# Patient Record
Sex: Male | Born: 1935 | Race: White | Hispanic: No | Marital: Married | Smoking: Former smoker
Health system: Southern US, Community
[De-identification: ages and names within clinical notes are randomized; demographics above are authoritative.]

## PROBLEM LIST (undated history)

## (undated) DIAGNOSIS — K5100 Ulcerative (chronic) pancolitis without complications: Secondary | ICD-10-CM

## (undated) DIAGNOSIS — B259 Cytomegaloviral disease, unspecified: Secondary | ICD-10-CM

## (undated) DIAGNOSIS — E1165 Type 2 diabetes mellitus with hyperglycemia: Secondary | ICD-10-CM

## (undated) DIAGNOSIS — K219 Gastro-esophageal reflux disease without esophagitis: Secondary | ICD-10-CM

## (undated) DIAGNOSIS — Z9289 Personal history of other medical treatment: Secondary | ICD-10-CM

## (undated) DIAGNOSIS — I10 Essential (primary) hypertension: Secondary | ICD-10-CM

## (undated) DIAGNOSIS — R51 Headache: Secondary | ICD-10-CM

## (undated) DIAGNOSIS — R5383 Other fatigue: Secondary | ICD-10-CM

## (undated) DIAGNOSIS — IMO0001 Reserved for inherently not codable concepts without codable children: Secondary | ICD-10-CM

## (undated) DIAGNOSIS — D696 Thrombocytopenia, unspecified: Secondary | ICD-10-CM

## (undated) DIAGNOSIS — G4733 Obstructive sleep apnea (adult) (pediatric): Secondary | ICD-10-CM

## (undated) DIAGNOSIS — I499 Cardiac arrhythmia, unspecified: Secondary | ICD-10-CM

## (undated) DIAGNOSIS — R0600 Dyspnea, unspecified: Secondary | ICD-10-CM

## (undated) DIAGNOSIS — E782 Mixed hyperlipidemia: Secondary | ICD-10-CM

## (undated) DIAGNOSIS — Z950 Presence of cardiac pacemaker: Secondary | ICD-10-CM

## (undated) DIAGNOSIS — J849 Interstitial pulmonary disease, unspecified: Secondary | ICD-10-CM

## (undated) DIAGNOSIS — R5381 Other malaise: Secondary | ICD-10-CM

## (undated) DIAGNOSIS — Z860100 Personal history of colon polyps, unspecified: Secondary | ICD-10-CM

## (undated) DIAGNOSIS — A0839 Other viral enteritis: Secondary | ICD-10-CM

## (undated) HISTORY — DX: Reserved for inherently not codable concepts without codable children: IMO0001

## (undated) HISTORY — DX: Other fatigue: R53.83

## (undated) HISTORY — DX: Mixed hyperlipidemia: E78.2

## (undated) HISTORY — DX: Essential (primary) hypertension: I10

## (undated) HISTORY — DX: Headache: R51

## (undated) HISTORY — DX: Other malaise: R53.81

## (undated) HISTORY — DX: Obstructive sleep apnea (adult) (pediatric): G47.33

## (undated) HISTORY — DX: Ulcerative (chronic) pancolitis without complications: K51.00

## (undated) HISTORY — DX: Personal history of colon polyps, unspecified: Z86.0100

## (undated) HISTORY — DX: Type 2 diabetes mellitus with hyperglycemia: E11.65

## (undated) HISTORY — DX: Personal history of other medical treatment: Z92.89

## (undated) HISTORY — DX: Personal history of colonic polyps: Z86.010

---
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
## 1995-11-11 HISTORY — PX: HERNIA REPAIR: SHX51

## 1999-11-11 HISTORY — PX: ANKLE SURGERY: SHX546

## 1999-11-11 HISTORY — PX: CHOLECYSTECTOMY: SHX55

## 2008-02-10 ENCOUNTER — Ambulatory Visit: Payer: Self-pay | Admitting: Cardiology

## 2009-11-10 LAB — HM DIABETES FOOT EXAM

## 2009-11-10 LAB — HM DIABETES EYE EXAM

## 2011-01-01 ENCOUNTER — Ambulatory Visit (INDEPENDENT_AMBULATORY_CARE_PROVIDER_SITE_OTHER): Payer: Medicare Other | Admitting: Internal Medicine

## 2011-01-01 DIAGNOSIS — K5190 Ulcerative colitis, unspecified, without complications: Secondary | ICD-10-CM

## 2011-07-18 ENCOUNTER — Telehealth: Payer: Self-pay | Admitting: Endocrinology

## 2011-07-18 NOTE — Telephone Encounter (Signed)
Received copies from Dr. Quillian Quince at [facility]. on 07/18/2011. Forwarded  7pages to Dr. Loanne Drilling for review.

## 2011-08-18 ENCOUNTER — Ambulatory Visit (INDEPENDENT_AMBULATORY_CARE_PROVIDER_SITE_OTHER): Payer: Medicare Other | Admitting: Endocrinology

## 2011-08-18 ENCOUNTER — Encounter: Payer: Self-pay | Admitting: Endocrinology

## 2011-08-18 ENCOUNTER — Ambulatory Visit (INDEPENDENT_AMBULATORY_CARE_PROVIDER_SITE_OTHER)
Admission: RE | Admit: 2011-08-18 | Discharge: 2011-08-18 | Disposition: A | Discharge status: Home / Self Care | Payer: Medicare Other | Source: Ambulatory Visit | Attending: Endocrinology | Admitting: Endocrinology

## 2011-08-18 DIAGNOSIS — R0989 Other specified symptoms and signs involving the circulatory and respiratory systems: Secondary | ICD-10-CM

## 2011-08-18 DIAGNOSIS — R0689 Other abnormalities of breathing: Secondary | ICD-10-CM | POA: Insufficient documentation

## 2011-08-18 DIAGNOSIS — F068 Other specified mental disorders due to known physiological condition: Secondary | ICD-10-CM | POA: Insufficient documentation

## 2011-08-18 DIAGNOSIS — F0789 Other personality and behavioral disorders due to known physiological condition: Secondary | ICD-10-CM
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
## 2011-08-18 NOTE — Patient Instructions (Addendum)
good diet and exercise habits significanly improve the control of your diabetes.  please keep your appointment with the dietician.  high blood sugar is very risky to your health.  you should see an eye doctor every year.  Weight loss helps your liver, also.   controlling your blood pressure and cholesterol drastically reduces the damage diabetes does to your body.  this also applies to quitting smoking.  please discuss these with your doctor.  you should take an aspirin every day, unless you have been advised by a doctor not to. check your blood sugar 1 time a day.  vary the time of day when you check, between before the 3 meals, and at bedtime.  also check if you have symptoms of your blood sugar being too high or too low.  please keep a record of the readings and bring it to your next appointment here.  please call us sooner if you are having low blood sugar episodes, or if it stays over 200. For now, continue the same metformin.   A chest x-ray is being requested for you today.  please call [phone] to hear your test results.  You will be prompted to enter the 9-digit "MRN" number that appears at the top left of this page, followed by #.  Then you will hear the message.  Please come back for a follow-up appointment in 3 months.   Your testosterone is only mildly low.  We could check blood tests to evaluate it further, but you could also just recheck it in the future. (update: i left message on phone-tree:  You should see your lung specialist, to f/u abnl cxr).

## 2011-08-20 ENCOUNTER — Encounter: Payer: Self-pay | Admitting: Endocrinology

## 2011-08-20 DIAGNOSIS — K5100 Ulcerative (chronic) pancolitis without complications: Secondary | ICD-10-CM | POA: Insufficient documentation

## 2011-08-20 DIAGNOSIS — E782 Mixed hyperlipidemia: Secondary | ICD-10-CM | POA: Insufficient documentation

## 2011-08-20 DIAGNOSIS — I10 Essential (primary) hypertension: Secondary | ICD-10-CM | POA: Insufficient documentation

## 2011-08-20 DIAGNOSIS — E1169 Type 2 diabetes mellitus with other specified complication: Secondary | ICD-10-CM | POA: Insufficient documentation

## 2011-11-18 ENCOUNTER — Encounter: Payer: Self-pay | Admitting: Endocrinology

## 2011-11-18 ENCOUNTER — Ambulatory Visit (INDEPENDENT_AMBULATORY_CARE_PROVIDER_SITE_OTHER): Payer: Medicare Other | Admitting: Endocrinology

## 2011-11-18 NOTE — Patient Instructions (Addendum)
check your blood sugar 1 time a day.  vary the time of day when you check, between before the 3 meals, and at bedtime.  also check if you have symptoms of your blood sugar being too high or too low.  please keep a record of the readings and bring it to your next appointment here.  please call us sooner if you are having low blood sugar episodes, or if it stays over 200. For now, continue metformin 1000 mg daily.   Please come back for a follow-up appointment in 6 months.   Losing weight, or taking the diabetes medication "actos" will heal your liver.   Please inquire at our x-ray dept about getting a CD of the chest-x-ray you had the last time you were here. Cc dr byrd ([facility]).  Here are some samples of "staxyin," to take as needed for ED symptoms.
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
## 2011-11-21 DIAGNOSIS — E119 Type 2 diabetes mellitus without complications: Secondary | ICD-10-CM | POA: Diagnosis not present

## 2012-03-02 DIAGNOSIS — N400 Enlarged prostate without lower urinary tract symptoms: Secondary | ICD-10-CM | POA: Diagnosis not present

## 2012-03-02 DIAGNOSIS — I10 Essential (primary) hypertension: Secondary | ICD-10-CM | POA: Diagnosis not present

## 2012-03-02 DIAGNOSIS — E782 Mixed hyperlipidemia: Secondary | ICD-10-CM | POA: Diagnosis not present

## 2012-03-02 DIAGNOSIS — IMO0001 Reserved for inherently not codable concepts without codable children: Secondary | ICD-10-CM | POA: Diagnosis not present

## 2012-03-02 DIAGNOSIS — R5381 Other malaise: Secondary | ICD-10-CM | POA: Diagnosis not present

## 2012-03-09 DIAGNOSIS — G4733 Obstructive sleep apnea (adult) (pediatric): Secondary | ICD-10-CM | POA: Diagnosis not present

## 2012-03-09 DIAGNOSIS — R5383 Other fatigue: Secondary | ICD-10-CM | POA: Diagnosis not present

## 2012-03-09 DIAGNOSIS — E119 Type 2 diabetes mellitus without complications: Secondary | ICD-10-CM | POA: Diagnosis not present

## 2012-03-09 DIAGNOSIS — I10 Essential (primary) hypertension: Secondary | ICD-10-CM | POA: Diagnosis not present

## 2012-03-09 DIAGNOSIS — E782 Mixed hyperlipidemia: Secondary | ICD-10-CM | POA: Diagnosis not present

## 2012-03-09 DIAGNOSIS — K5100 Ulcerative (chronic) pancolitis without complications: Secondary | ICD-10-CM | POA: Diagnosis not present

## 2012-03-13 DIAGNOSIS — B079 Viral wart, unspecified: Secondary | ICD-10-CM | POA: Diagnosis not present

## 2012-04-07 ENCOUNTER — Encounter (INDEPENDENT_AMBULATORY_CARE_PROVIDER_SITE_OTHER): Payer: Self-pay | Admitting: Internal Medicine

## 2012-04-07 ENCOUNTER — Ambulatory Visit (INDEPENDENT_AMBULATORY_CARE_PROVIDER_SITE_OTHER): Payer: Medicare Other | Admitting: Internal Medicine

## 2012-04-07 VITALS — BP 114/60 | HR 72 | Temp 98.1°F | Ht 72.0 in | Wt 208.5 lb

## 2012-04-07 DIAGNOSIS — K5190 Ulcerative colitis, unspecified, without complications: Secondary | ICD-10-CM | POA: Diagnosis not present

## 2012-04-07 MED ORDER — MESALAMINE 1000 MG RE SUPP: 1000.0000 mg | Freq: Every day | RECTAL | Status: DC

## 2012-04-07 NOTE — Patient Instructions (Addendum)
Canasa supp 1 gm x 30 day.  OV 1 yr. PR in 2 weeks.  CBC, sedrate today
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
## 2012-04-08 LAB — CBC WITH DIFFERENTIAL/PLATELET
Basophils Absolute: 0.1 10*3/uL (ref 0.0–0.1)
Basophils Relative: 1 % (ref 0–1)
Eosinophils Absolute: 0.1 10*3/uL (ref 0.0–0.7)
MCH: 31.9 pg (ref 26.0–34.0)
MCHC: 34.4 g/dL (ref 30.0–36.0)
Monocytes Absolute: 0.7 10*3/uL (ref 0.1–1.0)
Neutro Abs: 3 10*3/uL (ref 1.7–7.7)
Neutrophils Relative %: 51 % (ref 43–77)
RDW: 13.3 % (ref 11.5–15.5)

## 2012-04-08 LAB — C-REACTIVE PROTEIN: CRP: 0.23 mg/dL (ref <0.60)

## 2012-04-13 ENCOUNTER — Telehealth (INDEPENDENT_AMBULATORY_CARE_PROVIDER_SITE_OTHER): Payer: Self-pay | Admitting: Internal Medicine

## 2012-04-13 DIAGNOSIS — K5120 Ulcerative (chronic) proctitis without complications: Secondary | ICD-10-CM

## 2012-04-13 MED ORDER — MESALAMINE 1.2 G PO TBEC: DELAYED_RELEASE_TABLET | ORAL | Status: DC

## 2012-04-13 NOTE — Telephone Encounter (Signed)
Please see Rx

## 2012-04-19 DIAGNOSIS — E1149 Type 2 diabetes mellitus with other diabetic neurological complication: Secondary | ICD-10-CM | POA: Diagnosis not present

## 2012-04-19 DIAGNOSIS — E119 Type 2 diabetes mellitus without complications: Secondary | ICD-10-CM | POA: Diagnosis not present

## 2012-04-28 DIAGNOSIS — R0602 Shortness of breath: Secondary | ICD-10-CM | POA: Diagnosis not present

## 2012-04-28 DIAGNOSIS — G4733 Obstructive sleep apnea (adult) (pediatric): Secondary | ICD-10-CM | POA: Diagnosis not present

## 2012-05-18 ENCOUNTER — Encounter (INDEPENDENT_AMBULATORY_CARE_PROVIDER_SITE_OTHER): Payer: Self-pay | Admitting: Internal Medicine

## 2012-05-18 ENCOUNTER — Ambulatory Visit (INDEPENDENT_AMBULATORY_CARE_PROVIDER_SITE_OTHER): Payer: Medicare Other | Admitting: Internal Medicine

## 2012-05-18 ENCOUNTER — Ambulatory Visit (INDEPENDENT_AMBULATORY_CARE_PROVIDER_SITE_OTHER): Payer: Medicare Other | Admitting: Endocrinology

## 2012-05-18 ENCOUNTER — Other Ambulatory Visit (INDEPENDENT_AMBULATORY_CARE_PROVIDER_SITE_OTHER): Payer: Self-pay | Admitting: *Deleted

## 2012-05-18 ENCOUNTER — Encounter: Payer: Self-pay | Admitting: Endocrinology

## 2012-05-18 ENCOUNTER — Telehealth (INDEPENDENT_AMBULATORY_CARE_PROVIDER_SITE_OTHER): Payer: Self-pay | Admitting: *Deleted

## 2012-05-18 VITALS — BP 112/70 | HR 60 | Temp 98.6°F | Ht 70.0 in | Wt 208.7 lb

## 2012-05-18 VITALS — BP 112/68 | HR 77 | Temp 97.7°F | Ht 72.0 in | Wt 210.0 lb

## 2012-05-18 DIAGNOSIS — K5190 Ulcerative colitis, unspecified, without complications: Secondary | ICD-10-CM

## 2012-05-18 DIAGNOSIS — K5120 Ulcerative (chronic) proctitis without complications: Secondary | ICD-10-CM | POA: Diagnosis not present

## 2012-05-18 DIAGNOSIS — Z1211 Encounter for screening for malignant neoplasm of colon: Secondary | ICD-10-CM

## 2012-05-18 MED ORDER — SULFASALAZINE 500 MG PO TABS: 1000.0000 mg | ORAL_TABLET | Freq: Two times a day (BID) | ORAL | Status: DC

## 2012-05-18 MED ORDER — PEG-KCL-NACL-NASULF-NA ASC-C 100 G PO SOLR: 1.0000 | Freq: Once | ORAL | Status: DC

## 2012-05-18 NOTE — Patient Instructions (Addendum)
good diet and exercise habits significanly improve the control of your diabetes.  please let me know if you wish to be referred to a dietician.  high blood sugar is very risky to your health.  you should see an eye doctor every year. controlling your blood pressure and cholesterol drastically reduces the damage diabetes does to your body.  this also applies to quitting smoking.  please discuss these with your doctor.  you should take an aspirin every day, unless you have been advised by a doctor not to. check your blood sugar once a day.  vary the time of day when you check, between before the 3 meals, and at bedtime.  also check if you have symptoms of your blood sugar being too high or too low.  please keep a record of the readings and bring it to your next appointment here.  please call us sooner if your blood sugar goes below 70, or if it stays over 200. Please continue the same metformin.   Please come back for a follow-up appointment in 6 months

## 2012-05-18 NOTE — Telephone Encounter (Signed)
Patient needs movi prep 

## 2012-05-18 NOTE — Patient Instructions (Addendum)
Start Sulfasalazine 54m. 1gm BID. Folic acid 1 mg daily. Will schedule a colonoscopy with Dr. RLaural Golden
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
## 2012-05-18 NOTE — Progress Notes (Signed)
  Subjective:    Patient ID: Manuel Atkins, male    DOB: 31-Oct-1933, 78 y.o.   MRN: [id number]  HPI Pt returns for f/u of tyoe 2 DM (dx'ed 8295; no known complications).  He takes metformin as rx'ed, but it was reduced to 1000 mg qd.  pt states he feels well in general.  he brings a record of his cbg's which i have reviewed today.  It varies from 86-133.  There is no trend throughout the day.   Review of Systems Denies weight change    Objective:   Physical Exam EXTEMITIES: no deformity.  no ulcer on the feet.  feet are of normal color and temp.  1+ bilat leg edema.  There is bilateral onychomycosis.  There is a healed surgical scar at the right ankle.  There are bilat varicosities.  There is bilat rust-colored hyperpigmentation of the leg and feet.  Both great toenails are surgically absent.   PULSES: dorsalis pedis intact bilat.  NEURO:  sensation is intact to touch on the feet.   outside test results are reviewed: A1c=5.8    Assessment & Plan:  DM is well-controlled

## 2012-05-20 ENCOUNTER — Encounter (HOSPITAL_COMMUNITY): Payer: Self-pay | Admitting: Pharmacy Technician

## 2012-05-24 ENCOUNTER — Telehealth (INDEPENDENT_AMBULATORY_CARE_PROVIDER_SITE_OTHER): Payer: Self-pay | Admitting: Internal Medicine

## 2012-05-24 NOTE — Telephone Encounter (Signed)
Rx called to [facility] Hydrocortisone enemas 121m x 14 days. No refills. I could not find this drug to eprescribe.

## 2012-06-02 MED ORDER — SODIUM CHLORIDE 0.45 % IV SOLN
Freq: Once | INTRAVENOUS | Status: AC
  Administered 2012-06-03: 12:00:00 via INTRAVENOUS

## 2012-06-03 ENCOUNTER — Encounter (HOSPITAL_COMMUNITY)
Admission: RE | Disposition: A | Discharge status: Home / Self Care | Payer: Self-pay | Source: Ambulatory Visit | Attending: Internal Medicine

## 2012-06-03 ENCOUNTER — Ambulatory Visit (HOSPITAL_COMMUNITY)
Admission: RE | Admit: 2012-06-03 | Discharge: 2012-06-03 | Disposition: A | Discharge status: Home / Self Care | Payer: Medicare Other | Source: Ambulatory Visit | Attending: Internal Medicine | Admitting: Internal Medicine

## 2012-06-03 ENCOUNTER — Encounter (HOSPITAL_COMMUNITY): Payer: Self-pay | Admitting: *Deleted

## 2012-06-03 DIAGNOSIS — K5730 Diverticulosis of large intestine without perforation or abscess without bleeding: Secondary | ICD-10-CM | POA: Insufficient documentation

## 2012-06-03 DIAGNOSIS — K5190 Ulcerative colitis, unspecified, without complications: Secondary | ICD-10-CM | POA: Diagnosis not present

## 2012-06-03 DIAGNOSIS — K5130 Ulcerative (chronic) rectosigmoiditis without complications: Secondary | ICD-10-CM

## 2012-06-03 DIAGNOSIS — D126 Benign neoplasm of colon, unspecified: Secondary | ICD-10-CM | POA: Diagnosis not present

## 2012-06-03 DIAGNOSIS — E785 Hyperlipidemia, unspecified: Secondary | ICD-10-CM | POA: Insufficient documentation

## 2012-06-03 DIAGNOSIS — K5180 Other ulcerative colitis without complications: Secondary | ICD-10-CM | POA: Insufficient documentation

## 2012-06-03 DIAGNOSIS — K921 Melena: Secondary | ICD-10-CM

## 2012-06-03 DIAGNOSIS — G4733 Obstructive sleep apnea (adult) (pediatric): Secondary | ICD-10-CM | POA: Diagnosis not present

## 2012-06-03 DIAGNOSIS — Z79899 Other long term (current) drug therapy: Secondary | ICD-10-CM | POA: Insufficient documentation

## 2012-06-03 DIAGNOSIS — I10 Essential (primary) hypertension: Secondary | ICD-10-CM | POA: Diagnosis not present

## 2012-06-03 DIAGNOSIS — K644 Residual hemorrhoidal skin tags: Secondary | ICD-10-CM | POA: Diagnosis not present

## 2012-06-03 HISTORY — PX: COLONOSCOPY: SHX5424

## 2012-06-03 LAB — GLUCOSE, CAPILLARY: Glucose-Capillary: 88 mg/dL (ref 70–99)

## 2012-06-03 SURGERY — COLONOSCOPY
Anesthesia: Moderate Sedation

## 2012-06-03 MED ORDER — MEPERIDINE HCL 50 MG/ML IJ SOLN
INTRAMUSCULAR | Status: DC | PRN
  Administered 2012-06-03: 25 mg via INTRAVENOUS

## 2012-06-03 MED ORDER — MEPERIDINE HCL 50 MG/ML IJ SOLN
INTRAMUSCULAR | Status: AC
  Filled 2012-06-03: qty 1

## 2012-06-03 MED ORDER — STERILE WATER FOR IRRIGATION IR SOLN
Status: DC | PRN
  Administered 2012-06-03: 13:00:00

## 2012-06-03 MED ORDER — MIDAZOLAM HCL 5 MG/5ML IJ SOLN
INTRAMUSCULAR | Status: AC
  Filled 2012-06-03: qty 10

## 2012-06-03 MED ORDER — MIDAZOLAM HCL 5 MG/5ML IJ SOLN
INTRAMUSCULAR | Status: DC | PRN
  Administered 2012-06-03: 1 mg via INTRAVENOUS
  Administered 2012-06-03: 2 mg via INTRAVENOUS

## 2012-06-03 MED ORDER — SULFASALAZINE 500 MG PO TABS: 1000.0000 mg | ORAL_TABLET | Freq: Three times a day (TID) | ORAL | Status: DC
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
## 2012-06-03 NOTE — Op Note (Signed)
COLONOSCOPY PROCEDURE REPORT  PATIENT:  Manuel Atkins  MR#:  [id number] Birthdate:  05/27/1933, 78 y.o., male Endoscopist:  Dr. Rogene Houston, MD Referred By:  Dr. Gar Ponto, MD Procedure Date: 06/03/2012  Procedure:   Colonoscopy  Indications:  Patient is 78 year old Caucasian male with over 74 year history of ulcerative colitis was been treated for flareup but symptoms have not improved. Patient's last colonoscopy was 5 years ago. He is undergoing diagnostic/surveillance colonoscopy.  Informed Consent:  The procedure and risks were reviewed with the patient and informed consent was obtained.  Medications:  Demerol 25 mg IV Versed 3 mg IV  Description of procedure:  After a digital rectal exam was performed, that colonoscope was advanced from the anus through the rectum and colon to the area of the cecum, ileocecal valve and appendiceal orifice. The cecum was deeply intubated. These structures were well-seen and photographed for the record. From the level of the cecum and ileocecal valve, the scope was slowly and cautiously withdrawn. The mucosal surfaces were carefully surveyed utilizing scope tip to flexion to facilitate fold flattening as needed. The scope was pulled down into the rectum where a thorough exam including retroflexion was performed. Terminal ileum was also examined.  Findings:   Prep excellent. Normal terminal ileum. Few scattered diverticula at sigmoid and transverse colon. Normal lung and mucosa with transition at 32 cm from the anal margin. 2 large patches with erythematous friable mucosa with erosions at sigmoid colon. Distally there is circumferential involvement of active disease. Double biopsies taken. Small polyp ablated via cold biopsy from sigmoid colon. Normal rectal mucosa. Mall hemorrhoids below the dentate line.  Therapeutic/Diagnostic Maneuvers Performed:  See above  Complications:  None  Cecal Withdrawal Time:  11 minutes  Impression:  Normal  terminal ileum. Active colitis involving distal sigmoid colon with patchy involvement. Multiple biopsies taken. Small sigmoid colon polyp ablated via cold biopsy. Scattered diverticula at sigmoid and transverse colon. Small external hemorrhoids.  Recommendations:  Increase Azulfidine to 1 g by mouth 3 times a day. Continue Hydrocort enemas until finished. On contact patient with biopsy results and further recommendations.  Elyn Krogh U  06/03/2012 1:02 PM  CC: Dr. Gar Ponto, MD & Dr. Rayne Du ref. provider found

## 2012-06-08 ENCOUNTER — Encounter (HOSPITAL_COMMUNITY): Payer: Self-pay | Admitting: Internal Medicine

## 2012-06-14 ENCOUNTER — Other Ambulatory Visit (INDEPENDENT_AMBULATORY_CARE_PROVIDER_SITE_OTHER): Payer: Self-pay | Admitting: Internal Medicine

## 2012-06-16 ENCOUNTER — Encounter (INDEPENDENT_AMBULATORY_CARE_PROVIDER_SITE_OTHER): Payer: Self-pay | Admitting: *Deleted

## 2012-07-05 ENCOUNTER — Telehealth (INDEPENDENT_AMBULATORY_CARE_PROVIDER_SITE_OTHER): Payer: Self-pay | Admitting: Internal Medicine

## 2012-07-05 NOTE — Telephone Encounter (Signed)
C/o passing mucous and blood with his stools. He has had 2 rounds of Hydrocortisone enemas x 14 each. No change in symptoms.  I spoke with Dr. Laural Golden concerning patient.  Will start on Prednisone 10m x 2 weeks, 112mx 1 week, 108m 1 week, 5 mg x 1 week.      Manuel Atkins OV in 5 weeks.

## 2012-07-05 NOTE — Telephone Encounter (Signed)
Has an apt scheduled for 08/10/12 at 11:30 with Dr. Laural Golden. F/U from procedure.

## 2012-07-07 DIAGNOSIS — E782 Mixed hyperlipidemia: Secondary | ICD-10-CM | POA: Diagnosis not present

## 2012-07-07 DIAGNOSIS — IMO0001 Reserved for inherently not codable concepts without codable children: Secondary | ICD-10-CM | POA: Diagnosis not present

## 2012-07-07 DIAGNOSIS — I10 Essential (primary) hypertension: Secondary | ICD-10-CM | POA: Diagnosis not present

## 2012-07-14 DIAGNOSIS — E782 Mixed hyperlipidemia: Secondary | ICD-10-CM | POA: Diagnosis not present

## 2012-07-14 DIAGNOSIS — E119 Type 2 diabetes mellitus without complications: Secondary | ICD-10-CM | POA: Diagnosis not present

## 2012-07-14 DIAGNOSIS — R5381 Other malaise: Secondary | ICD-10-CM | POA: Diagnosis not present

## 2012-07-14 DIAGNOSIS — I10 Essential (primary) hypertension: Secondary | ICD-10-CM | POA: Diagnosis not present

## 2012-07-14 DIAGNOSIS — G4733 Obstructive sleep apnea (adult) (pediatric): Secondary | ICD-10-CM | POA: Diagnosis not present

## 2012-07-14 DIAGNOSIS — R5383 Other fatigue: Secondary | ICD-10-CM | POA: Diagnosis not present

## 2012-07-14 DIAGNOSIS — K5100 Ulcerative (chronic) pancolitis without complications: Secondary | ICD-10-CM | POA: Diagnosis not present

## 2012-08-10 ENCOUNTER — Ambulatory Visit (INDEPENDENT_AMBULATORY_CARE_PROVIDER_SITE_OTHER): Payer: Medicare Other | Admitting: Internal Medicine

## 2012-08-10 ENCOUNTER — Encounter (INDEPENDENT_AMBULATORY_CARE_PROVIDER_SITE_OTHER): Payer: Self-pay | Admitting: Internal Medicine

## 2012-08-10 ENCOUNTER — Other Ambulatory Visit (INDEPENDENT_AMBULATORY_CARE_PROVIDER_SITE_OTHER): Payer: Self-pay | Admitting: Internal Medicine

## 2012-08-10 VITALS — BP 130/76 | HR 82 | Temp 97.6°F | Resp 18 | Ht 72.0 in | Wt 205.1 lb

## 2012-08-10 DIAGNOSIS — K5190 Ulcerative colitis, unspecified, without complications: Secondary | ICD-10-CM

## 2012-08-10 MED ORDER — MESALAMINE ER 0.375 G PO CP24: 375.0000 mg | ORAL_CAPSULE | Freq: Every day | ORAL | Status: DC

## 2012-08-10 MED ORDER — MESALAMINE 800 MG PO TBEC: 1600.0000 mg | DELAYED_RELEASE_TABLET | Freq: Two times a day (BID) | ORAL | Status: DC

## 2012-08-10 MED ORDER — BUDESONIDE 9 MG PO TB24: 9.0000 mg | ORAL_TABLET | Freq: Every day | ORAL | Status: DC

## 2012-08-10 NOTE — Progress Notes (Signed)
Presenting complaint;  Followup for ulcerative colitis.  Subjective:  Patient is 78 year old Caucasian male who has over 30 year history of ulcerative colitis. He has been maintained on sulfasalazine and has done well. His last colonoscopy was in 2008 revealing mild proctitis. He presented in May,2013 with intermittent rectal bleeding. He was treated with Canasa suppositories daily for one month. Rectal bleeding did not stop. He also did not respond to 2 weeks of cort enemas. He underwent colonoscopy on 06/03/2012 which revealed normal terminal ileum active colitis primarily involving distal sigmoid and rectum. He also had diverticula at sigmoid colon and rectum. Biopsy from sigmoid colon revealed typical changes of ulcerative colitis. Sulfasalazine dose was increased. He felt no better after one month. He was therefore begun on prednisone about 5 weeks ago. For a few days he thought his bleeding has stopped but then he was back to square one. He did not like the way he felt while on prednisone as it made him very nervous. He is having 2-3 stools per day. Consistency  Is usually soft. He is passing blood per him treated 4 times each week. He is also having nocturnal bowel movement. At times he passes gas and blood per rectum at night. He denies abdominal pain nausea vomiting fever or chills. He does not take NSAIDs.   Current Medications: Current Outpatient Prescriptions  Medication Sig Dispense Refill  . aspirin EC 81 MG tablet Take 81 mg by mouth daily.      . folic acid (FOLVITE) 1 MG tablet Take 1 mg by mouth daily.      . metFORMIN (GLUCOPHAGE-XR) 500 MG 24 hr tablet Take 500 mg by mouth daily.       Marland Kitchen sulfaSALAzine (AZULFIDINE) 500 MG tablet Take 2 tablets (1,000 mg total) by mouth 3 (three) times daily.  180 tablet  11  . valsartan (DIOVAN) 80 MG tablet Take 80 mg by mouth daily.           Objective: Blood pressure 130/76, pulse 82, temperature 97.6 F (36.4 C), temperature source  Oral, resp. rate 18, height 6' (1.829 m), weight 205 lb 1.6 oz (93.033 kg). Patient is alert and in no acute distress. Conjunctiva is pink. Sclera is nonicteric Oropharyngeal mucosa is normal. No neck masses or thyromegaly noted. Cardiac exam with regular rhythm normal S1 and S2. No murmur or gallop noted. Lungs are clear to auscultation. Abdomen is full but soft and nontender without organomegaly or masses. No LE edema or clubbing noted.  Labs/studies Results: CBC on 04/07/2012. WBC 6.0, H&H 14.5 and 42.2 and platelet count 132K. CRP was 0.23.   Assessment:  Refractory distal ulcerative colitis. He has not responded to sulfasalazine, hydrocortisone enemas as well as to prednisone. He does not appear to be acutely ill. He may eventually need to be on 6 MP but first will try the following.   Plan:  Discontinue sulfasalazine and folic acid. Asacol HD 1.6 g by mouth twice a day. Uceris 9 mg by mouth daily for 8 weeks. Office visit in 8 weeks unless symptoms progress.

## 2012-08-10 NOTE — Patient Instructions (Signed)
Notify if symptoms get worse or he have side effects with medications.

## 2012-08-12 DIAGNOSIS — Z23 Encounter for immunization: Secondary | ICD-10-CM | POA: Diagnosis not present

## 2012-08-16 DIAGNOSIS — E119 Type 2 diabetes mellitus without complications: Secondary | ICD-10-CM | POA: Diagnosis not present

## 2012-08-16 DIAGNOSIS — E1149 Type 2 diabetes mellitus with other diabetic neurological complication: Secondary | ICD-10-CM | POA: Diagnosis not present

## 2012-08-30 ENCOUNTER — Telehealth (INDEPENDENT_AMBULATORY_CARE_PROVIDER_SITE_OTHER): Payer: Self-pay | Admitting: *Deleted

## 2012-08-30 NOTE — Telephone Encounter (Signed)
Manuel Atkins called and states that he saw Dr.Rehman and he was given  Uceris, Apriso., Asacol. He states that with these medications his condition is no better.  He wanted Dr.Rehman to know. His contact number is [phone]

## 2012-08-31 NOTE — Telephone Encounter (Signed)
I talked with patient and his wife yesterday. He is continuing to have rectal bleeding and diarrhea.  He needs to be seen in the office in 2 weeks to review treatment options.

## 2012-09-06 NOTE — Telephone Encounter (Signed)
Apt has been schedule for 09/13/12 with Dr. Laural Golden.

## 2012-09-13 ENCOUNTER — Ambulatory Visit (INDEPENDENT_AMBULATORY_CARE_PROVIDER_SITE_OTHER): Payer: Medicare Other | Admitting: Internal Medicine

## 2012-09-13 ENCOUNTER — Encounter (INDEPENDENT_AMBULATORY_CARE_PROVIDER_SITE_OTHER): Payer: Self-pay | Admitting: Internal Medicine

## 2012-09-13 ENCOUNTER — Other Ambulatory Visit: Payer: Self-pay | Admitting: Cardiology

## 2012-09-13 VITALS — BP 122/76 | HR 80 | Temp 97.4°F | Resp 18 | Ht 72.0 in | Wt 203.9 lb

## 2012-09-13 DIAGNOSIS — R7989 Other specified abnormal findings of blood chemistry: Secondary | ICD-10-CM

## 2012-09-13 DIAGNOSIS — K5120 Ulcerative (chronic) proctitis without complications: Secondary | ICD-10-CM

## 2012-09-13 DIAGNOSIS — K5190 Ulcerative colitis, unspecified, without complications: Secondary | ICD-10-CM | POA: Diagnosis not present

## 2012-09-13 LAB — CBC
HCT: 44.9 % (ref 39.0–52.0)
Hemoglobin: 15.5 g/dL (ref 13.0–17.0)
MCH: 33.2 pg (ref 26.0–34.0)
MCHC: 34.5 g/dL (ref 30.0–36.0)
MCV: 96.1 fL (ref 78.0–100.0)
Platelets: 183 K/uL (ref 150–400)
RBC: 4.67 MIL/uL (ref 4.22–5.81)
RDW: 13.3 % (ref 11.5–15.5)
WBC: 6.9 K/uL (ref 4.0–10.5)

## 2012-09-13 LAB — ALT: ALT: 36 U/L (ref 0–53)

## 2012-09-13 LAB — AST: AST: 25 U/L (ref 0–37)

## 2012-09-13 MED ORDER — MERCAPTOPURINE 50 MG PO TABS: 100.0000 mg | ORAL_TABLET | Freq: Every day | ORAL | Status: DC

## 2012-09-13 NOTE — Progress Notes (Signed)
Presenting complaint;  Followup for UC.  Subjective:  Patient is 78 year old Caucasian male who presents with persistent symptoms of diarrhea urgency and rectal bleeding. He has chronic UC and began to have symptoms few months ago. He underwent colonoscopy on 06/03/2012 and he had active disease at sigmoid colon. Biopsies were negative for CMV colitis on his last visit he was begun on Uceris and switched to asacol HD. He is accompanied by his wife. He feels no better. He is having 4-5 bowel movements per day. He has urgency and has 1-2 accidents per week. He has seen blood with most of his bowel movements but small in amount. He denies abdominal pain fever chills nausea or vomiting. His appetite is normal.  Current Medications: Current Outpatient Prescriptions  Medication Sig Dispense Refill  . ASACOL HD 800 MG TBEC TAKE TWO TABLETS BY MOUTH TWICE DAILY  120 each  5  . aspirin EC 81 MG tablet Take 81 mg by mouth daily.      . Budesonide (UCERIS) 9 MG TB24 Take 9 mg by mouth daily.  30 tablet  1  . metFORMIN (GLUCOPHAGE-XR) 500 MG 24 hr tablet Take 500 mg by mouth daily.       . valsartan (DIOVAN) 80 MG tablet Take 80 mg by mouth daily.           Objective: Blood pressure 122/76, pulse 80, temperature 97.4 F (36.3 C), temperature source Oral, resp. rate 18, height 6' (1.829 m), weight 203 lb 14.4 oz (92.488 kg). Patient is alert and in no acute distress. Conjunctiva is pink. Sclera is nonicteric Oropharyngeal mucosa is normal. No neck masses or thyromegaly noted. Cardiac exam with regular rhythm normal S1 and S2. No murmur or gallop noted. Lungs are clear to auscultation. Abdomen. Bowel sounds are normal. Abdomen is soft and nontender without organomegaly or masses.  No LE edema or clubbing noted.  Labs/studies Results: Lab data from 04/07/2012. WBC 6.0, H&H 14.5 and 42.2 and platelet count was 132K.   Assessment:  #1. Ulcerative colitis. He has remained with mild to moderate  symptoms for a few months. He has not responded to higher dose of sulfasalazine, cortisone enemas and short course of prednisone and now he has been on Uceris for [redacted] weeks along with mesalamine. Treatment options discussed with patient which include higher dose of mesalamine, immunomodulators or biologic therapy. We discussed pros and cones of each therapy. He is agreeable to go on 6 MP. #2. Mild thrombocytopenia. #3. History of mildly elevated transaminases secondary to fatty  Plan:  Patient will go to the lab for CBC, AST and ALT levels. He would also have TPMT assay. Will start him on 6 MP 100 mg by mouth daily. He will continue mesalamine at current dose which is 3.2 g per day. Continue Uceris until prescription runs out. Presuming he is able to continue 6 MP he will have CBC in 4 weeks. Office visit in three-months.

## 2012-09-13 NOTE — Patient Instructions (Signed)
Continue uceris until prescription runs out. Continue Asacol HD at 1.6 g by mouth twice daily. 6 mercaptopurine 100 mg or 2 tablets by mouth daily. Physician will contact you with results of blood work. CBC with differential  in 4 weeks

## 2012-09-18 ENCOUNTER — Telehealth: Payer: Self-pay | Admitting: Gastroenterology

## 2012-09-18 NOTE — Telephone Encounter (Signed)
PT CALLED. TAKING UCERIS. STARTED 6-MP MON. NOW HAVING WORSENING DIARRHEA.  PT INSTRUCTED TO STOP 6-MP. CALL DR. Laural Golden ON MON. ASKED TO GO TO ED IF HE HAS FEVER, CHILLS, WORSENING ABD PAIN, OR RECTAL BLEEDING.

## 2012-09-20 NOTE — Telephone Encounter (Signed)
Talked with patient. He had 5 or 6 stools on Friday any talk with Dr. Oneida Alar. He did not experience fever nausea vomiting or skin rash. TPMT assay is not back yet. I told patient that his diarrhea may be related to something that he ate or do to UC and and possibly unrelated to 6-MP. Patient advised to go back on 6-MP but take 50 mg daily. Dose will be adjusted whenTPMT assay is back.

## 2012-09-21 ENCOUNTER — Telehealth (INDEPENDENT_AMBULATORY_CARE_PROVIDER_SITE_OTHER): Payer: Self-pay | Admitting: Internal Medicine

## 2012-09-21 NOTE — Telephone Encounter (Signed)
TMPT activity is intermediate. Patient will continue 6-MP at 50 mg by mouth daily. She will switch to [facility] which is covered by his plan. CBC in 3 weeks.

## 2012-09-22 ENCOUNTER — Telehealth (INDEPENDENT_AMBULATORY_CARE_PROVIDER_SITE_OTHER): Payer: Self-pay | Admitting: *Deleted

## 2012-09-22 DIAGNOSIS — K5190 Ulcerative colitis, unspecified, without complications: Secondary | ICD-10-CM

## 2012-09-22 NOTE — Telephone Encounter (Signed)
Patient's lab is noted for 10-13-12 He will be sent a letter as a reminder

## 2012-09-22 NOTE — Telephone Encounter (Signed)
Per Dr. Laural Golden the patient will need to have  CBC in 3 weeks

## 2012-09-24 ENCOUNTER — Emergency Department (HOSPITAL_COMMUNITY): Payer: Medicare Other

## 2012-09-24 ENCOUNTER — Other Ambulatory Visit: Payer: Self-pay

## 2012-09-24 ENCOUNTER — Encounter (HOSPITAL_COMMUNITY): Payer: Self-pay | Admitting: Emergency Medicine

## 2012-09-24 ENCOUNTER — Emergency Department (HOSPITAL_COMMUNITY)
Admission: EM | Admit: 2012-09-24 | Discharge: 2012-09-24 | Disposition: A | Discharge status: Home / Self Care | Payer: Medicare Other | Attending: Emergency Medicine | Admitting: Emergency Medicine

## 2012-09-24 DIAGNOSIS — K5100 Ulcerative (chronic) pancolitis without complications: Secondary | ICD-10-CM | POA: Diagnosis not present

## 2012-09-24 DIAGNOSIS — Z79899 Other long term (current) drug therapy: Secondary | ICD-10-CM | POA: Insufficient documentation

## 2012-09-24 DIAGNOSIS — E119 Type 2 diabetes mellitus without complications: Secondary | ICD-10-CM | POA: Diagnosis not present

## 2012-09-24 DIAGNOSIS — R197 Diarrhea, unspecified: Secondary | ICD-10-CM | POA: Diagnosis not present

## 2012-09-24 DIAGNOSIS — R5381 Other malaise: Secondary | ICD-10-CM | POA: Diagnosis not present

## 2012-09-24 DIAGNOSIS — I10 Essential (primary) hypertension: Secondary | ICD-10-CM | POA: Insufficient documentation

## 2012-09-24 DIAGNOSIS — R509 Fever, unspecified: Secondary | ICD-10-CM | POA: Insufficient documentation

## 2012-09-24 DIAGNOSIS — G4733 Obstructive sleep apnea (adult) (pediatric): Secondary | ICD-10-CM | POA: Diagnosis not present

## 2012-09-24 DIAGNOSIS — Z860100 Personal history of colon polyps, unspecified: Secondary | ICD-10-CM | POA: Insufficient documentation

## 2012-09-24 DIAGNOSIS — Z87891 Personal history of nicotine dependence: Secondary | ICD-10-CM | POA: Insufficient documentation

## 2012-09-24 DIAGNOSIS — E782 Mixed hyperlipidemia: Secondary | ICD-10-CM | POA: Insufficient documentation

## 2012-09-24 DIAGNOSIS — R0602 Shortness of breath: Secondary | ICD-10-CM | POA: Diagnosis not present

## 2012-09-24 DIAGNOSIS — R404 Transient alteration of awareness: Secondary | ICD-10-CM | POA: Diagnosis not present

## 2012-09-24 DIAGNOSIS — R5383 Other fatigue: Secondary | ICD-10-CM | POA: Insufficient documentation

## 2012-09-24 LAB — GLUCOSE, CAPILLARY: Glucose-Capillary: 116 mg/dL — ABNORMAL HIGH (ref 70–99)

## 2012-09-24 LAB — CBC WITH DIFFERENTIAL/PLATELET
Basophils Absolute: 0.1 10*3/uL (ref 0.0–0.1)
Basophils Relative: 1 % (ref 0–1)
Eosinophils Absolute: 0.4 10*3/uL (ref 0.0–0.7)
Eosinophils Relative: 5 % (ref 0–5)
HCT: 41.3 % (ref 39.0–52.0)
Hemoglobin: 14.4 g/dL (ref 13.0–17.0)
Lymphocytes Relative: 15 % (ref 12–46)
Lymphs Abs: 1.3 10*3/uL (ref 0.7–4.0)
MCH: 33.6 pg (ref 26.0–34.0)
MCHC: 34.9 g/dL (ref 30.0–36.0)
MCV: 96.3 fL (ref 78.0–100.0)
Monocytes Absolute: 1.3 10*3/uL — ABNORMAL HIGH (ref 0.1–1.0)
Monocytes Relative: 15 % — ABNORMAL HIGH (ref 3–12)
Neutro Abs: 5.7 10*3/uL (ref 1.7–7.7)
Neutrophils Relative %: 65 % (ref 43–77)
Platelets: 126 10*3/uL — ABNORMAL LOW (ref 150–400)
RBC: 4.29 MIL/uL (ref 4.22–5.81)
RDW: 13.5 % (ref 11.5–15.5)
WBC: 8.8 10*3/uL (ref 4.0–10.5)

## 2012-09-24 LAB — COMPREHENSIVE METABOLIC PANEL
Albumin: 3.6 g/dL (ref 3.5–5.2)
BUN: 20 mg/dL (ref 6–23)
Calcium: 9.2 mg/dL (ref 8.4–10.5)
Creatinine, Ser: 0.99 mg/dL (ref 0.50–1.35)
Potassium: 4.2 mEq/L (ref 3.5–5.1)
Total Protein: 6.9 g/dL (ref 6.0–8.3)

## 2012-09-24 LAB — URINALYSIS, ROUTINE W REFLEX MICROSCOPIC
Protein, ur: NEGATIVE mg/dL
Urobilinogen, UA: 0.2 mg/dL (ref 0.0–1.0)

## 2012-09-24 LAB — URINE MICROSCOPIC-ADD ON
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
## 2012-09-24 NOTE — ED Notes (Signed)
Pt/ c/o fever/weakness/general malaise/diarrhea x 3 days. Pt denies any pain.

## 2012-09-27 ENCOUNTER — Encounter (INDEPENDENT_AMBULATORY_CARE_PROVIDER_SITE_OTHER): Payer: Self-pay | Admitting: Internal Medicine

## 2012-09-27 ENCOUNTER — Telehealth (INDEPENDENT_AMBULATORY_CARE_PROVIDER_SITE_OTHER): Payer: Self-pay | Admitting: *Deleted

## 2012-09-27 ENCOUNTER — Ambulatory Visit (INDEPENDENT_AMBULATORY_CARE_PROVIDER_SITE_OTHER): Payer: Medicare Other | Admitting: Internal Medicine

## 2012-09-27 VITALS — BP 128/76 | HR 80 | Temp 97.3°F | Resp 18 | Ht 72.0 in | Wt 200.4 lb

## 2012-09-27 DIAGNOSIS — K5190 Ulcerative colitis, unspecified, without complications: Secondary | ICD-10-CM | POA: Diagnosis not present

## 2012-09-27 DIAGNOSIS — R509 Fever, unspecified: Secondary | ICD-10-CM

## 2012-09-27 MED ORDER — MESALAMINE ER 0.375 G PO CP24: 375.0000 mg | ORAL_CAPSULE | Freq: Every day | ORAL | Status: DC

## 2012-09-27 MED ORDER — MESALAMINE 4 G RE ENEM: 4.0000 g | ENEMA | Freq: Every day | RECTAL | Status: DC

## 2012-09-27 NOTE — Telephone Encounter (Signed)
  Per Dr.Rehman have the patient to come to the office now. Patient called and is on his way Butch Penny made aware

## 2012-09-27 NOTE — Progress Notes (Signed)
Presenting complaint;  Followup for ulcerative colitis.  Subjective:  This is on scheduled visit for 78 year old Caucasian male who has distal ulcerative colitis and not responding to treatment. On his last visit of 09/13/2012 he was begun on 6-MP at a reduced dose since TPMT activity was intermediate. He came to the emergency room on 09/24/2012 with fever of 104. In emergency room his temp was 100.4. His evaluation was unremarkable. He did not appear to be toxic. I was contacted by Dr. Roderic Palau and recommended that 6 MP be discontinued. He feels better. 2 days ago his temp was 102 and yesterday had rigors and his temp was 102.4. He is having multiple bowel movements per day. On some days he has 8-12 bowel movements. Volume is small. At times all he passes his gas blood and mucus. This morning he did pass soft stool. He also complains of discomfort and left lower quadrant. His appetite has been fair. He has lost 4-5 pounds since his last visit. He denies nausea vomiting skin rash or dyspnea.  Current Medications: Current Outpatient Prescriptions  Medication Sig Dispense Refill  . aspirin EC 81 MG tablet Take 81 mg by mouth every morning.       . Mesalamine (ASACOL HD) 800 MG TBEC Take 2 tablets by mouth 2 (two) times daily.      . metFORMIN (GLUCOPHAGE-XR) 500 MG 24 hr tablet Take 500 mg by mouth daily with supper.       . valsartan (DIOVAN) 80 MG tablet Take 80 mg by mouth every morning.          Objective: Blood pressure 128/76, pulse 80, temperature 97.3 F (36.3 C), temperature source Oral, resp. rate 18, height 6' (1.829 m), weight 200 lb 6.4 oz (90.901 kg). Patient is alert and in no acute distress. Conjunctiva is pink. Sclera is nonicteric Oropharyngeal mucosa is normal. No neck masses or thyromegaly noted. Cardiac exam with regular rhythm normal S1 and S2. No murmur or gallop noted. Lungs are clear to auscultation. Abdomen is soft with mild tenderness at LLQ no organomegaly or  masses noted. No LE edema or clubbing noted.  Labs/studies Results: Blood cultures from 09/24/2012 remain negative. Urine analysis was negative for nitrites and leukocytes. WBC was 8.8 H&H 14.4 and 41.3 with platelet count of 126K. Comprehensive chemistry panel was normal with exception of sodium of 134 and glucose of 127.    Assessment:  Ulcerative colitis. Disease appears to be refractory to therapy. Last colonoscopy was in July 2013 revealing active disease primarily in sigmoid colon. Biopsy showed typical changes of UC. He was transitioned from sulfasalazine to oral mesalamine and he also has been treated with hydrocortisone enemas, Uceris and finally begun on 6 MP 2 weeks ago but discontinued over the weekend because of fever. Treatment options reviewed with patient and his wife. These include topical mesalamine in addition to oral mesalamine, methotrexate and biologic therapy. He is agreeable to trying mesalamine enemas for considering other options. We could also recreated with a higher dose of prednisone but he is not ready at this time. Finally surgery is also an option if all else fails. If he does not respond to mesalamine enemas will consider flexible sigmoidoscopy with repeat biopsy. Mild thrombocytopenia may be secondary to 6 MP.  Plan:  Patient will go to the lab for stool culture, O&P and C. difficile toxin titer. Discontinue Asacol HD and start Apriso 1.5 g by mouth daily. Mesalamine enemas 4 g per rectum daily at bedtime for 4  weeks. Office visit in 4 weeks.

## 2012-09-27 NOTE — Patient Instructions (Addendum)
Physician will contact you with results of stools studies. Discontinue Asacol HD and start Apriso 4 capsules by mouth daily.

## 2012-09-27 NOTE — Telephone Encounter (Signed)
Mr.Gauthreaux left a message states that he was seen in the ED on Friday and was there for several hours. He says he had a high fever and chills just a lousy weekend Patient is requesting that he be seen by Dr.Rehman today if at all possible The ED report says that he was seen for Fever and Fatigue He was instructed to stop 6 MP and follow up with Dr.Rehman this week I have talked with the patient and he says that his Temp was 105 Today he may feel a little better ,eariler in the morning he experienced 3-4 episodes of the jerking,has remained afebrile Mr. Omlor states that he has had no improvement with the Colitis, he was told by the ED Physician that Dr.Rehman was going to call him over the weekend but he had not heard from him to discuss this. Patient may be reached at [phone]

## 2012-09-28 ENCOUNTER — Ambulatory Visit (INDEPENDENT_AMBULATORY_CARE_PROVIDER_SITE_OTHER): Payer: Medicare Other | Admitting: Internal Medicine

## 2012-09-28 ENCOUNTER — Encounter (INDEPENDENT_AMBULATORY_CARE_PROVIDER_SITE_OTHER): Payer: Self-pay

## 2012-09-29 LAB — CULTURE, BLOOD (ROUTINE X 2): Culture: NO GROWTH

## 2012-09-29 LAB — OVA AND PARASITE SCREEN: OP: NONE SEEN

## 2012-10-02 LAB — STOOL CULTURE

## 2012-10-05 ENCOUNTER — Telehealth (INDEPENDENT_AMBULATORY_CARE_PROVIDER_SITE_OTHER): Payer: Self-pay | Admitting: *Deleted

## 2012-10-05 ENCOUNTER — Encounter (INDEPENDENT_AMBULATORY_CARE_PROVIDER_SITE_OTHER): Payer: Self-pay | Admitting: *Deleted

## 2012-10-05 DIAGNOSIS — K5190 Ulcerative colitis, unspecified, without complications: Secondary | ICD-10-CM

## 2012-10-05 NOTE — Telephone Encounter (Signed)
Lab order printed

## 2012-10-11 ENCOUNTER — Telehealth (INDEPENDENT_AMBULATORY_CARE_PROVIDER_SITE_OTHER): Payer: Self-pay | Admitting: *Deleted

## 2012-10-11 NOTE — Telephone Encounter (Signed)
Patient called and left a message that the new medication has caused him to itch and he has a rash. Patient called. He states the following: Midweek ,last week, he started to itch upper torso, on Wednesday he had a rash on the inside of his thighs the size of his hands. He read the information on both the Apriso , and the Mesalamine enema's. He stopped using the enema's. The itching and the rash stopped. He would like to continue the enemas ,but questions what could he take to combat the itching. He has used Benadryl but is unsure how much he could use? Mr.Heal says that he is unsure if the two medications are working, there are times he feels that they are and others he feels that they are not. Patient was advised this would be addressed with Dr.Rehman, I ask that he not start the Enemas until discussed with Dr.Rehman.

## 2012-10-12 NOTE — Telephone Encounter (Signed)
Per Dr.Rehman the patient may take Benadryl 25 mg twice a day and resume the enema's . He would like for him to at least try them for 2 weeks. Patient was called and made aware. The patient informed me that he failed to mention that the Apriso was causing him to have right much abdominal cramping. Then his wife shared that he has been running a fever since yesterday of 101. They were advised that Dr.Rehman would be made aware.

## 2012-10-12 NOTE — Telephone Encounter (Signed)
Patient was called and made aware that he is to stop the Apriso. We will try him on Delizcol 400 mg take 4 tablets twice a day. Samples will be given.  The temp may be related to the medication, is after 2 days of being off the medication and he still has fever we will do a CBC, if his temp should get greater that 101 he is to let us know so that we may obtain blood cultures.

## 2012-10-13 DIAGNOSIS — K5190 Ulcerative colitis, unspecified, without complications: Secondary | ICD-10-CM | POA: Diagnosis not present

## 2012-10-13 LAB — CBC
HCT: 40.8 % (ref 39.0–52.0)
MCH: 32.9 pg (ref 26.0–34.0)
MCV: 93.8 fL (ref 78.0–100.0)
Platelets: 233 10*3/uL (ref 150–400)
RDW: 13.5 % (ref 11.5–15.5)

## 2012-10-16 ENCOUNTER — Telehealth (INDEPENDENT_AMBULATORY_CARE_PROVIDER_SITE_OTHER): Payer: Self-pay | Admitting: Internal Medicine

## 2012-10-16 ENCOUNTER — Other Ambulatory Visit (INDEPENDENT_AMBULATORY_CARE_PROVIDER_SITE_OTHER): Payer: Self-pay | Admitting: Internal Medicine

## 2012-10-16 DIAGNOSIS — R450 Nervousness: Secondary | ICD-10-CM

## 2012-10-16 DIAGNOSIS — K5190 Ulcerative colitis, unspecified, without complications: Secondary | ICD-10-CM

## 2012-10-16 MED ORDER — ALPRAZOLAM 0.25 MG PO TABS: 0.2500 mg | ORAL_TABLET | Freq: Three times a day (TID) | ORAL | Status: DC | PRN

## 2012-10-16 MED ORDER — PREDNISONE (PAK) 10 MG PO TABS: 40.0000 mg | ORAL_TABLET | Freq: Every day | ORAL | Status: DC

## 2012-10-16 NOTE — Telephone Encounter (Signed)
Patient's wife called stating that her husband was not doing well. He still running fever he has abdominal cramps and remains with diarrhea. He just had CBC few days ago and was normal. Patient advised to stop mesalamine and enemas. started on will start him on prednisone 40 mg by mouth every morning. Xanax O. 0.25 mg 3 times a day when necessary. Office visit next week

## 2012-10-19 ENCOUNTER — Ambulatory Visit (INDEPENDENT_AMBULATORY_CARE_PROVIDER_SITE_OTHER): Payer: Medicare Other | Admitting: Internal Medicine

## 2012-10-19 ENCOUNTER — Encounter (INDEPENDENT_AMBULATORY_CARE_PROVIDER_SITE_OTHER): Payer: Self-pay | Admitting: Internal Medicine

## 2012-10-19 VITALS — BP 110/68 | HR 74 | Temp 97.9°F | Resp 16 | Ht 72.0 in | Wt 203.3 lb

## 2012-10-19 DIAGNOSIS — K5190 Ulcerative colitis, unspecified, without complications: Secondary | ICD-10-CM | POA: Diagnosis not present

## 2012-10-19 DIAGNOSIS — R5381 Other malaise: Secondary | ICD-10-CM | POA: Diagnosis not present

## 2012-10-19 DIAGNOSIS — R531 Weakness: Secondary | ICD-10-CM

## 2012-10-19 NOTE — Patient Instructions (Signed)
Continue prednisone at current dose which is 40 mg by mouth daily. Physician will contact you with results of blood work.

## 2012-10-19 NOTE — Progress Notes (Signed)
Presenting complaint;  Followup for ulcerative colitis.  Subjective:  Patient is 78 year old Caucasian male who has chronic ulcerative colitis and has been dealing with relapse for almost 6 months. She was intolerant of 6 MP. She did not see any improvement with hydrocortisone enemas as well as Uceris. About 3 weeks ago he was seen in emergency room for fever and his blood cultures were negative. He has been on mesalamine for several weeks. He was recently switched to [place] and begun on mesalamine enemas. His wife called me 4 days ago stating that he was feeling very weak and he had a temp of 101.2. He did not have cough shortness of breath or urinary symptoms. Patient was begun on prednisone and given prescription for alprazolam to treat nervousness from prednisone. His appetite has improved. He is still weak but not like he was 4 days ago. He is still having multiple bowel movements but they're small. At times all he passes his flatness and gas. Has urgency and he is having sporadic accidents like he had one this morning. He hasn't had fever in the last 3 days. He is having intermittent lower abdominal cramps. His weight has remained stable this year. He is not having any side effects with alprazolam.  Current Medications: Current Outpatient Prescriptions  Medication Sig Dispense Refill  . ALPRAZolam (XANAX) 0.25 MG tablet Take 1 tablet (0.25 mg total) by mouth 3 (three) times daily as needed for sleep.  60 tablet  0  . aspirin EC 81 MG tablet Take 81 mg by mouth every morning.       . metFORMIN (GLUCOPHAGE-XR) 500 MG 24 hr tablet Take 500 mg by mouth daily with supper.       . predniSONE (STERAPRED UNI-PAK) 10 MG tablet Take 4 tablets (40 mg total) by mouth daily.  100 tablet  0  . valsartan (DIOVAN) 80 MG tablet Take 80 mg by mouth every morning.       . mesalamine (APRISO) 0.375 G 24 hr capsule Take 1 capsule (0.375 g total) by mouth daily.  120 capsule  5  . mesalamine (ROWASA) 4 G enema  Place 60 mLs (4 g total) rectally at bedtime.  28 Bottle  1     Objective: Blood pressure 110/68, pulse 74, temperature 97.9 F (36.6 C), temperature source Oral, resp. rate 16, height 6' (1.829 m), weight 203 lb 4.8 oz (92.216 kg). Patient is alert and appears to be in no acute distress Conjunctiva is pink. Sclera is nonicteric Oropharyngeal mucosa is normal. No neck masses or thyromegaly noted. Cardiac exam with regular rhythm normal S1 and S2. No murmur or gallop noted. Lungs are clear to auscultation. Abdomen is soft and nontender without organomegaly or masses.  No LE edema or clubbing noted.  Labs/studies Results: CBC from 10/13/2012. WBC 7.4, H&H 14.3 and 40.8. Platelet count was 233K. Stool culture and C. difficile were negative and O. and P. not completed.  Assessment:  Patient remains with symptoms of active colitis. His symptoms would appear to be moderate. He has not responded to any therapy yet. His colonoscopy was in July 2013 revealing disease in the sigmoid colon only. Biopsy showed typical changes of UC and no super added process. He is now on 40 mg of prednisone daily and so far tolerating it with when necessary use of alprazolam. If he does not feel a lot better within the next week we'll consider flexible sigmoidoscopy with biopsy. Etiology of his weakness is not clear. His H&H has  remained normal since his symptoms began. Will check electrolytes and TSH.   Plan:  Continue prednisone at current dose. Was start prednisone taper within 1-2 weeks depending on his clinical course. Patient will go to the lab for CBC, CRP and TSH. Office visit in one month.

## 2012-10-20 LAB — C-REACTIVE PROTEIN: CRP: 2.1 mg/dL — ABNORMAL HIGH (ref <0.60)

## 2012-10-20 LAB — BASIC METABOLIC PANEL
BUN: 26 mg/dL — ABNORMAL HIGH (ref 6–23)
CO2: 25 mEq/L (ref 19–32)
Calcium: 8.7 mg/dL (ref 8.4–10.5)
Creat: 0.96 mg/dL (ref 0.50–1.35)

## 2012-10-25 DIAGNOSIS — E119 Type 2 diabetes mellitus without complications: Secondary | ICD-10-CM | POA: Diagnosis not present

## 2012-10-25 DIAGNOSIS — E1149 Type 2 diabetes mellitus with other diabetic neurological complication: Secondary | ICD-10-CM | POA: Diagnosis not present

## 2012-10-28 ENCOUNTER — Other Ambulatory Visit (INDEPENDENT_AMBULATORY_CARE_PROVIDER_SITE_OTHER): Payer: Self-pay | Admitting: *Deleted

## 2012-10-28 ENCOUNTER — Telehealth (INDEPENDENT_AMBULATORY_CARE_PROVIDER_SITE_OTHER): Payer: Self-pay | Admitting: *Deleted

## 2012-10-28 DIAGNOSIS — K5190 Ulcerative colitis, unspecified, without complications: Secondary | ICD-10-CM

## 2012-10-28 NOTE — Telephone Encounter (Signed)
Manuel Atkins called and states that he was to call me to remind Dr.Rehman to call him this weekend. Manuel Atkins shared that he felt worse. He try to eat and it will go straight through him,feel that he is starving He is guessing his weight is 193lbs Per Dr.Rehman  Patient needs to have a Sigmoidoscopy as soon as possible --Patient has UC Patient made aware and also told that Dr.Rehman will call him this week end

## 2012-10-28 NOTE — Telephone Encounter (Signed)
Flex sig sch'd 11/01/12 @ 1230 (1130), patient aware

## 2012-10-29 ENCOUNTER — Encounter (HOSPITAL_COMMUNITY): Payer: Self-pay | Admitting: Pharmacy Technician

## 2012-11-01 ENCOUNTER — Encounter (HOSPITAL_COMMUNITY): Payer: Self-pay | Admitting: *Deleted

## 2012-11-01 ENCOUNTER — Ambulatory Visit (HOSPITAL_COMMUNITY)
Admission: RE | Admit: 2012-11-01 | Discharge: 2012-11-01 | Disposition: A | Discharge status: Home / Self Care | Payer: Medicare Other | Source: Ambulatory Visit | Attending: Internal Medicine | Admitting: Internal Medicine

## 2012-11-01 ENCOUNTER — Encounter (HOSPITAL_COMMUNITY)
Admission: RE | Disposition: A | Discharge status: Home / Self Care | Payer: Self-pay | Source: Ambulatory Visit | Attending: Internal Medicine

## 2012-11-01 DIAGNOSIS — I10 Essential (primary) hypertension: Secondary | ICD-10-CM | POA: Insufficient documentation

## 2012-11-01 DIAGNOSIS — K5190 Ulcerative colitis, unspecified, without complications: Secondary | ICD-10-CM | POA: Diagnosis not present

## 2012-11-01 DIAGNOSIS — R197 Diarrhea, unspecified: Secondary | ICD-10-CM | POA: Diagnosis not present

## 2012-11-01 DIAGNOSIS — E119 Type 2 diabetes mellitus without complications: Secondary | ICD-10-CM | POA: Diagnosis not present

## 2012-11-01 DIAGNOSIS — K625 Hemorrhage of anus and rectum: Secondary | ICD-10-CM

## 2012-11-01 HISTORY — PX: FLEXIBLE SIGMOIDOSCOPY: SHX5431

## 2012-11-01 SURGERY — SIGMOIDOSCOPY, FLEXIBLE
Anesthesia: Moderate Sedation

## 2012-11-01 MED ORDER — MIDAZOLAM HCL 5 MG/5ML IJ SOLN
INTRAMUSCULAR | Status: DC | PRN
  Administered 2012-11-01: 2 mg via INTRAVENOUS
  Administered 2012-11-01: 1 mg via INTRAVENOUS

## 2012-11-01 MED ORDER — SODIUM CHLORIDE 0.45 % IV SOLN
INTRAVENOUS | Status: DC
  Administered 2012-11-01: 12:00:00 via INTRAVENOUS

## 2012-11-01 MED ORDER — MIDAZOLAM HCL 5 MG/5ML IJ SOLN
INTRAMUSCULAR | Status: AC
  Filled 2012-11-01: qty 5

## 2012-11-01 MED ORDER — TUBERCULIN PPD 5 UNIT/0.1ML ID SOLN
5.0000 [IU] | Freq: Once | INTRADERMAL | Status: AC
  Administered 2012-11-01: 5 [IU] via INTRADERMAL
  Filled 2012-11-01: qty 0.1

## 2012-11-01 MED ORDER — MEPERIDINE HCL 50 MG/ML IJ SOLN
INTRAMUSCULAR | Status: AC
  Filled 2012-11-01: qty 1

## 2012-11-01 MED ORDER — STERILE WATER FOR IRRIGATION IR SOLN
Status: DC | PRN
  Administered 2012-11-01: 13:00:00

## 2012-11-01 MED ORDER — MEPERIDINE HCL 25 MG/ML IJ SOLN
INTRAMUSCULAR | Status: DC | PRN
  Administered 2012-11-01: 25 mg via INTRAVENOUS

## 2012-11-01 NOTE — Op Note (Addendum)
FLEXIBLE SIGMOIDOSCOPY  PROCEDURE REPORT  PATIENT:  Manuel Atkins  MR#:  [id number] Birthdate:  08-Feb-1933, 78 y.o., male Endoscopist:  Dr. Rogene Houston, MD Referred By:  Dr. Gar Ponto, MD Procedure Date: 11/01/2012  Procedure:  Flexible sigmoidoscopy.  Indications:  Patient is 78 year old Caucasian male with history of ulcerative colitis which was initially diagnosed in 5. He presented earlier this year with diarrhea and rectal bleeding. He underwent colonoscopy in July 2013 revealing active disease in rectum and sigmoid colon. He has been refractory to therapy. He could not tolerate 6-MP. He has failed oral and topical mesalamine, Uceris as well as topical and systemic steroids. He is undergoing sigmoidoscopy for reevaluation of his disease and also to rule out superadded CMV colitis. Please note his stool studies been negative.  Informed Consent:  The procedure and risks were reviewed with the patient and informed consent was obtained.  Medications:  Demerol 25 mg IV Versed 3 mg IV  Description of procedure:  After a digital rectal exam was performed, that colonoscope was advanced from the anus through the rectum into sigmoid colon and region of splenic flexure. He had formed stool in this area. As this goes gradually withdrawn mucosa was carefully examined and findings noted. While in the rectum scope was retroflexed.   Findings:   Diffuse changes of erythema friability erosions and ulcers all the way from the dentate line to splenic flexure. No transition zone noted in the areas that were examined. Biopsies taken from sigmoid colon and rectal mucosa and submitted separately. Small hemorrhoids below the dentate line.  Therapeutic/Diagnostic Maneuvers Performed:  See above  Complications:  None   Impression:  Active colitis involving rectum, sigmoid  and descending colon(segments that were examined). Endoscopic appearance consistent with ulcerated colitis. Multiple  biopsies taken from mucosa of sigmoid colon rectum to rule out superadded CMV colitis.  Recommendations:  Continue prednisone at 40 mg by mouth every morning. Low residue diet. I would be contacting patient with biopsy results and further recommendations. Will place PPD today.  REHMAN,NAJEEB U  11/01/2012 1:14 PM  CC: Dr. Gar Ponto, MD & Dr. Rayne Du ref. provider found
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
## 2012-11-05 ENCOUNTER — Encounter (INDEPENDENT_AMBULATORY_CARE_PROVIDER_SITE_OTHER): Payer: Self-pay | Admitting: *Deleted

## 2012-11-07 ENCOUNTER — Inpatient Hospital Stay (HOSPITAL_COMMUNITY)
Admission: EM | Admit: 2012-11-07 | Discharge: 2012-11-10 | DRG: 386 | Disposition: A | Discharge status: Home / Self Care | Payer: Medicare Other | Attending: Internal Medicine | Admitting: Internal Medicine

## 2012-11-07 ENCOUNTER — Encounter (HOSPITAL_COMMUNITY): Payer: Self-pay | Admitting: *Deleted

## 2012-11-07 DIAGNOSIS — R450 Nervousness: Secondary | ICD-10-CM

## 2012-11-07 DIAGNOSIS — I498 Other specified cardiac arrhythmias: Secondary | ICD-10-CM | POA: Diagnosis present

## 2012-11-07 DIAGNOSIS — IMO0001 Reserved for inherently not codable concepts without codable children: Secondary | ICD-10-CM | POA: Diagnosis not present

## 2012-11-07 DIAGNOSIS — D696 Thrombocytopenia, unspecified: Secondary | ICD-10-CM | POA: Diagnosis present

## 2012-11-07 DIAGNOSIS — E782 Mixed hyperlipidemia: Secondary | ICD-10-CM

## 2012-11-07 DIAGNOSIS — K5190 Ulcerative colitis, unspecified, without complications: Secondary | ICD-10-CM | POA: Diagnosis not present

## 2012-11-07 DIAGNOSIS — K5180 Other ulcerative colitis without complications: Principal | ICD-10-CM | POA: Diagnosis present

## 2012-11-07 DIAGNOSIS — K5100 Ulcerative (chronic) pancolitis without complications: Secondary | ICD-10-CM

## 2012-11-07 DIAGNOSIS — R509 Fever, unspecified: Secondary | ICD-10-CM

## 2012-11-07 DIAGNOSIS — D649 Anemia, unspecified: Secondary | ICD-10-CM

## 2012-11-07 DIAGNOSIS — E860 Dehydration: Secondary | ICD-10-CM | POA: Diagnosis not present

## 2012-11-07 DIAGNOSIS — E663 Overweight: Secondary | ICD-10-CM

## 2012-11-07 DIAGNOSIS — E871 Hypo-osmolality and hyponatremia: Secondary | ICD-10-CM | POA: Diagnosis present

## 2012-11-07 DIAGNOSIS — I10 Essential (primary) hypertension: Secondary | ICD-10-CM | POA: Diagnosis not present

## 2012-11-07 DIAGNOSIS — A09 Infectious gastroenteritis and colitis, unspecified: Secondary | ICD-10-CM | POA: Diagnosis not present

## 2012-11-07 DIAGNOSIS — B259 Cytomegaloviral disease, unspecified: Secondary | ICD-10-CM | POA: Diagnosis present

## 2012-11-07 DIAGNOSIS — R0689 Other abnormalities of breathing: Secondary | ICD-10-CM

## 2012-11-07 DIAGNOSIS — R197 Diarrhea, unspecified: Secondary | ICD-10-CM | POA: Diagnosis not present

## 2012-11-07 DIAGNOSIS — F068 Other specified mental disorders due to known physiological condition: Secondary | ICD-10-CM

## 2012-11-07 DIAGNOSIS — A0839 Other viral enteritis: Secondary | ICD-10-CM

## 2012-11-07 HISTORY — DX: Thrombocytopenia, unspecified: D69.6

## 2012-11-07 HISTORY — DX: Cytomegaloviral disease, unspecified: B25.9

## 2012-11-07 HISTORY — DX: Other viral enteritis: A08.39

## 2012-11-07 LAB — CBC WITH DIFFERENTIAL/PLATELET
Basophils Absolute: 0 10*3/uL (ref 0.0–0.1)
Eosinophils Absolute: 0.2 10*3/uL (ref 0.0–0.7)
Eosinophils Relative: 2 % (ref 0–5)
Lymphocytes Relative: 5 % — ABNORMAL LOW (ref 12–46)
Lymphs Abs: 0.5 10*3/uL — ABNORMAL LOW (ref 0.7–4.0)
MCV: 93.8 fL (ref 78.0–100.0)
Neutrophils Relative %: 89 % — ABNORMAL HIGH (ref 43–77)
Platelets: 139 10*3/uL — ABNORMAL LOW (ref 150–400)
RBC: 4.48 MIL/uL (ref 4.22–5.81)
RDW: 14.1 % (ref 11.5–15.5)
WBC: 11.3 10*3/uL — ABNORMAL HIGH (ref 4.0–10.5)

## 2012-11-07 LAB — BASIC METABOLIC PANEL
CO2: 25 mEq/L (ref 19–32)
Calcium: 8.5 mg/dL (ref 8.4–10.5)
GFR calc non Af Amer: 57 mL/min — ABNORMAL LOW (ref >90)
Glucose, Bld: 230 mg/dL — ABNORMAL HIGH (ref 70–99)
Potassium: 4.3 mEq/L (ref 3.5–5.1)
Sodium: 128 mEq/L — ABNORMAL LOW (ref 135–145)

## 2012-11-07 MED ORDER — VALGANCICLOVIR HCL 450 MG PO TABS
900.0000 mg | ORAL_TABLET | Freq: Three times a day (TID) | ORAL | Status: DC
  Filled 2012-11-07 (×5): qty 2

## 2012-11-07 MED ORDER — SODIUM CHLORIDE 0.9 % IV BOLUS (SEPSIS)
1000.0000 mL | Freq: Once | INTRAVENOUS | Status: AC
  Administered 2012-11-07: 1000 mL via INTRAVENOUS

## 2012-11-07 MED ORDER — METHYLPREDNISOLONE SODIUM SUCC 40 MG IJ SOLR
30.0000 mg | Freq: Two times a day (BID) | INTRAMUSCULAR | Status: DC
  Administered 2012-11-07 – 2012-11-10 (×6): 30 mg via INTRAVENOUS
  Filled 2012-11-07 (×7): qty 1

## 2012-11-07 NOTE — ED Notes (Signed)
Pt c/o diarrhea and weakness. pts wife states pt fell earlier today pt denies injury from fall and no head injury.

## 2012-11-07 NOTE — ED Notes (Signed)
Pt c/o diarrhea and weakness. Pt states he has been seeing a specialist for issues with his colon. Pt has hx of ulcerative colitis. Pt called specialist and was told to come here for evaluation and possible admission. Pt denies abdominal pain at this time but does report intermittent cramping pains. Pt denies chest pain and vomiting.
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
## 2012-11-08 ENCOUNTER — Encounter (HOSPITAL_COMMUNITY): Payer: Self-pay | Admitting: Internal Medicine

## 2012-11-08 DIAGNOSIS — A0839 Other viral enteritis: Secondary | ICD-10-CM

## 2012-11-08 DIAGNOSIS — B259 Cytomegaloviral disease, unspecified: Secondary | ICD-10-CM

## 2012-11-08 DIAGNOSIS — E860 Dehydration: Secondary | ICD-10-CM | POA: Diagnosis present

## 2012-11-08 DIAGNOSIS — K5190 Ulcerative colitis, unspecified, without complications: Secondary | ICD-10-CM | POA: Diagnosis present

## 2012-11-08 DIAGNOSIS — R197 Diarrhea, unspecified: Secondary | ICD-10-CM

## 2012-11-08 DIAGNOSIS — A09 Infectious gastroenteritis and colitis, unspecified: Secondary | ICD-10-CM

## 2012-11-08 HISTORY — DX: Cytomegaloviral disease, unspecified: B25.9

## 2012-11-08 HISTORY — DX: Other viral enteritis: A08.39

## 2012-11-08 LAB — TSH: TSH: 0.902 u[IU]/mL (ref 0.350–4.500)

## 2012-11-08 LAB — URINALYSIS, ROUTINE W REFLEX MICROSCOPIC
Leukocytes, UA: NEGATIVE
Nitrite: NEGATIVE
Protein, ur: NEGATIVE mg/dL
Specific Gravity, Urine: 1.025 (ref 1.005–1.030)
Urobilinogen, UA: 0.2 mg/dL (ref 0.0–1.0)

## 2012-11-08 LAB — CBC WITH DIFFERENTIAL/PLATELET
Eosinophils Absolute: 0 10*3/uL (ref 0.0–0.7)
Hemoglobin: 13.2 g/dL (ref 13.0–17.0)
Lymphocytes Relative: 8 % — ABNORMAL LOW (ref 12–46)
Lymphs Abs: 0.6 10*3/uL — ABNORMAL LOW (ref 0.7–4.0)
MCH: 32.3 pg (ref 26.0–34.0)
Monocytes Relative: 1 % — ABNORMAL LOW (ref 3–12)
Neutro Abs: 6.4 10*3/uL (ref 1.7–7.7)
Neutrophils Relative %: 91 % — ABNORMAL HIGH (ref 43–77)
Platelets: 118 10*3/uL — ABNORMAL LOW (ref 150–400)
RBC: 4.09 MIL/uL — ABNORMAL LOW (ref 4.22–5.81)
WBC: 7.1 10*3/uL (ref 4.0–10.5)

## 2012-11-08 LAB — COMPREHENSIVE METABOLIC PANEL
ALT: 24 U/L (ref 0–53)
AST: 14 U/L (ref 0–37)
Alkaline Phosphatase: 52 U/L (ref 39–117)
Calcium: 8.2 mg/dL — ABNORMAL LOW (ref 8.4–10.5)
Glucose, Bld: 200 mg/dL — ABNORMAL HIGH (ref 70–99)
Potassium: 4.8 mEq/L (ref 3.5–5.1)
Sodium: 134 mEq/L — ABNORMAL LOW (ref 135–145)
Total Protein: 6.6 g/dL (ref 6.0–8.3)

## 2012-11-08 LAB — CBC
Hemoglobin: 14 g/dL (ref 13.0–17.0)
MCH: 32 pg (ref 26.0–34.0)
MCHC: 33.7 g/dL (ref 30.0–36.0)
Platelets: 108 10*3/uL — ABNORMAL LOW (ref 150–400)
RBC: 4.38 MIL/uL (ref 4.22–5.81)

## 2012-11-08 LAB — MAGNESIUM: Magnesium: 2 mg/dL (ref 1.5–2.5)

## 2012-11-08 LAB — GLUCOSE, CAPILLARY
Glucose-Capillary: 187 mg/dL — ABNORMAL HIGH (ref 70–99)
Glucose-Capillary: 206 mg/dL — ABNORMAL HIGH (ref 70–99)

## 2012-11-08 LAB — CLOSTRIDIUM DIFFICILE BY PCR: Toxigenic C. Difficile by PCR: NEGATIVE

## 2012-11-08 MED ORDER — BISMUTH SUBSALICYLATE 262 MG/15ML PO SUSP
30.0000 mL | ORAL | Status: DC | PRN
  Filled 2012-11-08: qty 236

## 2012-11-08 MED ORDER — IRBESARTAN 75 MG PO TABS
75.0000 mg | ORAL_TABLET | Freq: Every day | ORAL | Status: DC
  Administered 2012-11-08 – 2012-11-10 (×3): 75 mg via ORAL
  Filled 2012-11-08 (×3): qty 1

## 2012-11-08 MED ORDER — METFORMIN HCL ER 500 MG PO TB24
500.0000 mg | ORAL_TABLET | Freq: Every day | ORAL | Status: DC
  Administered 2012-11-08 – 2012-11-09 (×2): 500 mg via ORAL
  Filled 2012-11-08 (×7): qty 1

## 2012-11-08 MED ORDER — ACETAMINOPHEN 325 MG PO TABS: 650.0000 mg | ORAL_TABLET | Freq: Four times a day (QID) | ORAL | Status: DC | PRN

## 2012-11-08 MED ORDER — VALGANCICLOVIR HCL 450 MG PO TABS
900.0000 mg | ORAL_TABLET | Freq: Two times a day (BID) | ORAL | Status: DC
  Administered 2012-11-08 – 2012-11-10 (×5): 900 mg via ORAL
  Filled 2012-11-08 (×9): qty 2

## 2012-11-08 MED ORDER — POTASSIUM CHLORIDE IN NACL 20-0.9 MEQ/L-% IV SOLN
INTRAVENOUS | Status: DC
  Administered 2012-11-08 (×3): via INTRAVENOUS
  Administered 2012-11-09: 1000 mL via INTRAVENOUS
  Administered 2012-11-09: 11:00:00 via INTRAVENOUS

## 2012-11-08 MED ORDER — INSULIN ASPART 100 UNIT/ML ~~LOC~~ SOLN
0.0000 [IU] | Freq: Three times a day (TID) | SUBCUTANEOUS | Status: DC
  Administered 2012-11-08: 3 [IU] via SUBCUTANEOUS
  Administered 2012-11-08: 2 [IU] via SUBCUTANEOUS
  Administered 2012-11-08: 1 [IU] via SUBCUTANEOUS
  Administered 2012-11-09: 2 [IU] via SUBCUTANEOUS
  Administered 2012-11-09: 1 [IU] via SUBCUTANEOUS
  Administered 2012-11-09: 3 [IU] via SUBCUTANEOUS

## 2012-11-08 MED ORDER — ONDANSETRON HCL 4 MG/2ML IJ SOLN: 4.0000 mg | Freq: Four times a day (QID) | INTRAMUSCULAR | Status: DC | PRN

## 2012-11-08 MED ORDER — ALPRAZOLAM 0.25 MG PO TABS: 0.2500 mg | ORAL_TABLET | Freq: Three times a day (TID) | ORAL | Status: DC | PRN

## 2012-11-08 MED ORDER — TRAZODONE HCL 50 MG PO TABS
50.0000 mg | ORAL_TABLET | Freq: Every evening | ORAL | Status: DC | PRN
  Administered 2012-11-08 – 2012-11-09 (×2): 50 mg via ORAL
  Filled 2012-11-08 (×2): qty 1

## 2012-11-08 MED ORDER — ASPIRIN EC 81 MG PO TBEC
81.0000 mg | DELAYED_RELEASE_TABLET | Freq: Every morning | ORAL | Status: DC
  Administered 2012-11-08 – 2012-11-10 (×3): 81 mg via ORAL
  Filled 2012-11-08 (×3): qty 1

## 2012-11-08 MED ORDER — RISAQUAD PO CAPS
1.0000 | ORAL_CAPSULE | Freq: Every day | ORAL | Status: DC
  Administered 2012-11-08 – 2012-11-10 (×3): 1 via ORAL
  Filled 2012-11-08 (×3): qty 1

## 2012-11-08 MED ORDER — INSULIN ASPART 100 UNIT/ML ~~LOC~~ SOLN: 0.0000 [IU] | Freq: Every day | SUBCUTANEOUS | Status: DC

## 2012-11-08 NOTE — Progress Notes (Signed)
Inpatient Diabetes Program Recommendations  AACE/ADA: New Consensus Statement on Inpatient Glycemic Control (2013)  Target Ranges:  Prepandial:   less than 140 mg/dL      Peak postprandial:   less than 180 mg/dL (1-2 hours)      Critically ill patients:  140 - 180 mg/dL  Results for Manuel Atkins, Manuel Atkins (MRN [id number]) as of 11/08/2012 08:35  Ref. Range 11/07/2012 22:19 11/08/2012 05:15  Glucose Latest Range: 70-99 mg/dL 230 (H) 200 (H)    Inpatient Diabetes Program Recommendations Insulin - Basal: May want to consider starting on low dose basal insulin.  Recommend Levemir 10 units daily.  Note: Patient has had persistent diarrhea for past 3 days and has reported as many as 36 stools in the past 3 days.  Patient is ordered to receive Metformin 540m daily with supper which has the potential to worsen GI symptoms.  Recommend that Metformin be held as an inpatient and patient be started on a low dose basal insulin (recommend Levemir 10 units daily) and titrated as necessary to maintain glycemic control.  Will continue to follow.  Thanks, MBarnie Alderman RN, BSN, COldsmarDiabetes Coordinator Inpatient Diabetes Program [phone]

## 2012-11-08 NOTE — Progress Notes (Signed)
Patient admitted earlier this morning by Dr. Megan Salon  Patient seen and examined, database reviewed.  He is feeling better today.  Minimal diarrhea since admission. C diff is pending.  GI is following, advanced diet.  He is on steroids.  Will continue current treatments and follow.

## 2012-11-08 NOTE — Progress Notes (Addendum)
Brief history of present illness; Patient is 78 year old Caucasian male who was admitted to Dr. Blythe Stanford service last evening via emergency room. He presented with copious diarrhea, extreme weakness and a fall. Patient has over 30 year history of ulcerative colitis and had remained in remission until this year. His last colonoscopy was in September of 2008 when he had distal proctitis and 3 mm benign polyp was removed. He began to have rectal bleeding and also passed mucus per rectum around May of this year. He did not respond to topical as well as oral mesalamine. He underwent colonoscopy in July 2013 revealing patchy disease at rectum and sigmoid colon.   he was treated with sulfasalazine, oral mesalamine, mesalamine enemas as well as hydrocortisone enemas. He could not tolerate 6-MP even at a lower dose(intermediate TPMT activity). He has been on prednisone 40-60 mg per day without symptomatic improvement. He therefore underwent flexible sigmoidoscopy on 11/01/2012 revealing diffuse changes of severe colitis from rectum to splenic flexure. Biopsy revealed changes severe colitis and in no histochemical stains positive for CMV. Patient was begun on oral Vanganciclovir on 11/04/2012. He has been able to keep his medication down. Prednisone dose was dropped to 20 mg twice daily on the same date. Over the last several weeks patient has been complaining of progressive weakness. His H&H has remained normal. His serum potassium has remained normal and his TSH was also normal. He had stool studies last month and these were negative for enteric pathogens as well as C diff. Patient's wife called me last evening stating that he was very weak and he could not even walk. He fell but did not pass out. Very had over 10 bowel movements daily for the last 3 days and noted drop in his appetite. He did not experience fever nausea vomiting or frank bleeding. He also noted some abdominal pain yesterday but none today. This  morning he feels better than he has in several days. Current medications; Current Facility-Administered Medications  Medication Dose Route Frequency Provider Last Rate Last Dose  . 0.9 % NaCl with KCl 20 mEq/ L  infusion   Intravenous Continuous Karlyn Agee, MD 125 mL/hr at 11/08/12 [phone]    . acetaminophen (TYLENOL) tablet 650 mg  650 mg Oral Q6H PRN Karlyn Agee, MD      . acidophilus (RISAQUAD) capsule 1 capsule  1 capsule Oral Daily Karlyn Agee, MD   1 capsule at 11/08/12 0921  . ALPRAZolam Duanne Moron) tablet 0.25 mg  0.25 mg Oral TID PRN Karlyn Agee, MD      . aspirin EC tablet 81 mg  81 mg Oral q morning - 10a Karlyn Agee, MD   81 mg at 11/08/12 1856  . bismuth subsalicylate (PEPTO BISMOL) 262 MG/15ML suspension 30 mL  30 mL Oral Q1H PRN Karlyn Agee, MD      . insulin aspart (novoLOG) injection 0-5 Units  0-5 Units Subcutaneous QHS Karlyn Agee, MD      . insulin aspart (novoLOG) injection 0-9 Units  0-9 Units Subcutaneous TID WC Karlyn Agee, MD   3 Units at 11/08/12 1204  . irbesartan (AVAPRO) tablet 75 mg  75 mg Oral Daily Karlyn Agee, MD   75 mg at 11/08/12 3149  . metFORMIN (GLUCOPHAGE-XR) 24 hr tablet 500 mg  500 mg Oral Q supper Karlyn Agee, MD      . methylPREDNISolone sodium succinate (SOLU-MEDROL) 40 mg/mL injection 30 mg  30 mg Intravenous Q12H [name], MD   30 mg at 11/08/12 0559  .  ondansetron (ZOFRAN) injection 4 mg  4 mg Intravenous Q6H PRN Karlyn Agee, MD      . traZODone (DESYREL) tablet 50 mg  50 mg Oral QHS PRN Karlyn Agee, MD      . valGANciclovir (VALCYTE) 450 MG tablet TABS 900 mg  900 mg Oral BID Rogene Houston, MD   900 mg at 11/08/12 1000   Objective; BP 110/60  Pulse 66  Temp 97.8 F (36.6 C) (Oral)  Resp 20  Ht 6' (1.829 m)  Wt 185 lb (83.915 kg)  BMI 25.09 kg/m2  SpO2 98% Patient is alert and does not appear to be in any distress. Conjunctiva is pink. Sclera is nonicteric. Oropharyngeal mucosa is  normal. No neck masses or thyromegaly noted. Cardiac exam with regular rhythm normal S1 and S2. No murmur or gallop noted. Lungs are clear to auscultation. Abdomen is symmetrical and not distended. Bowel sounds are normal. Abdomen is soft with mild tenderness at LLQ but no organomegaly or masses noted. Lab data; From 11/07/2012.  WBC 11.3 H&H 14.6 and 42, platelet count 139K Differential reveals 89% neutrophils. Urine analysis pertinent for ketones. Serum sodium 128, potassium 4.3, chloride 93, CO2 25, glucose 2:30, BUN 22, creatinine 1.19 and calcium 8.5. Stool C. difficile by PCR is pending. Serum sodium from this morning is 134 and albumin is 2.7. PPD was negative(documented in an office on 11/04/2012).  Assessment; Patient has active ulcerative colitis along CMV colitis. He was begun on antiviral therapy 3 days ago and he hasn't reached benefit of this medication. He has developed dehydration and hyponatremia. Urine positive for ketones but he does not appear to have DKA since his CO2 is normal. This finding in my opinion is indicated above dehydration. Agree with present therapy I believe he can advance his diet to full liquids.  Recommendations; Advance diet to full liquids along with yogurt at each meal. Continue IV Solu-Medrol at current dose. Decrease vanganciclovir dose to 900 mg by mouth twice a day. Will check CBC and metabolic 7 in a.m. Will also check hepatitis B surface antigen in case he may require biologic therapy for UC later. Will start him on oral mesalamine when he is ready to be discharged.

## 2012-11-08 NOTE — Progress Notes (Signed)
UR Chart Review Completed  
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
## 2012-11-09 DIAGNOSIS — E860 Dehydration: Secondary | ICD-10-CM

## 2012-11-09 LAB — GLUCOSE, CAPILLARY
Glucose-Capillary: 139 mg/dL — ABNORMAL HIGH (ref 70–99)
Glucose-Capillary: 110 mg/dL — ABNORMAL HIGH (ref 70–99)

## 2012-11-09 LAB — BASIC METABOLIC PANEL
CO2: 23 mEq/L (ref 19–32)
Chloride: 107 mEq/L (ref 96–112)
GFR calc Af Amer: >90 mL/min (ref >90)
Potassium: 4.9 mEq/L (ref 3.5–5.1)

## 2012-11-09 LAB — HEPATITIS B SURFACE ANTIGEN: Hepatitis B Surface Ag: NEGATIVE

## 2012-11-09 MED ORDER — SODIUM CHLORIDE 0.9 % IJ SOLN
3.0000 mL | Freq: Two times a day (BID) | INTRAMUSCULAR | Status: DC
  Administered 2012-11-09 (×2): 3 mL via INTRAVENOUS

## 2012-11-09 MED ORDER — SODIUM CHLORIDE 0.9 % IV SOLN
250.0000 mL | INTRAVENOUS | Status: DC | PRN
  Administered 2012-11-09: 250 mL via INTRAVENOUS

## 2012-11-09 MED ORDER — SODIUM CHLORIDE 0.9 % IJ SOLN: 3.0000 mL | INTRAMUSCULAR | Status: DC | PRN

## 2012-11-09 NOTE — Progress Notes (Signed)
Nutrition Brief Note  Patient identified by nutrition services staff. Pt and spouse requested diet education related to Low Fiber diet, and Fiber Content of common foods. Handouts from [employer] provided and questions answered. Teach back method used. Anticipate good adherence.  Body mass index is 27.57 kg/(m^2). Pt meets criteria for Overweight based on current BMI.   Current diet order is Low Fiber, patient is consuming approximately 100% of breakfast meal this morning. Labs and medications reviewed.   Pt would like to follow up with outpatient RD for additional education, meal planning and nutrition counseling related to Diabetic diet.  #[phone]

## 2012-11-09 NOTE — Care Management Note (Unsigned)
    Page 1 of 1   11/09/2012     1:57:29 PM   CARE MANAGEMENT NOTE 11/09/2012  Patient:  Manuel Atkins, Manuel Atkins   Account Number:  [id number]  Date Initiated:  11/09/2012  Documentation initiated by:  Claretha Cooper  Subjective/Objective Assessment:   Pt admitted with diarrhea/collitis. Lives at home with spouse and independent with ADLs. No HH needs identified or anticipated.     Action/Plan:   Anticipated DC Date:  11/10/2012   Anticipated DC Plan:  [place]  CM consult      Choice offered to / List presented to:             Status of service:  In process, will continue to follow Medicare Important Message given?  YES (If response is "NO", the following Medicare IM given date fields will be blank) Date Medicare IM given:  11/09/2012 Date Additional Medicare IM given:    Discharge Disposition:    Per UR Regulation:    If discussed at Long Length of Stay Meetings, dates discussed:    Comments:  11/09/12 Ivelis Norgard Robosn RN BSN CM

## 2012-11-09 NOTE — Progress Notes (Addendum)
Patient ID: Manuel Atkins, male   DOB: 06/14/1933, 78 y.o.   MRN: [id number] States he feels much better. 65% better. He did not have any stools yesterday. Passing gas. Appetite is good. No nausea or vomiting. BUN and creatinine normal., NA normal.  BMET    Component Value Date/Time   NA 136 11/09/2012 0518   K 4.9 11/09/2012 0518   CL 107 11/09/2012 0518   CO2 23 11/09/2012 0518   GLUCOSE 167* 11/09/2012 0518   BUN 15 11/09/2012 0518   CREATININE 0.78 11/09/2012 0518   CREATININE 0.96 10/19/2012 1544   CALCIUM 7.9* 11/09/2012 0518   GFRNONAA 85* 11/09/2012 0518   GFRAA >90 11/09/2012 0518     Filed Vitals:   11/09/12 0500 11/09/12 0559 11/09/12 0602 11/09/12 0603  BP:  101/56 116/68 130/64  Pulse:  59 52 54  Temp:  97 F (36.1 C) 97 F (36.1 C) 97 F (36.1 C)  TempSrc:  Oral Oral Oral  Resp:  20 20 20   Height:      Weight: 203 lb 4.8 oz (92.216 kg)     SpO2:  95% 100% 93%  Alert and oriented. Skin warm and dry. Oral mucosa is moist.   . Sclera anicteric, conjunctivae is pink. Thyroid not enlarged. No cervical lymphadenopathy. Lungs clear. Heart regular rate and rhythm.Murmur heard  Abdomen is soft. Bowel sounds are positive. No hepatomegaly. No abdominal masses felt. No tenderness.  No edema to lower extremities. Patient is alert and oriented.     Assessment/Plan: Active UC with CMV. Agree with present drug therapy. He feels much better. Will continue to monitor.    GI attending note; As above patient feels much better. He had one loose bowel movement this morning. It was small volume but he had an accident. He did not see bright red blood with his bowel movement. He has no abdominal pain. He has good appetite. His electrolyte abnormalities have corrected with IV fluids. Agree with advancing diet to low-residue diet as discussed with Dr. Roderic Palau. Will leave him on IV steroids and is ready for discharge. He will go home on prednisone 20 mg by mouth twice a day, valganciclovir  900 mg by mouth twice a day and delzicol 1.6 g by mouth twice a day(patient has all of these medications). Will plan to see him in the office in 2 weeks.

## 2012-11-09 NOTE — Progress Notes (Signed)
[facility]             Progress Note   Subjective: Patient reports he had one loose bowel movement this morning. He denies any abdominal pain or vomiting.  Objective: Vital signs in last 24 hours: Temp:  [97 F (36.1 C)-97.1 F (36.2 C)] 97 F (36.1 C) (12/31 0603) Pulse Rate:  [52-60] 54  (12/31 0603) Resp:  [20] 20  (12/31 0603) BP: (96-130)/(56-68) 130/64 mmHg (12/31 0603) SpO2:  [93 %-100 %] 93 % (12/31 0603) Weight:  [92.216 kg (203 lb 4.8 oz)] 92.216 kg (203 lb 4.8 oz) (12/31 0500) Weight change: 8.301 kg (18 lb 4.8 oz) Last BM Date: 11/08/12  Intake/Output from previous day: 12/30 0701 - 12/31 0700 In: 2319.2 [P.O.:840; I.V.:1479.2] Out: -  Total I/O In: 240 [P.O.:240] Out: -    Physical Exam: General: Alert, awake, oriented x3, in no acute distress. HEENT: No bruits, no goiter. Heart: Regular rate and rhythm, without murmurs, rubs, gallops. Lungs: Clear to auscultation bilaterally. Abdomen: Soft, nontender, nondistended, positive bowel sounds. Extremities: No clubbing cyanosis or edema with positive pedal pulses. Neuro: Grossly intact, nonfocal.    Lab Results: Basic Metabolic Panel:  Basename 11/09/12 0518 11/08/12 0515 11/07/12 2219  NA 136 134* --  K 4.9 4.8 --  CL 107 101 --  CO2 23 24 --  GLUCOSE 167* 200* --  BUN 15 18 --  CREATININE 0.78 0.90 --  CALCIUM 7.9* 8.2* --  MG -- -- 2.0  PHOS -- -- --   Liver Function Tests:  [facility] 11/08/12 0515  AST 14  ALT 24  ALKPHOS 52  BILITOT 0.9  PROT 6.6  ALBUMIN 2.7*   No results found for this basename: LIPASE:2,AMYLASE:2 in the last 72 hours No results found for this basename: AMMONIA:2 in the last 72 hours CBC:  Basename 11/08/12 0858 11/08/12 0515 11/07/12 2219  WBC 7.1 6.5 --  NEUTROABS 6.4 -- 10.1*  HGB 13.2 14.0 --  HCT 38.8* 41.5 --  MCV 94.9 94.7 --  PLT 118* 108* --   Cardiac Enzymes: No results found for this basename:  CKTOTAL:3,CKMB:3,CKMBINDEX:3,TROPONINI:3 in the last 72 hours BNP: No results found for this basename: PROBNP:3 in the last 72 hours D-Dimer: No results found for this basename: DDIMER:2 in the last 72 hours CBG:  Basename 11/09/12 0732 11/08/12 2024 11/08/12 1628 11/08/12 1119 11/08/12 0721  GLUCAP 139* 142* 128* 206* 187*   Hemoglobin A1C:  Basename 11/07/12 2219  HGBA1C 6.2*   Fasting Lipid Panel: No results found for this basename: CHOL,HDL,LDLCALC,TRIG,CHOLHDL,LDLDIRECT in the last 72 hours Thyroid Function Tests:  Basename 11/07/12 2219  TSH 0.902  T4TOTAL --  FREET4 --  T3FREE --  THYROIDAB --   Anemia Panel: No results found for this basename: VITAMINB12,FOLATE,FERRITIN,TIBC,IRON,RETICCTPCT in the last 72 hours Coagulation: No results found for this basename: LABPROT:2,INR:2 in the last 72 hours Urine Drug Screen: Drugs of Abuse  No results found for this basename: labopia, cocainscrnur, labbenz, amphetmu, thcu, labbarb    Alcohol Level: No results found for this basename: ETH:2 in the last 72 hours Urinalysis:  Basename 11/08/12 0029  COLORURINE YELLOW  LABSPEC 1.025  PHURINE 6.0  GLUCOSEU 250*  HGBUR TRACE*  BILIRUBINUR NEGATIVE  KETONESUR 15*  PROTEINUR NEGATIVE  UROBILINOGEN 0.2  NITRITE NEGATIVE  LEUKOCYTESUR NEGATIVE    Recent Results (from the past 240 hour(s))  CLOSTRIDIUM DIFFICILE BY PCR     Status: Normal   Collection Time   11/08/12 10:05 AM  Component Value Range Status Comment   C difficile by pcr NEGATIVE  NEGATIVE Final     Studies/Results: No results found.  Medications: Scheduled Meds:   . acidophilus  1 capsule Oral Daily  . aspirin EC  81 mg Oral q morning - 10a  . insulin aspart  0-5 Units Subcutaneous QHS  . insulin aspart  0-9 Units Subcutaneous TID WC  . irbesartan  75 mg Oral Daily  . metFORMIN  500 mg Oral Q supper  . methylPREDNISolone (SOLU-MEDROL) injection  30 mg Intravenous Q12H  . valGANciclovir  900  mg Oral BID   Continuous Infusions:  PRN Meds:.acetaminophen, ALPRAZolam, bismuth subsalicylate, ondansetron (ZOFRAN) IV, traZODone  Assessment/Plan:  Principal Problem:  *Intractable diarrhea Active Problems:  Essential hypertension, benign  Type II or unspecified type diabetes mellitus without mention of complication, uncontrolled  Ulcerative colitis  CMV colitis  Dehydration  1. 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 Patient is on IV steroids, valganciclovir. Appreciate GI assistance. Will advance diet to low-residue today. Further medication adjustments per GI.  2. Type 2 diabetes. Currently on metformin and sliding scale insulin.  3. Hypertension. Controlled with Avapro.  4. Dehydration. Improved with IV fluids.  5. Disposition. Anticipate discharge in the next 24 hours if he tolerates the diet.  Time spent coordinating care: 12mns   LOS: 2 days   Jadynn Epping [facility] Pager: [phone]/31/2013, 11:37 AM

## 2012-11-10 ENCOUNTER — Encounter (HOSPITAL_COMMUNITY): Payer: Self-pay | Admitting: Internal Medicine

## 2012-11-10 DIAGNOSIS — E663 Overweight: Secondary | ICD-10-CM | POA: Diagnosis present

## 2012-11-10 DIAGNOSIS — D649 Anemia, unspecified: Secondary | ICD-10-CM

## 2012-11-10 DIAGNOSIS — D696 Thrombocytopenia, unspecified: Secondary | ICD-10-CM | POA: Diagnosis present

## 2012-11-10 HISTORY — DX: Thrombocytopenia, unspecified: D69.6

## 2012-11-10 LAB — CBC WITH DIFFERENTIAL/PLATELET
Basophils Relative: 0 % (ref 0–1)
Eosinophils Absolute: 0.1 10*3/uL (ref 0.0–0.7)
Eosinophils Relative: 1 % (ref 0–5)
HCT: 35.1 % — ABNORMAL LOW (ref 39.0–52.0)
Hemoglobin: 11.9 g/dL — ABNORMAL LOW (ref 13.0–17.0)
Neutrophils Relative %: 73 % (ref 43–77)
RBC: 3.69 MIL/uL — ABNORMAL LOW (ref 4.22–5.81)
WBC: 7.8 10*3/uL (ref 4.0–10.5)

## 2012-11-10 LAB — GLUCOSE, CAPILLARY: Glucose-Capillary: 108 mg/dL — ABNORMAL HIGH (ref 70–99)

## 2012-11-10 MED ORDER — MESALAMINE 400 MG PO CPDR: 800.0000 mg | DELAYED_RELEASE_CAPSULE | Freq: Two times a day (BID) | ORAL | Status: DC

## 2012-11-10 MED ORDER — BISMUTH SUBSALICYLATE 262 MG/15ML PO SUSP: 30.0000 mL | Freq: Four times a day (QID) | ORAL | Status: DC | PRN

## 2012-11-10 MED ORDER — PREDNISONE 10 MG PO TABS: 20.0000 mg | ORAL_TABLET | Freq: Two times a day (BID) | ORAL | Status: DC

## 2012-11-10 NOTE — Discharge Summary (Signed)
Physician Discharge Summary  Manuel Atkins POE:[id number] DOB: 1932/11/26 DOA: 11/07/2012  PCP: Gar Ponto, MD  Admit date: 11/07/2012 Discharge date: 11/10/2012  Time spent: Greater than 30 minutes  Recommendations for Outpatient Follow-up:  1. The patient was instructed to followup with Dr. Laural Golden in 2 weeks and with his primary care physician Dr. Quillian Quince in one week.  Discharge Diagnoses:  1. 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 2. Dehydration and hypovolemic hyponatremia.. 3. Chronic thrombocytopenia. The patient's platelet count ranged from 139 on admission to 107 at the time of discharge. 4. Anemia, possibly dilutional versus medication induced. 5. Type 2 diabetes mellitus. Hemoglobin A1c was 6.2. 6. Hypertension.  7. Overweight. 8. Mild bradycardia.  Discharge Condition: Improved and stable.  Diet recommendation: Carbohydrate modified.  Filed Weights   11/07/12 2041 11/09/12 0500 11/10/12 0414  Weight: 83.915 kg (185 lb) 92.216 kg (203 lb 4.8 oz) 93.622 kg (206 lb 6.4 oz)    History of present illness:  The patient is a 79 year old man with a known history of ulcerated colitis and recent diagnosis of CMV colitis per flexible sigmoidoscopy on 11/01/2012 by Dr. Melony Overly, who presented to the emergency department on 11/07/2012 with a chief complaint of intractable diarrhea. He also complained of progressive weakness from the diarrhea. In the emergency department, he was afebrile and hemodynamically stable. His lab data were significant for a serum sodium of 128, glucose of 230, WBC of 11.3, and platelet count of 139. He was admitted for further evaluation and management.  Hospital Course:  The patient was restarted on valganciclovir and his probiotic. Pepto-Bismol was added to his regimen to help make his stool more formed. IV fluid hydration was given. A clear liquid diet was started but was not advanced until he was clinically  improved. He was restarted on most of not all of his other chronic medications. IV  Solu-Medrol was added later. While on IV Solu-Medrol, sliding scale NovoLog was added to metformin for treatment of his diabetes. For further evaluation, C. difficile by PCR was ordered. It was negative. His gastroenterologist, Dr. Laural Golden was consulted. He agreed with medical management, but he added mesalamine and ordered a hepatitis  B surface antigen. The hepatitis B surface antigen was negative.  With hydration, the patient's serum sodium improved. His platelet count remained low but not in a critical range. In looking back at his laboratory data from previous hospitalizations, it appeared that he has chronic thrombocytopenia. The exact etiology is unknown, but could be related to his ulcerative colitis or his medication regimen. His hemoglobin was within normal limits up until the day of discharge. The decrease may have been secondary to the dilutional effects of the IV fluids. There was no evidence of gross hematochezia or melena.  The patient improved clinically and symptomatically. His diarrhea subsided. He had one to 2 small loose bowel movements daily for the last 2 days of the hospitalization. His diet was advanced which he tolerated well. He had no nausea or vomiting at all during the hospitalization. Dr. Laural Golden advised that the patient be discharged on valganciclovir at its previous dose, prednisone 20 mg twice a day, and mesalamine 1.6 g twice a day. He was discharged in improved and stable condition.    Procedures: None  Consultations:  None  Discharge Exam: Filed Vitals:   11/10/12 0602 11/10/12 0604 11/10/12 0850 11/10/12 0940  BP: 125/75 145/79  103/62  Pulse: 52 53 59 56  Temp:  97.1 F (36.2 C)  TempSrc:    Oral  Resp:    20  Height:      Weight:      SpO2:   92%     General: Pleasant alert 79 year old man sitting up in bed, in no acute distress. Cardiovascular: S1, S2, with a 1-0/0  systolic murmur. Respiratory: Clear to auscultation bilaterally. Abdomen: Positive bowel sounds, soft, nontender, nondistended.  Discharge Instructions  Discharge Orders    Future Appointments: Provider: Department: Dept Phone: Center:   11/16/2012 7:45 AM Renato Shin, MD [facility] [phone] None   11/30/2012 11:30 AM Rogene Houston, MD [place] [phone] None   12/14/2012 9:30 AM Rogene Houston, MD [place] [facility] [phone] None     Future Orders Please Complete By Expires   Diet Carb Modified      Increase activity slowly      Discharge instructions      Comments:   Take an extra dose of metformin if your blood sugars are consistently above 170. Check your blood sugars at home at least 2 times daily.       Medication List     As of 11/10/2012 11:43 AM    STOP taking these medications         predniSONE 10 MG tablet   Commonly known as: STERAPRED UNI-PAK      TAKE these medications         ALPRAZolam 0.25 MG tablet   Commonly known as: XANAX   Take 1 tablet (0.25 mg total) by mouth 3 (three) times daily as needed for sleep.      aspirin EC 81 MG tablet   Take 81 mg by mouth every morning.      bismuth subsalicylate 881 JS/31RX suspension   Commonly known as: PEPTO BISMOL   Take 30 mLs by mouth every 6 (six) hours as needed for indigestion (And diarrhea).      lactobacillus acidophilus Tabs   Take 1 tablet by mouth daily.      Mesalamine 400 MG Cpdr   Commonly known as: ASACOL   Take 2 capsules (800 mg total) by mouth 2 (two) times daily.      metFORMIN 500 MG 24 hr tablet   Commonly known as: GLUCOPHAGE-XR   Take 500 mg by mouth daily with supper.      predniSONE 10 MG tablet   Commonly known as: DELTASONE   Take 2 tablets (20 mg total) by mouth 2 (two) times daily. For 2 weeks and and then, as directed by Dr. Laural Golden.      valGANciclovir 450 MG tablet   Commonly known as: VALCYTE    Take 900 mg by mouth 2 (two) times daily.      valsartan 80 MG tablet   Commonly known as: DIOVAN   Take 40 mg by mouth daily.           Follow-up Information    Follow up with REHMAN,NAJEEB U, MD. Schedule an appointment as soon as possible for a visit in 2 weeks.   Contact information:   [address], [address] [place] [address] [phone]       Follow up with Gar Ponto, MD. Schedule an appointment as soon as possible for a visit in 1 week.   Contact information:   [place] [place] [address] [phone]           The results of  significant diagnostics from this hospitalization (including imaging, microbiology, ancillary and laboratory) are listed below for reference.    Significant Diagnostic Studies: No results found.  Microbiology: Recent Results (from the past 240 hour(s))  CLOSTRIDIUM DIFFICILE BY PCR     Status: Normal   Collection Time   11/08/12 10:05 AM      Component Value Range Status Comment   C difficile by pcr NEGATIVE  NEGATIVE Final      Labs: Basic Metabolic Panel:  Lab 85/69/43 0518 11/08/12 0515 11/07/12 2219  NA 136 134* 128*  K 4.9 4.8 4.3  CL 107 101 93*  CO2 23 24 25   GLUCOSE 167* 200* 230*  BUN 15 18 22   CREATININE 0.78 0.90 1.19  CALCIUM 7.9* 8.2* 8.5  MG -- -- 2.0  PHOS -- -- --   Liver Function Tests:  Lab 11/08/12 0515  AST 14  ALT 24  ALKPHOS 52  BILITOT 0.9  PROT 6.6  ALBUMIN 2.7*   No results found for this basename: LIPASE:5,AMYLASE:5 in the last 168 hours No results found for this basename: AMMONIA:5 in the last 168 hours CBC:  Lab 11/10/12 0540 11/08/12 0858 11/08/12 0515 11/07/12 2219  WBC 7.8 7.1 6.5 11.3*  NEUTROABS 5.7 6.4 -- 10.1*  HGB 11.9* 13.2 14.0 14.6  HCT 35.1* 38.8* 41.5 42.0  MCV 95.1 94.9 94.7 93.8  PLT 107* 118* 108* 139*   Cardiac Enzymes: No results found for this basename: CKTOTAL:5,CKMB:5,CKMBINDEX:5,TROPONINI:5 in the last 168 hours BNP: BNP (last 3 results) No  results found for this basename: PROBNP:3 in the last 8760 hours CBG:  Lab 11/10/12 0737 11/09/12 2138 11/09/12 1622 11/09/12 1143 11/09/12 0732  GLUCAP 108* 110* 179* 207* 139*       Signed:  Sandon Yoho  [facility] 11/10/2012, 11:43 AM

## 2012-11-10 NOTE — Progress Notes (Signed)
Discharge instructions given to pt. With teach back given to RN. Pt. Taken to car via W/C.

## 2012-11-15 ENCOUNTER — Ambulatory Visit (INDEPENDENT_AMBULATORY_CARE_PROVIDER_SITE_OTHER): Payer: Medicare Other | Admitting: Endocrinology

## 2012-11-15 DIAGNOSIS — R209 Unspecified disturbances of skin sensation: Secondary | ICD-10-CM

## 2012-11-15 DIAGNOSIS — R200 Anesthesia of skin: Secondary | ICD-10-CM

## 2012-11-15 MED ORDER — BROMOCRIPTINE MESYLATE 2.5 MG PO TABS: 2.5000 mg | ORAL_TABLET | Freq: Every day | ORAL | Status: DC

## 2012-11-15 NOTE — Patient Instructions (Addendum)
Please change metformin to "bromocriptine," to help your blood sugar. It has possible side effects of nausea and dizziness.  These go away with time.  You can avoid these by taking it at bedtime, and by taking just take 1/2 pill for the first week.   check your blood sugar once a day.  vary the time of day when you check, between before the 3 meals, and at bedtime.  also check if you have symptoms of your blood sugar being too high or too low.  please keep a record of the readings and bring it to your next appointment here.  please call us sooner if your blood sugar goes below 70, or if you have a lot of readings over 200.  If this happens, we can add "[place]." If your colitis resolves, we can consider going back to the metformin in the future. Please come back for a follow-up appointment in 3 months.
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
## 2012-11-16 ENCOUNTER — Ambulatory Visit: Payer: Medicare Other | Admitting: Endocrinology

## 2012-11-16 LAB — VITAMIN B12: Vitamin B-12: 783 pg/mL (ref 211–911)

## 2012-11-23 ENCOUNTER — Other Ambulatory Visit: Payer: Self-pay | Admitting: Endocrinology

## 2012-11-23 ENCOUNTER — Encounter (INDEPENDENT_AMBULATORY_CARE_PROVIDER_SITE_OTHER): Payer: Self-pay | Admitting: Internal Medicine

## 2012-11-23 ENCOUNTER — Encounter: Payer: Self-pay | Admitting: Endocrinology

## 2012-11-23 MED ORDER — SITAGLIPTIN PHOSPHATE 100 MG PO TABS: 100.0000 mg | ORAL_TABLET | ORAL | Status: DC

## 2012-11-24 ENCOUNTER — Telehealth (INDEPENDENT_AMBULATORY_CARE_PROVIDER_SITE_OTHER): Payer: Self-pay | Admitting: Internal Medicine

## 2012-11-24 DIAGNOSIS — D235 Other benign neoplasm of skin of trunk: Secondary | ICD-10-CM | POA: Diagnosis not present

## 2012-11-24 DIAGNOSIS — L570 Actinic keratosis: Secondary | ICD-10-CM | POA: Diagnosis not present

## 2012-11-24 DIAGNOSIS — Z85820 Personal history of malignant melanoma of skin: Secondary | ICD-10-CM | POA: Diagnosis not present

## 2012-11-24 NOTE — Telephone Encounter (Signed)
Patient called and his medications reviewed. He is off metformin and now on Januvia and bromocriptine for his diabetes. He will drop prednisone to 15 mg by mouth twice a day. He has office visit next week when further changes will be made in his treatment.

## 2012-11-30 ENCOUNTER — Encounter (INDEPENDENT_AMBULATORY_CARE_PROVIDER_SITE_OTHER): Payer: Self-pay | Admitting: Internal Medicine

## 2012-11-30 ENCOUNTER — Ambulatory Visit (INDEPENDENT_AMBULATORY_CARE_PROVIDER_SITE_OTHER): Payer: Medicare Other | Admitting: Internal Medicine

## 2012-11-30 VITALS — BP 110/70 | HR 74 | Temp 98.9°F | Resp 18 | Ht 72.0 in | Wt 188.1 lb

## 2012-11-30 DIAGNOSIS — B259 Cytomegaloviral disease, unspecified: Secondary | ICD-10-CM

## 2012-11-30 DIAGNOSIS — J189 Pneumonia, unspecified organism: Secondary | ICD-10-CM

## 2012-11-30 DIAGNOSIS — A09 Infectious gastroenteritis and colitis, unspecified: Secondary | ICD-10-CM

## 2012-11-30 DIAGNOSIS — K5190 Ulcerative colitis, unspecified, without complications: Secondary | ICD-10-CM | POA: Diagnosis not present

## 2012-11-30 DIAGNOSIS — A0839 Other viral enteritis: Secondary | ICD-10-CM

## 2012-11-30 MED ORDER — MESALAMINE 400 MG PO CPDR: 1600.0000 mg | DELAYED_RELEASE_CAPSULE | Freq: Two times a day (BID) | ORAL | Status: DC

## 2012-11-30 MED ORDER — LEVOFLOXACIN 750 MG PO TABS: 750.0000 mg | ORAL_TABLET | Freq: Every day | ORAL | Status: DC

## 2012-11-30 MED ORDER — VALGANCICLOVIR HCL 450 MG PO TABS: 900.0000 mg | ORAL_TABLET | Freq: Every day | ORAL | Status: DC

## 2012-11-30 NOTE — Progress Notes (Signed)
Presenting complaint;  Followup for UC with super added CMV colitis.  Subjective:  Patient is 79 year old Caucasian male who presents for scheduled visit. He was last seen  After Christmas holidays while he was hospitalized. He continues to feel poorly but he states his colitis symptoms have improved. 2 days ago he had nearly normal stool. Now he is having 1-2 stools per day. He has noted mucus but has not passed any blood for the last 2 weeks. He's also not having any abdominal pain. He states his appetite is very good. He has completed 3 weeks of Valganciclovir. Today he complains of having cold symptoms for 2 weeks. He had low-grade fever for the first couple of days. He has cough with brownish and clear sputum. He denies chest pain or shortness of breath. He has not experienced aches and pains. He has noted his glucose levels to be running high. He has Record for the last 10 days and these are range from 149 to 365. Prednisone dose was reduced last week from 40 mg per day to 30 mg per day. He has lost 15 pounds in the last 5 weeks.  Current Medications: Current Outpatient Prescriptions  Medication Sig Dispense Refill  . ALPRAZolam (XANAX) 0.25 MG tablet Take 1 tablet (0.25 mg total) by mouth 3 (three) times daily as needed for sleep.  60 tablet  0  . aspirin EC 81 MG tablet Take 81 mg by mouth every morning.       . bromocriptine (PARLODEL) 2.5 MG tablet Take 1 tablet (2.5 mg total) by mouth at bedtime.  30 tablet  11  . lactobacillus acidophilus (BACID) TABS Take 1 tablet by mouth daily.      . Mesalamine (DELZICOL PO) Take 400 mg by mouth. Patient is taking 2 tablets by mouth 2 times a day      . predniSONE (DELTASONE) 10 MG tablet Take 2 tablets (20 mg total) by mouth 2 (two) times daily. For 2 weeks and and then, as directed by Dr. Laural Golden.  120 tablet  0  . sitaGLIPtin (JANUVIA) 100 MG tablet Take 1 tablet (100 mg total) by mouth every morning.  30 tablet  11  . valsartan (DIOVAN) 80 MG  tablet Take 80 mg by mouth daily.            valganciclovir 900 mg by mouth twice a day.    Objective: Blood pressure 110/70, pulse 74, temperature 98.9 F (37.2 C), temperature source Oral, resp. rate 18, height 6' (1.829 m), weight 188 lb 1.6 oz (85.322 kg). Patient is alert and in no acute distress. Conjunctiva is pink. Sclera is nonicteric Oropharyngeal mucosa is normal with evidence of uvulectomy. No neck masses or thyromegaly noted. Cardiac exam with regular rhythm normal S1 and S2. No murmur or gallop noted. Lungs. He has rhonchi and rales at right base. Abdomen is soft with mild tenderness at LLQ on deep palpation. No organomegaly or masses.  No LE edema or clubbing noted.  Labs/studies Results: CBC from 11/10/2012. WBC 7.8, H&H 11.9 and 35.1 and platelet count 107K. Differential normal.   Assessment:  #1. 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 He finally appears to be responding as far as G I symptoms are concerned. Diarrhea and rectal bleeding has resolved. He has completed 3 weeks of valganciclovir and now will be on maintenance dose. He possibly would need another 3 months of therapy but will get opinion from Dr. Johnnye Sima or associates at [facility]. He needs to be  on higher dose of mesalamine. Will continue prednisone taper which should improve glycemic control. #2. Patient's symptoms and exam suggestive of community-acquired pneumonia. He does not appear to be acutely ill. Condition discussed with his primary care physician Dr. Gar Ponto and will start him on Levaquin. #3. Weakness. TSH level was normal. Suspect this symptom may be secondary to acute illness.   Plan:  Levaquin 750 mg by mouth daily for 10 days. Increase mesalamine dose to 1.6 g by mouth twice a day(Delzicol). Decrease prednisone dose to 20 mg by mouth every morning starting on 12/01/2012 and then drop dose by 5 mg every 7-10 days. He can start consuming fiber rich foods(he was on low  residue diet). Valganciclovir 900 mg by mouth daily. ID consultation with Dr. Johnnye Sima or associates(I have discussed patient's condition with Dr. Johnnye Sima over Christmas holidays). If patient develops fever or shortness of breath he will contact Dr. Gar Ponto. Office visit in one month.

## 2012-11-30 NOTE — Patient Instructions (Signed)
Take prednisone 5 mg by mouth this evening. Starting on 12/01/2012 take prednisone 20 mg by mouth every morning. Decrease Vangancyclovir dose to 900 mg by mouth. Increase Delzicol to 1.6 g by mouth twice daily. Office visit with ID specialist to be arranged

## 2012-12-01 ENCOUNTER — Encounter (INDEPENDENT_AMBULATORY_CARE_PROVIDER_SITE_OTHER): Payer: Self-pay | Admitting: Internal Medicine

## 2012-12-01 ENCOUNTER — Encounter (INDEPENDENT_AMBULATORY_CARE_PROVIDER_SITE_OTHER): Payer: Self-pay | Admitting: *Deleted

## 2012-12-02 DIAGNOSIS — R404 Transient alteration of awareness: Secondary | ICD-10-CM | POA: Diagnosis not present

## 2012-12-02 DIAGNOSIS — Z79899 Other long term (current) drug therapy: Secondary | ICD-10-CM | POA: Diagnosis not present

## 2012-12-02 DIAGNOSIS — B259 Cytomegaloviral disease, unspecified: Secondary | ICD-10-CM | POA: Diagnosis not present

## 2012-12-02 DIAGNOSIS — I10 Essential (primary) hypertension: Secondary | ICD-10-CM | POA: Diagnosis not present

## 2012-12-02 DIAGNOSIS — Z7982 Long term (current) use of aspirin: Secondary | ICD-10-CM | POA: Diagnosis not present

## 2012-12-02 DIAGNOSIS — E119 Type 2 diabetes mellitus without complications: Secondary | ICD-10-CM | POA: Diagnosis not present

## 2012-12-02 DIAGNOSIS — E860 Dehydration: Secondary | ICD-10-CM | POA: Diagnosis not present

## 2012-12-02 DIAGNOSIS — Z800 Family history of malignant neoplasm of digestive organs: Secondary | ICD-10-CM | POA: Diagnosis not present

## 2012-12-02 DIAGNOSIS — Z125 Encounter for screening for malignant neoplasm of prostate: Secondary | ICD-10-CM | POA: Diagnosis not present

## 2012-12-02 DIAGNOSIS — Z801 Family history of malignant neoplasm of trachea, bronchus and lung: Secondary | ICD-10-CM | POA: Diagnosis not present

## 2012-12-02 DIAGNOSIS — K5190 Ulcerative colitis, unspecified, without complications: Secondary | ICD-10-CM | POA: Diagnosis not present

## 2012-12-02 DIAGNOSIS — I959 Hypotension, unspecified: Secondary | ICD-10-CM | POA: Diagnosis not present

## 2012-12-02 DIAGNOSIS — Z809 Family history of malignant neoplasm, unspecified: Secondary | ICD-10-CM | POA: Diagnosis not present

## 2012-12-02 DIAGNOSIS — E440 Moderate protein-calorie malnutrition: Secondary | ICD-10-CM | POA: Diagnosis not present

## 2012-12-02 DIAGNOSIS — R6889 Other general symptoms and signs: Secondary | ICD-10-CM | POA: Diagnosis not present

## 2012-12-02 DIAGNOSIS — Z888 Allergy status to other drugs, medicaments and biological substances status: Secondary | ICD-10-CM | POA: Diagnosis not present

## 2012-12-02 DIAGNOSIS — R5381 Other malaise: Secondary | ICD-10-CM | POA: Diagnosis not present

## 2012-12-02 DIAGNOSIS — G4733 Obstructive sleep apnea (adult) (pediatric): Secondary | ICD-10-CM | POA: Diagnosis not present

## 2012-12-02 DIAGNOSIS — R5383 Other fatigue: Secondary | ICD-10-CM | POA: Diagnosis not present

## 2012-12-07 ENCOUNTER — Encounter (INDEPENDENT_AMBULATORY_CARE_PROVIDER_SITE_OTHER): Payer: Self-pay | Admitting: Internal Medicine

## 2012-12-14 ENCOUNTER — Ambulatory Visit (INDEPENDENT_AMBULATORY_CARE_PROVIDER_SITE_OTHER): Payer: Medicare Other | Admitting: Internal Medicine

## 2012-12-14 ENCOUNTER — Encounter (INDEPENDENT_AMBULATORY_CARE_PROVIDER_SITE_OTHER): Payer: Self-pay | Admitting: Internal Medicine

## 2012-12-20 ENCOUNTER — Other Ambulatory Visit: Payer: Self-pay | Admitting: Internal Medicine

## 2012-12-20 ENCOUNTER — Encounter: Payer: Self-pay | Admitting: Internal Medicine

## 2012-12-20 ENCOUNTER — Ambulatory Visit (INDEPENDENT_AMBULATORY_CARE_PROVIDER_SITE_OTHER): Payer: Medicare Other | Admitting: Internal Medicine

## 2012-12-20 VITALS — BP 119/65 | HR 77 | Temp 97.7°F | Wt 192.0 lb

## 2012-12-20 DIAGNOSIS — A09 Infectious gastroenteritis and colitis, unspecified: Secondary | ICD-10-CM | POA: Diagnosis not present

## 2012-12-20 DIAGNOSIS — B259 Cytomegaloviral disease, unspecified: Secondary | ICD-10-CM | POA: Diagnosis not present

## 2012-12-20 DIAGNOSIS — A0839 Other viral enteritis: Secondary | ICD-10-CM

## 2012-12-20 NOTE — Assessment & Plan Note (Signed)
He seems to be responding well to treatment with ganciclovir.  I am going to check for side effects with a CBC to assure no leukopenia.   I agree with completing a 3 month course total for the colitis.  I am going to check a blood quantitative PCR to see if any circulating virus.  Though not sensitive, it is specific and I would consider a longer course of therapy or re-induction if it was positive.  His much improved symptoms though suggest he is recovering well.    We can follow up with him as needed, thank you for the consultation.

## 2012-12-20 NOTE — Progress Notes (Signed)
ID consult appreciated.

## 2012-12-20 NOTE — Addendum Note (Signed)
Addended by: Dolan Amen D on: 12/20/2012 03:16 PM   Modules accepted: Orders

## 2012-12-20 NOTE — Progress Notes (Signed)
  Subjective:    Patient ID: Manuel Atkins, male    DOB: 1933/09/08, 79 y.o.   MRN: [id number]  HPI He comes in for evaluation for CMV colitis.  He gives me a history of symptoms essentially since the Spring of 2013 of bloody diarrhea and symptoms concerning for an UC flare.  He was placed on 6 MP which seemed to really have exacerbated his symptoms.  He tried hydrocortisone enemas and Uceris He had 3 epidsodes of weakness with emergency room visits requiring hydration and associated with fever.  He was started on mesalamine and then began prednisone.  Due to persistent symptoms, he underwent another flex sig in December and it was positive for CMV colitis.  At that time, he was started on induction therapy with oral Valcyte 900 mg bid for what he believes was three weeks and then maintenance with 900 mg a day which he continues.  His prednisone has been decreased to its lowest recent levels and he is hopeful that it can be further reduced or stopped.  He continues on mesalamine.     Review of Systems  Constitutional: Positive for activity change, fatigue and unexpected weight change. Negative for fever and chills.  Gastrointestinal:       Much improved diarrhea, no blood  Skin: Negative for rash.  Psychiatric/Behavioral: The patient is not nervous/anxious.        Objective:   Physical Exam  Constitutional: He appears well-developed and well-nourished. No distress.  Abdominal: Soft. Bowel sounds are normal. He exhibits no distension. There is no tenderness.          Assessment & Plan:

## 2012-12-21 LAB — CBC WITH DIFFERENTIAL/PLATELET
Eosinophils Absolute: 0 10*3/uL (ref 0.0–0.7)
Hemoglobin: 12.2 g/dL — ABNORMAL LOW (ref 13.0–17.0)
Lymphocytes Relative: 8 % — ABNORMAL LOW (ref 12–46)
Lymphs Abs: 0.9 10*3/uL (ref 0.7–4.0)
MCH: 31 pg (ref 26.0–34.0)
MCV: 93.7 fL (ref 78.0–100.0)
Monocytes Relative: 4 % (ref 3–12)
Neutrophils Relative %: 88 % — ABNORMAL HIGH (ref 43–77)
RBC: 3.94 MIL/uL — ABNORMAL LOW (ref 4.22–5.81)

## 2012-12-24 LAB — CMV DNA, QUANTITATIVE, PCR: Cytomegalovirus, DNA Quant PCR: <363 copies/mL (ref <363)

## 2012-12-25 ENCOUNTER — Other Ambulatory Visit: Payer: Self-pay

## 2012-12-28 ENCOUNTER — Ambulatory Visit (INDEPENDENT_AMBULATORY_CARE_PROVIDER_SITE_OTHER): Payer: Medicare Other | Admitting: Internal Medicine

## 2012-12-28 ENCOUNTER — Encounter (INDEPENDENT_AMBULATORY_CARE_PROVIDER_SITE_OTHER): Payer: Self-pay | Admitting: Internal Medicine

## 2012-12-28 VITALS — BP 114/68 | HR 70 | Temp 94.2°F | Resp 18 | Ht 72.0 in | Wt 189.5 lb

## 2012-12-28 DIAGNOSIS — K5190 Ulcerative colitis, unspecified, without complications: Secondary | ICD-10-CM

## 2012-12-28 DIAGNOSIS — B259 Cytomegaloviral disease, unspecified: Secondary | ICD-10-CM

## 2012-12-28 DIAGNOSIS — A0839 Other viral enteritis: Secondary | ICD-10-CM

## 2012-12-28 DIAGNOSIS — A09 Infectious gastroenteritis and colitis, unspecified: Secondary | ICD-10-CM | POA: Diagnosis not present

## 2012-12-28 MED ORDER — PREDNISONE 10 MG PO TABS: 5.0000 mg | ORAL_TABLET | Freq: Two times a day (BID) | ORAL | Status: DC

## 2012-12-28 NOTE — Patient Instructions (Signed)
Continue prednisone taper as planned. This means you will be off prednisone in 10-11 days.

## 2012-12-28 NOTE — Progress Notes (Signed)
Presenting complaint;  Followup for ulcerative colitis and CMV.  Subjective:  Patient is 79 year old Caucasian male who was UC but superadded CMV. EMB was diagnosed and December 2013 and he has been on valganciclovir since December 2013. Over the weekend he had an episode of nausea vomiting and diarrhea symptoms identical to his wife's. Rectal bleeding has stopped. He has not seen any blood in it he 6 weeks. He is having one stool per day. Stool is still semi-formed. Every now and then he may skip a day. He denies abdominal pain. Last week he was seen by Dr. Scharlene Gloss at [facility] and had serum PCR testing for CMV. He is not having any problem with prednisone taper. He continues to complain of feeling weak. At times he has transient headache on standing up or when he extends his neck. He is back on metformin as recommended by Dr. Gar Ponto since his blood glucose was 516 on 12/03/2012. Since then his blood glucose levels have dropped significantly and has been as low as 97.   Current Medications: Current Outpatient Prescriptions  Medication Sig Dispense Refill  . ALPRAZolam (XANAX) 0.25 MG tablet Take 1 tablet (0.25 mg total) by mouth 3 (three) times daily as needed for sleep.  60 tablet  0  . aspirin EC 81 MG tablet Take 81 mg by mouth every morning.       . lactobacillus acidophilus (BACID) TABS Take 1 tablet by mouth daily.      . Mesalamine (DELZICOL) 400 MG CPDR Take 400 mg by mouth. Patient states that he is taking 4 capsules by mouth twice a day      . metFORMIN (GLUCOPHAGE) 500 MG tablet Take 2,000 mg by mouth daily.      . predniSONE (DELTASONE) 10 MG tablet Take 2 tablets (20 mg total) by mouth 2 (two) times daily. For 2 weeks and and then, as directed by Dr. Laural Golden.  120 tablet  0  . sitaGLIPtin (JANUVIA) 100 MG tablet Take 1 tablet (100 mg total) by mouth every morning.  30 tablet  11  . valGANciclovir (VALCYTE) 450 MG tablet Take 2 tablets (900 mg total) by  mouth daily.  60 tablet  2  . valsartan (DIOVAN) 80 MG tablet Take 80 mg by mouth daily.        No current facility-administered medications for this visit.     Objective: Blood pressure 114/68, pulse 70, temperature 94.2 F (34.6 C), temperature source Oral, resp. rate 18, height 6' (1.829 m), weight 189 lb 8 oz (85.957 kg). Patient is alert and in no acute distress. Conjunctiva is pink. Sclera is nonicteric Oropharyngeal mucosa is normal. No neck masses or thyromegaly noted. Cardiac exam with regular rhythm normal S1 and S2. No murmur or gallop noted. Lungs are clear to auscultation. Abdomen is soft and nontender without organomegaly or masses.  No LE edema or clubbing noted.  Labs/studies Results: From 12/20/2012. WBC 11.4, H&H 12.2 and 36.9 and platelet count 182 K CMV DNA by PCR undetectable(range<363 copy/mL.  Assessment:  #1. Ulcerative colitis with superadded CMV. He appears to be be back in remission. Dr. Henreitta Leber recommendations noted. He does not have detectable CMV in his blood which is reassuring. He will continue prednisone taper. #2. Chronic weakness possibly due to chronic illness and medications.    Plan:  Drop prednisone dose to 5 mg daily after 4 days. Then prednisone 5 mg daily for one week and stop. Continue 17 at 1.6 g by mouth  twice a day. Continue then ganciclovir for one more month and stop. Office visit in 3 months.

## 2012-12-31 DIAGNOSIS — K5100 Ulcerative (chronic) pancolitis without complications: Secondary | ICD-10-CM | POA: Diagnosis not present

## 2012-12-31 DIAGNOSIS — G4733 Obstructive sleep apnea (adult) (pediatric): Secondary | ICD-10-CM | POA: Diagnosis not present

## 2012-12-31 DIAGNOSIS — Z0000 Encounter for general adult medical examination without abnormal findings: Secondary | ICD-10-CM | POA: Diagnosis not present

## 2012-12-31 DIAGNOSIS — I10 Essential (primary) hypertension: Secondary | ICD-10-CM | POA: Diagnosis not present

## 2013-01-03 ENCOUNTER — Other Ambulatory Visit (INDEPENDENT_AMBULATORY_CARE_PROVIDER_SITE_OTHER): Payer: Self-pay | Admitting: Internal Medicine

## 2013-01-03 DIAGNOSIS — E1149 Type 2 diabetes mellitus with other diabetic neurological complication: Secondary | ICD-10-CM | POA: Diagnosis not present

## 2013-01-03 DIAGNOSIS — E119 Type 2 diabetes mellitus without complications: Secondary | ICD-10-CM | POA: Diagnosis not present

## 2013-01-13 DIAGNOSIS — E119 Type 2 diabetes mellitus without complications: Secondary | ICD-10-CM | POA: Diagnosis not present

## 2013-02-14 ENCOUNTER — Ambulatory Visit: Payer: Medicare Other | Admitting: Endocrinology

## 2013-03-14 DIAGNOSIS — E1149 Type 2 diabetes mellitus with other diabetic neurological complication: Secondary | ICD-10-CM | POA: Diagnosis not present

## 2013-03-14 DIAGNOSIS — E119 Type 2 diabetes mellitus without complications: Secondary | ICD-10-CM | POA: Diagnosis not present

## 2013-03-28 ENCOUNTER — Encounter (INDEPENDENT_AMBULATORY_CARE_PROVIDER_SITE_OTHER): Payer: Self-pay | Admitting: Internal Medicine

## 2013-03-28 ENCOUNTER — Ambulatory Visit (INDEPENDENT_AMBULATORY_CARE_PROVIDER_SITE_OTHER): Payer: Medicare Other | Admitting: Internal Medicine

## 2013-03-28 VITALS — BP 116/68 | HR 74 | Temp 97.1°F | Resp 18 | Ht 72.0 in | Wt 196.8 lb

## 2013-03-28 DIAGNOSIS — K5190 Ulcerative colitis, unspecified, without complications: Secondary | ICD-10-CM

## 2013-03-28 DIAGNOSIS — D649 Anemia, unspecified: Secondary | ICD-10-CM | POA: Diagnosis not present

## 2013-03-28 LAB — CBC
HCT: 44.3 % (ref 39.0–52.0)
Hemoglobin: 15.2 g/dL (ref 13.0–17.0)
MCH: 31.2 pg (ref 26.0–34.0)
MCHC: 34.3 g/dL (ref 30.0–36.0)
RBC: 4.87 MIL/uL (ref 4.22–5.81)

## 2013-03-28 MED ORDER — PREDNISONE 10 MG PO TABS: 10.0000 mg | ORAL_TABLET | Freq: Two times a day (BID) | ORAL | Status: DC

## 2013-03-28 MED ORDER — PREDNISONE 10 MG PO TABS: 10.0000 mg | ORAL_TABLET | Freq: Every day | ORAL | Status: DC

## 2013-03-28 NOTE — Patient Instructions (Signed)
Continue prednisone 10 mg daily for 10 days and then 5 mg daily for 10 days and stop. Physician will contact you with the results of blood work.

## 2013-03-28 NOTE — Progress Notes (Signed)
Presenting complaint;  Followup for ulcerative colitis.  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 He was doing well on his last visit. He was treated with valganciclovir for 3 months. He was seen by Dr. Scharlene Gloss of ID service and is CMV by PCR was negative. Patient is accompanied by his wife today. He states he was doing well until about 2 weeks ago and he lost a sister secondary to pancreatic carcinoma. He was very close to his sister. He started passing mucus and bright red blood per rectum. He had few prednisone tablets left in the site to go back on it. He believes he may be feeling better. He is having 4-5 bowel movements per day. Stool consistency varies. He also has urgency. Prior to 2 weeks ago he was having less frequent stools and he also would skip a day every now and then. He has rare nocturnal bowel movement. She denies abdominal pain nausea vomiting fever or chills. He continues to complain of feeling tired and having no energy. However lately he's been walking half a mile twice daily and able to yard work.  Current Medications: Current Outpatient Prescriptions  Medication Sig Dispense Refill  . ALPRAZolam (XANAX) 0.25 MG tablet TAKE ONE TABLET THREE TIMES DAILY AS NEEDED FOR ANXIETY  60 tablet  0  . aspirin EC 81 MG tablet Take 81 mg by mouth every morning.       . lactobacillus acidophilus (BACID) TABS Take 1 tablet by mouth daily.      . Mesalamine (DELZICOL) 400 MG CPDR Take 400 mg by mouth. Patient states that he is taking 4 capsules by mouth twice a day      . metFORMIN (GLUCOPHAGE) 500 MG tablet Take 2,000 mg by mouth daily.      . predniSONE (DELTASONE) 10 MG tablet Take 1 tablet (10 mg total) by mouth daily.  60 tablet  0  . valsartan (DIOVAN) 80 MG tablet Take 80 mg by mouth daily.        No current facility-administered medications for this visit.      Objective: Blood pressure 116/68, pulse 74, temperature 97.1 F (36.2 C), temperature source Oral, resp. rate 18, height 6' (1.829 m), weight 196 lb 12.8 oz (89.268 kg). Patient is alert and in no acute distress. Conjunctiva is pink. Sclera is nonicteric Oropharyngeal mucosa is normal. No neck masses or thyromegaly noted. Cardiac exam with regular rhythm normal S1 and S2. No murmur or gallop noted. Lungs are clear to auscultation. Abdomen is soft and nontender without organomegaly or masses.  No LE edema or clubbing noted.   Assessment:  #1. Patient's symptoms are suggestive of relapse of [facility] he related to stress resulting from loss of his sister. His symptoms are mild and I doubt that he has relapse of CMV. He is taking prednisone at a low dose which he started on his own; he will continue this therapy for few more weeks and see what happens. #2. History of anemia. #3. Weakness. Multifactorial. TSH in the past was normal.    Plan:  Continue Delzicol at 1.6 g by mouth twice a day. Continue prednisone 10 mg daily for 10 days and thereafter 5 mg daily for 10 days and stop. New prescription given for prednisone 10 mg tablets 60 without refill. Patient will go to the lab for CBC and CRP. Office visit in 4 months.

## 2013-03-29 LAB — C-REACTIVE PROTEIN: CRP: 0.6 mg/dL — ABNORMAL HIGH (ref <0.60)

## 2013-04-07 ENCOUNTER — Encounter (INDEPENDENT_AMBULATORY_CARE_PROVIDER_SITE_OTHER): Payer: Self-pay | Admitting: Internal Medicine

## 2013-04-20 ENCOUNTER — Encounter (INDEPENDENT_AMBULATORY_CARE_PROVIDER_SITE_OTHER): Payer: Self-pay | Admitting: Internal Medicine

## 2013-05-05 ENCOUNTER — Encounter (INDEPENDENT_AMBULATORY_CARE_PROVIDER_SITE_OTHER): Payer: Self-pay | Admitting: Internal Medicine

## 2013-05-19 ENCOUNTER — Encounter (INDEPENDENT_AMBULATORY_CARE_PROVIDER_SITE_OTHER): Payer: Self-pay | Admitting: Internal Medicine

## 2013-05-20 ENCOUNTER — Telehealth (INDEPENDENT_AMBULATORY_CARE_PROVIDER_SITE_OTHER): Payer: Self-pay | Admitting: *Deleted

## 2013-05-20 NOTE — Telephone Encounter (Signed)
Mr.Bologna had sent a message to the office on  05/20/13. His UC has flared. Per Dr.Rehman the patient should start back on Prednisone 10 mg my both everyday. He should be brought into the office next week to see Dr.Rehman. Per Dr.Rehman's instruction I called in the Prednisone 10 mg as noted above to [facility]. Patient was called and made aware and per Butch Penny was given an appointment for July 15 ,Tuesday @ 9 am with Dr.Rehman.

## 2013-05-24 ENCOUNTER — Other Ambulatory Visit: Payer: Self-pay

## 2013-05-24 ENCOUNTER — Encounter (INDEPENDENT_AMBULATORY_CARE_PROVIDER_SITE_OTHER): Payer: Self-pay | Admitting: Internal Medicine

## 2013-05-24 ENCOUNTER — Ambulatory Visit (INDEPENDENT_AMBULATORY_CARE_PROVIDER_SITE_OTHER): Payer: Medicare Other | Admitting: Internal Medicine

## 2013-05-24 VITALS — BP 126/72 | HR 72 | Temp 96.8°F | Resp 32 | Ht 72.0 in | Wt 199.5 lb

## 2013-05-24 DIAGNOSIS — I10 Essential (primary) hypertension: Secondary | ICD-10-CM | POA: Diagnosis not present

## 2013-05-24 DIAGNOSIS — R1310 Dysphagia, unspecified: Secondary | ICD-10-CM

## 2013-05-24 DIAGNOSIS — K5190 Ulcerative colitis, unspecified, without complications: Secondary | ICD-10-CM | POA: Diagnosis not present

## 2013-05-24 DIAGNOSIS — R5381 Other malaise: Secondary | ICD-10-CM | POA: Diagnosis not present

## 2013-05-24 DIAGNOSIS — E782 Mixed hyperlipidemia: Secondary | ICD-10-CM | POA: Diagnosis not present

## 2013-05-24 NOTE — Progress Notes (Signed)
Presenting complaint;  Followup for ulcerative colitis. Patient also complains of dysphagia.  Subjective:  Patient is 79 year old Caucasian male with history of ulcerative colitis. In December 2013 he was also diagnosed to have concomitant CMV colitis and received 3 months of antiviral therapy. He was doing well until about 10 days ago when he noted rectal bleeding and excessive mucus per rectum. He also noted increase in frequency of his bowel movements. He called the office last week and was asked to go back on prednisone which was 4 days ago. He is starting to feel better. He is still having 3-4 stools per day. Stools are loose to soft. He also complains of abdominal cramps and "stomach ache". He denies recent antibiotic use. He has good appetite and his weight has been stable. He has not noted significant bump in glucose levels since he was placed on prednisone. She also complains of dysphagia primarily to solids. He's had this symptom for a few months but lately experienced more often. According to his wife he said few episodes of food impaction relieved with regurgitation. He denies heartburn.  Current Medications: Current Outpatient Prescriptions  Medication Sig Dispense Refill  . ALPRAZolam (XANAX) 0.25 MG tablet TAKE ONE TABLET THREE TIMES DAILY AS NEEDED FOR ANXIETY  60 tablet  0  . aspirin EC 81 MG tablet Take 81 mg by mouth every morning.       . lactobacillus acidophilus (BACID) TABS Take 1 tablet by mouth daily.      . Mesalamine (DELZICOL) 400 MG CPDR Take 400 mg by mouth. Patient states that he is taking 4 capsules by mouth twice a day      . metFORMIN (GLUCOPHAGE) 500 MG tablet Take 2,000 mg by mouth daily.      . predniSONE (DELTASONE) 10 MG tablet Take 1 tablet (10 mg total) by mouth daily.  60 tablet  0  . valsartan (DIOVAN) 80 MG tablet Take 80 mg by mouth daily.        No current facility-administered medications for this visit.     Objective: Blood pressure 126/72,  pulse 72, temperature 96.8 F (36 C), temperature source Oral, resp. rate 32, height 6' (1.829 m), weight 199 lb 8 oz (90.493 kg). Patient is alert and in no acute distress. Conjunctiva is pink. Sclera is nonicteric Oropharyngeal mucosa is normal. No neck masses or thyromegaly noted. Cardiac exam with regular rhythm normal S1 and S2. No murmur or gallop noted. Lungs are clear to auscultation. Abdomen symmetrical soft without organomegaly or masses. He has mild tenderness in both iliac fossae.  No LE edema or clubbing noted.    Assessment:  #1. Ulcerative colitis. Patient's symptoms suggest relapse. He is doing better with low-dose prednisone which she is tolerating well. He has failed budesonide and intolerant of 6-MP. He may eventually have to be treated with biologics. #2. Dysphagia. He possibly has a Schatzki's ring but he could also have motility disorder.    Plan:  Continue prednisone at 10 mg by mouth every morning. CBC and CRP in 4 weeks. Barium pill esophagogram. Office visit in 8 weeks.

## 2013-05-24 NOTE — Patient Instructions (Signed)
Blood work on 06/20/2013. Drop prednisone dose to 5 mg daily after 4 weeks. Physician will contact you with results of barium study and further recommendations

## 2013-05-30 DIAGNOSIS — E1149 Type 2 diabetes mellitus with other diabetic neurological complication: Secondary | ICD-10-CM | POA: Diagnosis not present

## 2013-05-30 DIAGNOSIS — E119 Type 2 diabetes mellitus without complications: Secondary | ICD-10-CM | POA: Diagnosis not present

## 2013-05-31 ENCOUNTER — Ambulatory Visit (HOSPITAL_COMMUNITY)
Admission: RE | Admit: 2013-05-31 | Discharge: 2013-05-31 | Disposition: A | Discharge status: Home / Self Care | Payer: Medicare Other | Source: Ambulatory Visit | Attending: Internal Medicine | Admitting: Internal Medicine

## 2013-05-31 DIAGNOSIS — R1310 Dysphagia, unspecified: Secondary | ICD-10-CM | POA: Insufficient documentation

## 2013-05-31 DIAGNOSIS — E782 Mixed hyperlipidemia: Secondary | ICD-10-CM | POA: Diagnosis not present

## 2013-05-31 DIAGNOSIS — E119 Type 2 diabetes mellitus without complications: Secondary | ICD-10-CM | POA: Diagnosis not present

## 2013-05-31 DIAGNOSIS — I10 Essential (primary) hypertension: Secondary | ICD-10-CM | POA: Diagnosis not present

## 2013-05-31 DIAGNOSIS — K5100 Ulcerative (chronic) pancolitis without complications: Secondary | ICD-10-CM | POA: Diagnosis not present

## 2013-05-31 DIAGNOSIS — G4733 Obstructive sleep apnea (adult) (pediatric): Secondary | ICD-10-CM | POA: Diagnosis not present

## 2013-06-02 ENCOUNTER — Other Ambulatory Visit (INDEPENDENT_AMBULATORY_CARE_PROVIDER_SITE_OTHER): Payer: Self-pay | Admitting: *Deleted

## 2013-06-02 ENCOUNTER — Encounter (INDEPENDENT_AMBULATORY_CARE_PROVIDER_SITE_OTHER): Payer: Self-pay | Admitting: *Deleted

## 2013-06-02 DIAGNOSIS — R1310 Dysphagia, unspecified: Secondary | ICD-10-CM

## 2013-06-02 DIAGNOSIS — K222 Esophageal obstruction: Secondary | ICD-10-CM

## 2013-06-07 ENCOUNTER — Encounter (INDEPENDENT_AMBULATORY_CARE_PROVIDER_SITE_OTHER): Payer: Self-pay | Admitting: Internal Medicine

## 2013-06-07 ENCOUNTER — Telehealth (INDEPENDENT_AMBULATORY_CARE_PROVIDER_SITE_OTHER): Payer: Self-pay | Admitting: *Deleted

## 2013-06-07 NOTE — Telephone Encounter (Signed)
UC not responding to med's. Emegencies/accidents a daily occurance, usually in A M. I have lost 6 lbs since I saw you on 7/15. I think it would be good if you could call me. I am fairly sure I saw 2 of the Delzicols in stool today, but could not check at the time as you suggested. Thank you.

## 2013-06-08 ENCOUNTER — Other Ambulatory Visit (INDEPENDENT_AMBULATORY_CARE_PROVIDER_SITE_OTHER): Payer: Self-pay | Admitting: *Deleted

## 2013-06-08 ENCOUNTER — Encounter (HOSPITAL_COMMUNITY): Payer: Self-pay | Admitting: Pharmacy Technician

## 2013-06-08 DIAGNOSIS — R1310 Dysphagia, unspecified: Secondary | ICD-10-CM

## 2013-06-08 DIAGNOSIS — K51918 Ulcerative colitis, unspecified with other complication: Secondary | ICD-10-CM

## 2013-06-08 DIAGNOSIS — K222 Esophageal obstruction: Secondary | ICD-10-CM

## 2013-06-08 DIAGNOSIS — R197 Diarrhea, unspecified: Secondary | ICD-10-CM

## 2013-06-08 NOTE — Telephone Encounter (Signed)
Talk with patient earlier today. He has 2 weeks of Rowasa enemas which he will start using. Will consider flexible sigmoidoscopy at the time of EGD. Once the recurrent CMV colitis ruled out he would be a candidate for biologic therapy.

## 2013-06-09 NOTE — Telephone Encounter (Signed)
Flex sig added to EGD

## 2013-06-15 ENCOUNTER — Other Ambulatory Visit: Payer: Self-pay

## 2013-06-16 ENCOUNTER — Ambulatory Visit: Admit: 2013-06-16 | Payer: Self-pay | Admitting: Internal Medicine

## 2013-06-16 ENCOUNTER — Encounter (HOSPITAL_COMMUNITY)
Admission: RE | Disposition: A | Discharge status: Home / Self Care | Payer: Self-pay | Source: Ambulatory Visit | Attending: Internal Medicine

## 2013-06-16 ENCOUNTER — Encounter (HOSPITAL_COMMUNITY): Payer: Self-pay | Admitting: *Deleted

## 2013-06-16 ENCOUNTER — Ambulatory Visit (HOSPITAL_COMMUNITY)
Admission: RE | Admit: 2013-06-16 | Discharge: 2013-06-16 | Disposition: A | Discharge status: Home / Self Care | Payer: Medicare Other | Source: Ambulatory Visit | Attending: Internal Medicine | Admitting: Internal Medicine

## 2013-06-16 DIAGNOSIS — K269 Duodenal ulcer, unspecified as acute or chronic, without hemorrhage or perforation: Secondary | ICD-10-CM

## 2013-06-16 DIAGNOSIS — K5180 Other ulcerative colitis without complications: Secondary | ICD-10-CM | POA: Diagnosis not present

## 2013-06-16 DIAGNOSIS — K5190 Ulcerative colitis, unspecified, without complications: Secondary | ICD-10-CM | POA: Diagnosis not present

## 2013-06-16 DIAGNOSIS — Z01812 Encounter for preprocedural laboratory examination: Secondary | ICD-10-CM | POA: Insufficient documentation

## 2013-06-16 DIAGNOSIS — K2960 Other gastritis without bleeding: Secondary | ICD-10-CM | POA: Diagnosis not present

## 2013-06-16 DIAGNOSIS — K222 Esophageal obstruction: Secondary | ICD-10-CM | POA: Insufficient documentation

## 2013-06-16 DIAGNOSIS — K259 Gastric ulcer, unspecified as acute or chronic, without hemorrhage or perforation: Secondary | ICD-10-CM

## 2013-06-16 DIAGNOSIS — R1310 Dysphagia, unspecified: Secondary | ICD-10-CM

## 2013-06-16 DIAGNOSIS — R197 Diarrhea, unspecified: Secondary | ICD-10-CM | POA: Diagnosis not present

## 2013-06-16 DIAGNOSIS — K449 Diaphragmatic hernia without obstruction or gangrene: Secondary | ICD-10-CM | POA: Diagnosis not present

## 2013-06-16 DIAGNOSIS — I10 Essential (primary) hypertension: Secondary | ICD-10-CM | POA: Insufficient documentation

## 2013-06-16 DIAGNOSIS — K51918 Ulcerative colitis, unspecified with other complication: Secondary | ICD-10-CM

## 2013-06-16 DIAGNOSIS — K2100 Gastro-esophageal reflux disease with esophagitis, without bleeding: Secondary | ICD-10-CM | POA: Diagnosis not present

## 2013-06-16 DIAGNOSIS — K208 Other esophagitis: Secondary | ICD-10-CM

## 2013-06-16 DIAGNOSIS — K921 Melena: Secondary | ICD-10-CM | POA: Diagnosis not present

## 2013-06-16 DIAGNOSIS — K5120 Ulcerative (chronic) proctitis without complications: Secondary | ICD-10-CM | POA: Diagnosis not present

## 2013-06-16 DIAGNOSIS — IMO0001 Reserved for inherently not codable concepts without codable children: Secondary | ICD-10-CM | POA: Diagnosis not present

## 2013-06-16 HISTORY — PX: ESOPHAGOGASTRODUODENOSCOPY (EGD) WITH ESOPHAGEAL DILATION: SHX5812

## 2013-06-16 HISTORY — PX: FLEXIBLE SIGMOIDOSCOPY: SHX5431

## 2013-06-16 LAB — GLUCOSE, CAPILLARY: Glucose-Capillary: 133 mg/dL — ABNORMAL HIGH (ref 70–99)

## 2013-06-16 SURGERY — ESOPHAGOGASTRODUODENOSCOPY (EGD) WITH ESOPHAGEAL DILATION
Anesthesia: Moderate Sedation

## 2013-06-16 MED ORDER — DEXTROSE 5 % IV SOLN
INTRAVENOUS | Status: DC | PRN
  Administered 2013-06-16: 250 mL via INTRAVENOUS

## 2013-06-16 MED ORDER — STERILE WATER FOR IRRIGATION IR SOLN
Status: DC | PRN
  Administered 2013-06-16: 13:00:00

## 2013-06-16 MED ORDER — PANTOPRAZOLE SODIUM 40 MG PO TBEC: 40.0000 mg | DELAYED_RELEASE_TABLET | Freq: Every day | ORAL | Status: DC

## 2013-06-16 MED ORDER — MEPERIDINE HCL 25 MG/ML IJ SOLN
INTRAMUSCULAR | Status: DC | PRN
  Administered 2013-06-16 (×2): 25 mg via INTRAVENOUS

## 2013-06-16 MED ORDER — MIDAZOLAM HCL 5 MG/5ML IJ SOLN
INTRAMUSCULAR | Status: AC
  Filled 2013-06-16: qty 10

## 2013-06-16 MED ORDER — MEPERIDINE HCL 50 MG/ML IJ SOLN
INTRAMUSCULAR | Status: AC
  Filled 2013-06-16: qty 1

## 2013-06-16 MED ORDER — SODIUM CHLORIDE 0.9 % IV SOLN
INTRAVENOUS | Status: DC
  Administered 2013-06-16: 13:00:00 via INTRAVENOUS

## 2013-06-16 MED ORDER — MIDAZOLAM HCL 5 MG/5ML IJ SOLN
INTRAMUSCULAR | Status: DC | PRN
  Administered 2013-06-16: 1 mg via INTRAVENOUS
  Administered 2013-06-16 (×2): 2 mg via INTRAVENOUS

## 2013-06-16 MED ORDER — BUTAMBEN-TETRACAINE-BENZOCAINE 2-2-14 % EX AERO
INHALATION_SPRAY | CUTANEOUS | Status: DC | PRN
  Administered 2013-06-16: 2 via TOPICAL
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
## 2013-06-16 NOTE — Progress Notes (Signed)
h pylori drawn and sent to lab.

## 2013-06-16 NOTE — Op Note (Signed)
EGD AND FLEXIBLE SIGMOIDOSCOPY PROCEDURE REPORT  PATIENT:  Manuel Atkins  MR#:  [id number] Birthdate:  Dec 01, 1932, 79 y.o., male Endoscopist:  Dr. Rogene Houston, MD Referred By:  Dr. Gar Ponto, MD Procedure Date: 06/16/2013  Procedure:   EGD, ED  & flexible sigmoidoscopy.  Indications:  Patient is 79 year old Caucasian male who presents with dysphagia and relapse of bloody diarrhea. He has history of chronic UC with superadded CMV. Recent barium study suggested distal esophageal stricture.            Informed Consent:  The risks, benefits, alternatives & imponderables which include, but are not limited to, bleeding, infection, perforation, drug reaction and potential missed lesion have been reviewed.  The potential for biopsy, lesion removal, esophageal dilation, etc. have also been discussed.  Questions have been answered.  All parties agreeable.  Please see history & physical in medical record for more information.  Medications:  Demerol 50 mg IV Versed 5 mg IV Cetacaine spray topically for oropharyngeal anesthesia  EGD  Description of procedure:  The endoscope was introduced through the mouth and advanced to the second portion of the duodenum without difficulty or limitations. The mucosal surfaces were surveyed very carefully during advancement of the scope and upon withdrawal.  Findings:  Esophagus:  Mucosa of the esophagus was normal except 2 erosions noted distally close to GE junction. Mucosal edema and erythema noted at GE junction. Noncritical narrowing noted at GE junction. GEJ:  37 cm Hiatus:  39 cm Stomach:  Stomach was empty and distended very well with insufflation. Folds in the proximal stomach were normal. Mucosa and gastric body was normal. Few antral erosions noted. Pyloric channel was patent. Andress fundus and cardia were unremarkable. Duodenum:  Small linear ulcer noted along medial wall of the duodenal bulb. Post bulbar duodenal mucosa was  normal.  Therapeutic/Diagnostic Maneuvers Performed:   Dysphagia dilation attempted with [facility] dilator but was not able to pass it past the hypopharynx. Therefore distal esophageal stricture was dilated with balloon dilator. Endoscope was reintroduced into the stomach. The balloon dilator was passed with the scope. The guidewire was pushed into the gastric lumen. The balloon dilator was positioned across stricture and insufflated a dilator of 15, 16.5 and finally to 18 mm. Small superficial linear mucosal destruction noted proximal to GE junction. The balloon was deflated and withdrawn and endoscope was also removed.  FLEXIBLE SIGMOIDOSCOPY: Description of procedure:  After a digital rectal exam was performed, that colonoscope was advanced from the anus through the rectum and colon to the area of distal transverse colon.  As the scope was withdrawn mucosal surfaces were carefully surveyed utilizing scope tip to flexion to facilitate fold flattening as needed. The scope was pulled down into the rectum where a thorough exam performed. Findings:   Diffuse involvement of rectal and sigmoid colon mucosa with erosions ulcers erythema edema and friability. Less pronounced changes are noted at descending and distal transverse colon.  Therapeutic/Diagnostic Maneuvers Performed:  Biopsy was taken from mucosa of sigmoid colon rectum and submitted together as inflammatory changes are very identical.  Complications:  None    Impression:  Erosive reflux esophagitis with soft stricture at GE junction. Stricture dilated with  balloon to 18 mm. Small sliding hiatal hernia. Erosive antral gastritis. Small bulbar ulcer. Active colitis with diffuse changes involving rectum and sigmoid colon and mild changes noted at descending colon and distal transverse colon.  Biopsy taken from mucosa of sigmoid colon rectum and submitted together.  Recommendations:  H. pylori serology. Hold aspirin for now  . Pantoprazole 40 mg by mouth every morning . I will be contacting patient results of biopsy and blood test and further recommendations.  Kiannah Grunow U  06/16/2013 1:41 PM  CC: Dr. Gar Ponto, MD & Dr. Rayne Du ref. provider found

## 2013-06-17 LAB — H. PYLORI ANTIBODY, IGG: H Pylori IgG: <0.4 {ISR}

## 2013-06-21 ENCOUNTER — Encounter (HOSPITAL_COMMUNITY): Payer: Self-pay | Admitting: Internal Medicine

## 2013-06-24 ENCOUNTER — Encounter (HOSPITAL_COMMUNITY): Payer: Self-pay

## 2013-06-24 ENCOUNTER — Encounter (HOSPITAL_COMMUNITY)
Admission: RE | Admit: 2013-06-24 | Discharge: 2013-06-24 | Disposition: A | Discharge status: Home / Self Care | Payer: Medicare Other | Source: Ambulatory Visit | Attending: Internal Medicine | Admitting: Internal Medicine

## 2013-06-24 DIAGNOSIS — K5090 Crohn's disease, unspecified, without complications: Secondary | ICD-10-CM | POA: Insufficient documentation

## 2013-06-24 MED ORDER — SODIUM CHLORIDE 0.9 % IV SOLN
INTRAVENOUS | Status: DC
  Administered 2013-06-24: 10:00:00 via INTRAVENOUS

## 2013-06-24 MED ORDER — SODIUM CHLORIDE 0.9 % IV SOLN
500.0000 mg | Freq: Once | INTRAVENOUS | Status: AC
  Administered 2013-06-24: 500 mg via INTRAVENOUS
  Filled 2013-06-24: qty 50

## 2013-06-24 NOTE — Progress Notes (Signed)
Pt. For induction Remicade infusion.  Infusion titrated per Dr. Olevia Perches order.  Pt. Tolerated infusion well without incident.

## 2013-06-27 ENCOUNTER — Encounter (INDEPENDENT_AMBULATORY_CARE_PROVIDER_SITE_OTHER): Payer: Self-pay | Admitting: *Deleted

## 2013-07-08 ENCOUNTER — Other Ambulatory Visit (INDEPENDENT_AMBULATORY_CARE_PROVIDER_SITE_OTHER): Payer: Self-pay | Admitting: Internal Medicine

## 2013-07-08 ENCOUNTER — Encounter (HOSPITAL_COMMUNITY)
Admission: RE | Admit: 2013-07-08 | Discharge: 2013-07-08 | Disposition: A | Discharge status: Home / Self Care | Payer: Medicare Other | Source: Ambulatory Visit | Attending: Internal Medicine | Admitting: Internal Medicine

## 2013-07-08 DIAGNOSIS — K5190 Ulcerative colitis, unspecified, without complications: Secondary | ICD-10-CM | POA: Insufficient documentation

## 2013-07-08 MED ORDER — ACETAMINOPHEN 325 MG PO TABS
ORAL_TABLET | ORAL | Status: AC
  Filled 2013-07-08: qty 2

## 2013-07-08 MED ORDER — LORATADINE 10 MG PO TABS
10.0000 mg | ORAL_TABLET | Freq: Every day | ORAL | Status: DC
  Administered 2013-07-08: 10 mg via ORAL

## 2013-07-08 MED ORDER — SODIUM CHLORIDE 0.9 % IV SOLN
INTRAVENOUS | Status: DC
  Administered 2013-07-08: 10:00:00 via INTRAVENOUS

## 2013-07-08 MED ORDER — SODIUM CHLORIDE 0.9 % IV SOLN
500.0000 mg | Freq: Once | INTRAVENOUS | Status: AC
  Administered 2013-07-08: 500 mg via INTRAVENOUS
  Filled 2013-07-08: qty 50

## 2013-07-08 MED ORDER — LORATADINE 10 MG PO TABS
ORAL_TABLET | ORAL | Status: AC
  Filled 2013-07-08: qty 1

## 2013-07-08 MED ORDER — ACETAMINOPHEN 325 MG PO TABS
650.0000 mg | ORAL_TABLET | Freq: Four times a day (QID) | ORAL | Status: DC | PRN
  Administered 2013-07-08: 650 mg via ORAL

## 2013-07-15 ENCOUNTER — Other Ambulatory Visit (INDEPENDENT_AMBULATORY_CARE_PROVIDER_SITE_OTHER): Payer: Self-pay | Admitting: Internal Medicine

## 2013-07-15 ENCOUNTER — Other Ambulatory Visit (INDEPENDENT_AMBULATORY_CARE_PROVIDER_SITE_OTHER): Payer: Self-pay | Admitting: *Deleted

## 2013-07-15 NOTE — Telephone Encounter (Signed)
Dr.Rehman the patient is requesting a prescription for Lialda be sent to his pharmacy, [place]

## 2013-07-15 NOTE — Telephone Encounter (Signed)
This has been addressed.

## 2013-07-18 ENCOUNTER — Encounter (INDEPENDENT_AMBULATORY_CARE_PROVIDER_SITE_OTHER): Payer: Self-pay | Admitting: Internal Medicine

## 2013-08-01 ENCOUNTER — Ambulatory Visit (INDEPENDENT_AMBULATORY_CARE_PROVIDER_SITE_OTHER): Payer: Medicare Other | Admitting: Internal Medicine

## 2013-08-01 ENCOUNTER — Encounter (INDEPENDENT_AMBULATORY_CARE_PROVIDER_SITE_OTHER): Payer: Self-pay | Admitting: Internal Medicine

## 2013-08-01 VITALS — BP 128/68 | HR 72 | Temp 96.9°F | Resp 18 | Ht 72.0 in | Wt 208.6 lb

## 2013-08-01 DIAGNOSIS — K51918 Ulcerative colitis, unspecified with other complication: Secondary | ICD-10-CM

## 2013-08-01 DIAGNOSIS — K5180 Other ulcerative colitis without complications: Secondary | ICD-10-CM | POA: Diagnosis not present

## 2013-08-01 NOTE — Patient Instructions (Addendum)
Notify if diarrhea or rectal bleeding recurs. Followup with Dr. Gar Ponto regarding heart murmur.

## 2013-08-01 NOTE — Progress Notes (Signed)
Presenting complaint;  Followup for ulcerative colitis.  Subjective:  Patient is 79 year old Caucasian male who presents for scheduled visit. Following his last visit on July 2014 he was begun on infliximab. He received first infusion on 06/24/2013 and second toes on 07/08/2013. He was receiving third dose later this week. He hasn't passed any blood per rectum in the last 4 weeks. He is having 2-3 bowel movements per day. Stools are semi-formed. He reports passing intact 3 out of on few occasions. He is not experiencing any side effects with infliximab. He denies abdominal pain nausea or vomiting. He is trying to walk some but continues to feel washed out and tired all the time. He denies shortness of breath or chest pain. He has no difficulty swallowing.  Current Medications: Current Outpatient Prescriptions  Medication Sig Dispense Refill  . ALPRAZolam (XANAX) 0.25 MG tablet TAKE ONE TABLET THREE TIMES DAILY AS NEEDED FOR ANXIETY  60 tablet  1  . LIALDA 1.2 G EC tablet TAKE TWO TABLETS TWICE DAILY  120 tablet  11  . metFORMIN (GLUCOPHAGE) 500 MG tablet Take 2,000 mg by mouth at bedtime.       . pantoprazole (PROTONIX) 40 MG tablet Take 1 tablet (40 mg total) by mouth daily.  30 tablet  11  . Probiotic Product (PROBIOTIC DAILY PO) Take by mouth daily.      . sodium chloride 0.9 % SOLN with inFLIXimab 100 MG SOLR Inject into the vein. Last infusion was 07/08/13.      . valsartan (DIOVAN) 80 MG tablet Take 80 mg by mouth every morning.        No current facility-administered medications for this visit.     Objective: Blood pressure 128/68, pulse 72, temperature 96.9 F (36.1 C), temperature source Oral, resp. rate 18, height 6' (1.829 m), weight 208 lb 9.6 oz (94.62 kg). Patient is alert and in no acute distress. Conjunctiva is pink. Sclera is nonicteric Oropharyngeal mucosa is normal. No neck masses or thyromegaly noted. Cardiac exam with regular rhythm normal S1 and S2. Faint systolic  ejection murmur noted at LLSB. It is more pronounced when he is sitting up. Lungs are clear to auscultation. Abdomen symmetrical soft and nontender without organomegaly or masses.  No LE edema or clubbing noted.   Assessment:  #1. Ulcerative colitis. Significant symptomatic improvement with infliximab. He will receive a third dose later this week and thereafter every 8 weeks. #2. Extreme fatigue. He does not appear to be in the neck and his last hemoglobin was 15.2 g. Serum B12 level was normal in January this year and TSH was also normal in December 2013. He does have short systolic murmur which I have not appreciated in the past. It is possibly a flow murmur but need to rule out aortic stenosis given persistence symptoms of fatigue and weakness. #3 erosive reflux esophagitis with esophageal stricture. Doing well since last EGD/ED 6 weeks ago.   Plan:  Notify if he passes intact Lialda pills. Continue with infliximab infusion as planned. He will finished induction therapy with next dose and then go on maintenance. Office visit with Dr. Gar Ponto as planned and he will determine whether or not he needs further workup regarding systolic murmur. Office visit in 4 months.

## 2013-08-05 ENCOUNTER — Encounter (HOSPITAL_COMMUNITY)
Admission: RE | Admit: 2013-08-05 | Discharge: 2013-08-05 | Disposition: A | Discharge status: Home / Self Care | Payer: Medicare Other | Source: Ambulatory Visit | Attending: Internal Medicine | Admitting: Internal Medicine

## 2013-08-05 DIAGNOSIS — K5190 Ulcerative colitis, unspecified, without complications: Secondary | ICD-10-CM | POA: Insufficient documentation

## 2013-08-05 MED ORDER — SODIUM CHLORIDE 0.9 % IV SOLN
500.0000 mg | INTRAVENOUS | Status: DC
  Administered 2013-08-05: 500 mg via INTRAVENOUS
  Filled 2013-08-05: qty 50

## 2013-08-05 MED ORDER — SODIUM CHLORIDE 0.9 % IV SOLN
INTRAVENOUS | Status: DC
  Administered 2013-08-05: 10:00:00 via INTRAVENOUS

## 2013-08-07 ENCOUNTER — Other Ambulatory Visit (INDEPENDENT_AMBULATORY_CARE_PROVIDER_SITE_OTHER): Payer: Self-pay | Admitting: Internal Medicine

## 2013-08-07 DIAGNOSIS — K5190 Ulcerative colitis, unspecified, without complications: Secondary | ICD-10-CM

## 2013-08-07 MED ORDER — MESALAMINE ER 0.375 G PO CP24
1500.0000 mg | ORAL_CAPSULE | Freq: Every day | ORAL | Status: DC
Start: 2013-08-07 — End: 2014-08-10

## 2013-08-07 NOTE — Telephone Encounter (Signed)
Patient states he is passing intact Lialda  Pills. Therefore we'll switch him to apriso.

## 2013-08-08 DIAGNOSIS — M722 Plantar fascial fibromatosis: Secondary | ICD-10-CM | POA: Diagnosis not present

## 2013-08-08 DIAGNOSIS — E119 Type 2 diabetes mellitus without complications: Secondary | ICD-10-CM | POA: Diagnosis not present

## 2013-08-08 DIAGNOSIS — E1149 Type 2 diabetes mellitus with other diabetic neurological complication: Secondary | ICD-10-CM | POA: Diagnosis not present

## 2013-08-08 DIAGNOSIS — M79609 Pain in unspecified limb: Secondary | ICD-10-CM | POA: Diagnosis not present

## 2013-08-20 DIAGNOSIS — R5381 Other malaise: Secondary | ICD-10-CM | POA: Diagnosis not present

## 2013-08-20 DIAGNOSIS — R011 Cardiac murmur, unspecified: Secondary | ICD-10-CM | POA: Diagnosis not present

## 2013-08-20 DIAGNOSIS — Z23 Encounter for immunization: Secondary | ICD-10-CM | POA: Diagnosis not present

## 2013-08-25 DIAGNOSIS — I2699 Other pulmonary embolism without acute cor pulmonale: Secondary | ICD-10-CM | POA: Diagnosis not present

## 2013-08-25 DIAGNOSIS — R011 Cardiac murmur, unspecified: Secondary | ICD-10-CM | POA: Diagnosis not present

## 2013-08-25 DIAGNOSIS — I359 Nonrheumatic aortic valve disorder, unspecified: Secondary | ICD-10-CM | POA: Diagnosis not present

## 2013-08-25 DIAGNOSIS — J309 Allergic rhinitis, unspecified: Secondary | ICD-10-CM | POA: Diagnosis not present

## 2013-08-25 DIAGNOSIS — G4733 Obstructive sleep apnea (adult) (pediatric): Secondary | ICD-10-CM | POA: Diagnosis not present

## 2013-09-05 DIAGNOSIS — M79609 Pain in unspecified limb: Secondary | ICD-10-CM | POA: Diagnosis not present

## 2013-09-05 DIAGNOSIS — M722 Plantar fascial fibromatosis: Secondary | ICD-10-CM | POA: Diagnosis not present

## 2013-09-15 ENCOUNTER — Other Ambulatory Visit: Payer: Self-pay

## 2013-09-16 DIAGNOSIS — E782 Mixed hyperlipidemia: Secondary | ICD-10-CM | POA: Diagnosis not present

## 2013-09-16 DIAGNOSIS — E119 Type 2 diabetes mellitus without complications: Secondary | ICD-10-CM | POA: Diagnosis not present

## 2013-09-16 DIAGNOSIS — G4733 Obstructive sleep apnea (adult) (pediatric): Secondary | ICD-10-CM | POA: Diagnosis not present

## 2013-09-16 DIAGNOSIS — K5100 Ulcerative (chronic) pancolitis without complications: Secondary | ICD-10-CM | POA: Diagnosis not present

## 2013-09-16 DIAGNOSIS — I10 Essential (primary) hypertension: Secondary | ICD-10-CM | POA: Diagnosis not present

## 2013-09-16 DIAGNOSIS — K645 Perianal venous thrombosis: Secondary | ICD-10-CM | POA: Diagnosis not present

## 2013-09-16 DIAGNOSIS — Z1331 Encounter for screening for depression: Secondary | ICD-10-CM | POA: Diagnosis not present

## 2013-09-26 DIAGNOSIS — M722 Plantar fascial fibromatosis: Secondary | ICD-10-CM | POA: Diagnosis not present

## 2013-09-26 DIAGNOSIS — M79609 Pain in unspecified limb: Secondary | ICD-10-CM | POA: Diagnosis not present

## 2013-09-30 ENCOUNTER — Encounter (HOSPITAL_COMMUNITY)
Admission: RE | Admit: 2013-09-30 | Discharge: 2013-09-30 | Disposition: A | Discharge status: Home / Self Care | Payer: Medicare Other | Source: Ambulatory Visit | Attending: Internal Medicine | Admitting: Internal Medicine

## 2013-09-30 DIAGNOSIS — K5190 Ulcerative colitis, unspecified, without complications: Secondary | ICD-10-CM | POA: Insufficient documentation

## 2013-09-30 MED ORDER — SODIUM CHLORIDE 0.9 % IV SOLN
5.0000 mg/kg | Freq: Once | INTRAVENOUS | Status: AC
  Administered 2013-09-30: 500 mg via INTRAVENOUS
  Filled 2013-09-30: qty 50

## 2013-09-30 MED ORDER — LORATADINE 10 MG PO TABS
ORAL_TABLET | ORAL | Status: AC
  Filled 2013-09-30: qty 1

## 2013-09-30 MED ORDER — LORATADINE 10 MG PO TABS
10.0000 mg | ORAL_TABLET | Freq: Once | ORAL | Status: AC
  Administered 2013-09-30: 10 mg via ORAL

## 2013-09-30 MED ORDER — ACETAMINOPHEN 325 MG PO TABS
ORAL_TABLET | ORAL | Status: AC
  Filled 2013-09-30: qty 2

## 2013-09-30 MED ORDER — ACETAMINOPHEN 325 MG PO TABS
650.0000 mg | ORAL_TABLET | Freq: Once | ORAL | Status: AC
  Administered 2013-09-30: 650 mg via ORAL

## 2013-09-30 NOTE — Progress Notes (Signed)
Here for remicade infusion. Dx ulcerative colitis.

## 2013-09-30 NOTE — Progress Notes (Signed)
Lunch tray given. Tolerated well.

## 2013-09-30 NOTE — Progress Notes (Signed)
remicade complete. No reaction present. Voices no c/o at this time. D/C to home in good condition.

## 2013-10-17 DIAGNOSIS — E1149 Type 2 diabetes mellitus with other diabetic neurological complication: Secondary | ICD-10-CM | POA: Diagnosis not present

## 2013-10-17 DIAGNOSIS — E119 Type 2 diabetes mellitus without complications: Secondary | ICD-10-CM | POA: Diagnosis not present

## 2013-10-21 ENCOUNTER — Telehealth (INDEPENDENT_AMBULATORY_CARE_PROVIDER_SITE_OTHER): Payer: Self-pay | Admitting: *Deleted

## 2013-10-21 ENCOUNTER — Encounter (INDEPENDENT_AMBULATORY_CARE_PROVIDER_SITE_OTHER): Payer: Self-pay | Admitting: Internal Medicine

## 2013-10-21 NOTE — Telephone Encounter (Signed)
Patient had sent an message to me earlier this morning ,asking if Dr.Rehman did hemorrhoids. I had told the patient that I would call him shortly. I called his home number and his cell number,leaving a message on both. I ask the patient to call me as I would like to know exactly what was going on , what has he used,ect. I suggested that he see his primary physician.

## 2013-10-21 NOTE — Telephone Encounter (Signed)
Patient returned call. He states that he is not having any bleeding, itching,pain. He went to doctor 6 weeks ago and was given Proctosolone HC 2.5% cream and he has used this as directed. He says he can't tell any difference in the size, which he says that the 2 places are the size of green peas. Patient was told that I would make doctor Rehman aware, and we would contact him next week.

## 2013-10-24 NOTE — Telephone Encounter (Signed)
Patient called and I spoke wiht his wife.  Dr. Olevia Perches recommendation given, wife says that they will wait until his office visit ,12/05/13. If any problems occur patient will call.

## 2013-10-24 NOTE — Telephone Encounter (Signed)
He possibly has skin tags. If these bother him he will need to be referred to a surgeon for banding otherwise I can check these at the time of next office visit. He does have hemorrhoids as noted on his last colonoscopy.

## 2013-10-27 DIAGNOSIS — Z85820 Personal history of malignant melanoma of skin: Secondary | ICD-10-CM | POA: Diagnosis not present

## 2013-10-27 DIAGNOSIS — D235 Other benign neoplasm of skin of trunk: Secondary | ICD-10-CM | POA: Diagnosis not present

## 2013-10-27 DIAGNOSIS — L570 Actinic keratosis: Secondary | ICD-10-CM | POA: Diagnosis not present

## 2013-11-25 ENCOUNTER — Encounter (HOSPITAL_COMMUNITY)
Admission: RE | Admit: 2013-11-25 | Discharge: 2013-11-25 | Disposition: A | Discharge status: Home / Self Care | Payer: Medicare Other | Source: Ambulatory Visit | Attending: Internal Medicine | Admitting: Internal Medicine

## 2013-11-25 DIAGNOSIS — K5190 Ulcerative colitis, unspecified, without complications: Secondary | ICD-10-CM | POA: Insufficient documentation

## 2013-11-25 DIAGNOSIS — M79609 Pain in unspecified limb: Secondary | ICD-10-CM | POA: Diagnosis not present

## 2013-11-25 DIAGNOSIS — M19079 Primary osteoarthritis, unspecified ankle and foot: Secondary | ICD-10-CM | POA: Diagnosis not present

## 2013-11-25 MED ORDER — DIPHENHYDRAMINE HCL 50 MG/ML IJ SOLN
50.0000 mg | Freq: Once | INTRAMUSCULAR | Status: AC
  Administered 2013-11-25: 50 mg via INTRAVENOUS

## 2013-11-25 MED ORDER — SODIUM CHLORIDE 0.9 % IV SOLN
INTRAVENOUS | Status: DC
  Administered 2013-11-25: 09:00:00 via INTRAVENOUS

## 2013-11-25 MED ORDER — SODIUM CHLORIDE 0.9 % IV SOLN
500.0000 mg | INTRAVENOUS | Status: DC
  Administered 2013-11-25: 500 mg via INTRAVENOUS
  Filled 2013-11-25: qty 50

## 2013-11-25 MED ORDER — LORATADINE 10 MG PO TABS
10.0000 mg | ORAL_TABLET | Freq: Every day | ORAL | Status: DC
  Administered 2013-11-25: 10 mg via ORAL

## 2013-11-25 MED ORDER — DIPHENHYDRAMINE HCL 50 MG/ML IJ SOLN
INTRAMUSCULAR | Status: AC
  Filled 2013-11-25: qty 1

## 2013-11-25 MED ORDER — METHYLPREDNISOLONE SODIUM SUCC 125 MG IJ SOLR
INTRAMUSCULAR | Status: AC
  Filled 2013-11-25: qty 2

## 2013-11-25 MED ORDER — LORATADINE 10 MG PO TABS
ORAL_TABLET | ORAL | Status: AC
  Filled 2013-11-25: qty 1

## 2013-11-25 MED ORDER — METHYLPREDNISOLONE SODIUM SUCC 125 MG IJ SOLR
125.0000 mg | Freq: Once | INTRAMUSCULAR | Status: AC
  Administered 2013-11-25: 125 mg via INTRAVENOUS

## 2013-11-25 NOTE — Progress Notes (Signed)
Patient feeling much better after Benadryl and SoluMedrol. No redness, hot, tingly or heavy feeling. Dr. Laural Golden wants Remicade to restart at 75cc/hr and not to increase to max rate.

## 2013-11-25 NOTE — Discharge Instructions (Signed)
Infliximab injection What is this medicine? INFLIXIMAB (in [place] i mab) is used to treat Crohn's disease and ulcerative colitis. It is also used to treat ankylosing spondylitis, psoriasis, and some forms of arthritis. This medicine may be used for other purposes; ask your health care provider or pharmacist if you have questions. COMMON BRAND NAME(S): Remicade What should I tell my health care provider before I take this medicine? They need to know if you have any of these conditions: -diabetes -exposure to tuberculosis -heart failure -hepatitis or liver disease -immune system problems -infection -lung or breathing disease, like COPD -multiple sclerosis -current or past resident of [place] or [place] -seizure disorder -an unusual or allergic reaction to infliximab, mouse proteins, other medicines, foods, dyes, or preservatives -pregnant or trying to get pregnant -breast-feeding How should I use this medicine? This medicine is for injection into a vein. It is usually given by a health care professional in a hospital or clinic setting. A special MedGuide will be given to you by the pharmacist with each prescription and refill. Be sure to read this information carefully each time. Talk to your pediatrician regarding the use of this medicine in children. Special care may be needed. Overdosage: If you think you have taken too much of this medicine contact a poison control center or emergency room at once. NOTE: This medicine is only for you. Do not share this medicine with others. What if I miss a dose? It is important not to miss your dose. Call your doctor or health care professional if you are unable to keep an appointment. What may interact with this medicine? Do not take this medicine with any of the following medications: -anakinra -rilonacept This medicine may also interact with the following medications: -vaccines This list may not describe all possible interactions.  Give your health care provider a list of all the medicines, herbs, non-prescription drugs, or dietary supplements you use. Also tell them if you smoke, drink alcohol, or use illegal drugs. Some items may interact with your medicine. What should I watch for while using this medicine? Visit your doctor or health care professional for regular checks on your progress. If you get a cold or other infection while receiving this medicine, call your doctor or health care professional. Do not treat yourself. This medicine may decrease your body's ability to fight infections. Before beginning therapy, your doctor may do a test to see if you have been exposed to tuberculosis. This medicine may make the symptoms of heart failure worse in some patients. If you notice symptoms such as increased shortness of breath or swelling of the ankles or legs, contact your health care provider right away. If you are going to have surgery or dental work, tell your health care professional or dentist that you have received this medicine. If you take this medicine for plaque psoriasis, stay out of the sun. If you cannot avoid being in the sun, wear protective clothing and use sunscreen. Do not use sun lamps or tanning beds/booths. What side effects may I notice from receiving this medicine? Side effects that you should report to your doctor or health care professional as soon as possible: -allergic reactions like skin rash, itching or hives, swelling of the face, lips, or tongue -chest pain -fever or chills, usually related to the infusion -muscle or joint pain -red, scaly patches or raised bumps on the skin -signs of infection - fever or chills, cough, sore throat, pain or difficulty passing urine -swollen lymph nodes  in the neck, underarm, or groin areas -unexplained weight loss -unusual bleeding or bruising -unusually weak or tired -yellowing of the eyes or skin Side effects that usually do not require medical attention  (report to your doctor or health care professional if they continue or are bothersome): -headache -heartburn or stomach pain -nausea, vomiting This list may not describe all possible side effects. Call your doctor for medical advice about side effects. You may report side effects to FDA at 1-800-FDA-1088. Where should I keep my medicine? This drug is given in a hospital or clinic and will not be stored at home. NOTE: This sheet is a summary. It may not cover all possible information. If you have questions about this medicine, talk to your doctor, pharmacist, or health care provider.  2014, Elsevier/Gold Standard. (2008-06-14 10:26:02)

## 2013-11-25 NOTE — Progress Notes (Signed)
Tolerated infusion well after medicated. Dr Laural Golden in check on pts status. Discharged without further complaints

## 2013-11-25 NOTE — Progress Notes (Signed)
Patient complained of feeling "hot and heavy" face flushed and redness noted neck chest and back. Denies shortness of breath but lips tingle. Infusion stopped and Dr. Laural Golden paged.

## 2013-12-05 ENCOUNTER — Ambulatory Visit (INDEPENDENT_AMBULATORY_CARE_PROVIDER_SITE_OTHER): Payer: Medicare Other | Admitting: Internal Medicine

## 2013-12-05 ENCOUNTER — Encounter (INDEPENDENT_AMBULATORY_CARE_PROVIDER_SITE_OTHER): Payer: Self-pay | Admitting: Internal Medicine

## 2013-12-05 VITALS — BP 130/76 | HR 78 | Temp 95.6°F | Resp 18 | Ht 72.0 in | Wt 206.8 lb

## 2013-12-05 DIAGNOSIS — K5190 Ulcerative colitis, unspecified, without complications: Secondary | ICD-10-CM

## 2013-12-05 DIAGNOSIS — K219 Gastro-esophageal reflux disease without esophagitis: Secondary | ICD-10-CM | POA: Diagnosis not present

## 2013-12-05 DIAGNOSIS — M722 Plantar fascial fibromatosis: Secondary | ICD-10-CM | POA: Diagnosis not present

## 2013-12-05 DIAGNOSIS — I10 Essential (primary) hypertension: Secondary | ICD-10-CM | POA: Diagnosis not present

## 2013-12-05 MED ORDER — PANTOPRAZOLE SODIUM 40 MG PO TBEC: 40.0000 mg | DELAYED_RELEASE_TABLET | ORAL | Status: DC

## 2013-12-05 NOTE — Progress Notes (Signed)
Presenting complaint;  Followup for ulcerative colitis.  Subjective:  Patient is 80 year old Caucasian male with history of ulcerative colitis who presents for scheduled visit. He is on Apriso and infliximab. Infliximab was begun on 06/24/2013 and last dose was on 11/26/2013. With his last dose he developed itching over his scalp which was followed by generalized itching and flushing. He was given Solu-Medrol and drainage of infusion was slowed and he did fine. He feels quite well. He is having 1-2 soft stools daily. He has not seen any blood in his bowel movements for over 4 months. His appetite is good. He is walking at least 5 times a week and usually walks 1-2-1/2 miles each time. Heartburn is well controlled with therapy. He has dysphagia only if he does not chew his food well. He like to see if pantoprazole dose could be reduced.   Current Medications: Current Outpatient Prescriptions  Medication Sig Dispense Refill  . ALPRAZolam (XANAX) 0.25 MG tablet TAKE ONE TABLET THREE TIMES DAILY AS NEEDED FOR ANXIETY  60 tablet  1  . aspirin EC 81 MG tablet Take 81 mg by mouth daily.      Marland Kitchen losartan (COZAAR) 100 MG tablet Take 100 mg by mouth daily.      . mesalamine (APRISO) 0.375 G 24 hr capsule Take 4 capsules (1.5 g total) by mouth daily.  120 capsule  11  . metFORMIN (GLUCOPHAGE) 500 MG tablet Take 2,000 mg by mouth at bedtime.       . pantoprazole (PROTONIX) 40 MG tablet Take 1 tablet (40 mg total) by mouth daily.  30 tablet  11  . Probiotic Product (PROBIOTIC DAILY PO) Take by mouth daily.      . sodium chloride 0.9 % SOLN with inFLIXimab 100 MG SOLR Inject into the vein. Last infusion was 07/08/13.       No current facility-administered medications for this visit.     Objective: Blood pressure 130/76, pulse 78, temperature 95.6 F (35.3 C), temperature source Oral, resp. rate 18, height 6' (1.829 m), weight 206 lb 12.8 oz (93.804 kg). Patient is alert and in no acute  distress. Conjunctiva is pink. Sclera is nonicteric Oropharyngeal mucosa is normal. No neck masses or thyromegaly noted. Cardiac exam with regular rhythm normal S1 and S2. He has faint systolic ejection murmur best heard at the LLSB. Lungs are clear to auscultation. Abdomen is symmetrical soft and nontender without organomegaly or masses. No LE edema or clubbing noted.   Assessment:  #1. Ulcerative colitis. He remains in remission. Will consider stopping infliximab if he remains in remission for more than one year. 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 Flexible sigmoidoscopy in August 2014 was negative for CMV. #2. GERD complicated by esophageal stricture. Last EGD was in August 2014.  Plan:  Decrease pantoprazole to 40 mg by mouth every other day. Prednisone 10 mg by mouth along with Tylenol and Zyrtec before each infusion of infliximab. Office visit in 6 months.

## 2013-12-05 NOTE — Patient Instructions (Addendum)
Notify if you experience heartburn with pantoprazole every other day. Can take prednisone 10 mg along with Tylenol or Zyrtec prior to each infusion of Remicade.

## 2013-12-06 ENCOUNTER — Encounter (INDEPENDENT_AMBULATORY_CARE_PROVIDER_SITE_OTHER): Payer: Self-pay | Admitting: Internal Medicine

## 2013-12-26 DIAGNOSIS — E119 Type 2 diabetes mellitus without complications: Secondary | ICD-10-CM | POA: Diagnosis not present

## 2013-12-26 DIAGNOSIS — E1149 Type 2 diabetes mellitus with other diabetic neurological complication: Secondary | ICD-10-CM | POA: Diagnosis not present

## 2014-01-16 DIAGNOSIS — I10 Essential (primary) hypertension: Secondary | ICD-10-CM | POA: Diagnosis not present

## 2014-01-16 DIAGNOSIS — E119 Type 2 diabetes mellitus without complications: Secondary | ICD-10-CM | POA: Diagnosis not present

## 2014-01-16 DIAGNOSIS — E782 Mixed hyperlipidemia: Secondary | ICD-10-CM | POA: Diagnosis not present

## 2014-01-16 DIAGNOSIS — M1990 Unspecified osteoarthritis, unspecified site: Secondary | ICD-10-CM | POA: Diagnosis not present

## 2014-01-20 ENCOUNTER — Encounter (HOSPITAL_COMMUNITY)
Admission: RE | Admit: 2014-01-20 | Discharge: 2014-01-20 | Disposition: A | Discharge status: Home / Self Care | Payer: Medicare Other | Source: Ambulatory Visit | Attending: Internal Medicine | Admitting: Internal Medicine

## 2014-01-20 DIAGNOSIS — K5190 Ulcerative colitis, unspecified, without complications: Secondary | ICD-10-CM | POA: Insufficient documentation

## 2014-01-20 MED ORDER — SODIUM CHLORIDE 0.9 % IV SOLN
500.0000 mg | INTRAVENOUS | Status: DC
  Administered 2014-01-20: 500 mg via INTRAVENOUS
  Filled 2014-01-20: qty 50

## 2014-01-20 MED ORDER — SODIUM CHLORIDE 0.9 % IV SOLN
INTRAVENOUS | Status: DC
  Administered 2014-01-20: 08:00:00 via INTRAVENOUS

## 2014-01-23 DIAGNOSIS — G4733 Obstructive sleep apnea (adult) (pediatric): Secondary | ICD-10-CM | POA: Diagnosis not present

## 2014-01-23 DIAGNOSIS — K5100 Ulcerative (chronic) pancolitis without complications: Secondary | ICD-10-CM | POA: Diagnosis not present

## 2014-01-23 DIAGNOSIS — K645 Perianal venous thrombosis: Secondary | ICD-10-CM | POA: Diagnosis not present

## 2014-01-23 DIAGNOSIS — Z1331 Encounter for screening for depression: Secondary | ICD-10-CM | POA: Diagnosis not present

## 2014-01-23 DIAGNOSIS — E119 Type 2 diabetes mellitus without complications: Secondary | ICD-10-CM | POA: Diagnosis not present

## 2014-01-23 DIAGNOSIS — I10 Essential (primary) hypertension: Secondary | ICD-10-CM | POA: Diagnosis not present

## 2014-01-23 DIAGNOSIS — E782 Mixed hyperlipidemia: Secondary | ICD-10-CM | POA: Diagnosis not present

## 2014-03-06 DIAGNOSIS — E1149 Type 2 diabetes mellitus with other diabetic neurological complication: Secondary | ICD-10-CM | POA: Diagnosis not present

## 2014-03-06 DIAGNOSIS — E119 Type 2 diabetes mellitus without complications: Secondary | ICD-10-CM | POA: Diagnosis not present

## 2014-03-07 DIAGNOSIS — E119 Type 2 diabetes mellitus without complications: Secondary | ICD-10-CM | POA: Diagnosis not present

## 2014-03-16 DIAGNOSIS — M542 Cervicalgia: Secondary | ICD-10-CM | POA: Diagnosis not present

## 2014-03-16 DIAGNOSIS — R51 Headache: Secondary | ICD-10-CM | POA: Diagnosis not present

## 2014-03-17 ENCOUNTER — Encounter (HOSPITAL_COMMUNITY)
Admission: RE | Admit: 2014-03-17 | Discharge: 2014-03-17 | Disposition: A | Discharge status: Home / Self Care | Payer: Medicare Other | Source: Ambulatory Visit | Attending: Internal Medicine | Admitting: Internal Medicine

## 2014-03-17 ENCOUNTER — Encounter (HOSPITAL_COMMUNITY): Payer: Self-pay

## 2014-03-17 DIAGNOSIS — Z79899 Other long term (current) drug therapy: Secondary | ICD-10-CM | POA: Insufficient documentation

## 2014-03-17 DIAGNOSIS — K5190 Ulcerative colitis, unspecified, without complications: Secondary | ICD-10-CM | POA: Diagnosis not present

## 2014-03-17 MED ORDER — SODIUM CHLORIDE 0.9 % IV SOLN
500.0000 mg | Freq: Once | INTRAVENOUS | Status: AC
  Administered 2014-03-17: 500 mg via INTRAVENOUS
  Filled 2014-03-17: qty 50

## 2014-03-17 MED ORDER — ACETAMINOPHEN 325 MG PO TABS
650.0000 mg | ORAL_TABLET | Freq: Once | ORAL | Status: DC
  Filled 2014-03-17: qty 2

## 2014-03-17 MED ORDER — LORATADINE 10 MG PO TABS
10.0000 mg | ORAL_TABLET | Freq: Once | ORAL | Status: DC
  Filled 2014-03-17: qty 1

## 2014-03-17 MED ORDER — DIPHENHYDRAMINE HCL 25 MG PO CAPS
50.0000 mg | ORAL_CAPSULE | Freq: Once | ORAL | Status: DC
  Filled 2014-03-17: qty 2

## 2014-03-21 DIAGNOSIS — R51 Headache: Secondary | ICD-10-CM | POA: Diagnosis not present

## 2014-03-21 DIAGNOSIS — M542 Cervicalgia: Secondary | ICD-10-CM | POA: Diagnosis not present

## 2014-03-22 DIAGNOSIS — R51 Headache: Secondary | ICD-10-CM | POA: Diagnosis not present

## 2014-03-22 DIAGNOSIS — M542 Cervicalgia: Secondary | ICD-10-CM | POA: Diagnosis not present

## 2014-03-24 DIAGNOSIS — M542 Cervicalgia: Secondary | ICD-10-CM | POA: Diagnosis not present

## 2014-03-24 DIAGNOSIS — R51 Headache: Secondary | ICD-10-CM | POA: Diagnosis not present

## 2014-03-27 DIAGNOSIS — R51 Headache: Secondary | ICD-10-CM | POA: Diagnosis not present

## 2014-03-27 DIAGNOSIS — M542 Cervicalgia: Secondary | ICD-10-CM | POA: Diagnosis not present

## 2014-03-29 DIAGNOSIS — M542 Cervicalgia: Secondary | ICD-10-CM | POA: Diagnosis not present

## 2014-03-29 DIAGNOSIS — R51 Headache: Secondary | ICD-10-CM | POA: Diagnosis not present

## 2014-03-30 DIAGNOSIS — R51 Headache: Secondary | ICD-10-CM | POA: Diagnosis not present

## 2014-03-30 DIAGNOSIS — M542 Cervicalgia: Secondary | ICD-10-CM | POA: Diagnosis not present

## 2014-04-04 DIAGNOSIS — R51 Headache: Secondary | ICD-10-CM | POA: Diagnosis not present

## 2014-04-04 DIAGNOSIS — M542 Cervicalgia: Secondary | ICD-10-CM | POA: Diagnosis not present

## 2014-04-05 DIAGNOSIS — M542 Cervicalgia: Secondary | ICD-10-CM | POA: Diagnosis not present

## 2014-04-05 DIAGNOSIS — R51 Headache: Secondary | ICD-10-CM | POA: Diagnosis not present

## 2014-04-06 DIAGNOSIS — M542 Cervicalgia: Secondary | ICD-10-CM | POA: Diagnosis not present

## 2014-04-06 DIAGNOSIS — R51 Headache: Secondary | ICD-10-CM | POA: Diagnosis not present

## 2014-04-10 DIAGNOSIS — M542 Cervicalgia: Secondary | ICD-10-CM | POA: Diagnosis not present

## 2014-04-10 DIAGNOSIS — R51 Headache: Secondary | ICD-10-CM | POA: Diagnosis not present

## 2014-04-12 DIAGNOSIS — M542 Cervicalgia: Secondary | ICD-10-CM | POA: Diagnosis not present

## 2014-04-12 DIAGNOSIS — R51 Headache: Secondary | ICD-10-CM | POA: Diagnosis not present

## 2014-05-08 DIAGNOSIS — L570 Actinic keratosis: Secondary | ICD-10-CM | POA: Diagnosis not present

## 2014-05-08 DIAGNOSIS — Z85820 Personal history of malignant melanoma of skin: Secondary | ICD-10-CM | POA: Diagnosis not present

## 2014-05-08 DIAGNOSIS — T148XXA Other injury of unspecified body region, initial encounter: Secondary | ICD-10-CM | POA: Diagnosis not present

## 2014-05-15 ENCOUNTER — Encounter (HOSPITAL_COMMUNITY)
Admission: RE | Admit: 2014-05-15 | Discharge: 2014-05-15 | Disposition: A | Discharge status: Home / Self Care | Payer: Medicare Other | Source: Ambulatory Visit | Attending: Internal Medicine | Admitting: Internal Medicine

## 2014-05-15 DIAGNOSIS — K5190 Ulcerative colitis, unspecified, without complications: Secondary | ICD-10-CM | POA: Insufficient documentation

## 2014-05-15 MED ORDER — SODIUM CHLORIDE 0.9 % IV SOLN
INTRAVENOUS | Status: DC
  Administered 2014-05-15: 200 mL via INTRAVENOUS

## 2014-05-15 MED ORDER — SODIUM CHLORIDE 0.9 % IV SOLN
500.0000 mg | INTRAVENOUS | Status: DC
  Administered 2014-05-15: 500 mg via INTRAVENOUS
  Filled 2014-05-15: qty 50

## 2014-05-15 NOTE — Progress Notes (Signed)
Arrived for scheduled dose Remicade. States he took ordered doses tylenol 650, claritin 10, and benedryl 50 at 0700 this am at home.

## 2014-05-25 DIAGNOSIS — IMO0001 Reserved for inherently not codable concepts without codable children: Secondary | ICD-10-CM | POA: Diagnosis not present

## 2014-05-25 DIAGNOSIS — R5381 Other malaise: Secondary | ICD-10-CM | POA: Diagnosis not present

## 2014-05-25 DIAGNOSIS — R5383 Other fatigue: Secondary | ICD-10-CM | POA: Diagnosis not present

## 2014-05-25 DIAGNOSIS — E782 Mixed hyperlipidemia: Secondary | ICD-10-CM | POA: Diagnosis not present

## 2014-05-25 DIAGNOSIS — I10 Essential (primary) hypertension: Secondary | ICD-10-CM | POA: Diagnosis not present

## 2014-05-29 DIAGNOSIS — E119 Type 2 diabetes mellitus without complications: Secondary | ICD-10-CM | POA: Diagnosis not present

## 2014-05-29 DIAGNOSIS — E1149 Type 2 diabetes mellitus with other diabetic neurological complication: Secondary | ICD-10-CM | POA: Diagnosis not present

## 2014-06-12 ENCOUNTER — Ambulatory Visit (INDEPENDENT_AMBULATORY_CARE_PROVIDER_SITE_OTHER): Payer: Medicare Other | Admitting: Internal Medicine

## 2014-06-12 ENCOUNTER — Encounter (INDEPENDENT_AMBULATORY_CARE_PROVIDER_SITE_OTHER): Payer: Self-pay | Admitting: Internal Medicine

## 2014-06-12 VITALS — BP 128/76 | HR 82 | Temp 97.6°F | Resp 18 | Ht 72.0 in | Wt 207.4 lb

## 2014-06-12 DIAGNOSIS — K3189 Other diseases of stomach and duodenum: Secondary | ICD-10-CM | POA: Diagnosis not present

## 2014-06-12 DIAGNOSIS — K5190 Ulcerative colitis, unspecified, without complications: Secondary | ICD-10-CM | POA: Diagnosis not present

## 2014-06-12 DIAGNOSIS — R1013 Epigastric pain: Secondary | ICD-10-CM | POA: Diagnosis not present

## 2014-06-12 DIAGNOSIS — K51918 Ulcerative colitis, unspecified with other complication: Secondary | ICD-10-CM

## 2014-06-12 NOTE — Patient Instructions (Addendum)
Call if you experience any side effects with Remicade or if you have diarrhea or rectal bleeding. Please check with Dr. Quillian Quince if metformin dose could be reduced as it might be causing your GI symptoms

## 2014-06-12 NOTE — Progress Notes (Signed)
Presenting complaint;  Followup for ulcerative colitis.  Subjective:  Patient is 80 year old Caucasian male who has history of ulcerative colitis and presents for scheduled visit. He was last in January of this year. He remains on Remicade infliximab for maintenance along with Apriso. He is having one to 2 formed stools daily. He does not remember the last time he passed blood per rectum. He states he is doing much better but he still has not recovered fully. He has no stamina but stays busy all day. Today he is complaining of postprandial "unsettled stomach" or mild generalized abdominal discomfort. This symptom occurs after most meals and may last for couple of hours. He has had this symptom for more than 2 months. He may have slight nausea but denies vomiting. He does not feel bloated. His appetite is good and his weight has been stable. He is having no side effects with Remicade.       Current Medications: Outpatient Encounter Prescriptions as of 06/12/2014  Medication Sig  . ALPRAZolam (XANAX) 0.25 MG tablet TAKE ONE TABLET THREE TIMES DAILY AS NEEDED FOR ANXIETY  . aspirin EC 81 MG tablet Take 81 mg by mouth daily.  Marland Kitchen losartan (COZAAR) 100 MG tablet Take 100 mg by mouth daily.  . mesalamine (APRISO) 0.375 G 24 hr capsule Take 4 capsules (1.5 g total) by mouth daily.  . metFORMIN (GLUCOPHAGE) 500 MG tablet Take 2,000 mg by mouth at bedtime.   . Multiple Vitamins-Minerals (CENTRUM SILVER PO) Take by mouth daily.  . pantoprazole (PROTONIX) 40 MG tablet Take 1 tablet (40 mg total) by mouth every other day.  . Probiotic Product (PROBIOTIC DAILY PO) Take by mouth daily.  . sodium chloride 0.9 % SOLN with inFLIXimab 100 MG SOLR Inject into the vein. Last infusion was 07/08/13.     Objective: Blood pressure 128/76, pulse 82, temperature 97.6 F (36.4 C), temperature source Oral, resp. rate 18, height 6' (1.829 m), weight 207 lb 6.4 oz (94.076 kg). Patient is alert and in no acute  distress. Conjunctiva is pink. Sclera is nonicteric Oropharyngeal mucosa is normal. No neck masses or thyromegaly noted. Cardiac exam with regular rhythm normal S1 and S2. No murmur or gallop noted. Lungs are clear to auscultation. Abdomen is full. Bowel sounds are normal. On palpation abdomen is soft and nontender without organomegaly or masses.  No LE edema or clubbing noted.  Labs/studies Results:   H. pylori serology in August 2014 was negative.  Assessment:  #1. Chronic ulcerative colitis. He has remained in remission for 18 months. He will continue Remicade for now. May consider dropping Remicade after another year and a half or earlier if he has any side effects or cost becomes an issue. #2. Dyspepsia possibly secondary to metformin.  Plan:  Request copy of recent blood work from Dr. Olena Heckle office. Check with Dr. Quillian Quince if metformin dose could be dropped. Continue Apriso at 1.5 g by mouth daily. Continue Remicade at infliximab for maintenance every 8 weeks. Office visit in 6 months.

## 2014-07-10 ENCOUNTER — Encounter (HOSPITAL_COMMUNITY)
Admission: RE | Admit: 2014-07-10 | Discharge: 2014-07-10 | Disposition: A | Discharge status: Home / Self Care | Payer: Medicare Other | Source: Ambulatory Visit | Attending: Internal Medicine | Admitting: Internal Medicine

## 2014-07-10 DIAGNOSIS — K5190 Ulcerative colitis, unspecified, without complications: Secondary | ICD-10-CM | POA: Diagnosis not present

## 2014-07-10 MED ORDER — SODIUM CHLORIDE 0.9 % IV SOLN
INTRAVENOUS | Status: DC
  Administered 2014-07-10: 200 mL via INTRAVENOUS

## 2014-07-10 MED ORDER — LORATADINE 10 MG PO TABS: 10.0000 mg | ORAL_TABLET | Freq: Every day | ORAL | Status: DC

## 2014-07-10 MED ORDER — DIPHENHYDRAMINE HCL 25 MG PO CAPS
50.0000 mg | ORAL_CAPSULE | Freq: Once | ORAL | Status: AC
  Administered 2014-07-10: 25 mg via ORAL
  Filled 2014-07-10: qty 2

## 2014-07-10 MED ORDER — ACETAMINOPHEN 325 MG PO TABS: 650.0000 mg | ORAL_TABLET | Freq: Once | ORAL | Status: DC

## 2014-07-10 MED ORDER — SODIUM CHLORIDE 0.9 % IV SOLN
500.0000 mg | INTRAVENOUS | Status: DC
  Administered 2014-07-10: 500 mg via INTRAVENOUS
  Filled 2014-07-10: qty 50

## 2014-07-10 NOTE — Progress Notes (Signed)
Pt states he took tylenol 650 , claritin 10, benedryl 25 and prednisone 10 at home per order Dr Laural Golden. Additional 25 mg benedryl given on arrival to Short Stay.

## 2014-07-21 DIAGNOSIS — E782 Mixed hyperlipidemia: Secondary | ICD-10-CM | POA: Diagnosis not present

## 2014-07-21 DIAGNOSIS — K5100 Ulcerative (chronic) pancolitis without complications: Secondary | ICD-10-CM | POA: Diagnosis not present

## 2014-07-21 DIAGNOSIS — G4733 Obstructive sleep apnea (adult) (pediatric): Secondary | ICD-10-CM | POA: Diagnosis not present

## 2014-07-21 DIAGNOSIS — E119 Type 2 diabetes mellitus without complications: Secondary | ICD-10-CM | POA: Diagnosis not present

## 2014-07-21 DIAGNOSIS — L570 Actinic keratosis: Secondary | ICD-10-CM | POA: Diagnosis not present

## 2014-07-21 DIAGNOSIS — Z1331 Encounter for screening for depression: Secondary | ICD-10-CM | POA: Diagnosis not present

## 2014-07-21 DIAGNOSIS — I10 Essential (primary) hypertension: Secondary | ICD-10-CM | POA: Diagnosis not present

## 2014-08-10 ENCOUNTER — Other Ambulatory Visit (INDEPENDENT_AMBULATORY_CARE_PROVIDER_SITE_OTHER): Payer: Self-pay | Admitting: Internal Medicine

## 2014-08-10 DIAGNOSIS — L110 Acquired keratosis follicularis: Secondary | ICD-10-CM | POA: Diagnosis not present

## 2014-08-10 DIAGNOSIS — E1140 Type 2 diabetes mellitus with diabetic neuropathy, unspecified: Secondary | ICD-10-CM | POA: Diagnosis not present

## 2014-08-10 DIAGNOSIS — B351 Tinea unguium: Secondary | ICD-10-CM | POA: Diagnosis not present

## 2014-08-10 DIAGNOSIS — E1142 Type 2 diabetes mellitus with diabetic polyneuropathy: Secondary | ICD-10-CM | POA: Diagnosis not present

## 2014-08-28 DIAGNOSIS — G43909 Migraine, unspecified, not intractable, without status migrainosus: Secondary | ICD-10-CM | POA: Diagnosis not present

## 2014-08-28 DIAGNOSIS — S161XXA Strain of muscle, fascia and tendon at neck level, initial encounter: Secondary | ICD-10-CM | POA: Diagnosis not present

## 2014-08-28 DIAGNOSIS — J0110 Acute frontal sinusitis, unspecified: Secondary | ICD-10-CM | POA: Diagnosis not present

## 2014-09-04 ENCOUNTER — Encounter (HOSPITAL_COMMUNITY)
Admission: RE | Admit: 2014-09-04 | Discharge: 2014-09-04 | Disposition: A | Discharge status: Home / Self Care | Payer: Medicare Other | Source: Ambulatory Visit | Attending: Internal Medicine | Admitting: Internal Medicine

## 2014-09-04 DIAGNOSIS — K5100 Ulcerative (chronic) pancolitis without complications: Secondary | ICD-10-CM | POA: Insufficient documentation

## 2014-09-04 MED ORDER — SODIUM CHLORIDE 0.9 % IV SOLN
500.0000 mg | INTRAVENOUS | Status: DC
  Administered 2014-09-04: 500 mg via INTRAVENOUS
  Filled 2014-09-04: qty 50

## 2014-09-04 MED ORDER — LORATADINE 10 MG PO TABS: 10.0000 mg | ORAL_TABLET | Freq: Once | ORAL | Status: DC

## 2014-09-04 MED ORDER — DIPHENHYDRAMINE HCL 25 MG PO CAPS: 50.0000 mg | ORAL_CAPSULE | Freq: Once | ORAL | Status: DC

## 2014-09-04 MED ORDER — SODIUM CHLORIDE 0.9 % IV SOLN
INTRAVENOUS | Status: DC
  Administered 2014-09-04: 250 mL via INTRAVENOUS

## 2014-09-04 MED ORDER — ACETAMINOPHEN 325 MG PO TABS: 650.0000 mg | ORAL_TABLET | Freq: Four times a day (QID) | ORAL | Status: DC | PRN

## 2014-09-04 NOTE — Progress Notes (Signed)
remicade completed at 1130. Pt refuses to stay for observation. States "I never stay and wait. I always go home." voices no c/o at this time. D/C to home in good condition.

## 2014-09-04 NOTE — Progress Notes (Signed)
IV rate increased to 20 ml/hr.

## 2014-09-04 NOTE — Progress Notes (Signed)
Here for remicade infusion. Aware of procedure. No questions. Consent signed. Dx ulcerative colitis.

## 2014-09-04 NOTE — Progress Notes (Signed)
Pt states took tylenol 650 mg, claritin 10 mg, and benadryl 50 mg, po at home before coming to hospital.

## 2014-09-20 ENCOUNTER — Telehealth (INDEPENDENT_AMBULATORY_CARE_PROVIDER_SITE_OTHER): Payer: Self-pay | Admitting: *Deleted

## 2014-09-21 NOTE — Telephone Encounter (Signed)
This chart was opened to review patient's medications.

## 2014-09-27 DIAGNOSIS — I10 Essential (primary) hypertension: Secondary | ICD-10-CM | POA: Diagnosis not present

## 2014-09-27 DIAGNOSIS — E1165 Type 2 diabetes mellitus with hyperglycemia: Secondary | ICD-10-CM | POA: Diagnosis not present

## 2014-09-27 DIAGNOSIS — E782 Mixed hyperlipidemia: Secondary | ICD-10-CM | POA: Diagnosis not present

## 2014-09-28 DIAGNOSIS — E1165 Type 2 diabetes mellitus with hyperglycemia: Secondary | ICD-10-CM | POA: Diagnosis not present

## 2014-09-28 DIAGNOSIS — Z23 Encounter for immunization: Secondary | ICD-10-CM | POA: Diagnosis not present

## 2014-10-03 DIAGNOSIS — Z9189 Other specified personal risk factors, not elsewhere classified: Secondary | ICD-10-CM | POA: Diagnosis not present

## 2014-10-03 DIAGNOSIS — I10 Essential (primary) hypertension: Secondary | ICD-10-CM | POA: Diagnosis not present

## 2014-10-03 DIAGNOSIS — G43909 Migraine, unspecified, not intractable, without status migrainosus: Secondary | ICD-10-CM | POA: Diagnosis not present

## 2014-10-03 DIAGNOSIS — E1165 Type 2 diabetes mellitus with hyperglycemia: Secondary | ICD-10-CM | POA: Diagnosis not present

## 2014-10-03 DIAGNOSIS — G4733 Obstructive sleep apnea (adult) (pediatric): Secondary | ICD-10-CM | POA: Diagnosis not present

## 2014-10-03 DIAGNOSIS — Z0001 Encounter for general adult medical examination with abnormal findings: Secondary | ICD-10-CM | POA: Diagnosis not present

## 2014-10-03 DIAGNOSIS — E782 Mixed hyperlipidemia: Secondary | ICD-10-CM | POA: Diagnosis not present

## 2014-10-03 DIAGNOSIS — R300 Dysuria: Secondary | ICD-10-CM | POA: Diagnosis not present

## 2014-10-19 DIAGNOSIS — B351 Tinea unguium: Secondary | ICD-10-CM | POA: Diagnosis not present

## 2014-10-19 DIAGNOSIS — L110 Acquired keratosis follicularis: Secondary | ICD-10-CM | POA: Diagnosis not present

## 2014-10-19 DIAGNOSIS — E1142 Type 2 diabetes mellitus with diabetic polyneuropathy: Secondary | ICD-10-CM | POA: Diagnosis not present

## 2014-10-25 DIAGNOSIS — L259 Unspecified contact dermatitis, unspecified cause: Secondary | ICD-10-CM | POA: Diagnosis not present

## 2014-10-25 DIAGNOSIS — I8310 Varicose veins of unspecified lower extremity with inflammation: Secondary | ICD-10-CM | POA: Diagnosis not present

## 2014-10-25 DIAGNOSIS — Z85820 Personal history of malignant melanoma of skin: Secondary | ICD-10-CM | POA: Diagnosis not present

## 2014-10-25 DIAGNOSIS — Z1283 Encounter for screening for malignant neoplasm of skin: Secondary | ICD-10-CM | POA: Diagnosis not present

## 2014-10-25 DIAGNOSIS — L308 Other specified dermatitis: Secondary | ICD-10-CM | POA: Diagnosis not present

## 2014-10-25 DIAGNOSIS — L570 Actinic keratosis: Secondary | ICD-10-CM | POA: Diagnosis not present

## 2014-10-25 DIAGNOSIS — Z08 Encounter for follow-up examination after completed treatment for malignant neoplasm: Secondary | ICD-10-CM | POA: Diagnosis not present

## 2014-10-25 DIAGNOSIS — X32XXXA Exposure to sunlight, initial encounter: Secondary | ICD-10-CM | POA: Diagnosis not present

## 2014-10-30 ENCOUNTER — Encounter (HOSPITAL_COMMUNITY)
Admission: RE | Admit: 2014-10-30 | Discharge: 2014-10-30 | Disposition: A | Discharge status: Home / Self Care | Payer: Medicare Other | Source: Ambulatory Visit | Attending: Internal Medicine | Admitting: Internal Medicine

## 2014-10-30 ENCOUNTER — Encounter (HOSPITAL_COMMUNITY): Payer: Self-pay

## 2014-10-30 VITALS — BP 151/79 | HR 65 | Resp 18

## 2014-10-30 DIAGNOSIS — K5190 Ulcerative colitis, unspecified, without complications: Secondary | ICD-10-CM | POA: Insufficient documentation

## 2014-10-30 DIAGNOSIS — B259 Cytomegaloviral disease, unspecified: Secondary | ICD-10-CM

## 2014-10-30 DIAGNOSIS — A0839 Other viral enteritis: Secondary | ICD-10-CM

## 2014-10-30 MED ORDER — LORATADINE 10 MG PO TABS: 10.0000 mg | ORAL_TABLET | Freq: Every day | ORAL | Status: DC

## 2014-10-30 MED ORDER — DIPHENHYDRAMINE HCL 25 MG PO CAPS: 50.0000 mg | ORAL_CAPSULE | Freq: Once | ORAL | Status: DC

## 2014-10-30 MED ORDER — SODIUM CHLORIDE 0.9 % IV SOLN
500.0000 mg | Freq: Once | INTRAVENOUS | Status: AC
  Administered 2014-10-30: 500 mg via INTRAVENOUS
  Filled 2014-10-30: qty 50

## 2014-10-30 MED ORDER — SODIUM CHLORIDE 0.9 % IV SOLN
INTRAVENOUS | Status: DC
  Administered 2014-10-30: 08:00:00 via INTRAVENOUS

## 2014-10-30 MED ORDER — ACETAMINOPHEN 325 MG PO TABS: 650.0000 mg | ORAL_TABLET | Freq: Once | ORAL | Status: DC

## 2014-11-06 ENCOUNTER — Encounter (INDEPENDENT_AMBULATORY_CARE_PROVIDER_SITE_OTHER): Payer: Self-pay | Admitting: Internal Medicine

## 2014-11-07 ENCOUNTER — Encounter (INDEPENDENT_AMBULATORY_CARE_PROVIDER_SITE_OTHER): Payer: Self-pay

## 2014-11-16 DIAGNOSIS — F5104 Psychophysiologic insomnia: Secondary | ICD-10-CM | POA: Diagnosis not present

## 2014-11-16 DIAGNOSIS — G4733 Obstructive sleep apnea (adult) (pediatric): Secondary | ICD-10-CM | POA: Diagnosis not present

## 2014-11-16 DIAGNOSIS — I10 Essential (primary) hypertension: Secondary | ICD-10-CM | POA: Diagnosis not present

## 2014-11-16 DIAGNOSIS — J309 Allergic rhinitis, unspecified: Secondary | ICD-10-CM | POA: Diagnosis not present

## 2014-11-21 ENCOUNTER — Other Ambulatory Visit (INDEPENDENT_AMBULATORY_CARE_PROVIDER_SITE_OTHER): Payer: Self-pay | Admitting: Internal Medicine

## 2014-11-23 DIAGNOSIS — I8310 Varicose veins of unspecified lower extremity with inflammation: Secondary | ICD-10-CM | POA: Diagnosis not present

## 2014-11-23 DIAGNOSIS — L03115 Cellulitis of right lower limb: Secondary | ICD-10-CM | POA: Diagnosis not present

## 2014-11-25 DIAGNOSIS — L309 Dermatitis, unspecified: Secondary | ICD-10-CM | POA: Diagnosis not present

## 2014-11-30 DIAGNOSIS — L03116 Cellulitis of left lower limb: Secondary | ICD-10-CM | POA: Diagnosis not present

## 2014-12-18 ENCOUNTER — Ambulatory Visit (INDEPENDENT_AMBULATORY_CARE_PROVIDER_SITE_OTHER): Payer: Medicare Other | Admitting: Internal Medicine

## 2014-12-18 ENCOUNTER — Encounter (INDEPENDENT_AMBULATORY_CARE_PROVIDER_SITE_OTHER): Payer: Self-pay | Admitting: Internal Medicine

## 2014-12-18 VITALS — BP 130/80 | HR 68 | Temp 97.1°F | Resp 18 | Ht 72.0 in | Wt 212.0 lb

## 2014-12-18 DIAGNOSIS — K2100 Gastro-esophageal reflux disease with esophagitis, without bleeding: Secondary | ICD-10-CM

## 2014-12-18 DIAGNOSIS — K5190 Ulcerative colitis, unspecified, without complications: Secondary | ICD-10-CM

## 2014-12-18 DIAGNOSIS — R748 Abnormal levels of other serum enzymes: Secondary | ICD-10-CM

## 2014-12-18 DIAGNOSIS — R740 Nonspecific elevation of levels of transaminase and lactic acid dehydrogenase [LDH]: Secondary | ICD-10-CM | POA: Diagnosis not present

## 2014-12-18 NOTE — Patient Instructions (Signed)
Will request copy of blood work from Dr. Olena Heckle office for review.

## 2014-12-18 NOTE — Progress Notes (Signed)
Presenting complaint;  Follow-up for UC and GERD.  Subjective:  Patient is 81 year old Caucasian male who is here for scheduled visit accompanied by his wife. He was last seen 6 months ago. He continues to feel well. He has 1-2 formed stools daily. He does not even remember the last time he had rectal bleeding. He stays busy and try to walk daily. His fatigue is not as pronounced as it used to be. At times he has balance problems but he denies falling episodes. Appetite is very good. He has gained 5 pounds since his last visit. He states he had lab studies by Dr. Gar Ponto in November 2015 and blood work was normal. He believes his H&H and transaminases were normal. He states Hawkins well controlled with therapy. He still has occasional dysphagia but nothing like he was having prior to esophageal dilation 18 months ago. Last dose of infliximab was on 10/30/2014. He is not experiencing any side effects. He wants to eventually come off this medication.   Current Medications: Outpatient Encounter Prescriptions as of 12/18/2014  Medication Sig  . ALPRAZolam (XANAX) 0.25 MG tablet TAKE ONE TABLET THREE TIMES DAILY AS NEEDED FOR ANXIETY  . aspirin EC 81 MG tablet Take 81 mg by mouth daily.  . clobetasol cream (TEMOVATE) 7.48 % 1 application 2 (two) times daily as needed.   Marland Kitchen LIALDA 1.2 G EC tablet 1.2 g. Patient takes 2 tablets twice a day  . losartan (COZAAR) 100 MG tablet Take 100 mg by mouth daily.  . metFORMIN (GLUCOPHAGE) 500 MG tablet Take 1,000 mg by mouth every evening.   . Multiple Vitamins-Minerals (CENTRUM SILVER PO) Take by mouth daily.  . pantoprazole (PROTONIX) 40 MG tablet TAKE ONE TABLET BY MOUTH EVERY *OTHER* DAY  . Probiotic Product (PROBIOTIC DAILY PO) Take by mouth daily.  . sodium chloride 0.9 % SOLN with inFLIXimab 100 MG SOLR Inject into the vein. Last infusion was 07/08/13.  . [DISCONTINUED] APRISO 0.375 G 24 hr capsule TAKE FOUR CAPSULES BY MOUTH DAILY (Patient not  taking: Reported on 12/18/2014)  . [DISCONTINUED] DULoxetine (CYMBALTA) 30 MG capsule Take 30 mg by mouth daily.     Objective: Blood pressure 130/80, pulse 68, temperature 97.1 F (36.2 C), temperature source Oral, resp. rate 18, height 6' (1.829 m), weight 212 lb (96.163 kg). The patient is alert and in no acute distress. Conjunctiva is pink. Sclera is nonicteric Oropharyngeal mucosa is normal. No neck masses or thyromegaly noted. Cardiac exam with regular rhythm normal S1 and S2. No murmur or gallop noted. Lungs are clear to auscultation. Abdomen is full but soft and nontender without organomegaly or masses. No LE edema or clubbing noted.  Labs/studies Results: AST and ALT were 47 and 50 respectively on 05/25/2014   recent lab studies requested from Dr. Olena Heckle office.  Assessment:  #1. Chronic ulcerative colitis. Disease duration more than 50 years. He was treated for CMV colitis over 2 years ago. He remains on remission with infliximab and oral mesalamine. #2. GERD. He has history of erosive reflux esophagitis complicated by stricture last dilated 18 months ago and he appears to be doing well. #3. History of mildly elevated transaminases.  Plan:  Will request copy of recent lab studies from Dr. Olena Heckle office. Will continue infliximab and oral mesalamine at current dose. May consider stopping infliximab next year unless he developed side effects in which case he will have to come off this medication earlier. Office visit in 6 months.

## 2014-12-20 ENCOUNTER — Encounter (INDEPENDENT_AMBULATORY_CARE_PROVIDER_SITE_OTHER): Payer: Self-pay

## 2014-12-27 DIAGNOSIS — E1142 Type 2 diabetes mellitus with diabetic polyneuropathy: Secondary | ICD-10-CM | POA: Diagnosis not present

## 2014-12-27 DIAGNOSIS — L110 Acquired keratosis follicularis: Secondary | ICD-10-CM | POA: Diagnosis not present

## 2014-12-27 DIAGNOSIS — B351 Tinea unguium: Secondary | ICD-10-CM | POA: Diagnosis not present

## 2015-01-01 ENCOUNTER — Inpatient Hospital Stay (HOSPITAL_COMMUNITY): Admission: RE | Admit: 2015-01-01 | Payer: Medicare Other | Source: Ambulatory Visit

## 2015-01-05 ENCOUNTER — Encounter (HOSPITAL_COMMUNITY)
Admission: RE | Admit: 2015-01-05 | Discharge: 2015-01-05 | Disposition: A | Discharge status: Home / Self Care | Payer: Medicare Other | Source: Ambulatory Visit | Attending: Internal Medicine | Admitting: Internal Medicine

## 2015-01-05 DIAGNOSIS — K5190 Ulcerative colitis, unspecified, without complications: Secondary | ICD-10-CM | POA: Insufficient documentation

## 2015-01-05 MED ORDER — SODIUM CHLORIDE 0.9 % IV SOLN
Freq: Once | INTRAVENOUS | Status: AC
  Administered 2015-01-05: 200 mL via INTRAVENOUS

## 2015-01-05 MED ORDER — SODIUM CHLORIDE 0.9 % IV SOLN
500.0000 mg | INTRAVENOUS | Status: DC
  Administered 2015-01-05: 500 mg via INTRAVENOUS
  Filled 2015-01-05: qty 50

## 2015-01-05 NOTE — Progress Notes (Signed)
Arrived for scheduled dose remicade. States he took preop meds at home this am--tylenol, zyrtec, benedryl, prednisone.

## 2015-01-16 DIAGNOSIS — D696 Thrombocytopenia, unspecified: Secondary | ICD-10-CM | POA: Diagnosis not present

## 2015-01-16 DIAGNOSIS — I10 Essential (primary) hypertension: Secondary | ICD-10-CM | POA: Diagnosis not present

## 2015-01-16 DIAGNOSIS — E1165 Type 2 diabetes mellitus with hyperglycemia: Secondary | ICD-10-CM | POA: Diagnosis not present

## 2015-01-16 DIAGNOSIS — E782 Mixed hyperlipidemia: Secondary | ICD-10-CM | POA: Diagnosis not present

## 2015-01-23 DIAGNOSIS — K5100 Ulcerative (chronic) pancolitis without complications: Secondary | ICD-10-CM | POA: Diagnosis not present

## 2015-01-23 DIAGNOSIS — G43909 Migraine, unspecified, not intractable, without status migrainosus: Secondary | ICD-10-CM | POA: Diagnosis not present

## 2015-01-23 DIAGNOSIS — E1165 Type 2 diabetes mellitus with hyperglycemia: Secondary | ICD-10-CM | POA: Diagnosis not present

## 2015-01-23 DIAGNOSIS — E782 Mixed hyperlipidemia: Secondary | ICD-10-CM | POA: Diagnosis not present

## 2015-01-23 DIAGNOSIS — M5481 Occipital neuralgia: Secondary | ICD-10-CM | POA: Diagnosis not present

## 2015-01-23 DIAGNOSIS — I10 Essential (primary) hypertension: Secondary | ICD-10-CM | POA: Diagnosis not present

## 2015-01-23 DIAGNOSIS — Z1389 Encounter for screening for other disorder: Secondary | ICD-10-CM | POA: Diagnosis not present

## 2015-01-23 DIAGNOSIS — G4733 Obstructive sleep apnea (adult) (pediatric): Secondary | ICD-10-CM | POA: Diagnosis not present

## 2015-01-23 DIAGNOSIS — Z9189 Other specified personal risk factors, not elsewhere classified: Secondary | ICD-10-CM | POA: Diagnosis not present

## 2015-01-30 DIAGNOSIS — G43909 Migraine, unspecified, not intractable, without status migrainosus: Secondary | ICD-10-CM | POA: Diagnosis not present

## 2015-01-31 ENCOUNTER — Encounter (INDEPENDENT_AMBULATORY_CARE_PROVIDER_SITE_OTHER): Payer: Self-pay

## 2015-02-06 DIAGNOSIS — I998 Other disorder of circulatory system: Secondary | ICD-10-CM | POA: Diagnosis not present

## 2015-02-06 DIAGNOSIS — G43909 Migraine, unspecified, not intractable, without status migrainosus: Secondary | ICD-10-CM | POA: Diagnosis not present

## 2015-02-06 DIAGNOSIS — Z8781 Personal history of (healed) traumatic fracture: Secondary | ICD-10-CM | POA: Diagnosis not present

## 2015-02-06 DIAGNOSIS — I669 Occlusion and stenosis of unspecified cerebral artery: Secondary | ICD-10-CM | POA: Diagnosis not present

## 2015-02-19 DIAGNOSIS — G44229 Chronic tension-type headache, not intractable: Secondary | ICD-10-CM | POA: Diagnosis not present

## 2015-02-19 DIAGNOSIS — R51 Headache: Secondary | ICD-10-CM | POA: Diagnosis not present

## 2015-02-19 DIAGNOSIS — Z049 Encounter for examination and observation for unspecified reason: Secondary | ICD-10-CM | POA: Diagnosis not present

## 2015-02-21 DIAGNOSIS — R51 Headache: Secondary | ICD-10-CM | POA: Diagnosis not present

## 2015-02-21 DIAGNOSIS — M4802 Spinal stenosis, cervical region: Secondary | ICD-10-CM | POA: Diagnosis not present

## 2015-02-21 DIAGNOSIS — M5032 Other cervical disc degeneration, mid-cervical region: Secondary | ICD-10-CM | POA: Diagnosis not present

## 2015-03-02 ENCOUNTER — Encounter (HOSPITAL_COMMUNITY)
Admission: RE | Admit: 2015-03-02 | Discharge: 2015-03-02 | Disposition: A | Discharge status: Home / Self Care | Payer: Medicare Other | Source: Ambulatory Visit | Attending: Internal Medicine | Admitting: Internal Medicine

## 2015-03-02 DIAGNOSIS — K5190 Ulcerative colitis, unspecified, without complications: Secondary | ICD-10-CM | POA: Insufficient documentation

## 2015-03-02 MED ORDER — SODIUM CHLORIDE 0.9 % IV SOLN
INTRAVENOUS | Status: DC
  Administered 2015-03-02: 09:00:00 via INTRAVENOUS

## 2015-03-02 MED ORDER — SODIUM CHLORIDE 0.9 % IV SOLN
500.0000 mg | INTRAVENOUS | Status: DC
  Administered 2015-03-02: 500 mg via INTRAVENOUS
  Filled 2015-03-02: qty 50

## 2015-03-08 DIAGNOSIS — M542 Cervicalgia: Secondary | ICD-10-CM | POA: Diagnosis not present

## 2015-03-08 DIAGNOSIS — R51 Headache: Secondary | ICD-10-CM | POA: Diagnosis not present

## 2015-03-08 DIAGNOSIS — G44229 Chronic tension-type headache, not intractable: Secondary | ICD-10-CM | POA: Diagnosis not present

## 2015-03-08 DIAGNOSIS — G518 Other disorders of facial nerve: Secondary | ICD-10-CM | POA: Diagnosis not present

## 2015-03-08 DIAGNOSIS — M791 Myalgia: Secondary | ICD-10-CM | POA: Diagnosis not present

## 2015-03-09 ENCOUNTER — Other Ambulatory Visit (INDEPENDENT_AMBULATORY_CARE_PROVIDER_SITE_OTHER): Payer: Self-pay | Admitting: Internal Medicine

## 2015-03-14 DIAGNOSIS — L110 Acquired keratosis follicularis: Secondary | ICD-10-CM | POA: Diagnosis not present

## 2015-03-14 DIAGNOSIS — B351 Tinea unguium: Secondary | ICD-10-CM | POA: Diagnosis not present

## 2015-03-14 DIAGNOSIS — E1142 Type 2 diabetes mellitus with diabetic polyneuropathy: Secondary | ICD-10-CM | POA: Diagnosis not present

## 2015-03-28 DIAGNOSIS — G518 Other disorders of facial nerve: Secondary | ICD-10-CM | POA: Diagnosis not present

## 2015-03-28 DIAGNOSIS — M542 Cervicalgia: Secondary | ICD-10-CM | POA: Diagnosis not present

## 2015-03-28 DIAGNOSIS — R51 Headache: Secondary | ICD-10-CM | POA: Diagnosis not present

## 2015-03-28 DIAGNOSIS — M791 Myalgia: Secondary | ICD-10-CM | POA: Diagnosis not present

## 2015-03-28 DIAGNOSIS — G44229 Chronic tension-type headache, not intractable: Secondary | ICD-10-CM | POA: Diagnosis not present

## 2015-04-11 DIAGNOSIS — G44229 Chronic tension-type headache, not intractable: Secondary | ICD-10-CM | POA: Diagnosis not present

## 2015-04-11 DIAGNOSIS — R51 Headache: Secondary | ICD-10-CM | POA: Diagnosis not present

## 2015-04-11 DIAGNOSIS — M542 Cervicalgia: Secondary | ICD-10-CM | POA: Diagnosis not present

## 2015-04-11 DIAGNOSIS — G518 Other disorders of facial nerve: Secondary | ICD-10-CM | POA: Diagnosis not present

## 2015-04-11 DIAGNOSIS — M791 Myalgia: Secondary | ICD-10-CM | POA: Diagnosis not present

## 2015-04-25 DIAGNOSIS — G44229 Chronic tension-type headache, not intractable: Secondary | ICD-10-CM | POA: Diagnosis not present

## 2015-04-25 DIAGNOSIS — R51 Headache: Secondary | ICD-10-CM | POA: Diagnosis not present

## 2015-04-25 DIAGNOSIS — M791 Myalgia: Secondary | ICD-10-CM | POA: Diagnosis not present

## 2015-04-25 DIAGNOSIS — M542 Cervicalgia: Secondary | ICD-10-CM | POA: Diagnosis not present

## 2015-04-25 DIAGNOSIS — G518 Other disorders of facial nerve: Secondary | ICD-10-CM | POA: Diagnosis not present

## 2015-04-27 ENCOUNTER — Encounter (HOSPITAL_COMMUNITY)
Admission: RE | Admit: 2015-04-27 | Discharge: 2015-04-27 | Disposition: A | Discharge status: Home / Self Care | Payer: Medicare Other | Source: Ambulatory Visit | Attending: Internal Medicine | Admitting: Internal Medicine

## 2015-04-27 DIAGNOSIS — K5190 Ulcerative colitis, unspecified, without complications: Secondary | ICD-10-CM | POA: Diagnosis not present

## 2015-04-27 MED ORDER — SODIUM CHLORIDE 0.9 % IV SOLN
500.0000 mg | INTRAVENOUS | Status: DC
  Administered 2015-04-27: 500 mg via INTRAVENOUS
  Filled 2015-04-27: qty 50

## 2015-04-27 MED ORDER — ACETAMINOPHEN 325 MG PO TABS: 650.0000 mg | ORAL_TABLET | Freq: Four times a day (QID) | ORAL | Status: DC | PRN

## 2015-04-27 MED ORDER — SODIUM CHLORIDE 0.9 % IV SOLN
INTRAVENOUS | Status: DC
  Administered 2015-04-27: 08:00:00 via INTRAVENOUS

## 2015-04-27 MED ORDER — LORATADINE 10 MG PO TABS
10.0000 mg | ORAL_TABLET | Freq: Every day | ORAL | Status: DC
Start: 2015-04-27 — End: 2015-04-28

## 2015-04-27 MED ORDER — LORATADINE 10 MG PO TABS: 10.0000 mg | ORAL_TABLET | Freq: Every day | ORAL | Status: DC

## 2015-04-27 MED ORDER — DIPHENHYDRAMINE HCL 25 MG PO CAPS
50.0000 mg | ORAL_CAPSULE | Freq: Once | ORAL | Status: DC
Start: 2015-04-27 — End: 2015-04-28

## 2015-04-27 NOTE — Progress Notes (Signed)
Patient had already taken remeds at home to prednisone, tylenol, zyrtec and benadryl

## 2015-04-27 NOTE — Progress Notes (Signed)
Patient with adverse reactions at this time

## 2015-05-10 DIAGNOSIS — M542 Cervicalgia: Secondary | ICD-10-CM | POA: Diagnosis not present

## 2015-05-10 DIAGNOSIS — R51 Headache: Secondary | ICD-10-CM | POA: Diagnosis not present

## 2015-05-10 DIAGNOSIS — G518 Other disorders of facial nerve: Secondary | ICD-10-CM | POA: Diagnosis not present

## 2015-05-10 DIAGNOSIS — M791 Myalgia: Secondary | ICD-10-CM | POA: Diagnosis not present

## 2015-05-10 DIAGNOSIS — G44229 Chronic tension-type headache, not intractable: Secondary | ICD-10-CM | POA: Diagnosis not present

## 2015-05-23 DIAGNOSIS — B351 Tinea unguium: Secondary | ICD-10-CM | POA: Diagnosis not present

## 2015-05-23 DIAGNOSIS — L110 Acquired keratosis follicularis: Secondary | ICD-10-CM | POA: Diagnosis not present

## 2015-05-23 DIAGNOSIS — E1142 Type 2 diabetes mellitus with diabetic polyneuropathy: Secondary | ICD-10-CM | POA: Diagnosis not present

## 2015-05-25 DIAGNOSIS — M791 Myalgia: Secondary | ICD-10-CM | POA: Diagnosis not present

## 2015-05-25 DIAGNOSIS — G44229 Chronic tension-type headache, not intractable: Secondary | ICD-10-CM | POA: Diagnosis not present

## 2015-05-25 DIAGNOSIS — G518 Other disorders of facial nerve: Secondary | ICD-10-CM | POA: Diagnosis not present

## 2015-05-25 DIAGNOSIS — M542 Cervicalgia: Secondary | ICD-10-CM | POA: Diagnosis not present

## 2015-05-25 DIAGNOSIS — R51 Headache: Secondary | ICD-10-CM | POA: Diagnosis not present

## 2015-05-30 DIAGNOSIS — R5383 Other fatigue: Secondary | ICD-10-CM | POA: Diagnosis not present

## 2015-05-30 DIAGNOSIS — E782 Mixed hyperlipidemia: Secondary | ICD-10-CM | POA: Diagnosis not present

## 2015-05-30 DIAGNOSIS — E1165 Type 2 diabetes mellitus with hyperglycemia: Secondary | ICD-10-CM | POA: Diagnosis not present

## 2015-05-30 DIAGNOSIS — I10 Essential (primary) hypertension: Secondary | ICD-10-CM | POA: Diagnosis not present

## 2015-06-06 DIAGNOSIS — D696 Thrombocytopenia, unspecified: Secondary | ICD-10-CM | POA: Diagnosis not present

## 2015-06-06 DIAGNOSIS — E1122 Type 2 diabetes mellitus with diabetic chronic kidney disease: Secondary | ICD-10-CM | POA: Diagnosis not present

## 2015-06-06 DIAGNOSIS — E782 Mixed hyperlipidemia: Secondary | ICD-10-CM | POA: Diagnosis not present

## 2015-06-06 DIAGNOSIS — G43909 Migraine, unspecified, not intractable, without status migrainosus: Secondary | ICD-10-CM | POA: Diagnosis not present

## 2015-06-06 DIAGNOSIS — B354 Tinea corporis: Secondary | ICD-10-CM | POA: Diagnosis not present

## 2015-06-06 DIAGNOSIS — I10 Essential (primary) hypertension: Secondary | ICD-10-CM | POA: Diagnosis not present

## 2015-06-06 DIAGNOSIS — G4733 Obstructive sleep apnea (adult) (pediatric): Secondary | ICD-10-CM | POA: Diagnosis not present

## 2015-06-06 DIAGNOSIS — K5100 Ulcerative (chronic) pancolitis without complications: Secondary | ICD-10-CM | POA: Diagnosis not present

## 2015-06-19 ENCOUNTER — Ambulatory Visit (INDEPENDENT_AMBULATORY_CARE_PROVIDER_SITE_OTHER): Payer: Medicare Other | Admitting: Internal Medicine

## 2015-06-19 ENCOUNTER — Encounter (INDEPENDENT_AMBULATORY_CARE_PROVIDER_SITE_OTHER): Payer: Self-pay | Admitting: Internal Medicine

## 2015-06-19 VITALS — BP 112/74 | HR 72 | Temp 97.4°F | Resp 18 | Ht 72.0 in | Wt 204.7 lb

## 2015-06-19 DIAGNOSIS — K5190 Ulcerative colitis, unspecified, without complications: Secondary | ICD-10-CM | POA: Diagnosis not present

## 2015-06-19 DIAGNOSIS — R7401 Elevation of levels of liver transaminase levels: Secondary | ICD-10-CM

## 2015-06-19 DIAGNOSIS — R740 Nonspecific elevation of levels of transaminase and lactic acid dehydrogenase [LDH]: Secondary | ICD-10-CM | POA: Diagnosis not present

## 2015-06-19 DIAGNOSIS — E11319 Type 2 diabetes mellitus with unspecified diabetic retinopathy without macular edema: Secondary | ICD-10-CM | POA: Diagnosis not present

## 2015-06-19 NOTE — Progress Notes (Signed)
Presenting complaint;  Follow-up for UC and GERD.  Subjective:  Manuel Atkins is 81 year old Caucasian male who is here for scheduled visit. He was last seen on 12/18/2014. He is doing well. He rarely has heartburn. He denies dysphagia. His bowels move daily. Usually one stool a day and occasionally 2. He hasn't seen blood in his stool for more than 2 years. He is not having any side effects with infliximab. He has lost 6 pounds in the last 6 months. He is watching his diet and trying to walk. He takes alprazolam occasionally and he was begun on imipramine recently by Dr. Domingo Cocking for headache. Steroid-induced is did not help. He states he had lab studies by Dr. Gar Ponto about 2 weeks ago and he believes liver tests were normal. He is due for infliximab infusion this Friday. He says hemoglobin A1c was 7.1. It had never been this high before. He is trying to bring it down.   Current Medications: Outpatient Encounter Prescriptions as of 06/19/2015  Medication Sig  . ALPRAZolam (XANAX) 0.25 MG tablet TAKE ONE TABLET BY MOUTH THREE TIMES DAILY AS NEEDED FOR ANXIETY  . aspirin EC 81 MG tablet Take 81 mg by mouth daily.  . clobetasol cream (TEMOVATE) 1.61 % 1 application 2 (two) times daily as needed.   Marland Kitchen imipramine (TOFRANIL) 25 MG tablet TAKE TWO (2) TABLETS BY MOUTH AT BEDTIME; TITRATE AS DIRECTED  . ketoconazole (NIZORAL) 2 % cream Apply 1 application topically 2 (two) times daily.   Marland Kitchen LIALDA 1.2 G EC tablet 1.2 g. Patient takes 2 tablets twice a day  . losartan (COZAAR) 100 MG tablet Take 100 mg by mouth daily.  . metFORMIN (GLUCOPHAGE) 500 MG tablet Take 1,000 mg by mouth every evening.   . Multiple Vitamins-Minerals (CENTRUM SILVER PO) Take by mouth daily.  . pantoprazole (PROTONIX) 40 MG tablet TAKE ONE TABLET BY MOUTH EVERY *OTHER* DAY  . Probiotic Product (PROBIOTIC DAILY PO) Take by mouth daily.  . sodium chloride 0.9 % SOLN with inFLIXimab 100 MG SOLR Inject into the vein. Last infusion was  07/08/13.   No facility-administered encounter medications on file as of 06/19/2015.     Objective: Blood pressure 112/74, pulse 72, temperature 97.4 F (36.3 C), temperature source Oral, resp. rate 18, height 6' (1.829 m), weight 204 lb 11.2 oz (92.851 kg).  patient is alert and in no acute distress. Conjunctiva is pink. Sclera is nonicteric Oropharyngeal mucosa is normal. No neck masses or thyromegaly noted. Cardiac exam with regular rhythm normal S1 and S2. he has faint systolic ejection murmur heard at left sternal border and aortic area. Lungs are clear to auscultation. Abdomen is full but soft and nontender without organomegaly or masses.  No LE edema or clubbing noted.  Labs/studies Results:  AST and ALT were 39 and 52 respectively on 01/16/2015    Assessment:  #1. Chronic ulcerative colitis. He remains in remission. He is not having any side effects with infliximab. #2. 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 and eradicated with 3 months of therapy. #3. History of elevated ALT. Felt to be due to fatty liver given that he is diabetic. Patient states he had blood work by Dr. Quillian Quince 2 weeks ago. Will request copy for review. #4. GERD on placated by distal esophageal stricture dilated 2 years ago. Heartburns well controlled with therapy and he is not having dysphagia.   Plan:  Patient will continue pantoprazole and Lialda at current schedule. Continue infliximab infusion every  8 weeks. Requests copy of recent blood work from Dr. Coralyn Mark Daniel's office. If ALT or transaminases remain elevated will proceed with upper abdominal ultrasound. Office visit in 6 months.

## 2015-06-19 NOTE — Patient Instructions (Signed)
Will request copy of recent blood work from Dr. Olena Heckle office for review.

## 2015-06-20 ENCOUNTER — Other Ambulatory Visit (INDEPENDENT_AMBULATORY_CARE_PROVIDER_SITE_OTHER): Payer: Self-pay | Admitting: Internal Medicine

## 2015-06-20 ENCOUNTER — Encounter (INDEPENDENT_AMBULATORY_CARE_PROVIDER_SITE_OTHER): Payer: Self-pay

## 2015-06-20 DIAGNOSIS — R748 Abnormal levels of other serum enzymes: Secondary | ICD-10-CM

## 2015-06-22 ENCOUNTER — Encounter (HOSPITAL_COMMUNITY)
Admission: RE | Admit: 2015-06-22 | Discharge: 2015-06-22 | Disposition: A | Discharge status: Home / Self Care | Payer: Medicare Other | Source: Ambulatory Visit | Attending: Internal Medicine | Admitting: Internal Medicine

## 2015-06-22 DIAGNOSIS — K5190 Ulcerative colitis, unspecified, without complications: Secondary | ICD-10-CM | POA: Diagnosis not present

## 2015-06-22 MED ORDER — SODIUM CHLORIDE 0.9 % IV SOLN
INTRAVENOUS | Status: DC
  Administered 2015-06-22: 08:00:00 via INTRAVENOUS

## 2015-06-22 MED ORDER — LORATADINE 10 MG PO TABS: 10.0000 mg | ORAL_TABLET | Freq: Once | ORAL | Status: DC

## 2015-06-22 MED ORDER — DIPHENHYDRAMINE HCL 25 MG PO CAPS
50.0000 mg | ORAL_CAPSULE | Freq: Once | ORAL | Status: DC
  Filled 2015-06-22: qty 2

## 2015-06-22 MED ORDER — INFLIXIMAB 100 MG IV SOLR
5.0000 mg/kg | Freq: Once | INTRAVENOUS | Status: AC
  Administered 2015-06-22: 500 mg via INTRAVENOUS
  Filled 2015-06-22: qty 50

## 2015-06-22 MED ORDER — ACETAMINOPHEN 325 MG PO TABS: 650.0000 mg | ORAL_TABLET | Freq: Once | ORAL | Status: DC

## 2015-06-22 NOTE — Progress Notes (Signed)
Patient had pre medicated with tylenol benadryl and claratin at home

## 2015-06-25 ENCOUNTER — Ambulatory Visit (HOSPITAL_COMMUNITY)
Admission: RE | Admit: 2015-06-25 | Discharge: 2015-06-25 | Disposition: A | Discharge status: Home / Self Care | Payer: Medicare Other | Source: Ambulatory Visit | Attending: Internal Medicine | Admitting: Internal Medicine

## 2015-06-25 DIAGNOSIS — R748 Abnormal levels of other serum enzymes: Secondary | ICD-10-CM

## 2015-06-25 DIAGNOSIS — R7989 Other specified abnormal findings of blood chemistry: Secondary | ICD-10-CM | POA: Diagnosis not present

## 2015-06-25 DIAGNOSIS — Z9049 Acquired absence of other specified parts of digestive tract: Secondary | ICD-10-CM | POA: Insufficient documentation

## 2015-06-25 DIAGNOSIS — K769 Liver disease, unspecified: Secondary | ICD-10-CM | POA: Insufficient documentation

## 2015-06-25 DIAGNOSIS — R945 Abnormal results of liver function studies: Secondary | ICD-10-CM | POA: Diagnosis present

## 2015-07-02 DIAGNOSIS — R3915 Urgency of urination: Secondary | ICD-10-CM | POA: Diagnosis not present

## 2015-07-02 DIAGNOSIS — R300 Dysuria: Secondary | ICD-10-CM | POA: Diagnosis not present

## 2015-07-06 DIAGNOSIS — G44229 Chronic tension-type headache, not intractable: Secondary | ICD-10-CM | POA: Diagnosis not present

## 2015-07-12 ENCOUNTER — Encounter: Payer: Self-pay | Admitting: *Deleted

## 2015-07-12 ENCOUNTER — Other Ambulatory Visit: Payer: Self-pay | Admitting: *Deleted

## 2015-07-12 NOTE — Patient Outreach (Signed)
07/12/15- referral received from data analysis (MD office), telephone call to patient and tried to explain [facility] program when pt states " I don't know about this phone call"  Pt says he does not agree to anything that he is not sure of or does not know what it is, pt refuses [facility] services but is agreeable to have [facility] information mailed to his home so he can speak with primary MD about [facility] services. RN CM faxed letter to primary MD Dr. Quillian Quince informing pt refuses services, mailed [facility] information to pt home.  Jacqlyn Larsen [facility], [place] Coordinator [phone]

## 2015-07-12 NOTE — Patient Outreach (Signed)
[place] [facility]) Care Management  07/12/2015  Manuel Atkins 11/13/1932 [id number]   Referral from MD with notes, assigned Jacqlyn Larsen, RN to outreach.  Thanks, Ronnell Freshwater. [place], [place] Assistant Phone: [phone] Fax: [phone]

## 2015-07-18 NOTE — Patient Outreach (Signed)
[facility]) Care Management  07/18/2015  RALLY OUCH 1933/01/12 [id number]   Notification from Jacqlyn Larsen, RN to close case due to patient refused [place] Management services.  Thanks, Ronnell Freshwater. [place], [place] Assistant Phone: [phone] Fax: [phone]

## 2015-08-01 DIAGNOSIS — E1142 Type 2 diabetes mellitus with diabetic polyneuropathy: Secondary | ICD-10-CM | POA: Diagnosis not present

## 2015-08-01 DIAGNOSIS — B351 Tinea unguium: Secondary | ICD-10-CM | POA: Diagnosis not present

## 2015-08-01 DIAGNOSIS — L110 Acquired keratosis follicularis: Secondary | ICD-10-CM | POA: Diagnosis not present

## 2015-08-17 ENCOUNTER — Encounter (HOSPITAL_COMMUNITY)
Admission: RE | Admit: 2015-08-17 | Discharge: 2015-08-17 | Disposition: A | Discharge status: Home / Self Care | Payer: Medicare Other | Source: Ambulatory Visit | Attending: Internal Medicine | Admitting: Internal Medicine

## 2015-08-17 ENCOUNTER — Encounter (HOSPITAL_COMMUNITY): Payer: Self-pay

## 2015-08-17 DIAGNOSIS — K5190 Ulcerative colitis, unspecified, without complications: Secondary | ICD-10-CM | POA: Insufficient documentation

## 2015-08-17 MED ORDER — DIPHENHYDRAMINE HCL 25 MG PO CAPS
50.0000 mg | ORAL_CAPSULE | Freq: Once | ORAL | Status: DC
  Filled 2015-08-17: qty 2

## 2015-08-17 MED ORDER — LORATADINE 10 MG PO TABS: 10.0000 mg | ORAL_TABLET | Freq: Once | ORAL | Status: DC

## 2015-08-17 MED ORDER — SODIUM CHLORIDE 0.9 % IV SOLN
Freq: Once | INTRAVENOUS | Status: AC
  Administered 2015-08-17: 250 mL via INTRAVENOUS

## 2015-08-17 MED ORDER — ACETAMINOPHEN 325 MG PO TABS: 650.0000 mg | ORAL_TABLET | Freq: Once | ORAL | Status: DC

## 2015-08-17 MED ORDER — SODIUM CHLORIDE 0.9 % IV SOLN
5.0000 mg/kg | Freq: Once | INTRAVENOUS | Status: AC
  Administered 2015-08-17: 500 mg via INTRAVENOUS
  Filled 2015-08-17: qty 50

## 2015-09-07 DIAGNOSIS — Z23 Encounter for immunization: Secondary | ICD-10-CM | POA: Diagnosis not present

## 2015-09-28 DIAGNOSIS — I10 Essential (primary) hypertension: Secondary | ICD-10-CM | POA: Diagnosis not present

## 2015-09-28 DIAGNOSIS — E782 Mixed hyperlipidemia: Secondary | ICD-10-CM | POA: Diagnosis not present

## 2015-09-28 DIAGNOSIS — D696 Thrombocytopenia, unspecified: Secondary | ICD-10-CM | POA: Diagnosis not present

## 2015-09-28 DIAGNOSIS — E1122 Type 2 diabetes mellitus with diabetic chronic kidney disease: Secondary | ICD-10-CM | POA: Diagnosis not present

## 2015-10-03 ENCOUNTER — Other Ambulatory Visit (INDEPENDENT_AMBULATORY_CARE_PROVIDER_SITE_OTHER): Payer: Self-pay | Admitting: Internal Medicine

## 2015-10-05 DIAGNOSIS — G43909 Migraine, unspecified, not intractable, without status migrainosus: Secondary | ICD-10-CM | POA: Diagnosis not present

## 2015-10-05 DIAGNOSIS — I10 Essential (primary) hypertension: Secondary | ICD-10-CM | POA: Diagnosis not present

## 2015-10-05 DIAGNOSIS — E782 Mixed hyperlipidemia: Secondary | ICD-10-CM | POA: Diagnosis not present

## 2015-10-05 DIAGNOSIS — K5100 Ulcerative (chronic) pancolitis without complications: Secondary | ICD-10-CM | POA: Diagnosis not present

## 2015-10-05 DIAGNOSIS — K7581 Nonalcoholic steatohepatitis (NASH): Secondary | ICD-10-CM | POA: Diagnosis not present

## 2015-10-05 DIAGNOSIS — D696 Thrombocytopenia, unspecified: Secondary | ICD-10-CM | POA: Diagnosis not present

## 2015-10-05 DIAGNOSIS — E1122 Type 2 diabetes mellitus with diabetic chronic kidney disease: Secondary | ICD-10-CM | POA: Diagnosis not present

## 2015-10-05 DIAGNOSIS — G4733 Obstructive sleep apnea (adult) (pediatric): Secondary | ICD-10-CM | POA: Diagnosis not present

## 2015-10-08 DIAGNOSIS — G44229 Chronic tension-type headache, not intractable: Secondary | ICD-10-CM | POA: Diagnosis not present

## 2015-10-08 DIAGNOSIS — M791 Myalgia: Secondary | ICD-10-CM | POA: Diagnosis not present

## 2015-10-08 DIAGNOSIS — M542 Cervicalgia: Secondary | ICD-10-CM | POA: Diagnosis not present

## 2015-10-08 DIAGNOSIS — R51 Headache: Secondary | ICD-10-CM | POA: Diagnosis not present

## 2015-10-08 DIAGNOSIS — G518 Other disorders of facial nerve: Secondary | ICD-10-CM | POA: Diagnosis not present

## 2015-10-10 DIAGNOSIS — L110 Acquired keratosis follicularis: Secondary | ICD-10-CM | POA: Diagnosis not present

## 2015-10-10 DIAGNOSIS — E1142 Type 2 diabetes mellitus with diabetic polyneuropathy: Secondary | ICD-10-CM | POA: Diagnosis not present

## 2015-10-10 DIAGNOSIS — B351 Tinea unguium: Secondary | ICD-10-CM | POA: Diagnosis not present

## 2015-10-12 ENCOUNTER — Encounter (HOSPITAL_COMMUNITY)
Admission: RE | Admit: 2015-10-12 | Discharge: 2015-10-12 | Disposition: A | Discharge status: Home / Self Care | Payer: Medicare Other | Source: Ambulatory Visit | Attending: Internal Medicine | Admitting: Internal Medicine

## 2015-10-12 DIAGNOSIS — K5190 Ulcerative colitis, unspecified, without complications: Secondary | ICD-10-CM | POA: Insufficient documentation

## 2015-10-12 MED ORDER — SODIUM CHLORIDE 0.9 % IV SOLN
INTRAVENOUS | Status: DC
  Administered 2015-10-12: 250 mL via INTRAVENOUS

## 2015-10-12 MED ORDER — SODIUM CHLORIDE 0.9 % IV SOLN
500.0000 mg | INTRAVENOUS | Status: DC
  Administered 2015-10-12: 500 mg via INTRAVENOUS
  Filled 2015-10-12: qty 50

## 2015-10-15 ENCOUNTER — Other Ambulatory Visit (INDEPENDENT_AMBULATORY_CARE_PROVIDER_SITE_OTHER): Payer: Self-pay | Admitting: Internal Medicine

## 2015-10-18 DIAGNOSIS — I10 Essential (primary) hypertension: Secondary | ICD-10-CM | POA: Diagnosis not present

## 2015-10-18 DIAGNOSIS — E1122 Type 2 diabetes mellitus with diabetic chronic kidney disease: Secondary | ICD-10-CM | POA: Diagnosis not present

## 2015-10-18 DIAGNOSIS — D696 Thrombocytopenia, unspecified: Secondary | ICD-10-CM | POA: Diagnosis not present

## 2015-10-18 DIAGNOSIS — K7581 Nonalcoholic steatohepatitis (NASH): Secondary | ICD-10-CM | POA: Diagnosis not present

## 2015-10-18 DIAGNOSIS — G4733 Obstructive sleep apnea (adult) (pediatric): Secondary | ICD-10-CM | POA: Diagnosis not present

## 2015-10-18 DIAGNOSIS — Z0001 Encounter for general adult medical examination with abnormal findings: Secondary | ICD-10-CM | POA: Diagnosis not present

## 2015-10-18 DIAGNOSIS — E782 Mixed hyperlipidemia: Secondary | ICD-10-CM | POA: Diagnosis not present

## 2015-10-18 DIAGNOSIS — K5100 Ulcerative (chronic) pancolitis without complications: Secondary | ICD-10-CM | POA: Diagnosis not present

## 2015-10-18 DIAGNOSIS — N183 Chronic kidney disease, stage 3 (moderate): Secondary | ICD-10-CM | POA: Diagnosis not present

## 2015-10-18 DIAGNOSIS — G43909 Migraine, unspecified, not intractable, without status migrainosus: Secondary | ICD-10-CM | POA: Diagnosis not present

## 2015-10-25 DIAGNOSIS — L570 Actinic keratosis: Secondary | ICD-10-CM | POA: Diagnosis not present

## 2015-10-25 DIAGNOSIS — X32XXXD Exposure to sunlight, subsequent encounter: Secondary | ICD-10-CM | POA: Diagnosis not present

## 2015-10-25 DIAGNOSIS — J209 Acute bronchitis, unspecified: Secondary | ICD-10-CM | POA: Diagnosis not present

## 2015-10-25 DIAGNOSIS — Z85820 Personal history of malignant melanoma of skin: Secondary | ICD-10-CM | POA: Diagnosis not present

## 2015-10-25 DIAGNOSIS — Z1283 Encounter for screening for malignant neoplasm of skin: Secondary | ICD-10-CM | POA: Diagnosis not present

## 2015-10-25 DIAGNOSIS — Z08 Encounter for follow-up examination after completed treatment for malignant neoplasm: Secondary | ICD-10-CM | POA: Diagnosis not present

## 2015-11-15 DIAGNOSIS — F5104 Psychophysiologic insomnia: Secondary | ICD-10-CM | POA: Diagnosis not present

## 2015-11-15 DIAGNOSIS — G4733 Obstructive sleep apnea (adult) (pediatric): Secondary | ICD-10-CM | POA: Diagnosis not present

## 2015-12-07 ENCOUNTER — Encounter (HOSPITAL_COMMUNITY)
Admission: RE | Admit: 2015-12-07 | Discharge: 2015-12-07 | Disposition: A | Discharge status: Home / Self Care | Payer: Medicare Other | Source: Ambulatory Visit | Attending: Internal Medicine | Admitting: Internal Medicine

## 2015-12-07 DIAGNOSIS — Z79899 Other long term (current) drug therapy: Secondary | ICD-10-CM | POA: Insufficient documentation

## 2015-12-07 DIAGNOSIS — K5190 Ulcerative colitis, unspecified, without complications: Secondary | ICD-10-CM | POA: Insufficient documentation

## 2015-12-07 MED ORDER — SODIUM CHLORIDE 0.9 % IV SOLN
5.0000 mg/kg | INTRAVENOUS | Status: DC
  Administered 2015-12-07: 500 mg via INTRAVENOUS
  Filled 2015-12-07: qty 50

## 2015-12-07 MED ORDER — SODIUM CHLORIDE 0.9 % IV SOLN
Freq: Once | INTRAVENOUS | Status: AC
  Administered 2015-12-07: 250 mL via INTRAVENOUS

## 2015-12-20 DIAGNOSIS — R072 Precordial pain: Secondary | ICD-10-CM | POA: Diagnosis not present

## 2015-12-20 DIAGNOSIS — R05 Cough: Secondary | ICD-10-CM | POA: Diagnosis not present

## 2015-12-20 DIAGNOSIS — L110 Acquired keratosis follicularis: Secondary | ICD-10-CM | POA: Diagnosis not present

## 2015-12-20 DIAGNOSIS — I351 Nonrheumatic aortic (valve) insufficiency: Secondary | ICD-10-CM | POA: Diagnosis not present

## 2015-12-20 DIAGNOSIS — R0602 Shortness of breath: Secondary | ICD-10-CM | POA: Diagnosis not present

## 2015-12-20 DIAGNOSIS — B351 Tinea unguium: Secondary | ICD-10-CM | POA: Diagnosis not present

## 2015-12-20 DIAGNOSIS — E1142 Type 2 diabetes mellitus with diabetic polyneuropathy: Secondary | ICD-10-CM | POA: Diagnosis not present

## 2015-12-25 ENCOUNTER — Encounter (INDEPENDENT_AMBULATORY_CARE_PROVIDER_SITE_OTHER): Payer: Self-pay | Admitting: Internal Medicine

## 2015-12-25 ENCOUNTER — Ambulatory Visit (INDEPENDENT_AMBULATORY_CARE_PROVIDER_SITE_OTHER): Payer: Medicare Other | Admitting: Internal Medicine

## 2015-12-25 VITALS — BP 122/80 | HR 72 | Temp 96.0°F | Resp 18 | Ht 72.0 in | Wt 206.6 lb

## 2015-12-25 DIAGNOSIS — K760 Fatty (change of) liver, not elsewhere classified: Secondary | ICD-10-CM

## 2015-12-25 DIAGNOSIS — K2100 Gastro-esophageal reflux disease with esophagitis, without bleeding: Secondary | ICD-10-CM

## 2015-12-25 DIAGNOSIS — K5100 Ulcerative (chronic) pancolitis without complications: Secondary | ICD-10-CM

## 2015-12-25 NOTE — Progress Notes (Signed)
Presenting complaint;  Follow-up for ulcerative colitis GERD and fatty liver.  Subjective:  Patient is 82 year old Caucasian male who is here for scheduled visit. He was last seen on 06/19/2015. Following that visit he had upper abdominal ultrasound because of elevated transaminases and this study revealed fatty liver. He remains on infliximab infusion. Last dose was on 12/08/2015. He is on maintenance therapy every 8 weeks. He states he generally feels fatigued for 1 day after receiving infliximab although he felt fatigued for 2 days following last infusion. He has not experienced fever chest pain shortness of breath or skin rash. His appetite is good. He denies abdominal pain melena or rectal bleeding. He generally has 1-2 stools daily. More than 75% of his stools are formed. He is on imipramine for headache. He states is has worked very well. He rarely has heartburn. He has had few episodes of dysphagia with solids. It does not last but a few seconds. He underwent EGD with EGD in August 2014. He remains very active. He walks daily. Last week he walked about 12 miles.  He is however having exertional dyspnea. He had a normal chest x-ray and EKG. He is scheduled to undergo stress test by Dr. Gar Ponto. He has not had blood work since his last visit but he will have complete blood work in 2 months of the time of physical examination.    Current Medications: Outpatient Encounter Prescriptions as of 12/25/2015  Medication Sig  . ALPRAZolam (XANAX) 0.25 MG tablet TAKE ONE TABLET BY MOUTH THREE TIMES DAILY AS NEEDED FOR ANXIETY  . aspirin EC 81 MG tablet Take 81 mg by mouth daily.  . clobetasol cream (TEMOVATE) 7.25 % 1 application 2 (two) times daily as needed.   Marland Kitchen imipramine (TOFRANIL) 25 MG tablet TAKE TWO (3) TABLETS BY MOUTH AT BEDTIME; TITRATE AS DIRECTED  . ketoconazole (NIZORAL) 2 % cream Apply 1 application topically 2 (two) times daily.   Marland Kitchen LIALDA 1.2 G EC tablet TAKE TWO (2) TABLETS  BY MOUTH TWICE DAILY  . losartan (COZAAR) 100 MG tablet Take 100 mg by mouth daily.  . metFORMIN (GLUCOPHAGE) 500 MG tablet Take 1,000 mg by mouth every evening.   . Multiple Vitamins-Minerals (CENTRUM SILVER PO) Take by mouth daily.  . pantoprazole (PROTONIX) 40 MG tablet TAKE ONE TABLET BY MOUTH EVERY *OTHER* DAY  . Probiotic Product (PROBIOTIC DAILY PO) Take by mouth daily.  . sodium chloride 0.9 % SOLN with inFLIXimab 100 MG SOLR Inject into the vein. Last infusion was 07/08/13.   No facility-administered encounter medications on file as of 12/25/2015.     Objective: Blood pressure 122/80, pulse 72, temperature 96 F (35.6 C), temperature source Oral, resp. rate 18, height 6' (1.829 m), weight 206 lb 9.6 oz (93.713 kg). Patient is alert and in no acute distress. Conjunctiva is pink. Sclera is nonicteric Oropharyngeal mucosa is normal. No neck masses or thyromegaly noted. Cardiac exam with regular rhythm normal S1 and S2. No murmur or gallop noted. Lungs are clear to auscultation. Abdomen is full but soft and nontender without organomegaly or masses. No LE edema or clubbing noted.  Labs/studies Results: Ultrasound on 06/25/2015 revealed evidence of prior cholecystectomy, CBD measuring 7.1 mm and heterogeneous hepatic flexure consistent with fatty liver no evidence of splenomegaly.    Assessment:  #1. Chronic ulcerative colitis. Patient remains in remission. Disease duration is more than 50 years. Last colonoscopy was in July 2013. He will remain on infliximab until it loses efficacy or if  he develops side effects. #2. Mildly elevated transaminases secondary to fatty liver. Expect improvement in transaminases with increased physical activity. He will have blood work in 2 months. #3. History of erosive esophagitis with stricture. Status post esophageal dilation in August 2014. He is having dysphagia. If dysphagia worsens he will undergo EGD with EGD.   Plan:  Patient will call if  dysphagia worsens. Patient will get [place] copy of his next blood work to be done in April 2017. Continue infliximab and Lilada at present dose. Continue pantoprazole at 40 mg by mouth every morning Office visit in 6 months.

## 2015-12-25 NOTE — Patient Instructions (Signed)
Call if swallowing difficulty gets worse. 

## 2015-12-27 DIAGNOSIS — R0602 Shortness of breath: Secondary | ICD-10-CM | POA: Diagnosis not present

## 2015-12-27 DIAGNOSIS — R079 Chest pain, unspecified: Secondary | ICD-10-CM | POA: Diagnosis not present

## 2016-01-10 DIAGNOSIS — G44229 Chronic tension-type headache, not intractable: Secondary | ICD-10-CM | POA: Diagnosis not present

## 2016-02-01 ENCOUNTER — Encounter (HOSPITAL_COMMUNITY)
Admission: RE | Admit: 2016-02-01 | Discharge: 2016-02-01 | Disposition: A | Discharge status: Home / Self Care | Payer: Medicare Other | Source: Ambulatory Visit | Attending: Internal Medicine | Admitting: Internal Medicine

## 2016-02-01 ENCOUNTER — Encounter (HOSPITAL_COMMUNITY): Payer: Self-pay

## 2016-02-01 DIAGNOSIS — K5190 Ulcerative colitis, unspecified, without complications: Secondary | ICD-10-CM | POA: Diagnosis not present

## 2016-02-01 MED ORDER — SODIUM CHLORIDE 0.9 % IV SOLN
Freq: Once | INTRAVENOUS | Status: AC
  Administered 2016-02-01: 250 mL via INTRAVENOUS

## 2016-02-01 MED ORDER — SODIUM CHLORIDE 0.9 % IV SOLN
500.0000 mg | Freq: Once | INTRAVENOUS | Status: AC
  Administered 2016-02-01: 500 mg via INTRAVENOUS
  Filled 2016-02-01: qty 50

## 2016-02-19 DIAGNOSIS — D696 Thrombocytopenia, unspecified: Secondary | ICD-10-CM | POA: Diagnosis not present

## 2016-02-19 DIAGNOSIS — E1122 Type 2 diabetes mellitus with diabetic chronic kidney disease: Secondary | ICD-10-CM | POA: Diagnosis not present

## 2016-02-19 DIAGNOSIS — E782 Mixed hyperlipidemia: Secondary | ICD-10-CM | POA: Diagnosis not present

## 2016-02-19 DIAGNOSIS — I10 Essential (primary) hypertension: Secondary | ICD-10-CM | POA: Diagnosis not present

## 2016-02-27 DIAGNOSIS — I10 Essential (primary) hypertension: Secondary | ICD-10-CM | POA: Diagnosis not present

## 2016-02-27 DIAGNOSIS — G4733 Obstructive sleep apnea (adult) (pediatric): Secondary | ICD-10-CM | POA: Diagnosis not present

## 2016-02-27 DIAGNOSIS — G43909 Migraine, unspecified, not intractable, without status migrainosus: Secondary | ICD-10-CM | POA: Diagnosis not present

## 2016-02-27 DIAGNOSIS — D696 Thrombocytopenia, unspecified: Secondary | ICD-10-CM | POA: Diagnosis not present

## 2016-02-27 DIAGNOSIS — K7581 Nonalcoholic steatohepatitis (NASH): Secondary | ICD-10-CM | POA: Diagnosis not present

## 2016-02-27 DIAGNOSIS — K5100 Ulcerative (chronic) pancolitis without complications: Secondary | ICD-10-CM | POA: Diagnosis not present

## 2016-02-27 DIAGNOSIS — E1122 Type 2 diabetes mellitus with diabetic chronic kidney disease: Secondary | ICD-10-CM | POA: Diagnosis not present

## 2016-02-27 DIAGNOSIS — E782 Mixed hyperlipidemia: Secondary | ICD-10-CM | POA: Diagnosis not present

## 2016-03-13 DIAGNOSIS — E1142 Type 2 diabetes mellitus with diabetic polyneuropathy: Secondary | ICD-10-CM | POA: Diagnosis not present

## 2016-03-13 DIAGNOSIS — B351 Tinea unguium: Secondary | ICD-10-CM | POA: Diagnosis not present

## 2016-03-13 DIAGNOSIS — L110 Acquired keratosis follicularis: Secondary | ICD-10-CM | POA: Diagnosis not present

## 2016-03-28 ENCOUNTER — Encounter (HOSPITAL_COMMUNITY)
Admission: RE | Admit: 2016-03-28 | Discharge: 2016-03-28 | Disposition: A | Discharge status: Home / Self Care | Payer: Medicare Other | Source: Ambulatory Visit | Attending: Internal Medicine | Admitting: Internal Medicine

## 2016-03-28 DIAGNOSIS — K5190 Ulcerative colitis, unspecified, without complications: Secondary | ICD-10-CM | POA: Diagnosis not present

## 2016-03-28 MED ORDER — SODIUM CHLORIDE 0.9 % IV SOLN
500.0000 mg | INTRAVENOUS | Status: DC
  Administered 2016-03-28: 500 mg via INTRAVENOUS
  Filled 2016-03-28: qty 50

## 2016-03-28 MED ORDER — LORATADINE 10 MG PO TABS: 10.0000 mg | ORAL_TABLET | Freq: Every day | ORAL | Status: DC

## 2016-03-28 MED ORDER — ACETAMINOPHEN 325 MG PO TABS: 650.0000 mg | ORAL_TABLET | Freq: Once | ORAL | Status: DC

## 2016-03-28 MED ORDER — SODIUM CHLORIDE 0.9 % IV SOLN
INTRAVENOUS | Status: DC
  Administered 2016-03-28: 09:00:00 via INTRAVENOUS

## 2016-03-28 MED ORDER — DIPHENHYDRAMINE HCL 25 MG PO CAPS
50.0000 mg | ORAL_CAPSULE | Freq: Once | ORAL | Status: DC
Start: 2016-03-28 — End: 2016-03-29

## 2016-04-14 DIAGNOSIS — G44229 Chronic tension-type headache, not intractable: Secondary | ICD-10-CM | POA: Diagnosis not present

## 2016-05-05 DIAGNOSIS — G4733 Obstructive sleep apnea (adult) (pediatric): Secondary | ICD-10-CM | POA: Diagnosis not present

## 2016-05-05 DIAGNOSIS — I10 Essential (primary) hypertension: Secondary | ICD-10-CM | POA: Diagnosis not present

## 2016-05-05 DIAGNOSIS — R400 Somnolence: Secondary | ICD-10-CM | POA: Diagnosis not present

## 2016-05-05 DIAGNOSIS — N400 Enlarged prostate without lower urinary tract symptoms: Secondary | ICD-10-CM | POA: Diagnosis not present

## 2016-05-05 DIAGNOSIS — M545 Low back pain: Secondary | ICD-10-CM | POA: Diagnosis not present

## 2016-05-07 DIAGNOSIS — L570 Actinic keratosis: Secondary | ICD-10-CM | POA: Diagnosis not present

## 2016-05-07 DIAGNOSIS — Z85820 Personal history of malignant melanoma of skin: Secondary | ICD-10-CM | POA: Diagnosis not present

## 2016-05-19 DIAGNOSIS — G4733 Obstructive sleep apnea (adult) (pediatric): Secondary | ICD-10-CM | POA: Diagnosis not present

## 2016-05-23 ENCOUNTER — Encounter (HOSPITAL_COMMUNITY)
Admission: RE | Admit: 2016-05-23 | Discharge: 2016-05-23 | Disposition: A | Discharge status: Home / Self Care | Payer: Medicare Other | Source: Ambulatory Visit | Attending: Internal Medicine | Admitting: Internal Medicine

## 2016-05-23 DIAGNOSIS — K5190 Ulcerative colitis, unspecified, without complications: Secondary | ICD-10-CM | POA: Diagnosis not present

## 2016-05-23 MED ORDER — SODIUM CHLORIDE 0.9 % IV SOLN
INTRAVENOUS | Status: DC
Start: 2016-05-23 — End: 2016-05-24
  Administered 2016-05-23: 200 mL via INTRAVENOUS

## 2016-05-23 MED ORDER — SODIUM CHLORIDE 0.9 % IV SOLN
5.0000 mg/kg | Freq: Once | INTRAVENOUS | Status: AC
  Administered 2016-05-23: 500 mg via INTRAVENOUS
  Filled 2016-05-23: qty 50

## 2016-05-29 DIAGNOSIS — E1142 Type 2 diabetes mellitus with diabetic polyneuropathy: Secondary | ICD-10-CM | POA: Diagnosis not present

## 2016-05-29 DIAGNOSIS — L110 Acquired keratosis follicularis: Secondary | ICD-10-CM | POA: Diagnosis not present

## 2016-05-29 DIAGNOSIS — B351 Tinea unguium: Secondary | ICD-10-CM | POA: Diagnosis not present

## 2016-06-24 ENCOUNTER — Ambulatory Visit (INDEPENDENT_AMBULATORY_CARE_PROVIDER_SITE_OTHER): Payer: Medicare Other | Admitting: Internal Medicine

## 2016-06-24 ENCOUNTER — Encounter (INDEPENDENT_AMBULATORY_CARE_PROVIDER_SITE_OTHER): Payer: Self-pay | Admitting: Internal Medicine

## 2016-06-24 VITALS — BP 120/68 | HR 66 | Temp 97.4°F | Resp 18 | Ht 72.0 in | Wt 203.9 lb

## 2016-06-24 DIAGNOSIS — K2100 Gastro-esophageal reflux disease with esophagitis, without bleeding: Secondary | ICD-10-CM

## 2016-06-24 DIAGNOSIS — K5100 Ulcerative (chronic) pancolitis without complications: Secondary | ICD-10-CM

## 2016-06-24 DIAGNOSIS — F411 Generalized anxiety disorder: Secondary | ICD-10-CM

## 2016-06-24 MED ORDER — ALPRAZOLAM 0.25 MG PO TABS: 0.2500 mg | ORAL_TABLET | Freq: Three times a day (TID) | ORAL | 2 refills | Status: DC | PRN

## 2016-06-24 NOTE — Patient Instructions (Signed)
Please have Dr. Quillian Quince do PPD.

## 2016-06-24 NOTE — Progress Notes (Signed)
Presenting complaint;  Follow-up for ulcerative colitis GERD and anxiety.  Subjective:  Patient is 82 year old Caucasian male was here for scheduled visit. He was last seen 6 months ago. He states he is doing very well. He is having no side effects with Remicade. He is having 1-2 stools per day. At least 90% of his stools are formed. He does not even remember the last time he saw blood with his bowel movement. He has good appetite. He denies abdominal pain. He stays busy. He walks about 10 miles a week. Only side effect that he experiences Remicade is he feels washed out for 2 days. He is on Tofranil for neck pain. He has noted dry mouth. Heartburn is well controlled with pantoprazole. He is not having dysphagia. He needs new prescription for alprazolam which she does not take daily. He only takes it when he is stressed out and needs to be calmed. He also wants me to look at toes of right foot.  Current Medications: Outpatient Encounter Prescriptions as of 06/24/2016  Medication Sig  . ALPRAZolam (XANAX) 0.25 MG tablet TAKE ONE TABLET BY MOUTH THREE TIMES DAILY AS NEEDED FOR ANXIETY  . aspirin EC 81 MG tablet Take 81 mg by mouth daily.  . clobetasol cream (TEMOVATE) 3.82 % 1 application 2 (two) times daily as needed.   Marland Kitchen imipramine (TOFRANIL) 25 MG tablet TAKE TWO (3) TABLETS BY MOUTH AT BEDTIME; TITRATE AS DIRECTED  . ketoconazole (NIZORAL) 2 % cream Apply 1 application topically 2 (two) times daily.   Marland Kitchen LIALDA 1.2 G EC tablet TAKE TWO (2) TABLETS BY MOUTH TWICE DAILY  . losartan (COZAAR) 100 MG tablet Take 100 mg by mouth daily.  . metFORMIN (GLUCOPHAGE) 500 MG tablet Take 1,000 mg by mouth every evening.   . Multiple Vitamins-Minerals (CENTRUM SILVER PO) Take by mouth daily.  . pantoprazole (PROTONIX) 40 MG tablet TAKE ONE TABLET BY MOUTH EVERY *OTHER* DAY  . Probiotic Product (PROBIOTIC DAILY PO) Take by mouth daily.  . sodium chloride 0.9 % SOLN with inFLIXimab 100 MG SOLR Inject into  the vein. Last infusion was 07/08/13.  . tamsulosin (FLOMAX) 0.4 MG CAPS capsule Take 0.1 mg by mouth once.  . traMADol (ULTRAM) 50 MG tablet Take by mouth every 6 (six) hours as needed for moderate pain.   No facility-administered encounter medications on file as of 06/24/2016.      Objective: Blood pressure 120/68, pulse 66, temperature 97.4 F (36.3 C), temperature source Oral, resp. rate 18, height 6' (1.829 m), weight 203 lb 14.4 oz (92.5 kg). Patient is alert and in no acute distress. Conjunctiva is pink. Sclera is nonicteric Oropharyngeal mucosa is normal. No neck masses or thyromegaly noted. Cardiac exam with regular rhythm normal S1 and S2. No murmur or gallop noted. Lungs are clear to auscultation. Abdomen full but soft and nontender without organomegaly or masses. No LE edema or clubbing noted. Pigmentation noted to skin above right ankle as well as dorsal aspect of his right foot and toes. No ulceration noted.  Labs/studies Results: Recent labs from Dr. Olena Heckle office not available.    Assessment:  #1. Chronic ulcerative colitis. He remains in remission while on Remicade and oral mesalamine. He will continue present combination for at least 2 years at which time may consider dropping Remicade. #2. GERD complicated by esophageal stricture. Heartburn is well controlled with every other day PPI. #3. Anxiety. He is on very low-dose of alprazolam.   Plan:  New prescription given for alprazolam.  He will have PPD by Dr. Gar Ponto. Office visit in 6 months.

## 2016-07-21 ENCOUNTER — Encounter (HOSPITAL_COMMUNITY)
Admission: RE | Admit: 2016-07-21 | Discharge: 2016-07-21 | Disposition: A | Discharge status: Home / Self Care | Payer: Medicare Other | Source: Ambulatory Visit | Attending: Internal Medicine | Admitting: Internal Medicine

## 2016-07-21 DIAGNOSIS — K5190 Ulcerative colitis, unspecified, without complications: Secondary | ICD-10-CM | POA: Insufficient documentation

## 2016-07-21 MED ORDER — SODIUM CHLORIDE 0.9 % IV SOLN
INTRAVENOUS | Status: DC
  Administered 2016-07-21: 250 mL via INTRAVENOUS

## 2016-07-21 MED ORDER — SODIUM CHLORIDE 0.9 % IV SOLN
5.0000 mg/kg | Freq: Once | INTRAVENOUS | Status: AC
  Administered 2016-07-21: 500 mg via INTRAVENOUS
  Filled 2016-07-21: qty 50

## 2016-07-21 NOTE — Progress Notes (Signed)
Pt took prednisone, tylenol, zyrtec and benadryl at home before coming to hospital.

## 2016-08-07 DIAGNOSIS — E1142 Type 2 diabetes mellitus with diabetic polyneuropathy: Secondary | ICD-10-CM | POA: Diagnosis not present

## 2016-08-07 DIAGNOSIS — L110 Acquired keratosis follicularis: Secondary | ICD-10-CM | POA: Diagnosis not present

## 2016-08-07 DIAGNOSIS — B351 Tinea unguium: Secondary | ICD-10-CM | POA: Diagnosis not present

## 2016-08-11 DIAGNOSIS — G44229 Chronic tension-type headache, not intractable: Secondary | ICD-10-CM | POA: Diagnosis not present

## 2016-09-04 DIAGNOSIS — Z23 Encounter for immunization: Secondary | ICD-10-CM | POA: Diagnosis not present

## 2016-09-15 ENCOUNTER — Encounter (HOSPITAL_COMMUNITY)
Admission: RE | Admit: 2016-09-15 | Discharge: 2016-09-15 | Disposition: A | Discharge status: Home / Self Care | Payer: Medicare Other | Source: Ambulatory Visit | Attending: Internal Medicine | Admitting: Internal Medicine

## 2016-09-15 DIAGNOSIS — K5190 Ulcerative colitis, unspecified, without complications: Secondary | ICD-10-CM | POA: Insufficient documentation

## 2016-09-15 MED ORDER — SODIUM CHLORIDE 0.9 % IV SOLN
500.0000 mg | Freq: Once | INTRAVENOUS | Status: AC
  Administered 2016-09-15: 500 mg via INTRAVENOUS
  Filled 2016-09-15: qty 10

## 2016-09-15 MED ORDER — SODIUM CHLORIDE 0.9 % IV SOLN
INTRAVENOUS | Status: DC
  Administered 2016-09-15: 200 mL via INTRAVENOUS

## 2016-09-15 NOTE — Progress Notes (Signed)
Arrived for scheduled dose of iv remicade. States he took prescribed tylenol 650 mg, claritin 10 mg, and benadryl 50 mg at home at 0600 today.

## 2016-09-16 DIAGNOSIS — Z7984 Long term (current) use of oral hypoglycemic drugs: Secondary | ICD-10-CM | POA: Diagnosis not present

## 2016-09-16 DIAGNOSIS — H2513 Age-related nuclear cataract, bilateral: Secondary | ICD-10-CM | POA: Diagnosis not present

## 2016-09-16 DIAGNOSIS — H35031 Hypertensive retinopathy, right eye: Secondary | ICD-10-CM | POA: Diagnosis not present

## 2016-09-16 DIAGNOSIS — H40001 Preglaucoma, unspecified, right eye: Secondary | ICD-10-CM | POA: Diagnosis not present

## 2016-09-16 DIAGNOSIS — E119 Type 2 diabetes mellitus without complications: Secondary | ICD-10-CM | POA: Diagnosis not present

## 2016-09-16 DIAGNOSIS — H21233 Degeneration of iris (pigmentary), bilateral: Secondary | ICD-10-CM | POA: Diagnosis not present

## 2016-09-16 DIAGNOSIS — H25013 Cortical age-related cataract, bilateral: Secondary | ICD-10-CM | POA: Diagnosis not present

## 2016-09-16 DIAGNOSIS — H524 Presbyopia: Secondary | ICD-10-CM | POA: Diagnosis not present

## 2016-09-16 DIAGNOSIS — H35033 Hypertensive retinopathy, bilateral: Secondary | ICD-10-CM | POA: Diagnosis not present

## 2016-09-16 DIAGNOSIS — H40013 Open angle with borderline findings, low risk, bilateral: Secondary | ICD-10-CM | POA: Diagnosis not present

## 2016-09-16 DIAGNOSIS — H40053 Ocular hypertension, bilateral: Secondary | ICD-10-CM | POA: Diagnosis not present

## 2016-09-16 DIAGNOSIS — I10 Essential (primary) hypertension: Secondary | ICD-10-CM | POA: Diagnosis not present

## 2016-09-20 ENCOUNTER — Other Ambulatory Visit (INDEPENDENT_AMBULATORY_CARE_PROVIDER_SITE_OTHER): Payer: Self-pay | Admitting: Internal Medicine

## 2016-10-16 DIAGNOSIS — G4733 Obstructive sleep apnea (adult) (pediatric): Secondary | ICD-10-CM | POA: Diagnosis not present

## 2016-10-16 DIAGNOSIS — R0981 Nasal congestion: Secondary | ICD-10-CM | POA: Diagnosis not present

## 2016-10-16 DIAGNOSIS — R682 Dry mouth, unspecified: Secondary | ICD-10-CM | POA: Diagnosis not present

## 2016-10-21 ENCOUNTER — Other Ambulatory Visit (INDEPENDENT_AMBULATORY_CARE_PROVIDER_SITE_OTHER): Payer: Self-pay | Admitting: Internal Medicine

## 2016-10-23 DIAGNOSIS — H40013 Open angle with borderline findings, low risk, bilateral: Secondary | ICD-10-CM | POA: Diagnosis not present

## 2016-10-23 DIAGNOSIS — H2513 Age-related nuclear cataract, bilateral: Secondary | ICD-10-CM | POA: Diagnosis not present

## 2016-10-23 DIAGNOSIS — H25013 Cortical age-related cataract, bilateral: Secondary | ICD-10-CM | POA: Diagnosis not present

## 2016-10-23 DIAGNOSIS — B351 Tinea unguium: Secondary | ICD-10-CM | POA: Diagnosis not present

## 2016-10-23 DIAGNOSIS — H43813 Vitreous degeneration, bilateral: Secondary | ICD-10-CM | POA: Diagnosis not present

## 2016-10-23 DIAGNOSIS — L110 Acquired keratosis follicularis: Secondary | ICD-10-CM | POA: Diagnosis not present

## 2016-10-23 DIAGNOSIS — E1142 Type 2 diabetes mellitus with diabetic polyneuropathy: Secondary | ICD-10-CM | POA: Diagnosis not present

## 2016-10-27 DIAGNOSIS — N183 Chronic kidney disease, stage 3 (moderate): Secondary | ICD-10-CM | POA: Diagnosis not present

## 2016-10-27 DIAGNOSIS — I10 Essential (primary) hypertension: Secondary | ICD-10-CM | POA: Diagnosis not present

## 2016-10-27 DIAGNOSIS — E782 Mixed hyperlipidemia: Secondary | ICD-10-CM | POA: Diagnosis not present

## 2016-10-27 DIAGNOSIS — E1165 Type 2 diabetes mellitus with hyperglycemia: Secondary | ICD-10-CM | POA: Diagnosis not present

## 2016-10-27 DIAGNOSIS — R5383 Other fatigue: Secondary | ICD-10-CM | POA: Diagnosis not present

## 2016-10-27 DIAGNOSIS — K7581 Nonalcoholic steatohepatitis (NASH): Secondary | ICD-10-CM | POA: Diagnosis not present

## 2016-11-06 DIAGNOSIS — Z0001 Encounter for general adult medical examination with abnormal findings: Secondary | ICD-10-CM | POA: Diagnosis not present

## 2016-11-06 DIAGNOSIS — G43909 Migraine, unspecified, not intractable, without status migrainosus: Secondary | ICD-10-CM | POA: Diagnosis not present

## 2016-11-06 DIAGNOSIS — K7581 Nonalcoholic steatohepatitis (NASH): Secondary | ICD-10-CM | POA: Diagnosis not present

## 2016-11-06 DIAGNOSIS — Z1212 Encounter for screening for malignant neoplasm of rectum: Secondary | ICD-10-CM | POA: Diagnosis not present

## 2016-11-06 DIAGNOSIS — D696 Thrombocytopenia, unspecified: Secondary | ICD-10-CM | POA: Diagnosis not present

## 2016-11-06 DIAGNOSIS — N183 Chronic kidney disease, stage 3 (moderate): Secondary | ICD-10-CM | POA: Diagnosis not present

## 2016-11-06 DIAGNOSIS — E1122 Type 2 diabetes mellitus with diabetic chronic kidney disease: Secondary | ICD-10-CM | POA: Diagnosis not present

## 2016-11-06 DIAGNOSIS — E782 Mixed hyperlipidemia: Secondary | ICD-10-CM | POA: Diagnosis not present

## 2016-11-11 ENCOUNTER — Encounter (HOSPITAL_COMMUNITY)
Admission: RE | Admit: 2016-11-11 | Discharge: 2016-11-11 | Disposition: A | Discharge status: Home / Self Care | Payer: Medicare Other | Source: Ambulatory Visit | Attending: Internal Medicine | Admitting: Internal Medicine

## 2016-11-11 ENCOUNTER — Encounter (HOSPITAL_COMMUNITY): Payer: Self-pay

## 2016-11-11 DIAGNOSIS — K5190 Ulcerative colitis, unspecified, without complications: Secondary | ICD-10-CM | POA: Diagnosis not present

## 2016-11-11 MED ORDER — SODIUM CHLORIDE 0.9 % IV SOLN
500.0000 mg | Freq: Once | INTRAVENOUS | Status: AC
  Administered 2016-11-11: 500 mg via INTRAVENOUS
  Filled 2016-11-11: qty 50

## 2016-11-11 MED ORDER — SODIUM CHLORIDE 0.9 % IV SOLN
Freq: Once | INTRAVENOUS | Status: AC
  Administered 2016-11-11: 08:00:00 via INTRAVENOUS

## 2016-12-30 ENCOUNTER — Ambulatory Visit (INDEPENDENT_AMBULATORY_CARE_PROVIDER_SITE_OTHER): Payer: Medicare Other | Admitting: Internal Medicine

## 2016-12-30 ENCOUNTER — Encounter (INDEPENDENT_AMBULATORY_CARE_PROVIDER_SITE_OTHER): Payer: Self-pay | Admitting: Internal Medicine

## 2016-12-30 VITALS — BP 144/76 | HR 70 | Temp 97.4°F | Resp 18 | Ht 72.0 in | Wt 207.3 lb

## 2016-12-30 DIAGNOSIS — K2100 Gastro-esophageal reflux disease with esophagitis, without bleeding: Secondary | ICD-10-CM

## 2016-12-30 DIAGNOSIS — K5100 Ulcerative (chronic) pancolitis without complications: Secondary | ICD-10-CM

## 2016-12-30 NOTE — Patient Instructions (Addendum)
Will requess copy of recent blood work from Dr. Arcola Jansky office.

## 2016-12-30 NOTE — Progress Notes (Signed)
Presenting complaint;  Follow-up for ulcerative colitis.  Subjective:  Patient is 83 year old Caucasian male who is here for scheduled visit. He was last seen in August 2017. He continues to do well. He remains on PPI every other day. He is not having any problems with heartburn or dysphagia. He does not takes Xanax and tramadol daily. He has good appetite. He denies abdominal pain. He usually has 1 formed stool daily. He does not even remember the last time he saw blood with his bowel movement. He walks almost every day. He feels he walks close to 10 miles a week. He states his transaminases have come down. He had blood work by Dr. Gar Ponto. Last A1c was 6.2.  Current Medications: Outpatient Encounter Prescriptions as of 12/30/2016  Medication Sig  . ALPRAZolam (XANAX) 0.25 MG tablet Take 1 tablet (0.25 mg total) by mouth 3 (three) times daily as needed. for anxiety  . aspirin EC 81 MG tablet Take 81 mg by mouth daily.  . clobetasol cream (TEMOVATE) 0.35 % 1 application 2 (two) times daily as needed.   Marland Kitchen imipramine (TOFRANIL) 25 MG tablet TAKE TWO (3) TABLETS BY MOUTH AT BEDTIME; TITRATE AS DIRECTED  . ketoconazole (NIZORAL) 2 % cream Apply 1 application topically 2 (two) times daily.   Marland Kitchen LIALDA 1.2 g EC tablet TAKE TWO (2) TABLETS BY MOUTH TWICE DAILY  . losartan (COZAAR) 100 MG tablet Take 100 mg by mouth daily.  . metFORMIN (GLUCOPHAGE) 500 MG tablet Take 1,000 mg by mouth every evening.   . Multiple Vitamins-Minerals (CENTRUM SILVER PO) Take by mouth daily.  . pantoprazole (PROTONIX) 40 MG tablet TAKE ONE TABLET BY MOUTH EVERY *OTHER* DAY  . Probiotic Product (PROBIOTIC DAILY PO) Take by mouth daily.  . sodium chloride 0.9 % SOLN with inFLIXimab 100 MG SOLR Inject into the vein. Last infusion was 07/08/13.  . tamsulosin (FLOMAX) 0.4 MG CAPS capsule Take 0.1 mg by mouth daily.   . traMADol (ULTRAM) 50 MG tablet Take by mouth every 6 (six) hours as needed for moderate pain.  Marland Kitchen  triamcinolone cream (KENALOG) 0.1 % Apply 1 application topically as needed.    No facility-administered encounter medications on file as of 12/30/2016.      Objective: Blood pressure (!) 144/76, pulse 70, temperature 97.4 F (36.3 C), temperature source Oral, resp. rate 18, height 6' (1.829 m), weight 207 lb 4.8 oz (94 kg). Patient is alert and in no acute distress. Conjunctiva is pink. Sclera is nonicteric Oropharyngeal mucosa is normal. No neck masses or thyromegaly noted. Cardiac exam with regular rhythm normal S1 and S2. No murmur or gallop noted. Lungs are clear to auscultation. Abdomen is full soft and nontender without organomegaly or masses. No LE edema or clubbing noted.  Labs/studies Results:   lab studies requested from Dr. Olena Heckle office.  Assessment:  #1. Chronic ulcerative colitis. He remains in remission. 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 He is not having any side effects with infliximab. #2. GERD complicated by esophageal stricture which was dilated 3-1/2 years ago. He is doing well with every other day PPI. #3. History of mildly elevated transaminases secondary to fatty liver. Reportedly transaminases have come down.   Plan:  Continue present medications. Will request copy of recent blood work from Dr. Olena Heckle office. Office visit in 6 months.

## 2017-01-01 DIAGNOSIS — B351 Tinea unguium: Secondary | ICD-10-CM | POA: Diagnosis not present

## 2017-01-01 DIAGNOSIS — L110 Acquired keratosis follicularis: Secondary | ICD-10-CM | POA: Diagnosis not present

## 2017-01-01 DIAGNOSIS — E1142 Type 2 diabetes mellitus with diabetic polyneuropathy: Secondary | ICD-10-CM | POA: Diagnosis not present

## 2017-01-06 ENCOUNTER — Encounter (HOSPITAL_COMMUNITY)
Admission: RE | Admit: 2017-01-06 | Discharge: 2017-01-06 | Disposition: A | Discharge status: Home / Self Care | Payer: Medicare Other | Source: Ambulatory Visit | Attending: Internal Medicine | Admitting: Internal Medicine

## 2017-01-06 DIAGNOSIS — K5190 Ulcerative colitis, unspecified, without complications: Secondary | ICD-10-CM | POA: Insufficient documentation

## 2017-01-06 MED ORDER — SODIUM CHLORIDE 0.9 % IV SOLN
INTRAVENOUS | Status: DC
  Administered 2017-01-06: 250 mL via INTRAVENOUS

## 2017-01-06 MED ORDER — SODIUM CHLORIDE 0.9 % IV SOLN
5.0000 mg/kg | Freq: Once | INTRAVENOUS | Status: AC
  Administered 2017-01-06: 500 mg via INTRAVENOUS
  Filled 2017-01-06: qty 50

## 2017-01-08 ENCOUNTER — Encounter (INDEPENDENT_AMBULATORY_CARE_PROVIDER_SITE_OTHER): Payer: Self-pay

## 2017-02-16 DIAGNOSIS — G44229 Chronic tension-type headache, not intractable: Secondary | ICD-10-CM | POA: Diagnosis not present

## 2017-02-25 DIAGNOSIS — R5383 Other fatigue: Secondary | ICD-10-CM | POA: Diagnosis not present

## 2017-02-25 DIAGNOSIS — E1165 Type 2 diabetes mellitus with hyperglycemia: Secondary | ICD-10-CM | POA: Diagnosis not present

## 2017-02-25 DIAGNOSIS — E1122 Type 2 diabetes mellitus with diabetic chronic kidney disease: Secondary | ICD-10-CM | POA: Diagnosis not present

## 2017-02-25 DIAGNOSIS — G4733 Obstructive sleep apnea (adult) (pediatric): Secondary | ICD-10-CM | POA: Diagnosis not present

## 2017-02-25 DIAGNOSIS — M546 Pain in thoracic spine: Secondary | ICD-10-CM | POA: Diagnosis not present

## 2017-02-25 DIAGNOSIS — D696 Thrombocytopenia, unspecified: Secondary | ICD-10-CM | POA: Diagnosis not present

## 2017-02-25 DIAGNOSIS — I351 Nonrheumatic aortic (valve) insufficiency: Secondary | ICD-10-CM | POA: Diagnosis not present

## 2017-02-25 DIAGNOSIS — Z9189 Other specified personal risk factors, not elsewhere classified: Secondary | ICD-10-CM | POA: Diagnosis not present

## 2017-02-25 DIAGNOSIS — N183 Chronic kidney disease, stage 3 (moderate): Secondary | ICD-10-CM | POA: Diagnosis not present

## 2017-02-25 DIAGNOSIS — M9901 Segmental and somatic dysfunction of cervical region: Secondary | ICD-10-CM | POA: Diagnosis not present

## 2017-02-25 DIAGNOSIS — M47812 Spondylosis without myelopathy or radiculopathy, cervical region: Secondary | ICD-10-CM | POA: Diagnosis not present

## 2017-02-25 DIAGNOSIS — I10 Essential (primary) hypertension: Secondary | ICD-10-CM | POA: Diagnosis not present

## 2017-02-25 DIAGNOSIS — E782 Mixed hyperlipidemia: Secondary | ICD-10-CM | POA: Diagnosis not present

## 2017-02-26 DIAGNOSIS — G44229 Chronic tension-type headache, not intractable: Secondary | ICD-10-CM | POA: Diagnosis not present

## 2017-02-26 DIAGNOSIS — R51 Headache: Secondary | ICD-10-CM | POA: Diagnosis not present

## 2017-02-26 DIAGNOSIS — M542 Cervicalgia: Secondary | ICD-10-CM | POA: Diagnosis not present

## 2017-02-26 DIAGNOSIS — M791 Myalgia: Secondary | ICD-10-CM | POA: Diagnosis not present

## 2017-02-26 DIAGNOSIS — G518 Other disorders of facial nerve: Secondary | ICD-10-CM | POA: Diagnosis not present

## 2017-02-27 DIAGNOSIS — M9901 Segmental and somatic dysfunction of cervical region: Secondary | ICD-10-CM | POA: Diagnosis not present

## 2017-02-27 DIAGNOSIS — M546 Pain in thoracic spine: Secondary | ICD-10-CM | POA: Diagnosis not present

## 2017-02-27 DIAGNOSIS — M47812 Spondylosis without myelopathy or radiculopathy, cervical region: Secondary | ICD-10-CM | POA: Diagnosis not present

## 2017-03-02 DIAGNOSIS — Z23 Encounter for immunization: Secondary | ICD-10-CM | POA: Diagnosis not present

## 2017-03-02 DIAGNOSIS — Z6828 Body mass index (BMI) 28.0-28.9, adult: Secondary | ICD-10-CM | POA: Diagnosis not present

## 2017-03-02 DIAGNOSIS — D696 Thrombocytopenia, unspecified: Secondary | ICD-10-CM | POA: Diagnosis not present

## 2017-03-02 DIAGNOSIS — G4733 Obstructive sleep apnea (adult) (pediatric): Secondary | ICD-10-CM | POA: Diagnosis not present

## 2017-03-02 DIAGNOSIS — Z1212 Encounter for screening for malignant neoplasm of rectum: Secondary | ICD-10-CM | POA: Diagnosis not present

## 2017-03-02 DIAGNOSIS — K5100 Ulcerative (chronic) pancolitis without complications: Secondary | ICD-10-CM | POA: Diagnosis not present

## 2017-03-02 DIAGNOSIS — E782 Mixed hyperlipidemia: Secondary | ICD-10-CM | POA: Diagnosis not present

## 2017-03-02 DIAGNOSIS — E1122 Type 2 diabetes mellitus with diabetic chronic kidney disease: Secondary | ICD-10-CM | POA: Diagnosis not present

## 2017-03-02 DIAGNOSIS — G43909 Migraine, unspecified, not intractable, without status migrainosus: Secondary | ICD-10-CM | POA: Diagnosis not present

## 2017-03-02 DIAGNOSIS — K7581 Nonalcoholic steatohepatitis (NASH): Secondary | ICD-10-CM | POA: Diagnosis not present

## 2017-03-02 DIAGNOSIS — N183 Chronic kidney disease, stage 3 (moderate): Secondary | ICD-10-CM | POA: Diagnosis not present

## 2017-03-02 DIAGNOSIS — I10 Essential (primary) hypertension: Secondary | ICD-10-CM | POA: Diagnosis not present

## 2017-03-03 ENCOUNTER — Encounter (HOSPITAL_COMMUNITY): Payer: Self-pay

## 2017-03-03 ENCOUNTER — Encounter (HOSPITAL_COMMUNITY)
Admission: RE | Admit: 2017-03-03 | Discharge: 2017-03-03 | Disposition: A | Discharge status: Home / Self Care | Payer: Medicare Other | Source: Ambulatory Visit | Attending: Internal Medicine | Admitting: Internal Medicine

## 2017-03-03 DIAGNOSIS — K5190 Ulcerative colitis, unspecified, without complications: Secondary | ICD-10-CM | POA: Insufficient documentation

## 2017-03-03 MED ORDER — ACETAMINOPHEN 325 MG PO TABS: 650.0000 mg | ORAL_TABLET | Freq: Once | ORAL | Status: DC

## 2017-03-03 MED ORDER — LORATADINE 10 MG PO TABS: 10.0000 mg | ORAL_TABLET | Freq: Once | ORAL | Status: DC

## 2017-03-03 MED ORDER — SODIUM CHLORIDE 0.9 % IV SOLN
INTRAVENOUS | Status: DC
  Administered 2017-03-03: 250 mL via INTRAVENOUS

## 2017-03-03 MED ORDER — DIPHENHYDRAMINE HCL 25 MG PO CAPS: 50.0000 mg | ORAL_CAPSULE | Freq: Once | ORAL | Status: DC

## 2017-03-03 MED ORDER — SODIUM CHLORIDE 0.9 % IV SOLN
5.0000 mg/kg | Freq: Once | INTRAVENOUS | Status: AC
  Administered 2017-03-03: 500 mg via INTRAVENOUS
  Filled 2017-03-03: qty 50

## 2017-03-04 DIAGNOSIS — M546 Pain in thoracic spine: Secondary | ICD-10-CM | POA: Diagnosis not present

## 2017-03-04 DIAGNOSIS — M9901 Segmental and somatic dysfunction of cervical region: Secondary | ICD-10-CM | POA: Diagnosis not present

## 2017-03-04 DIAGNOSIS — M47812 Spondylosis without myelopathy or radiculopathy, cervical region: Secondary | ICD-10-CM | POA: Diagnosis not present

## 2017-03-11 DIAGNOSIS — M546 Pain in thoracic spine: Secondary | ICD-10-CM | POA: Diagnosis not present

## 2017-03-11 DIAGNOSIS — M47812 Spondylosis without myelopathy or radiculopathy, cervical region: Secondary | ICD-10-CM | POA: Diagnosis not present

## 2017-03-11 DIAGNOSIS — M9901 Segmental and somatic dysfunction of cervical region: Secondary | ICD-10-CM | POA: Diagnosis not present

## 2017-03-12 DIAGNOSIS — L110 Acquired keratosis follicularis: Secondary | ICD-10-CM | POA: Diagnosis not present

## 2017-03-12 DIAGNOSIS — E1140 Type 2 diabetes mellitus with diabetic neuropathy, unspecified: Secondary | ICD-10-CM | POA: Diagnosis not present

## 2017-03-12 DIAGNOSIS — B351 Tinea unguium: Secondary | ICD-10-CM | POA: Diagnosis not present

## 2017-04-23 DIAGNOSIS — H40012 Open angle with borderline findings, low risk, left eye: Secondary | ICD-10-CM | POA: Diagnosis not present

## 2017-04-23 DIAGNOSIS — H40011 Open angle with borderline findings, low risk, right eye: Secondary | ICD-10-CM | POA: Diagnosis not present

## 2017-04-23 DIAGNOSIS — H43813 Vitreous degeneration, bilateral: Secondary | ICD-10-CM | POA: Diagnosis not present

## 2017-04-28 ENCOUNTER — Encounter (HOSPITAL_COMMUNITY)
Admission: RE | Admit: 2017-04-28 | Discharge: 2017-04-28 | Disposition: A | Discharge status: Home / Self Care | Payer: Medicare Other | Source: Ambulatory Visit | Attending: Internal Medicine | Admitting: Internal Medicine

## 2017-04-28 DIAGNOSIS — K5190 Ulcerative colitis, unspecified, without complications: Secondary | ICD-10-CM | POA: Diagnosis not present

## 2017-04-28 MED ORDER — SODIUM CHLORIDE 0.9 % IV SOLN
INTRAVENOUS | Status: DC
  Administered 2017-04-28: 09:00:00 via INTRAVENOUS

## 2017-04-28 MED ORDER — SODIUM CHLORIDE 0.9 % IV SOLN
5.0000 mg/kg | INTRAVENOUS | Status: DC
  Administered 2017-04-28: 500 mg via INTRAVENOUS
  Filled 2017-04-28: qty 50

## 2017-05-05 DIAGNOSIS — L570 Actinic keratosis: Secondary | ICD-10-CM | POA: Diagnosis not present

## 2017-05-05 DIAGNOSIS — Z85820 Personal history of malignant melanoma of skin: Secondary | ICD-10-CM | POA: Diagnosis not present

## 2017-05-25 DIAGNOSIS — H401211 Low-tension glaucoma, right eye, mild stage: Secondary | ICD-10-CM | POA: Diagnosis not present

## 2017-05-25 DIAGNOSIS — H40022 Open angle with borderline findings, high risk, left eye: Secondary | ICD-10-CM | POA: Diagnosis not present

## 2017-06-01 ENCOUNTER — Encounter (INDEPENDENT_AMBULATORY_CARE_PROVIDER_SITE_OTHER): Payer: Self-pay | Admitting: Internal Medicine

## 2017-06-04 DIAGNOSIS — L110 Acquired keratosis follicularis: Secondary | ICD-10-CM | POA: Diagnosis not present

## 2017-06-04 DIAGNOSIS — E1140 Type 2 diabetes mellitus with diabetic neuropathy, unspecified: Secondary | ICD-10-CM | POA: Diagnosis not present

## 2017-06-04 DIAGNOSIS — B351 Tinea unguium: Secondary | ICD-10-CM | POA: Diagnosis not present

## 2017-06-23 ENCOUNTER — Encounter (HOSPITAL_COMMUNITY)
Admission: RE | Admit: 2017-06-23 | Discharge: 2017-06-23 | Disposition: A | Discharge status: Home / Self Care | Payer: Medicare Other | Source: Ambulatory Visit | Attending: Internal Medicine | Admitting: Internal Medicine

## 2017-06-23 ENCOUNTER — Encounter (HOSPITAL_COMMUNITY): Payer: Self-pay

## 2017-06-23 DIAGNOSIS — K5190 Ulcerative colitis, unspecified, without complications: Secondary | ICD-10-CM | POA: Insufficient documentation

## 2017-06-23 MED ORDER — ACETAMINOPHEN 325 MG PO TABS: 650.0000 mg | ORAL_TABLET | Freq: Once | ORAL | Status: DC

## 2017-06-23 MED ORDER — LORATADINE 10 MG PO TABS: 10.0000 mg | ORAL_TABLET | Freq: Every day | ORAL | Status: DC

## 2017-06-23 MED ORDER — INFLIXIMAB 100 MG IV SOLR
500.0000 mg | Freq: Once | INTRAVENOUS | Status: AC
  Administered 2017-06-23: 500 mg via INTRAVENOUS
  Filled 2017-06-23: qty 50

## 2017-06-23 MED ORDER — SODIUM CHLORIDE 0.9 % IV SOLN
INTRAVENOUS | Status: DC
  Administered 2017-06-23: 250 mL via INTRAVENOUS

## 2017-06-23 MED ORDER — DIPHENHYDRAMINE HCL 25 MG PO CAPS: 50.0000 mg | ORAL_CAPSULE | Freq: Once | ORAL | Status: DC

## 2017-06-25 DIAGNOSIS — E782 Mixed hyperlipidemia: Secondary | ICD-10-CM | POA: Diagnosis not present

## 2017-06-25 DIAGNOSIS — G4733 Obstructive sleep apnea (adult) (pediatric): Secondary | ICD-10-CM | POA: Diagnosis not present

## 2017-06-25 DIAGNOSIS — Z9189 Other specified personal risk factors, not elsewhere classified: Secondary | ICD-10-CM | POA: Diagnosis not present

## 2017-06-25 DIAGNOSIS — E1165 Type 2 diabetes mellitus with hyperglycemia: Secondary | ICD-10-CM | POA: Diagnosis not present

## 2017-06-25 DIAGNOSIS — E1122 Type 2 diabetes mellitus with diabetic chronic kidney disease: Secondary | ICD-10-CM | POA: Diagnosis not present

## 2017-06-25 DIAGNOSIS — I10 Essential (primary) hypertension: Secondary | ICD-10-CM | POA: Diagnosis not present

## 2017-06-25 DIAGNOSIS — D696 Thrombocytopenia, unspecified: Secondary | ICD-10-CM | POA: Diagnosis not present

## 2017-06-25 DIAGNOSIS — I351 Nonrheumatic aortic (valve) insufficiency: Secondary | ICD-10-CM | POA: Diagnosis not present

## 2017-06-29 DIAGNOSIS — K7581 Nonalcoholic steatohepatitis (NASH): Secondary | ICD-10-CM | POA: Diagnosis not present

## 2017-06-29 DIAGNOSIS — N183 Chronic kidney disease, stage 3 (moderate): Secondary | ICD-10-CM | POA: Diagnosis not present

## 2017-06-29 DIAGNOSIS — I10 Essential (primary) hypertension: Secondary | ICD-10-CM | POA: Diagnosis not present

## 2017-06-29 DIAGNOSIS — G43909 Migraine, unspecified, not intractable, without status migrainosus: Secondary | ICD-10-CM | POA: Diagnosis not present

## 2017-06-29 DIAGNOSIS — K5100 Ulcerative (chronic) pancolitis without complications: Secondary | ICD-10-CM | POA: Diagnosis not present

## 2017-06-29 DIAGNOSIS — Z6828 Body mass index (BMI) 28.0-28.9, adult: Secondary | ICD-10-CM | POA: Diagnosis not present

## 2017-06-29 DIAGNOSIS — Z23 Encounter for immunization: Secondary | ICD-10-CM | POA: Diagnosis not present

## 2017-06-29 DIAGNOSIS — E1122 Type 2 diabetes mellitus with diabetic chronic kidney disease: Secondary | ICD-10-CM | POA: Diagnosis not present

## 2017-06-30 ENCOUNTER — Encounter (INDEPENDENT_AMBULATORY_CARE_PROVIDER_SITE_OTHER): Payer: Self-pay | Admitting: Internal Medicine

## 2017-06-30 ENCOUNTER — Ambulatory Visit (INDEPENDENT_AMBULATORY_CARE_PROVIDER_SITE_OTHER): Payer: Medicare Other | Admitting: Internal Medicine

## 2017-06-30 VITALS — BP 110/68 | HR 74 | Temp 96.9°F | Resp 18 | Ht 72.0 in | Wt 205.6 lb

## 2017-06-30 DIAGNOSIS — K760 Fatty (change of) liver, not elsewhere classified: Secondary | ICD-10-CM | POA: Diagnosis not present

## 2017-06-30 DIAGNOSIS — K2100 Gastro-esophageal reflux disease with esophagitis, without bleeding: Secondary | ICD-10-CM

## 2017-06-30 DIAGNOSIS — K5100 Ulcerative (chronic) pancolitis without complications: Secondary | ICD-10-CM

## 2017-06-30 NOTE — Progress Notes (Signed)
Presenting complaint;  Follow-up for ulcerative colitis.  Subjective:  Manuel Atkins is a 83-year-old Caucasian male who is here for scheduled visit. He has over 50 or history of ulcerative colitis GERD and elevated transaminases. He brings along a copy of blood work from Dr. Olena Heckle office. He says his transaminases are elevated. He states every time he exercised the numbers come down. Transaminases are normal. Years ago when he weighed 20 pounds less. He has one to 2 formed stools daily. He denies abdominal pain melena or rectal bleeding. He has very good appetite. He says he stating PPI 3 times a week as it is hard for him to take it every other day. He denies dysphagia and rarely experiences heartburn. He takes imipramine for headache. He is not having any side effects with infliximab. He is afraid to stop this medication for fear of relapse of his disease.  Current Medications: Outpatient Encounter Prescriptions as of 06/30/2017  Medication Sig  . ALPRAZolam (XANAX) 0.25 MG tablet Take 1 tablet (0.25 mg total) by mouth 3 (three) times daily as needed. for anxiety  . aspirin EC 81 MG tablet Take 81 mg by mouth daily.  . clobetasol cream (TEMOVATE) 1.61 % 1 application 2 (two) times daily as needed.   Marland Kitchen imipramine (TOFRANIL) 25 MG tablet TAKE TWO (3) TABLETS BY MOUTH AT BEDTIME; TITRATE AS DIRECTED  . ketoconazole (NIZORAL) 2 % cream Apply 1 application topically 2 (two) times daily.   Marland Kitchen LIALDA 1.2 g EC tablet TAKE TWO (2) TABLETS BY MOUTH TWICE DAILY  . losartan (COZAAR) 100 MG tablet Take 100 mg by mouth daily.  . metFORMIN (GLUCOPHAGE) 500 MG tablet Take 1,000 mg by mouth every evening.   . Multiple Vitamins-Minerals (CENTRUM SILVER PO) Take by mouth daily.  . pantoprazole (PROTONIX) 40 MG tablet TAKE ONE TABLET BY MOUTH EVERY *OTHER* DAY  . Probiotic Product (PROBIOTIC DAILY PO) Take by mouth daily.  . sodium chloride 0.9 % SOLN with inFLIXimab 100 MG SOLR Inject into the vein. Last infusion  was 07/08/13.  . tamsulosin (FLOMAX) 0.4 MG CAPS capsule Take 0.1 mg by mouth daily.   . traMADol (ULTRAM) 50 MG tablet Take by mouth every 6 (six) hours as needed for moderate pain.  Marland Kitchen triamcinolone cream (KENALOG) 0.1 % Apply 1 application topically as needed.    No facility-administered encounter medications on file as of 06/30/2017.      Objective: Blood pressure 110/68, pulse 74, temperature (!) 96.9 F (36.1 C), temperature source Oral, resp. rate 18, height 6' (1.829 m), weight 205 lb 9.6 oz (93.3 kg). Patient is alert and in no acute distress. Conjunctiva is pink. Sclera is nonicteric Oropharyngeal mucosa is normal. No neck masses or thyromegaly noted. Cardiac exam with regular rhythm normal S1 and S2. No murmur or gallop noted. Lungs are clear to auscultation. Abdomen is full but soft and nontender without organomegaly or masses. No LE edema or clubbing noted.  Labs/studies Results:  Lab data from 06/25/2017(Dr. Gar Ponto). Glucose 127, BUN 22 and creatinine 1.26. Serum sodium 140, potassium 4.5, chloride 104, CO2 22 Serum calcium 9.2 Bilirubin 0.7, AP 65, AST 55, ALT 58 and albumin 4.5.  AST and ALT were 45 and 50 respectively on 10/27/2016.  Assessment:  #1. Ulcerative colitis. He remains on infliximab and oral mesalamine. He is in remission. Last colonoscopy was in July 2013.  #2. GERD complicated by esophageal stricture status post dilation 4 years ago. Symptoms well controlled with dietary measures and PPI 3 times  a week.  #3. Elevated transaminases. Elevated transaminases appear to be secondary to fatty liver which is documented by ultrasound 2 years ago. Markers for hep B and C were negative. He does not have stigmata of chronic liver disease. He is to resume physical activity and lose weight.   Plan:  Patient encouraged to resume regular exercise such as walking 30 minutes 3-4 times a week. He will continue current GI medications. Will plan surveillance  colonoscopy after next office visit. Office visit in 6 months.

## 2017-06-30 NOTE — Patient Instructions (Signed)
Call if you have abdominal pain or rectal bleeding. Will plan colonoscopy after next visit in 6 months.

## 2017-08-10 DIAGNOSIS — G44229 Chronic tension-type headache, not intractable: Secondary | ICD-10-CM | POA: Diagnosis not present

## 2017-08-18 ENCOUNTER — Encounter (HOSPITAL_COMMUNITY): Payer: Self-pay

## 2017-08-18 ENCOUNTER — Encounter (HOSPITAL_COMMUNITY)
Admission: RE | Admit: 2017-08-18 | Discharge: 2017-08-18 | Disposition: A | Discharge status: Home / Self Care | Payer: Medicare Other | Source: Ambulatory Visit | Attending: Internal Medicine | Admitting: Internal Medicine

## 2017-08-18 DIAGNOSIS — K5190 Ulcerative colitis, unspecified, without complications: Secondary | ICD-10-CM | POA: Insufficient documentation

## 2017-08-18 MED ORDER — SODIUM CHLORIDE 0.9 % IV SOLN
5.0000 mg/kg | INTRAVENOUS | Status: DC
  Administered 2017-08-18: 500 mg via INTRAVENOUS
  Filled 2017-08-18: qty 50

## 2017-08-18 MED ORDER — SODIUM CHLORIDE 0.9 % IV SOLN
INTRAVENOUS | Status: DC
  Administered 2017-08-18: 09:00:00 via INTRAVENOUS

## 2017-08-20 DIAGNOSIS — L110 Acquired keratosis follicularis: Secondary | ICD-10-CM | POA: Diagnosis not present

## 2017-08-20 DIAGNOSIS — E1140 Type 2 diabetes mellitus with diabetic neuropathy, unspecified: Secondary | ICD-10-CM | POA: Diagnosis not present

## 2017-08-20 DIAGNOSIS — B351 Tinea unguium: Secondary | ICD-10-CM | POA: Diagnosis not present

## 2017-09-15 DIAGNOSIS — Z23 Encounter for immunization: Secondary | ICD-10-CM | POA: Diagnosis not present

## 2017-09-24 DIAGNOSIS — E11319 Type 2 diabetes mellitus with unspecified diabetic retinopathy without macular edema: Secondary | ICD-10-CM | POA: Diagnosis not present

## 2017-09-30 ENCOUNTER — Other Ambulatory Visit (INDEPENDENT_AMBULATORY_CARE_PROVIDER_SITE_OTHER): Payer: Self-pay | Admitting: Internal Medicine

## 2017-10-03 ENCOUNTER — Encounter (INDEPENDENT_AMBULATORY_CARE_PROVIDER_SITE_OTHER): Payer: Self-pay | Admitting: Internal Medicine

## 2017-10-13 ENCOUNTER — Encounter (HOSPITAL_COMMUNITY): Payer: Medicare Other

## 2017-10-14 DIAGNOSIS — R5383 Other fatigue: Secondary | ICD-10-CM | POA: Diagnosis not present

## 2017-10-14 DIAGNOSIS — G4733 Obstructive sleep apnea (adult) (pediatric): Secondary | ICD-10-CM | POA: Diagnosis not present

## 2017-10-16 ENCOUNTER — Encounter (HOSPITAL_COMMUNITY)
Admission: RE | Admit: 2017-10-16 | Discharge: 2017-10-16 | Disposition: A | Discharge status: Home / Self Care | Payer: Medicare Other | Source: Ambulatory Visit | Attending: Internal Medicine | Admitting: Internal Medicine

## 2017-10-16 ENCOUNTER — Encounter (HOSPITAL_COMMUNITY): Payer: Self-pay

## 2017-10-16 DIAGNOSIS — K5190 Ulcerative colitis, unspecified, without complications: Secondary | ICD-10-CM | POA: Insufficient documentation

## 2017-10-16 MED ORDER — SODIUM CHLORIDE 0.9 % IV SOLN
Freq: Once | INTRAVENOUS | Status: AC
  Administered 2017-10-16: 09:00:00 via INTRAVENOUS

## 2017-10-16 MED ORDER — SODIUM CHLORIDE 0.9 % IV SOLN
5.0000 mg/kg | INTRAVENOUS | Status: AC
  Administered 2017-10-16: 500 mg via INTRAVENOUS
  Filled 2017-10-16: qty 50

## 2017-10-29 DIAGNOSIS — L110 Acquired keratosis follicularis: Secondary | ICD-10-CM | POA: Diagnosis not present

## 2017-10-29 DIAGNOSIS — B351 Tinea unguium: Secondary | ICD-10-CM | POA: Diagnosis not present

## 2017-10-29 DIAGNOSIS — E1140 Type 2 diabetes mellitus with diabetic neuropathy, unspecified: Secondary | ICD-10-CM | POA: Diagnosis not present

## 2017-11-11 DIAGNOSIS — R0600 Dyspnea, unspecified: Secondary | ICD-10-CM | POA: Diagnosis not present

## 2017-11-11 DIAGNOSIS — Z6828 Body mass index (BMI) 28.0-28.9, adult: Secondary | ICD-10-CM | POA: Diagnosis not present

## 2017-11-11 DIAGNOSIS — R0602 Shortness of breath: Secondary | ICD-10-CM | POA: Diagnosis not present

## 2017-11-11 DIAGNOSIS — R072 Precordial pain: Secondary | ICD-10-CM | POA: Diagnosis not present

## 2017-11-16 DIAGNOSIS — N400 Enlarged prostate without lower urinary tract symptoms: Secondary | ICD-10-CM | POA: Diagnosis present

## 2017-11-16 DIAGNOSIS — J849 Interstitial pulmonary disease, unspecified: Secondary | ICD-10-CM | POA: Diagnosis present

## 2017-11-16 DIAGNOSIS — R531 Weakness: Secondary | ICD-10-CM | POA: Diagnosis present

## 2017-11-16 DIAGNOSIS — R0902 Hypoxemia: Secondary | ICD-10-CM | POA: Diagnosis present

## 2017-11-16 DIAGNOSIS — Z79899 Other long term (current) drug therapy: Secondary | ICD-10-CM | POA: Diagnosis not present

## 2017-11-16 DIAGNOSIS — G4733 Obstructive sleep apnea (adult) (pediatric): Secondary | ICD-10-CM | POA: Diagnosis present

## 2017-11-16 DIAGNOSIS — K5190 Ulcerative colitis, unspecified, without complications: Secondary | ICD-10-CM | POA: Diagnosis present

## 2017-11-16 DIAGNOSIS — Z87891 Personal history of nicotine dependence: Secondary | ICD-10-CM | POA: Diagnosis not present

## 2017-11-16 DIAGNOSIS — K219 Gastro-esophageal reflux disease without esophagitis: Secondary | ICD-10-CM | POA: Diagnosis present

## 2017-11-16 DIAGNOSIS — Z6826 Body mass index (BMI) 26.0-26.9, adult: Secondary | ICD-10-CM | POA: Diagnosis not present

## 2017-11-16 DIAGNOSIS — Z7982 Long term (current) use of aspirin: Secondary | ICD-10-CM | POA: Diagnosis not present

## 2017-11-16 DIAGNOSIS — I10 Essential (primary) hypertension: Secondary | ICD-10-CM | POA: Diagnosis present

## 2017-11-16 DIAGNOSIS — E871 Hypo-osmolality and hyponatremia: Secondary | ICD-10-CM | POA: Diagnosis present

## 2017-11-16 DIAGNOSIS — R0602 Shortness of breath: Secondary | ICD-10-CM | POA: Diagnosis present

## 2017-11-16 DIAGNOSIS — F411 Generalized anxiety disorder: Secondary | ICD-10-CM | POA: Diagnosis present

## 2017-11-16 DIAGNOSIS — E119 Type 2 diabetes mellitus without complications: Secondary | ICD-10-CM | POA: Diagnosis present

## 2017-11-16 DIAGNOSIS — R634 Abnormal weight loss: Secondary | ICD-10-CM | POA: Diagnosis present

## 2017-11-16 DIAGNOSIS — Z7984 Long term (current) use of oral hypoglycemic drugs: Secondary | ICD-10-CM | POA: Diagnosis not present

## 2017-11-16 DIAGNOSIS — G44229 Chronic tension-type headache, not intractable: Secondary | ICD-10-CM | POA: Diagnosis present

## 2017-11-17 DIAGNOSIS — R0602 Shortness of breath: Secondary | ICD-10-CM | POA: Diagnosis not present

## 2017-11-19 DIAGNOSIS — Z7984 Long term (current) use of oral hypoglycemic drugs: Secondary | ICD-10-CM | POA: Diagnosis not present

## 2017-11-19 DIAGNOSIS — I10 Essential (primary) hypertension: Secondary | ICD-10-CM | POA: Diagnosis not present

## 2017-11-19 DIAGNOSIS — N400 Enlarged prostate without lower urinary tract symptoms: Secondary | ICD-10-CM | POA: Diagnosis not present

## 2017-11-19 DIAGNOSIS — K5190 Ulcerative colitis, unspecified, without complications: Secondary | ICD-10-CM | POA: Diagnosis not present

## 2017-11-19 DIAGNOSIS — E119 Type 2 diabetes mellitus without complications: Secondary | ICD-10-CM | POA: Diagnosis not present

## 2017-11-19 DIAGNOSIS — K219 Gastro-esophageal reflux disease without esophagitis: Secondary | ICD-10-CM | POA: Diagnosis not present

## 2017-11-19 DIAGNOSIS — F419 Anxiety disorder, unspecified: Secondary | ICD-10-CM | POA: Diagnosis not present

## 2017-11-19 DIAGNOSIS — G4733 Obstructive sleep apnea (adult) (pediatric): Secondary | ICD-10-CM | POA: Diagnosis not present

## 2017-11-23 ENCOUNTER — Other Ambulatory Visit (INDEPENDENT_AMBULATORY_CARE_PROVIDER_SITE_OTHER): Payer: Medicare Other

## 2017-11-23 ENCOUNTER — Encounter: Payer: Self-pay | Admitting: Pulmonary Disease

## 2017-11-23 ENCOUNTER — Ambulatory Visit (INDEPENDENT_AMBULATORY_CARE_PROVIDER_SITE_OTHER): Payer: Medicare Other | Admitting: Pulmonary Disease

## 2017-11-23 VITALS — BP 138/70 | HR 86 | Ht 72.0 in | Wt 203.0 lb

## 2017-11-23 DIAGNOSIS — J849 Interstitial pulmonary disease, unspecified: Secondary | ICD-10-CM | POA: Diagnosis not present

## 2017-11-23 LAB — SEDIMENTATION RATE: Sed Rate: 21 mm/hr — ABNORMAL HIGH (ref 0–20)

## 2017-11-23 LAB — C-REACTIVE PROTEIN: CRP: 0.7 mg/dL (ref 0.5–20.0)

## 2017-11-23 MED ORDER — BUDESONIDE-FORMOTEROL FUMARATE 160-4.5 MCG/ACT IN AERO: 2.0000 | INHALATION_SPRAY | Freq: Two times a day (BID) | RESPIRATORY_TRACT | 6 refills | Status: DC

## 2017-11-23 MED ORDER — FLUTTER DEVI: 1.0000 | Freq: Once | 0 refills | Status: AC
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
## 2017-11-23 NOTE — Patient Instructions (Signed)
We will check blood work today including hypersensitivity pneumonitis panel, ANA with reflex, CCP, rheumatoid factor, sed rate, CRP, Sjogren's panel, angiotensin-converting enzyme We will schedule you for full pulmonary function test Start tapering prednisone by 10 mg every 2 weeks We will give you samples of Symbicort 160/4.5 and a flutter wall for clearance of secretion Follow-up in 2-4 weeks.

## 2017-11-24 LAB — ANGIOTENSIN CONVERTING ENZYME: ANGIOTENSIN-CONVERTING ENZYME: 62 U/L (ref 9–67)

## 2017-11-24 LAB — CYCLIC CITRUL PEPTIDE ANTIBODY, IGG: Cyclic Citrullin Peptide Ab: <16 UNITS

## 2017-11-24 LAB — SJOGRENS SYNDROME-B EXTRACTABLE NUCLEAR ANTIBODY: SSB (LA) (ENA) ANTIBODY, IGG: NEGATIVE AI

## 2017-11-24 LAB — RHEUMATOID FACTOR: Rhuematoid fact SerPl-aCnc: <14 IU/mL (ref <14)

## 2017-11-25 LAB — ANTINUCLEAR ANTIBODIES, IFA: ANA Titer 1: NEGATIVE

## 2017-11-26 ENCOUNTER — Ambulatory Visit (HOSPITAL_COMMUNITY)
Admission: RE | Admit: 2017-11-26 | Discharge: 2017-11-26 | Disposition: A | Discharge status: Home / Self Care | Payer: Medicare Other | Source: Ambulatory Visit | Attending: Pulmonary Disease | Admitting: Pulmonary Disease

## 2017-11-26 DIAGNOSIS — R1310 Dysphagia, unspecified: Secondary | ICD-10-CM | POA: Diagnosis not present

## 2017-11-26 DIAGNOSIS — J849 Interstitial pulmonary disease, unspecified: Secondary | ICD-10-CM | POA: Diagnosis not present

## 2017-11-26 DIAGNOSIS — K5190 Ulcerative colitis, unspecified, without complications: Secondary | ICD-10-CM | POA: Diagnosis not present

## 2017-11-26 DIAGNOSIS — I10 Essential (primary) hypertension: Secondary | ICD-10-CM | POA: Diagnosis not present

## 2017-11-26 DIAGNOSIS — E119 Type 2 diabetes mellitus without complications: Secondary | ICD-10-CM | POA: Diagnosis not present

## 2017-11-26 DIAGNOSIS — N400 Enlarged prostate without lower urinary tract symptoms: Secondary | ICD-10-CM | POA: Diagnosis not present

## 2017-11-26 DIAGNOSIS — G4733 Obstructive sleep apnea (adult) (pediatric): Secondary | ICD-10-CM | POA: Diagnosis not present

## 2017-11-26 DIAGNOSIS — F419 Anxiety disorder, unspecified: Secondary | ICD-10-CM | POA: Diagnosis not present

## 2017-12-01 DIAGNOSIS — F419 Anxiety disorder, unspecified: Secondary | ICD-10-CM | POA: Diagnosis not present

## 2017-12-01 DIAGNOSIS — I10 Essential (primary) hypertension: Secondary | ICD-10-CM | POA: Diagnosis not present

## 2017-12-01 DIAGNOSIS — J849 Interstitial pulmonary disease, unspecified: Secondary | ICD-10-CM

## 2017-12-01 DIAGNOSIS — E119 Type 2 diabetes mellitus without complications: Secondary | ICD-10-CM | POA: Diagnosis not present

## 2017-12-01 DIAGNOSIS — N400 Enlarged prostate without lower urinary tract symptoms: Secondary | ICD-10-CM | POA: Diagnosis not present

## 2017-12-01 DIAGNOSIS — G4733 Obstructive sleep apnea (adult) (pediatric): Secondary | ICD-10-CM | POA: Diagnosis not present

## 2017-12-01 DIAGNOSIS — K5190 Ulcerative colitis, unspecified, without complications: Secondary | ICD-10-CM | POA: Diagnosis not present

## 2017-12-01 HISTORY — DX: Interstitial pulmonary disease, unspecified: J84.9

## 2017-12-02 ENCOUNTER — Telehealth: Payer: Self-pay | Admitting: Pulmonary Disease

## 2017-12-02 NOTE — Telephone Encounter (Signed)
Called pt to go over how to clean the flutter valve per taking the mouth piece out and cleaning it with warm soapy water.  Pt stated to me he had pulled up videos that stated he needed to take the entire thing apart to be able to clean it.  I double-checked with Cherina who agreed that pt needed to only take mouth piece off to be able to clean it.  Pt stated he did not know if he was using it correctly due to at times not feeling the flutter in his chest.  I stated to pt if he needed to come by to have someone check to see if he was using it correctly, he could do so.  Pt expressed understanding.  Nothing further needed at this current time.

## 2017-12-07 ENCOUNTER — Encounter (INDEPENDENT_AMBULATORY_CARE_PROVIDER_SITE_OTHER): Payer: Self-pay | Admitting: Internal Medicine

## 2017-12-07 DIAGNOSIS — N400 Enlarged prostate without lower urinary tract symptoms: Secondary | ICD-10-CM | POA: Diagnosis not present

## 2017-12-07 DIAGNOSIS — K5190 Ulcerative colitis, unspecified, without complications: Secondary | ICD-10-CM | POA: Diagnosis not present

## 2017-12-07 DIAGNOSIS — I10 Essential (primary) hypertension: Secondary | ICD-10-CM | POA: Diagnosis not present

## 2017-12-07 DIAGNOSIS — G4733 Obstructive sleep apnea (adult) (pediatric): Secondary | ICD-10-CM | POA: Diagnosis not present

## 2017-12-07 DIAGNOSIS — F419 Anxiety disorder, unspecified: Secondary | ICD-10-CM | POA: Diagnosis not present

## 2017-12-07 DIAGNOSIS — E119 Type 2 diabetes mellitus without complications: Secondary | ICD-10-CM | POA: Diagnosis not present

## 2017-12-10 ENCOUNTER — Telehealth (INDEPENDENT_AMBULATORY_CARE_PROVIDER_SITE_OTHER): Payer: Self-pay | Admitting: *Deleted

## 2017-12-10 NOTE — Telephone Encounter (Signed)
Dr Ardis Hughs ordered a XR Esophagus exam which I believe you have a copy of the results. Dr Nyoka Cowden of [facility] did the test. Do we need to do anything further concerning this? I have been experiencing shortness of breath for nsometime. It got considerably worse about 3 weeks ago. Went to Dr Quillian Quince who subsequently entered me into hospital. They did more blood work and a CT scan which indicated that I have interstitial lung disease. Then to Dr Ardis Hughs, pulmonary specialist. See him again on Feb 13

## 2017-12-10 NOTE — Telephone Encounter (Signed)
Barium study reviewed. Patient has known esophageal stricture which was last dilated over 4 years ago. Since he is having difficulty with solids and pills it would be appropriate to arrange for EGD and dilation. Do not believe esophageal disease source of his pulmonary symptoms. Patient will need to be off aspirin for a couple of days.

## 2017-12-11 ENCOUNTER — Other Ambulatory Visit (INDEPENDENT_AMBULATORY_CARE_PROVIDER_SITE_OTHER): Payer: Self-pay | Admitting: *Deleted

## 2017-12-11 ENCOUNTER — Encounter (HOSPITAL_COMMUNITY)
Admission: RE | Admit: 2017-12-11 | Discharge: 2017-12-11 | Disposition: A | Discharge status: Home / Self Care | Payer: Medicare Other | Source: Ambulatory Visit | Attending: Internal Medicine | Admitting: Internal Medicine

## 2017-12-11 ENCOUNTER — Encounter (HOSPITAL_COMMUNITY): Payer: Self-pay

## 2017-12-11 DIAGNOSIS — K5190 Ulcerative colitis, unspecified, without complications: Secondary | ICD-10-CM | POA: Insufficient documentation

## 2017-12-11 DIAGNOSIS — K222 Esophageal obstruction: Secondary | ICD-10-CM | POA: Insufficient documentation

## 2017-12-11 HISTORY — DX: Dyspnea, unspecified: R06.00

## 2017-12-11 HISTORY — DX: Interstitial pulmonary disease, unspecified: J84.9

## 2017-12-11 MED ORDER — SODIUM CHLORIDE 0.9 % IV SOLN
Freq: Once | INTRAVENOUS | Status: AC
  Administered 2017-12-11: 08:00:00 via INTRAVENOUS

## 2017-12-11 MED ORDER — SODIUM CHLORIDE 0.9 % IV SOLN
5.0000 mg/kg | INTRAVENOUS | Status: DC
  Administered 2017-12-11: 500 mg via INTRAVENOUS
  Filled 2017-12-11: qty 50

## 2017-12-11 MED ORDER — DIPHENHYDRAMINE HCL 25 MG PO CAPS: 50.0000 mg | ORAL_CAPSULE | Freq: Once | ORAL | Status: DC

## 2017-12-11 MED ORDER — LORATADINE 10 MG PO TABS: 10.0000 mg | ORAL_TABLET | Freq: Once | ORAL | Status: DC

## 2017-12-11 MED ORDER — ACETAMINOPHEN 325 MG PO TABS: 650.0000 mg | ORAL_TABLET | Freq: Once | ORAL | Status: DC

## 2017-12-11 NOTE — Telephone Encounter (Signed)
EGD/ED sch'd 12/15/17 at 713 (630), verbal instructions given to patient

## 2017-12-12 ENCOUNTER — Other Ambulatory Visit (INDEPENDENT_AMBULATORY_CARE_PROVIDER_SITE_OTHER): Payer: Self-pay | Admitting: Internal Medicine

## 2017-12-15 ENCOUNTER — Encounter (HOSPITAL_COMMUNITY)
Admission: RE | Disposition: A | Discharge status: Home / Self Care | Payer: Self-pay | Source: Ambulatory Visit | Attending: Internal Medicine

## 2017-12-15 ENCOUNTER — Other Ambulatory Visit: Payer: Self-pay

## 2017-12-15 ENCOUNTER — Encounter (HOSPITAL_COMMUNITY): Payer: Self-pay | Admitting: *Deleted

## 2017-12-15 ENCOUNTER — Ambulatory Visit (HOSPITAL_COMMUNITY)
Admission: RE | Admit: 2017-12-15 | Discharge: 2017-12-15 | Disposition: A | Discharge status: Home / Self Care | Payer: Medicare Other | Source: Ambulatory Visit | Attending: Internal Medicine | Admitting: Internal Medicine

## 2017-12-15 DIAGNOSIS — I10 Essential (primary) hypertension: Secondary | ICD-10-CM | POA: Insufficient documentation

## 2017-12-15 DIAGNOSIS — K222 Esophageal obstruction: Secondary | ICD-10-CM

## 2017-12-15 DIAGNOSIS — J849 Interstitial pulmonary disease, unspecified: Secondary | ICD-10-CM | POA: Insufficient documentation

## 2017-12-15 DIAGNOSIS — K449 Diaphragmatic hernia without obstruction or gangrene: Secondary | ICD-10-CM | POA: Insufficient documentation

## 2017-12-15 DIAGNOSIS — E782 Mixed hyperlipidemia: Secondary | ICD-10-CM | POA: Diagnosis not present

## 2017-12-15 DIAGNOSIS — K766 Portal hypertension: Secondary | ICD-10-CM | POA: Insufficient documentation

## 2017-12-15 DIAGNOSIS — K3189 Other diseases of stomach and duodenum: Secondary | ICD-10-CM

## 2017-12-15 DIAGNOSIS — Z7982 Long term (current) use of aspirin: Secondary | ICD-10-CM | POA: Insufficient documentation

## 2017-12-15 DIAGNOSIS — Z79899 Other long term (current) drug therapy: Secondary | ICD-10-CM | POA: Diagnosis not present

## 2017-12-15 DIAGNOSIS — K219 Gastro-esophageal reflux disease without esophagitis: Secondary | ICD-10-CM | POA: Insufficient documentation

## 2017-12-15 DIAGNOSIS — G4733 Obstructive sleep apnea (adult) (pediatric): Secondary | ICD-10-CM | POA: Diagnosis not present

## 2017-12-15 DIAGNOSIS — K5190 Ulcerative colitis, unspecified, without complications: Secondary | ICD-10-CM | POA: Diagnosis not present

## 2017-12-15 DIAGNOSIS — Z87891 Personal history of nicotine dependence: Secondary | ICD-10-CM | POA: Diagnosis not present

## 2017-12-15 DIAGNOSIS — E119 Type 2 diabetes mellitus without complications: Secondary | ICD-10-CM | POA: Insufficient documentation

## 2017-12-15 DIAGNOSIS — Z7984 Long term (current) use of oral hypoglycemic drugs: Secondary | ICD-10-CM | POA: Diagnosis not present

## 2017-12-15 DIAGNOSIS — K31819 Angiodysplasia of stomach and duodenum without bleeding: Secondary | ICD-10-CM | POA: Insufficient documentation

## 2017-12-15 DIAGNOSIS — F419 Anxiety disorder, unspecified: Secondary | ICD-10-CM | POA: Diagnosis not present

## 2017-12-15 DIAGNOSIS — N400 Enlarged prostate without lower urinary tract symptoms: Secondary | ICD-10-CM | POA: Diagnosis not present

## 2017-12-15 DIAGNOSIS — K5100 Ulcerative (chronic) pancolitis without complications: Secondary | ICD-10-CM | POA: Diagnosis not present

## 2017-12-15 DIAGNOSIS — R1310 Dysphagia, unspecified: Secondary | ICD-10-CM | POA: Diagnosis present

## 2017-12-15 HISTORY — DX: Gastro-esophageal reflux disease without esophagitis: K21.9

## 2017-12-15 HISTORY — PX: ESOPHAGOGASTRODUODENOSCOPY: SHX5428

## 2017-12-15 HISTORY — PX: ESOPHAGEAL DILATION: SHX303

## 2017-12-15 LAB — GLUCOSE, CAPILLARY: Glucose-Capillary: 139 mg/dL — ABNORMAL HIGH (ref 65–99)

## 2017-12-15 SURGERY — EGD (ESOPHAGOGASTRODUODENOSCOPY)
Anesthesia: Moderate Sedation

## 2017-12-15 MED ORDER — SODIUM CHLORIDE 0.9 % IV SOLN
INTRAVENOUS | Status: DC
  Administered 2017-12-15: 07:00:00 via INTRAVENOUS

## 2017-12-15 MED ORDER — LIDOCAINE VISCOUS 2 % MT SOLN
OROMUCOSAL | Status: AC
  Filled 2017-12-15: qty 15

## 2017-12-15 MED ORDER — LIDOCAINE VISCOUS 2 % MT SOLN
OROMUCOSAL | Status: DC | PRN
  Administered 2017-12-15: 1 via OROMUCOSAL

## 2017-12-15 MED ORDER — MIDAZOLAM HCL 5 MG/5ML IJ SOLN
INTRAMUSCULAR | Status: DC | PRN
Start: 2017-12-15 — End: 2017-12-15
  Administered 2017-12-15: 1 mg via INTRAVENOUS
  Administered 2017-12-15: 2 mg via INTRAVENOUS
  Administered 2017-12-15: 1 mg via INTRAVENOUS

## 2017-12-15 MED ORDER — MEPERIDINE HCL 50 MG/ML IJ SOLN
INTRAMUSCULAR | Status: AC
  Filled 2017-12-15: qty 1

## 2017-12-15 MED ORDER — MEPERIDINE HCL 50 MG/ML IJ SOLN
INTRAMUSCULAR | Status: DC | PRN
  Administered 2017-12-15: 25 mg via INTRAVENOUS
  Administered 2017-12-15: 15 mg via INTRAVENOUS

## 2017-12-15 MED ORDER — STERILE WATER FOR IRRIGATION IR SOLN
Status: DC | PRN
  Administered 2017-12-15: 07:00:00

## 2017-12-15 MED ORDER — MIDAZOLAM HCL 5 MG/5ML IJ SOLN
INTRAMUSCULAR | Status: AC
  Filled 2017-12-15: qty 10
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
## 2017-12-15 NOTE — Discharge Instructions (Signed)
°  Esophagogastroduodenoscopy, Care After Refer to this sheet in the next few weeks. These instructions provide you with information about caring for yourself after your procedure. Your health care provider may also give you more specific instructions. Your treatment has been planned according to current medical practices, but problems sometimes occur. Call your health care provider if you have any problems or questions after your procedure. What can I expect after the procedure? After the procedure, it is common to have:  A sore throat.  Nausea.  Bloating.  Dizziness.  Fatigue.  Follow these instructions at home:  Do not eat or drink anything until the numbing medicine (local anesthetic) has worn off and your gag reflex has returned. You will know that the local anesthetic has worn off when you can swallow comfortably.  Do not drive for 24 hours if you received a medicine to help you relax (sedative).  If your health care provider took a tissue sample for testing during the procedure, make sure to get your test results. This is your responsibility. Ask your health care provider or the department performing the test when your results will be ready.  Keep all follow-up visits as told by your health care provider. This is important. Contact a health care provider if:  You cannot stop coughing.  You are not urinating.  You are urinating less than usual. Get help right away if:  You have trouble swallowing.  You cannot eat or drink.  You have throat or chest pain that gets worse.  You are dizzy or light-headed.  You faint.  You have nausea or vomiting.  You have chills.  You have a fever.  You have severe abdominal pain.  You have black, tarry, or bloody stools. This information is not intended to replace advice given to you by your health care provider. Make sure you discuss any questions you have with your health care provider. Document Released: 10/13/2012 Document  Revised: 04/03/2016 Document Reviewed: 09/20/2015 Elsevier Interactive Patient Education  2018 [place] usual medications including aspirin as before. Resume usual diet. No driving for 24 hours. Please call office with progress report in 1 week.

## 2017-12-15 NOTE — Op Note (Signed)
[facility] Patient Name: Manuel Atkins Procedure Date: 12/15/2017 6:57 AM MRN: [id number] Date of Birth: 12-25-1932 Attending MD: Hildred Laser , MD CSN: [id number] Age: 84 Admit Type: Outpatient Procedure:                Upper GI endoscopy Indications:              Esophageal dysphagia, For therapy of esophageal                            stenosis Providers:                Hildred Laser, MD, Janeece Riggers, RN, Randa Spike,                            Technician Referring MD:             Mitzie Na. Quillian Quince, MD Medicines:                Lidocaine spray, Meperidine 40 mg IV, Midazolam 4                            mg IV Complications:            No immediate complications. Estimated Blood Loss:     Estimated blood loss: none. Procedure:                Pre-Anesthesia Assessment:                           - Prior to the procedure, a History and Physical                            was performed, and patient medications and                            allergies were reviewed. The patient's tolerance of                            previous anesthesia was also reviewed. The risks                            and benefits of the procedure and the sedation                            options and risks were discussed with the patient.                            All questions were answered, and informed consent                            was obtained. Prior Anticoagulants: The patient                            last took aspirin 1 day prior to the procedure. ASA  Grade Assessment: III - A patient with severe                            systemic disease. After reviewing the risks and                            benefits, the patient was deemed in satisfactory                            condition to undergo the procedure.                           After obtaining informed consent, the endoscope was                            passed under direct vision. Throughout the                 procedure, the patient's blood pressure, pulse, and                            oxygen saturations were monitored continuously. The                            EG-[id number] ([id number]) scope was introduced through the                            mouth, and advanced to the second part of duodenum.                            The upper GI endoscopy was accomplished without                            difficulty. The patient tolerated the procedure                            well. Scope In: 7:30:01 AM Scope Out: 7:40:44 AM Total Procedure Duration: 0 hours 10 minutes 43 seconds  Findings:      The examined esophagus was normal.      One mild stenosis was found 37 cm (at GEJ) from the incisors. This       measured less than one cm (in length) and was traversed. A TTS dilator       was passed through the scope. Dilation with a 15-16.5-18 mm x 8 cm CRE       balloon dilator was performed to 15 mm, 16.5 mm and 18 mm. The dilation       site was examined and showed no change and no bleeding, mucosal tear or       perforation.      A 2 cm hiatal hernia was present.      Mild portal hypertensive gastropathy was found in the gastric fundus.      Mild gastric antral vascular ectasia was present in the gastric antrum       and in the prepyloric region of the stomach.      The exam of the stomach was otherwise normal.  The duodenal bulb and second portion of the duodenum were normal. Impression:               - Normal esophagus.                           - Esophageal stenosis. Dilated.                           - 2 cm hiatal hernia.                           - Portal hypertensive gastropathy.                           - Gastric antral vascular ectasia.                           - Normal duodenal bulb and second portion of the                            duodenum.                           - No specimens collected. Moderate Sedation:      Moderate (conscious) sedation was administered by the  endoscopy nurse       and supervised by the endoscopist. The following parameters were       monitored: oxygen saturation, heart rate, blood pressure, CO2       capnography and response to care. Total physician intraservice time was       15 minutes. Recommendation:           - Patient has a contact number available for                            emergencies. The signs and symptoms of potential                            delayed complications were discussed with the                            patient. Return to normal activities tomorrow.                            Written discharge instructions were provided to the                            patient.                           - Resume previous diet today.                           - Continue present medications.                           - Resume aspirin at prior dose today.                           -  Telephone GI clinic in 1 week. Procedure Code(s):        --- Professional ---                           [phone], Esophagogastroduodenoscopy, flexible,                            transoral; with transendoscopic balloon dilation of                            esophagus (less than 30 mm diameter)                           99152, Moderate sedation services provided by the                            same physician or other qualified health care                            professional performing the diagnostic or                            therapeutic service that the sedation supports,                            requiring the presence of an independent trained                            observer to assist in the monitoring of the                            patient's level of consciousness and physiological                            status; initial 15 minutes of intraservice time,                            patient age 29 years or older Diagnosis Code(s):        --- Professional ---                           K22.2, Esophageal obstruction                            K44.9, Diaphragmatic hernia without obstruction or                            gangrene                           K76.6, Portal hypertension                           K31.89, Other diseases of stomach and duodenum  K31.819, Angiodysplasia of stomach and duodenum                            without bleeding                           R13.14, Dysphagia, pharyngoesophageal phase CPT copyright 2016 American Medical Association. All rights reserved. The codes documented in this report are preliminary and upon coder review may  be revised to meet current compliance requirements. Hildred Laser, MD Hildred Laser, MD 12/15/2017 7:51:51 AM This report has been signed electronically. Number of Addenda: 0

## 2017-12-17 ENCOUNTER — Encounter (HOSPITAL_COMMUNITY): Payer: Self-pay | Admitting: Internal Medicine

## 2017-12-18 ENCOUNTER — Encounter (HOSPITAL_COMMUNITY): Payer: Medicare Other

## 2017-12-18 DIAGNOSIS — J849 Interstitial pulmonary disease, unspecified: Secondary | ICD-10-CM | POA: Diagnosis not present

## 2017-12-18 DIAGNOSIS — Z6828 Body mass index (BMI) 28.0-28.9, adult: Secondary | ICD-10-CM | POA: Diagnosis not present

## 2017-12-23 ENCOUNTER — Encounter: Payer: Self-pay | Admitting: Pulmonary Disease

## 2017-12-23 ENCOUNTER — Other Ambulatory Visit: Payer: Medicare Other

## 2017-12-23 ENCOUNTER — Ambulatory Visit (INDEPENDENT_AMBULATORY_CARE_PROVIDER_SITE_OTHER): Payer: Medicare Other | Admitting: Pulmonary Disease

## 2017-12-23 ENCOUNTER — Telehealth: Payer: Self-pay

## 2017-12-23 VITALS — BP 122/82 | HR 98 | Ht 71.5 in | Wt 205.0 lb

## 2017-12-23 DIAGNOSIS — J849 Interstitial pulmonary disease, unspecified: Secondary | ICD-10-CM

## 2017-12-23 LAB — PULMONARY FUNCTION TEST
DL/VA % PRED: 75 %
DL/VA: 3.5 ml/min/mmHg/L
DLCO COR: 14.77 ml/min/mmHg
DLCO cor % pred: 42 %
DLCO unc % pred: 43 %
DLCO unc: 14.81 ml/min/mmHg
FEF 25-75 Post: 2.3 L/sec
FEF 25-75 Pre: 2.43 L/sec
FEF2575-%CHANGE-POST: -5 %
FEF2575-%Pred-Post: 115 %
FEF2575-%Pred-Pre: 122 %
FEV1-%CHANGE-POST: -2 %
FEV1-%Pred-Post: 66 %
FEV1-%Pred-Pre: 67 %
FEV1-POST: 1.97 L
FEV1-Pre: 2.01 L
FEV1FVC-%Change-Post: 3 %
FEV1FVC-%Pred-Pre: 116 %
FEV6-%Change-Post: -6 %
FEV6-%PRED-PRE: 62 %
FEV6-%Pred-Post: 58 %
FEV6-POST: 2.27 L
FEV6-PRE: 2.44 L
FEV6FVC-%Change-Post: 0 %
FEV6FVC-%PRED-PRE: 107 %
FEV6FVC-%Pred-Post: 106 %
FVC-%CHANGE-POST: -5 %
FVC-%PRED-POST: 54 %
FVC-%PRED-PRE: 58 %
FVC-POST: 2.3 L
FVC-PRE: 2.44 L
POST FEV6/FVC RATIO: 99 %
PRE FEV6/FVC RATIO: 100 %
Post FEV1/FVC ratio: 86 %
Pre FEV1/FVC ratio: 82 %
RV % pred: 75 %
RV: 2.1 L
TLC % PRED: 64 %
TLC: 4.77 L

## 2017-12-23 MED ORDER — BUDESONIDE-FORMOTEROL FUMARATE 160-4.5 MCG/ACT IN AERO: 2.0000 | INHALATION_SPRAY | Freq: Two times a day (BID) | RESPIRATORY_TRACT | 0 refills | Status: DC

## 2017-12-23 MED ORDER — BUDESONIDE-FORMOTEROL FUMARATE 160-4.5 MCG/ACT IN AERO: 2.0000 | INHALATION_SPRAY | Freq: Two times a day (BID) | RESPIRATORY_TRACT | 5 refills | Status: DC

## 2017-12-23 NOTE — Patient Instructions (Addendum)
We will reorder some of the blood tested on this test today including Sjogren's panel, hypersensitivity pneumonitis panel, myositis panel, angiotensin-converting enzyme, blood allergy profile We will give you samples of Symbicort and a prescription Continue using the flutter valve. I will try to get results of the echocardiogram from your primary care physician. Schedule you for a 6-minute walk test   Please start working on an exercise regimen, and follow-up in 2 months ILD clinic.
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
## 2017-12-23 NOTE — Telephone Encounter (Signed)
Medical release form has been faxed to Dr. Arcola Jansky office requesting echo results.  Will await records.

## 2017-12-23 NOTE — Progress Notes (Signed)
PFT done today. 

## 2017-12-24 ENCOUNTER — Encounter: Payer: Self-pay | Admitting: Pulmonary Disease

## 2017-12-24 LAB — RESPIRATORY ALLERGY PROFILE REGION II ~~LOC~~
Allergen, A. alternata, m6: <0.1 kU/L
Allergen, Cedar tree, t12: <0.1 kU/L
Allergen, Comm Silver Birch, t9: <0.1 kU/L
Allergen, Cottonwood, t14: <0.1 kU/L
Allergen, Oak,t7: <0.1 kU/L
Bermuda Grass: <0.1 kU/L
CLADOSPORIUM HERBARUM (M2) IGE: <0.1 kU/L
CLASS: 0
CLASS: 0
CLASS: 0
CLASS: 0
CLASS: 0
CLASS: 0
COMMON RAGWEED (SHORT) (W1) IGE: <0.1 kU/L
Cat Dander: <0.1 kU/L
Class: 0
Class: 0
Class: 0
Class: 0
Class: 0
Class: 0
Class: 0
Class: 0
Class: 0
Class: 0
Class: 0
Class: 0
Class: 0
Class: 0
Class: 0
Class: 0
Class: 0
Class: 0
Cockroach: <0.1 kU/L
Dog Dander: <0.1 kU/L
Elm IgE: <0.1 kU/L
IgE (Immunoglobulin E), Serum: 9 kU/L (ref <=114)
Johnson Grass: <0.1 kU/L
Pecan/Hickory Tree IgE: <0.1 kU/L

## 2017-12-24 LAB — SJOGREN'S SYNDROME ANTIBODS(SSA + SSB)
SSA (Ro) (ENA) Antibody, IgG: <1 AI
SSB (LA) (ENA) ANTIBODY, IGG: NEGATIVE AI

## 2017-12-24 LAB — ANGIOTENSIN CONVERTING ENZYME: Angiotensin-Converting Enzyme: 49 U/L (ref 9–67)

## 2017-12-24 LAB — INTERPRETATION:

## 2017-12-29 DIAGNOSIS — F419 Anxiety disorder, unspecified: Secondary | ICD-10-CM | POA: Diagnosis not present

## 2017-12-29 DIAGNOSIS — K5190 Ulcerative colitis, unspecified, without complications: Secondary | ICD-10-CM | POA: Diagnosis not present

## 2017-12-29 DIAGNOSIS — N400 Enlarged prostate without lower urinary tract symptoms: Secondary | ICD-10-CM | POA: Diagnosis not present

## 2017-12-29 DIAGNOSIS — K219 Gastro-esophageal reflux disease without esophagitis: Secondary | ICD-10-CM | POA: Diagnosis not present

## 2017-12-29 DIAGNOSIS — E119 Type 2 diabetes mellitus without complications: Secondary | ICD-10-CM | POA: Diagnosis not present

## 2017-12-29 DIAGNOSIS — G4733 Obstructive sleep apnea (adult) (pediatric): Secondary | ICD-10-CM | POA: Diagnosis not present

## 2017-12-29 DIAGNOSIS — I10 Essential (primary) hypertension: Secondary | ICD-10-CM | POA: Diagnosis not present

## 2017-12-29 DIAGNOSIS — Z7984 Long term (current) use of oral hypoglycemic drugs: Secondary | ICD-10-CM | POA: Diagnosis not present

## 2017-12-29 LAB — HYPERSENSITIVITY PNEUMONITIS
A. PULLULANS ABS: NEGATIVE
A.Fumigatus #1 Abs: NEGATIVE
Micropolyspora faeni, IgG: NEGATIVE
PIGEON SERUM ABS: NEGATIVE
THERMOACT. SACCHARII: NEGATIVE
Thermoactinomyces vulgaris, IgG: NEGATIVE

## 2017-12-29 LAB — MYOSITIS PANEL III: RNP: 2.1 EU/ml

## 2018-01-04 NOTE — Telephone Encounter (Signed)
Records have been received and placed in Dr. Princella Ion for review.  Nothing further is needed.

## 2018-01-05 ENCOUNTER — Ambulatory Visit (INDEPENDENT_AMBULATORY_CARE_PROVIDER_SITE_OTHER): Payer: Medicare Other | Admitting: Internal Medicine

## 2018-01-05 ENCOUNTER — Encounter (INDEPENDENT_AMBULATORY_CARE_PROVIDER_SITE_OTHER): Payer: Self-pay | Admitting: Internal Medicine

## 2018-01-05 VITALS — BP 150/78 | Wt 206.0 lb

## 2018-01-05 DIAGNOSIS — K2100 Gastro-esophageal reflux disease with esophagitis, without bleeding: Secondary | ICD-10-CM

## 2018-01-05 DIAGNOSIS — K5100 Ulcerative (chronic) pancolitis without complications: Secondary | ICD-10-CM

## 2018-01-05 NOTE — Progress Notes (Signed)
Presenting complaint;  Follow-up for ulcerative colitis. Recent EGD.  Subjective:  Patient is 84 year old Caucasian male with history of ulcerative colitis chronic GERD complicated by esophageal stricture who is here for scheduled visit.  He underwent EGD with dilation on 12/15/2017.  He states he has difficulty swallowing large pills.  He is not having any difficulty with solids or liquids.  He experiences heartburn occasionally.  He is on pantoprazole every other day.  He denies abdominal pain melena or rectal bleeding.  His bowels generally move daily. He continues to complain of dyspnea with minimal exertion. He has been evaluated by Dr. Vaughan Browner of [place] of pulmonary service and is scheduled to undergo further testing near future. He does not report any improvement in dyspnea since esophageal dilation.  He he denies cough chest pain fever chills or night sweats.   Current Medications: Outpatient Encounter Medications as of 01/05/2018  Medication Sig  . ALPRAZolam (XANAX) 0.25 MG tablet Take 1 tablet (0.25 mg total) by mouth 3 (three) times daily as needed. for anxiety  . aspirin EC 81 MG tablet Take 81 mg by mouth daily.  . budesonide-formoterol (SYMBICORT) 160-4.5 MCG/ACT inhaler Inhale 2 puffs into the lungs 2 (two) times daily.  . clobetasol cream (TEMOVATE) 5.17 % 1 application 2 (two) times daily as needed.   Marland Kitchen imipramine (TOFRANIL) 25 MG tablet TAKE  (3) TABLETS BY MOUTH AT BEDTIME; TITRATE AS DIRECTED  . ketoconazole (NIZORAL) 2 % cream Apply 1 application topically 2 (two) times daily as needed for irritation.   Marland Kitchen latanoprost (XALATAN) 0.005 % ophthalmic solution Place 1 drop into both eyes at bedtime.  Marland Kitchen LIALDA 1.2 g EC tablet TAKE TWO (2) TABLETS BY MOUTH TWICE DAILY  . losartan (COZAAR) 100 MG tablet Take 100 mg by mouth daily.  . metFORMIN (GLUCOPHAGE) 500 MG tablet Take 1,000 mg by mouth every evening.   . pantoprazole (PROTONIX) 40 MG tablet TAKE ONE TABLET BY MOUTH EVERY  *OTHER* DAY  . Probiotic Product (PROBIOTIC DAILY PO) Take 1 capsule by mouth daily.   . sodium chloride 0.9 % SOLN with inFLIXimab 100 MG SOLR Inject into the vein. Last infusion was 07/08/13.  . budesonide-formoterol (SYMBICORT) 160-4.5 MCG/ACT inhaler Inhale 2 puffs into the lungs 2 (two) times daily for 1 day.  . triamcinolone cream (KENALOG) 0.1 % Apply 1 application topically as needed (irritation).    No facility-administered encounter medications on file as of 01/05/2018.      Objective: Blood pressure (!) 150/78, weight 206 lb (93.4 kg). Patient is alert and in no acute distress. Conjunctiva is pink. Sclera is nonicteric Oropharyngeal mucosa is normal. No neck masses or thyromegaly noted. Cardiac exam with regular rhythm normal S1 and S2. No murmur or gallop noted. Auscultation of lungs reveal vesicular breath sounds without rales or rhonchi. Abdomen is full but soft and nontender without organomegaly or masses. No LE edema or clubbing noted.  Labs/studies Results: Chest CT report from 11/16/2017 reviewed. Impression as follows. Progress bronchiectasis and probable underlying chronic interstitial lung disease since 2009.  No acute process identified. Borderline to mild right hilar adenopathy since 2009 possibly postinflammatory. Calcified coronary artery atherosclerosis. Nodular contour to the liver raising the possibility of cirrhosis although no splenomegaly or other stigmata of portal venous hypertension seen in upper abdomen.      Assessment:  #1.  Chronic ulcerative colitis.  He appears to be in remission.  He is on infliximab for maintenance along with oral mesalamine. 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  #2.  Chronic GERD complicated by distal esophageal stricture which was dilated 3 weeks ago.  Esophagitis well controlled with PPI every other day.  #3.  History of fatty liver.   Viral markers for hepatitis B and C have been negative.  Recent chest CT raises the possibility of cirrhosis.  He does not have stigmata of portal hypertension on CT and recent EGD did not show varices or other stigmata.  He may well have early cirrhosis secondary to fatty liver but he has well preserved hepatic function.  #4.  Exertional dyspnea with minimal exertion.  Recent chest CT revealed progressive bronchiectasis and raised other possibilities.  Patient has been on infliximab for several years and at risk for atypical infections.  Other possibilities would include hypersensitivity lung disease.  Pulmonary evaluation is underway and would wait for completion of studies.  Since patient has been in remission for at least 5 years it would be reasonable to discontinue infliximab and leaving him on oral mesalamine.  If oral mesalamine is thought to be the culprit which is very unlikely one could stop oral mesalamine and continue infliximab.   Plan:  Patient will continue infliximab and oral mesalamine at current dose. He will also continue pantoprazole at a dose of 40 mg every other day. Will discuss patient's condition when pulmonary evaluation completed by Dr. Vaughan Browner. Patient is also due for surveillance colonoscopy will be delayed until pulmonary disorder sorted out. Office visit in 6 months.

## 2018-01-05 NOTE — Patient Instructions (Signed)
Notify if you have rectal bleeding

## 2018-01-07 DIAGNOSIS — E1140 Type 2 diabetes mellitus with diabetic neuropathy, unspecified: Secondary | ICD-10-CM | POA: Diagnosis not present

## 2018-01-07 DIAGNOSIS — B351 Tinea unguium: Secondary | ICD-10-CM | POA: Diagnosis not present

## 2018-01-07 DIAGNOSIS — L110 Acquired keratosis follicularis: Secondary | ICD-10-CM | POA: Diagnosis not present

## 2018-01-15 ENCOUNTER — Encounter: Payer: Self-pay | Admitting: Adult Health

## 2018-01-15 ENCOUNTER — Ambulatory Visit (INDEPENDENT_AMBULATORY_CARE_PROVIDER_SITE_OTHER): Payer: Medicare Other | Admitting: Adult Health

## 2018-01-15 VITALS — BP 132/82 | HR 91 | Ht 71.5 in | Wt 208.0 lb

## 2018-01-15 DIAGNOSIS — R490 Dysphonia: Secondary | ICD-10-CM | POA: Diagnosis not present

## 2018-01-15 DIAGNOSIS — J849 Interstitial pulmonary disease, unspecified: Secondary | ICD-10-CM

## 2018-01-15 DIAGNOSIS — R0609 Other forms of dyspnea: Secondary | ICD-10-CM

## 2018-01-15 DIAGNOSIS — R0600 Dyspnea, unspecified: Secondary | ICD-10-CM | POA: Insufficient documentation

## 2018-01-15 NOTE — Assessment & Plan Note (Signed)
Mild ILD Changes with Bronchiectasis slightly progressive since 2009 on CT chest - he has moderate Restriction w/ diffucing defect -suspect this along with severe deconditioning are contributing to his progressive DOE.  He has not desaturations with rest or with ambulation so does not need oxygen  In terms of ILD , workup is neg for autoimmune/CTD other than known underlying IBD with ulcerative colitis .  It is possible this and /or Remicade could be contributing to this as well.  For now would get HRCT chest -supine and prone .-looking for Atypical infection , Pneumonitis   If suspcious for possible atypical infections would consider FOB w/ BAL  If neg for atypical infections then can consider steroid challenge and /or d/c of Remicade   Plan  Patient Instructions  Refer to ENT to chronic hoarseness  May stop Symbicort  Refer to pulmonary rehab  Will be back in touch regarding Remicade.  Follow up with Dr. Vaughan Browner in 1 month and As needed   Delsym 2 tsp Twice daily  As needed  Cough .  Please contact office for sooner follow up if symptoms do not improve or worsen or seek emergency care

## 2018-01-15 NOTE — Assessment & Plan Note (Signed)
Progressive DOE suspect is multifactoral with suspected ILD changed , restrictive lung dz, and deconditioning  Ongoing workup with for ILD .  No desaturations with activity , Pulmonary artery normal on recent echo and no evidence of right heart strain, recent CT w/ contrast w/ no evidence of PE , so low suspicion of PE .  Would recommend referral for Pulmonary Reab.  Cont w/up for ILD

## 2018-01-15 NOTE — Addendum Note (Signed)
Addended by: Parke Poisson E on: 01/15/2018 04:29 PM   Modules accepted: Orders

## 2018-01-15 NOTE — Assessment & Plan Note (Signed)
Chronic voice changes Manuel Atkins /voice fatigue for several months -progressive decline ? Etiology  No stridor on exam, no evidence of upper airway obstruction on PFT -flow volume loop.  No perceived benefit with symbicort and could be irritation airway , d/c for now .   Refer to ENT .

## 2018-01-15 NOTE — Progress Notes (Signed)
_0  ID: Manuel Atkins, male    DOB: 08-Nov-1933, 84 y.o.   MRN: [id number]  Chief Complaint  Patient presents with  . Acute Visit    ILD     Referring provider: Caryl Bis, MD  HPI: 84 year old male former smoker (cigs/pipe and cigars) seen for pulmonary consult for progressive shortness of breath January 2019 found to have of bronchiectasis and probable ILD (dating back to 2009) Patient has obstructive sleep apnea on BiPAP at bedtime  Data Reviewed: CT scan 07/27/2008-mild basal atelectasis right upper lobe pulmonary embolism CT scan 11/16/17-mild bronchiectasis, basal reticulation right greater than left.  Borderline right hilar lymphadenopathy.  Nodular liver contour possible cirrhosis Progressive since 2009   Barium swallow 05/31/13- mild impairment of esophageal motility, stricture at GE junction with obstruction of barium tablet.  Labs Connective tissue serologies 11/23/17-ANA, ACE, CCP, rheumatoid factor all negative, HSP Neg , SSA neg . IgE 9, neg RAST  ESR 21   PFTs 12/23/17 FVC 2.30 (54%), FEV1 1.97 [6 6%], F/F 86, TLC 64%, DLCO 42% No obstruction, moderate restriction with severe diffusion defect Flow volume loop ok   BiPAP download 11/13/17-12/22/17 BiPAP settings 17/13 Usage days greater than 8 hours - 97% AHI 0.7, central events 0.3, minimal leaks.  Echocardiogram from primary care 11/13/17 Mild to moderate concentric LVH, normal LV systolic function.  LVEF 63-87%, grade 1 diastolic dysfunction RV cavity is normal size and function, mild AR, trace pulmonic regurgitation Main pulmonary artery appears normal.  01/15/2018 Acute OV : Dyspnea /ILD/Bronchiectasis  Pt returns for persistent dyspnea , mainly with activity . Gets winded with any activity .  Has been seen last couple of months to establish for progressive DOE for last 6 months , has been going on for few years but progressively declining . Has dry cough . No fever, joint swelling, rash , hemoptysis .  Chest CT shows progressive changes 2009 with mild bilateral bronchiectasis, bilateral subpleural reticular opacity more extensive in the left lung and small chronic calcified pleural plaques and small subpleural nodularity.  Patient is felt to have bronchiectasis and probable ILD.  Patient had extensive lab workup that has been unrevealing for autoimmune or connective tissue disorder Patient does have underlying ulcerative colitis and is on Lialda and Remicade infusions.   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  He has no birds chickens.  No known use of amiodarone, mtx  or Macrodantin.  Patient does have underlying GERD and is followed by GI with a recent endoscopy with esophageal dilatation for dysphagia.  Denies overt reflux She does complain of chronic voice weakness and hoarseness.  This seems to be worse over the last month.  Patient was started on Symbicort last month but has not seen any improvement in his breathing with this.  PFT showed moderate restrictive lung disease with a severe diffusing defect. Flow volume loops viewed with no evidence for extrathoracic issues.  He did smoke cigarettes cigars and pipe in his past but has not smoked for many years. Patient is very deconditioned uses a cane to walk.  Says over the last few years his activity level has decreased and his exercise tolerance is down.   Echo done earlier this year showed preserved EF with grade 1 diastolic dysfunction and pulmonary artery appeared normal.. Walk test today with O2 sats 99-100% on Room air .    Allergies  Allergen Reactions  . Mercaptopurine     High Fever, Chills, Fatigue  Immunization History  Administered Date(s) Administered  . Influenza-Unspecified 10/09/2017  . PPD Test 11/01/2012  . Pneumococcal Polysaccharide-23 11/11/2007  . Tdap 11/11/2007    Past Medical History:  Diagnosis Date  . 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 Dyspnea   . Essential  hypertension, benign   . GERD (gastroesophageal reflux disease)   . Headache(784.0)   . History of colon polyps   . History of transfusion of whole blood   . Interstitial lung disease ([place]) 12/01/2017  . Mixed hyperlipidemia   . Obstructive sleep apnea (adult) (pediatric)    uses bipap @ HS  . Other malaise and fatigue   . Thrombocytopenia ([place]) 11/10/2012  . Type II or unspecified type diabetes mellitus without mention of complication, uncontrolled   . 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[facility])     Tobacco History: Social History   Tobacco Use  Smoking Status Former Smoker  . Years: 20.00  . Types: Pipe, Cigars  . Last attempt to quit: 11/10/1968  . Years since quitting: 49.2  Smokeless Tobacco Never Used   Counseling given: Not Answered   Outpatient Encounter Medications as of 01/15/2018  Medication Sig  . ALPRAZolam (XANAX) 0.25 MG tablet Take 1 tablet (0.25 mg total) by mouth 3 (three) times daily as needed. for anxiety  . aspirin EC 81 MG tablet Take 81 mg by mouth daily.  . budesonide-formoterol (SYMBICORT) 160-4.5 MCG/ACT inhaler Inhale 2 puffs into the lungs 2 (two) times daily.  . clobetasol cream (TEMOVATE) 1.95 % 1 application 2 (two) times daily as needed.   Marland Kitchen imipramine (TOFRANIL) 25 MG tablet TAKE  (3) TABLETS BY MOUTH AT BEDTIME; TITRATE AS DIRECTED  . ketoconazole (NIZORAL) 2 % cream Apply 1 application topically 2 (two) times daily as needed for irritation.   Marland Kitchen latanoprost (XALATAN) 0.005 % ophthalmic solution Place 1 drop into both eyes at bedtime.  Marland Kitchen LIALDA 1.2 g EC tablet TAKE TWO (2) TABLETS BY MOUTH TWICE DAILY  . losartan (COZAAR) 100 MG tablet Take 100 mg by mouth daily.  . metFORMIN (GLUCOPHAGE) 500 MG tablet Take 1,000 mg by mouth every evening.   . pantoprazole (PROTONIX) 40 MG tablet TAKE ONE TABLET BY MOUTH EVERY *OTHER* DAY  . Probiotic Product (PROBIOTIC DAILY PO) Take 1 capsule by mouth daily.   . sodium chloride 0.9 % SOLN with inFLIXimab 100 MG  SOLR Inject into the vein. Last infusion was 07/08/13.  Marland Kitchen triamcinolone cream (KENALOG) 0.1 % Apply 1 application topically as needed (irritation).    No facility-administered encounter medications on file as of 01/15/2018.      Review of Systems  Constitutional:   No  weight loss, night sweats,  Fevers, chills,  +fatigue, or  lassitude.  HEENT:   No headaches,  Difficulty swallowing,  Tooth/dental problems, or  Sore throat,                No sneezing, itching, ear ache, nasal congestion, post nasal drip,   CV:  No chest pain,  Orthopnea, PND, swelling in lower extremities, anasarca, dizziness, palpitations, syncope.   GI  No heartburn, indigestion, abdominal pain, nausea, vomiting, diarrhea, change in bowel habits, loss of appetite, bloody stools.   Resp:   No chest wall deformity  Skin: no rash or lesions.  GU: no dysuria, change in color of urine, no urgency or frequency.  No flank pain, no hematuria   MS:  No joint pain or swelling.  No decreased range of motion.  No back pain.  Physical Exam  BP 132/82 (BP Location: Left Arm, Cuff Size: Normal)   Pulse 91   Ht 5' 11.5" (1.816 m)   Wt 208 lb (94.3 kg)   SpO2 100%   BMI 28.61 kg/m   GEN: A/Ox3; pleasant , NAD, elderly , cane    HEENT:  [place]/AT,  EACs-clear, TMs-wnl, NOSE-clear, THROAT-clear, no lesions, no postnasal drip or exudate noted.   NECK:  Supple w/ fair ROM; no JVD; normal carotid impulses w/o bruits; no thyromegaly or nodules palpated; no lymphadenopathy.    RESP  BB crackles , no accessory muscle use, no dullness to percussion  CARD:  RRR, no m/r/g, no peripheral edema, pulses intact, no cyanosis or clubbing.  GI:   Soft & nt; nml bowel sounds; no organomegaly or masses detected.   Musco: Warm bil, no deformities or joint swelling noted.   Neuro: alert, no focal deficits noted.    Skin: Warm, no lesions or rashes    Lab Results:  CBC  BMET  BNP No results found for: BNP  ProBNP No  results found for: PROBNP  Imaging: No results found.   Assessment & Plan:   No problem-specific Assessment & Plan notes found for this encounter.     Rexene Edison, NP 01/15/2018

## 2018-01-15 NOTE — Patient Instructions (Addendum)
Refer to ENT to chronic hoarseness  May stop Symbicort  Refer to pulmonary rehab  Will be back in touch regarding Remicade.  Follow up with Dr. Vaughan Browner in 1 month and As needed   Delsym 2 tsp Twice daily  As needed  Cough .  Please contact office for sooner follow up if symptoms do not improve or worsen or seek emergency care

## 2018-01-18 ENCOUNTER — Encounter: Payer: Self-pay | Admitting: Adult Health

## 2018-01-18 NOTE — Telephone Encounter (Signed)
To: [facility]    From: SALATHIEL FERRARA    Created: 01/18/2018 4:01 PM     *-*-*This message has not been handled.*-*-*  Tammy, this Manuel Atkins 06/16/1933.I visited you on Friday 01/15/18 concerning the shortness of breath I have been experiencing for some time. I have a question for you. I have over the last few years become very lax in keeping my bi-pap clean.I just don't clean it as often as I know I should. Could this have anything to do with my present lung condition. I will do a better job from now on regardless of whether you think it has been bad for my lungs. Thank you.     TP, please advise. Thanks!

## 2018-01-19 NOTE — Telephone Encounter (Signed)
Per TP: don't think that is contributing at this time but would recommend to clean the mask daily, the tubing as directed and change supplies regularly as insurance will allow.  Thank you.

## 2018-01-26 ENCOUNTER — Ambulatory Visit (INDEPENDENT_AMBULATORY_CARE_PROVIDER_SITE_OTHER)
Admission: RE | Admit: 2018-01-26 | Discharge: 2018-01-26 | Disposition: A | Discharge status: Home / Self Care | Payer: Medicare Other | Source: Ambulatory Visit | Attending: Adult Health | Admitting: Adult Health

## 2018-01-26 DIAGNOSIS — J849 Interstitial pulmonary disease, unspecified: Secondary | ICD-10-CM

## 2018-01-26 DIAGNOSIS — R0602 Shortness of breath: Secondary | ICD-10-CM | POA: Diagnosis not present

## 2018-01-27 ENCOUNTER — Telehealth: Payer: Self-pay | Admitting: Pulmonary Disease

## 2018-01-27 DIAGNOSIS — J849 Interstitial pulmonary disease, unspecified: Secondary | ICD-10-CM

## 2018-01-27 NOTE — Telephone Encounter (Signed)
Notes recorded by Melvenia Needles, NP on 01/27/2018 at 8:42 AM EDT CT chest shows ILD changes , /most c/w NSIP - No sign of acute infection /PNA noted. Does not explain why the change in level of sob .  Keep follow up with Dr. Vaughan Browner for 6 min walk test Make sure pulm rehab referral was placed.  For now cont on same tx regimen for Ulcerative colitis .   LMOMTCB ------------------- Spoke with pt, aware of results/recs.  pulm rehab referral was placed on 3/8 OV.    Pt is wanting to know if his Remicade infusions could be causing his SOB to worsen.    TP please advise.  Thanks!

## 2018-01-28 NOTE — Telephone Encounter (Signed)
Pt is aware of below message and voiced his understanding. OV and SMW have been scheduled for 03/02/18. CBC has been ordered. Nothing further is needed.

## 2018-01-28 NOTE — Telephone Encounter (Signed)
The CT does not support that however it is hard to tell sometimes if it is from the UC or drugs. For now cont on current regimen  On return get 6 min walk test . I will discuss case with Dr . Vaughan Browner decide if Bronchoscopy is indicated  On return needs a CBC W/ DIFF . To look at eosinophils .   Pt remains on Lialda and remicade. . UC under good control . From bowel perspective.   Spoke to pt.  Please put in cbc w/ diff for ov to return .

## 2018-01-29 ENCOUNTER — Encounter: Payer: Self-pay | Admitting: Adult Health

## 2018-01-29 ENCOUNTER — Telehealth: Payer: Self-pay | Admitting: Adult Health

## 2018-01-29 NOTE — Telephone Encounter (Signed)
Per TP: we apologize for any confusion.  Here's some clarification that will hopefully help: the heart is a little enlarged, this was seen on the echo in January.  There is some plaque buildup and the CT Chest is not good at indicating how much buildup there is or how severe it is.  Would recommend he follow up with his PCP to discuss this further and receive additional testing/referral to cardiology if he's never had a stress test.  The echo that was done in January was about the valves in the heart, it was our understanding that the ordering provider discussed his heart health in detail with him.  So sorry again for any confusion.  Thank you.

## 2018-01-29 NOTE — Telephone Encounter (Signed)
Tammy- please advise The following is from an email that the pt sent:   Tammy, I have read, as has my wife and other family members the entire posted report by you concerning the recent CT scan. While there are quite a few words or terms we do not understand it seems to [place] maybe the heart deserves at least as much scrutiny as the lungs. Please let us know your thoughts on this.

## 2018-01-29 NOTE — Telephone Encounter (Signed)
Called and spoke with patient advised him of the response below from TP. Patient is aware and verbalized understanding. Nothing further needed.

## 2018-01-30 ENCOUNTER — Encounter (INDEPENDENT_AMBULATORY_CARE_PROVIDER_SITE_OTHER): Payer: Self-pay | Admitting: Internal Medicine

## 2018-02-03 ENCOUNTER — Telehealth (INDEPENDENT_AMBULATORY_CARE_PROVIDER_SITE_OTHER): Payer: Self-pay | Admitting: Internal Medicine

## 2018-02-03 NOTE — Telephone Encounter (Signed)
Chest CT report noted. I have contacted representative at [facility] to find out if they have any cases or cases of interstitial fibrosis or pneumonitis with the Remicade. So far none but they plan to do literature search and follow-up with me. I have talked with patient and he may get a call from 1 of the reps from [facility]. Patient advised to monitor his respiratory symptoms before and after receiving Remicade infusion.  Next dose due in 2 days.

## 2018-02-05 ENCOUNTER — Encounter (HOSPITAL_COMMUNITY)
Admission: RE | Admit: 2018-02-05 | Discharge: 2018-02-05 | Disposition: A | Discharge status: Home / Self Care | Payer: Medicare Other | Source: Ambulatory Visit | Attending: Internal Medicine | Admitting: Internal Medicine

## 2018-02-05 ENCOUNTER — Encounter (HOSPITAL_COMMUNITY): Payer: Self-pay

## 2018-02-05 DIAGNOSIS — K5190 Ulcerative colitis, unspecified, without complications: Secondary | ICD-10-CM | POA: Diagnosis not present

## 2018-02-05 MED ORDER — LORATADINE 10 MG PO TABS: 10.0000 mg | ORAL_TABLET | Freq: Once | ORAL | Status: DC

## 2018-02-05 MED ORDER — SODIUM CHLORIDE 0.9 % IV SOLN
Freq: Once | INTRAVENOUS | Status: AC
  Administered 2018-02-05: 250 mL via INTRAVENOUS

## 2018-02-05 MED ORDER — ACETAMINOPHEN 325 MG PO TABS: 650.0000 mg | ORAL_TABLET | Freq: Once | ORAL | Status: DC

## 2018-02-05 MED ORDER — DIPHENHYDRAMINE HCL 25 MG PO CAPS: 50.0000 mg | ORAL_CAPSULE | Freq: Once | ORAL | Status: DC

## 2018-02-05 MED ORDER — SODIUM CHLORIDE 0.9 % IV SOLN
500.0000 mg | Freq: Once | INTRAVENOUS | Status: AC
  Administered 2018-02-05: 500 mg via INTRAVENOUS
  Filled 2018-02-05: qty 50

## 2018-02-06 ENCOUNTER — Encounter (INDEPENDENT_AMBULATORY_CARE_PROVIDER_SITE_OTHER): Payer: Self-pay | Admitting: Internal Medicine

## 2018-02-08 ENCOUNTER — Telehealth: Payer: Self-pay | Admitting: Pulmonary Disease

## 2018-02-08 DIAGNOSIS — G4733 Obstructive sleep apnea (adult) (pediatric): Secondary | ICD-10-CM

## 2018-02-08 NOTE — Telephone Encounter (Signed)
Called and spoke with pt who stated he called [facility] to see if they had his supplies for CPAP ready for him and they stated an order was not received.  Stated to pt I would send an order to [facility] for him to be able to received supplies.  Pt expressed understanding. Nothing further needed at this time.

## 2018-02-09 ENCOUNTER — Telehealth: Payer: Self-pay | Admitting: Pulmonary Disease

## 2018-02-09 NOTE — Telephone Encounter (Signed)
lmtcb x1 for pt. 

## 2018-02-11 ENCOUNTER — Ambulatory Visit (INDEPENDENT_AMBULATORY_CARE_PROVIDER_SITE_OTHER): Payer: Medicare Other | Admitting: Otolaryngology

## 2018-02-11 DIAGNOSIS — R490 Dysphonia: Secondary | ICD-10-CM

## 2018-02-24 DIAGNOSIS — G44229 Chronic tension-type headache, not intractable: Secondary | ICD-10-CM | POA: Diagnosis not present

## 2018-03-02 ENCOUNTER — Ambulatory Visit: Payer: Medicare Other | Admitting: Pulmonary Disease

## 2018-03-02 ENCOUNTER — Ambulatory Visit: Payer: Medicare Other

## 2018-03-03 ENCOUNTER — Encounter (HOSPITAL_COMMUNITY): Payer: Self-pay

## 2018-03-03 ENCOUNTER — Encounter (HOSPITAL_COMMUNITY)
Admission: RE | Admit: 2018-03-03 | Discharge: 2018-03-03 | Disposition: A | Discharge status: Home / Self Care | Payer: Medicare Other | Source: Ambulatory Visit | Attending: Pulmonary Disease | Admitting: Pulmonary Disease

## 2018-03-03 VITALS — BP 154/54 | HR 77 | Ht 71.0 in | Wt 210.0 lb

## 2018-03-03 DIAGNOSIS — J849 Interstitial pulmonary disease, unspecified: Secondary | ICD-10-CM

## 2018-03-03 NOTE — Progress Notes (Signed)
Daily Session Note  Patient Details  Name: Manuel Atkins MRN: [id number] Date of Birth: 27-Jun-1933 Referring Provider:     PULMONARY REHAB OTHER RESP ORIENTATION from 03/03/2018 in [place]  Referring Provider  Dr. Vaughan Browner      Encounter Date: 03/03/2018  Check In: Session Check In - 03/03/18 1230      Check-In   Location  [facility]    Staff Present  Aundra Dubin, RN, BSN;Gregory Cowan, BS, EP, Exercise Physiologist    Medication changes reported      No    Fall or balance concerns reported     Yes    Comments  Patient has a h/o of falls and reports balance issues.     Tobacco Cessation  -- Patient quit smoking in 1970 with 20 pack years.     Warm-up and Cool-down  Performed as group-led Higher education careers adviser Performed  Yes    VAD Patient?  No      Pain Assessment   Currently in Pain?  No/denies    Pain Score  0-No pain    Multiple Pain Sites  No       Capillary Blood Glucose: No results found for this or any previous visit (from the past 24 hour(s)).    Social History   Tobacco Use  Smoking Status Former Smoker  . Years: 20.00  . Types: Pipe, Cigars  . Last attempt to quit: 11/10/1968  . Years since quitting: 49.3  Smokeless Tobacco Never Used    Goals Met:  Personal goals reviewed No report of cardiac concerns or symptoms Strength training completed today  Goals Unmet:  Not Applicable  Comments: Check out 1430.   Dr. Sinda Du is Medical Director for [facility].

## 2018-03-03 NOTE — Progress Notes (Signed)
Pulmonary Individual Treatment Plan  Patient Details  Name: Manuel Atkins MRN: [id number] Date of Birth: 1933-04-11 Referring Provider:     PULMONARY REHAB OTHER RESP ORIENTATION from 03/03/2018 in [place]  Referring Provider  Dr. Vaughan Browner      Initial Encounter Date:    PULMONARY REHAB OTHER RESP ORIENTATION from 03/03/2018 in [facility]  Date  03/03/18  Referring Provider  Dr. Vaughan Browner      Visit Diagnosis: ILD (interstitial lung disease) ([place])  Patient's Home Medications on Admission:   Current Outpatient Medications:  .  ALPRAZolam (XANAX) 0.25 MG tablet, Take 1 tablet (0.25 mg total) by mouth 3 (three) times daily as needed. for anxiety, Disp: 90 tablet, Rfl: 2 .  aspirin EC 81 MG tablet, Take 81 mg by mouth daily., Disp: , Rfl:  .  budesonide-formoterol (SYMBICORT) 160-4.5 MCG/ACT inhaler, Inhale 2 puffs into the lungs 2 (two) times daily., Disp: , Rfl:  .  clobetasol cream (TEMOVATE) 7.51 %, 1 application 2 (two) times daily as needed. , Disp: , Rfl: 3 .  imipramine (TOFRANIL) 25 MG tablet, TAKE  (3) TABLETS BY MOUTH AT BEDTIME; TITRATE AS DIRECTED, Disp: , Rfl: 0 .  ketoconazole (NIZORAL) 2 % cream, Apply 1 application topically 2 (two) times daily as needed for irritation. , Disp: , Rfl: 1 .  latanoprost (XALATAN) 0.005 % ophthalmic solution, Place 1 drop into both eyes at bedtime., Disp: , Rfl: 6 .  LIALDA 1.2 g EC tablet, TAKE TWO (2) TABLETS BY MOUTH TWICE DAILY, Disp: 360 tablet, Rfl: 4 .  losartan (COZAAR) 100 MG tablet, Take 100 mg by mouth daily., Disp: , Rfl:  .  metFORMIN (GLUCOPHAGE) 500 MG tablet, Take 1,000 mg by mouth every evening. , Disp: , Rfl:  .  pantoprazole (PROTONIX) 40 MG tablet, TAKE ONE TABLET BY MOUTH EVERY *OTHER* DAY, Disp: 30 tablet, Rfl: 5 .  Probiotic Product (PROBIOTIC DAILY PO), Take 1 capsule by mouth daily. , Disp: , Rfl:  .  sodium chloride 0.9 % SOLN with inFLIXimab 100 MG SOLR, Inject 5 mg/kg  into the vein every 8 (eight) weeks. Last infusion was 07/08/13., Disp: , Rfl:  .  triamcinolone cream (KENALOG) 0.1 %, Apply 1 application topically as needed (irritation). , Disp: , Rfl:   Past Medical History: Past Medical History:  Diagnosis Date  . 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 Dyspnea   . Essential hypertension, benign   . GERD (gastroesophageal reflux disease)   . Headache(784.0)   . History of colon polyps   . History of transfusion of whole blood   . Interstitial lung disease ([place]) 12/01/2017  . Mixed hyperlipidemia   . Obstructive sleep apnea (adult) (pediatric)    uses bipap @ HS  . Other malaise and fatigue   . Thrombocytopenia ([place]) 11/10/2012  . Type II or unspecified type diabetes mellitus without mention of complication, uncontrolled   . 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 Years: 20.00  . Types: Pipe, Cigars  . Last attempt to quit: 11/10/1968  . Years since quitting: 49.3  Smokeless Tobacco Never Used    Labs: Recent Chemical engineer    Labs for ITP Cardiac and Pulmonary Rehab Latest Ref Rng & Units 11/07/2012   Hemoglobin A1c <5.7 % 6.2(H)      Capillary Blood Glucose: Lab Results  Component Value Date   GLUCAP 139 (  H) 12/15/2017   GLUCAP 133 (H) 06/16/2013   GLUCAP 71 06/16/2013   GLUCAP 108 (H) 11/10/2012   GLUCAP 110 (H) 11/09/2012     Pulmonary Assessment Scores: Pulmonary Assessment Scores    Row Name 03/03/18 1440         ADL UCSD   ADL Phase  Entry     SOB Score total  69     Rest  0     Walk  6     Stairs  4     Bath  4     Dress  4     Shop  3       CAT Score   CAT Score  18       mMRC Score   mMRC Score  3        Pulmonary Function Assessment: Pulmonary Function Assessment - 03/03/18 1452      Pulmonary Function Tests   FVC%  58 %    FEV1%  67 %    FEV1/FVC Ratio  82    DLCO%  14.81 %      Post Bronchodilator  Spirometry Results   FVC%  54 %    FEV1%  66 %    FEV1/FVC Ratio  86       Exercise Target Goals: Date: 03/03/18  Exercise Program Goal: Individual exercise prescription set using results from initial 6 min walk test and THRR while considering  patient's activity barriers and safety.   Exercise Prescription Goal: Initial exercise prescription builds to 30-45 minutes a day of aerobic activity, 2-3 days per week.  Home exercise guidelines will be given to patient during program as part of exercise prescription that the participant will acknowledge.  Activity Barriers & Risk Stratification: Activity Barriers & Cardiac Risk Stratification - 03/03/18 1405      Activity Barriers & Cardiac Risk Stratification   Activity Barriers  Shortness of Breath    Cardiac Risk Stratification  High       6 Minute Walk: 6 Minute Walk    Row Name 03/03/18 1404         6 Minute Walk   Phase  Initial     Distance  1100 feet     Distance % Change  0 %     Distance Feet Change  0 ft     Walk Time  6 minutes     # of Rest Breaks  0     MPH  2.08     METS  2.59     RPE  12     Perceived Dyspnea   14     VO2 Peak  7.16     Symptoms  No     Resting HR  77 bpm     Resting BP  154/74     Resting Oxygen Saturation   98 %     Exercise Oxygen Saturation  during 6 min walk  90 %     Max Ex. HR  79 bpm     Max Ex. BP  160/74     2 Minute Post BP  144/74        Oxygen Initial Assessment: Oxygen Initial Assessment - 03/03/18 1402      Home Oxygen   Home Oxygen Device  None    Sleep Oxygen Prescription  BiPAP    Home Exercise Oxygen Prescription  None    Home at Rest Exercise Oxygen Prescription  None  Initial 6 min Walk   Oxygen Used  None      Program Oxygen Prescription   Program Oxygen Prescription  None      Intervention   Short Term Goals  To learn and understand importance of monitoring SPO2 with pulse oximeter and demonstrate accurate use of the pulse oximeter.;To learn and  understand importance of maintaining oxygen saturations>88%;To learn and demonstrate proper pursed lip breathing techniques or other breathing techniques.    Long  Term Goals  Verbalizes importance of monitoring SPO2 with pulse oximeter and return demonstration;Maintenance of O2 saturations>88%;Exhibits proper breathing techniques, such as pursed lip breathing or other method taught during program session       Oxygen Re-Evaluation:   Oxygen Discharge (Final Oxygen Re-Evaluation):   Initial Exercise Prescription: Initial Exercise Prescription - 03/03/18 1400      Date of Initial Exercise RX and Referring Provider   Date  03/03/18    Referring Provider  Dr. Vaughan Browner      Treadmill   MPH  1.3    Grade  0    Minutes  15    METs  1.9      NuStep   Level  1    SPM  74    Minutes  20    METs  1.9      Prescription Details   Frequency (times per week)  3    Duration  Progress to 30 minutes of continuous aerobic without signs/symptoms of physical distress      Intensity   THRR 40-80% of Max Heartrate  101-113-126    Ratings of Perceived Exertion  11-13    Perceived Dyspnea  0-4      Progression   Progression  Continue progressive overload as per policy without signs/symptoms or physical distress.      Resistance Training   Training Prescription  Yes    Weight  1    Reps  10-15       Perform Capillary Blood Glucose checks as needed.  Exercise Prescription Changes:   Exercise Comments:   Exercise Goals and Review:  Exercise Goals    Row Name 03/03/18 1406             Exercise Goals   Increase Physical Activity  Yes       Intervention  Provide advice, education, support and counseling about physical activity/exercise needs.;Develop an individualized exercise prescription for aerobic and resistive training based on initial evaluation findings, risk stratification, comorbidities and participant's personal goals.       Expected Outcomes  Short Term: Attend rehab  on a regular basis to increase amount of physical activity.       Increase Strength and Stamina  Yes       Intervention  Provide advice, education, support and counseling about physical activity/exercise needs.;Develop an individualized exercise prescription for aerobic and resistive training based on initial evaluation findings, risk stratification, comorbidities and participant's personal goals.       Expected Outcomes  Short Term: Increase workloads from initial exercise prescription for resistance, speed, and METs.       Able to understand and use rate of perceived exertion (RPE) scale  Yes       Intervention  Provide education and explanation on how to use RPE scale       Expected Outcomes  Short Term: Able to use RPE daily in rehab to express subjective intensity level;Long Term:  Able to use RPE to guide intensity level when exercising independently  Able to understand and use Dyspnea scale  Yes       Intervention  Provide education and explanation on how to use Dyspnea scale       Expected Outcomes  Short Term: Able to use Dyspnea scale daily in rehab to express subjective sense of shortness of breath during exertion;Long Term: Able to use Dyspnea scale to guide intensity level when exercising independently       Knowledge and understanding of Target Heart Rate Range (THRR)  Yes       Intervention  Provide education and explanation of THRR including how the numbers were predicted and where they are located for reference       Expected Outcomes  Short Term: Able to state/look up THRR;Long Term: Able to use THRR to govern intensity when exercising independently;Short Term: Able to use daily as guideline for intensity in rehab       Able to check pulse independently  Yes       Intervention  Provide education and demonstration on how to check pulse in carotid and radial arteries.;Review the importance of being able to check your own pulse for safety during independent exercise       Expected  Outcomes  Short Term: Able to explain why pulse checking is important during independent exercise;Long Term: Able to check pulse independently and accurately       Understanding of Exercise Prescription  Yes       Intervention  Provide education, explanation, and written materials on patient's individual exercise prescription       Expected Outcomes  Short Term: Able to explain program exercise prescription;Long Term: Able to explain home exercise prescription to exercise independently          Exercise Goals Re-Evaluation :   Discharge Exercise Prescription (Final Exercise Prescription Changes):   Nutrition:  Target Goals: Understanding of nutrition guidelines, daily intake of sodium <1516m, cholesterol <2078m calories 30% from fat and 7% or less from saturated fats, daily to have 5 or more servings of fruits and vegetables.  Biometrics: Pre Biometrics - 03/03/18 1406      Pre Biometrics   Height  5' 11"  (1.803 m)    Weight  210 lb (95.3 kg)    Waist Circumference  42 inches    Hip Circumference  43 inches    Waist to Hip Ratio  0.98 %    BMI (Calculated)  29.3    Triceps Skinfold  9 mm    % Body Fat  27.2 %    Grip Strength  62.29 kg    Flexibility  0 in    Single Leg Stand  2 seconds        Nutrition Therapy Plan and Nutrition Goals:   Nutrition Assessments: Nutrition Assessments - 03/03/18 1443      MEDFICTS Scores   Pre Score  36       Nutrition Goals Re-Evaluation:   Nutrition Goals Discharge (Final Nutrition Goals Re-Evaluation):   Psychosocial: Target Goals: Acknowledge presence or absence of significant depression and/or stress, maximize coping skills, provide positive support system. Participant is able to verbalize types and ability to use techniques and skills needed for reducing stress and depression.  Initial Review & Psychosocial Screening: Initial Psych Review & Screening - 03/03/18 1445      Initial Review   Current issues with  None  Identified      Family Dynamics   Good Support System?  Yes    Comments  Patient initial  QOL score was 27.44 and his PHQ-9 score was 7 with no psychosocial issues identified.       Barriers   Psychosocial barriers to participate in program  There are no identifiable barriers or psychosocial needs.      Screening Interventions   Interventions  Encouraged to exercise    Expected Outcomes  Short Term goal: Identification and review with participant of any Quality of Life or Depression concerns found by scoring the questionnaire.;Long Term goal: The participant improves quality of Life and PHQ9 Scores as seen by post scores and/or verbalization of changes       Quality of Life Scores: Quality of Life - 03/03/18 1239      Quality of Life Scores   Health/Function Pre  24.88 %    Socioeconomic Pre  29.58 %    Psych/Spiritual Pre  29.64 %    Family Pre  30 %    GLOBAL Pre  27.44 %      Scores of 19 and below usually indicate a poorer quality of life in these areas.  A difference of  2-3 points is a clinically meaningful difference.  A difference of 2-3 points in the total score of the Quality of Life Index has been associated with significant improvement in overall quality of life, self-image, physical symptoms, and general health in studies assessing change in quality of life.   PHQ-9: Recent Review Flowsheet Data    Depression screen [facility] 2/9 03/03/2018   Decreased Interest 0   Down, Depressed, Hopeless 0   PHQ - 2 Score 0   Altered sleeping 0   Tired, decreased energy 3   Change in appetite 3   Feeling bad or failure about yourself  0   Trouble concentrating 1   Moving slowly or fidgety/restless 0   Suicidal thoughts 0   PHQ-9 Score 7   Difficult doing work/chores Not difficult at all     Interpretation of Total Score  Total Score Depression Severity:  1-4 = Minimal depression, 5-9 = Mild depression, 10-14 = Moderate depression, 15-19 = Moderately severe depression, 20-27 =  Severe depression   Psychosocial Evaluation and Intervention: Psychosocial Evaluation - 03/03/18 1446      Psychosocial Evaluation & Interventions   Interventions  Relaxation education;Stress management education;Encouraged to exercise with the program and follow exercise prescription    Comments  There are no psychosocial issues identified at orientation.     Expected Outcomes  Patient will have no psychosocial issues identified at discharge.     Continue Psychosocial Services   No Follow up required       Psychosocial Re-Evaluation:   Psychosocial Discharge (Final Psychosocial Re-Evaluation):    Education: Education Goals: Education classes will be provided on a weekly basis, covering required topics. Participant will state understanding/return demonstration of topics presented.  Learning Barriers/Preferences: Learning Barriers/Preferences - 03/03/18 1439      Learning Barriers/Preferences   Learning Barriers  None    Learning Preferences  Skilled Demonstration;Written Material       Education Topics: How Lungs Work and Diseases: - Discuss the anatomy of the lungs and diseases that can affect the lungs, such as COPD.   Exercise: -Discuss the importance of exercise, FITT principles of exercise, normal and abnormal responses to exercise, and how to exercise safely.   Environmental Irritants: -Discuss types of environmental irritants and how to limit exposure to environmental irritants.   Meds/Inhalers and oxygen: - Discuss respiratory medications, definition of an inhaler and oxygen, and  the proper way to use an inhaler and oxygen.   Energy Saving Techniques: - Discuss methods to conserve energy and decrease shortness of breath when performing activities of daily living.    Bronchial Hygiene / Breathing Techniques: - Discuss breathing mechanics, pursed-lip breathing technique,  proper posture, effective ways to clear airways, and other functional breathing  techniques   Cleaning Equipment: - Provides group verbal and written instruction about the health risks of elevated stress, cause of high stress, and healthy ways to reduce stress.   Nutrition I: Fats: - Discuss the types of cholesterol, what cholesterol does to the body, and how cholesterol levels can be controlled.   Nutrition II: Labels: -Discuss the different components of food labels and how to read food labels.   Respiratory Infections: - Discuss the signs and symptoms of respiratory infections, ways to prevent respiratory infections, and the importance of seeking medical treatment when having a respiratory infection.   Stress I: Signs and Symptoms: - Discuss the causes of stress, how stress may lead to anxiety and depression, and ways to limit stress.   Stress II: Relaxation: -Discuss relaxation techniques to limit stress.   Oxygen for Home/Travel: - Discuss how to prepare for travel when on oxygen and proper ways to transport and store oxygen to ensure safety.   Knowledge Questionnaire Score: Knowledge Questionnaire Score - 03/03/18 1439      Knowledge Questionnaire Score   Pre Score  12/18       Core Components/Risk Factors/Patient Goals at Admission: Personal Goals and Risk Factors at Admission - 03/03/18 1443      Core Components/Risk Factors/Patient Goals on Admission    Weight Management  Yes    Admit Weight  210 lb (95.3 kg)    Goal Weight: Short Term  205 lb (93 kg)    Goal Weight: Long Term  200 lb (90.7 kg)    Expected Outcomes  Short Term: Continue to assess and modify interventions until short term weight is achieved;Long Term: Adherence to nutrition and physical activity/exercise program aimed toward attainment of established weight goal    Improve shortness of breath with ADL's  Yes    Intervention  Provide education, individualized exercise plan and daily activity instruction to help decrease symptoms of SOB with activities of daily living.     Expected Outcomes  Short Term: Improve cardiorespiratory fitness to achieve a reduction of symptoms when performing ADLs;Long Term: Be able to perform more ADLs without symptoms or delay the onset of symptoms    Diabetes  Yes    Intervention  Provide education about signs/symptoms and action to take for hypo/hyperglycemia.;Provide education about proper nutrition, including hydration, and aerobic/resistive exercise prescription along with prescribed medications to achieve blood glucose in normal ranges: Fasting glucose 65-99 mg/dL    Expected Outcomes  Long Term: Attainment of HbA1C < 7%.    Personal Goal Other  Yes    Personal Goal  Improve DM control; breathe better; increase strength.     Intervention  Patient will attend PR 2 days/week and supplement with exercise 3 days/week.     Expected Outcomes  Patient will meet his personal goals.        Core Components/Risk Factors/Patient Goals Review:    Core Components/Risk Factors/Patient Goals at Discharge (Final Review):    ITP Comments:   Comments: Patient arrived for 1st visit/orientation/education at 1230. Patient was referred to PR by Dr. Vaughan Browner  due to IDL (J84.9). During orientation advised patient on arrival and appointment times  what to wear, what to do before, during and after exercise. Reviewed attendance and class policy. Talked about inclement weather and class consultation policy. Pt is scheduled to return Pulmonary Rehab on 03/09/18 at 1:30. Pt was advised to come to class 15 minutes before class starts. Patient was also given instructions on meeting with the dietician and attending the Family Structure classes. Discussed RPE/Dpysnea scales. Discussed initial THR and how to find their radial and/or carotid pulse. Discussed the initial exercise prescription and how this effects their progress. Pt is eager to get started. Patient participated in warm up stretches followed by light weights and resistance bands. Patient was able to  complete 6 minute walk test. Patient had no complaints of pain. Patient was measured for the equipment. Discussed equipment safety with patient. Took patient pre-anthropometric measurements. Patient finished visit at 1430.

## 2018-03-03 NOTE — Progress Notes (Signed)
Cardiac/Pulmonary Rehab Medication Review by a Pharmacist  Does the patient  feel that his/her medications are working for him/her?  yes  Has the patient been experiencing any side effects to the medications prescribed?  no  Does the patient measure his/her own blood pressure or blood glucose at home?  no   Does the patient have any problems obtaining medications due to transportation or finances?   no  Understanding of regimen: good Understanding of indications: good Potential of compliance: good  Questions asked to Determine Patient Understanding of Medication Regimen:  1. What is the name of the medication?  2. What is the medication used for?  3. When should it be taken?  4. How much should be taken?  5. How will you take it?  6. What side effects should you report?  Understanding Defined as: Excellent: All questions above are correct Good: Questions 1-4 are correct Fair: Questions 1-2 are correct  Poor: 1 or none of the above questions are correct   Pharmacist comments: Pt does not report any adverse effects from medications.  Pt states he only checks his BP at home rarely, recommended he step it up to a few times per week and keep a log for MD to review at appointments.  Med list is up to date.    Hart Robinsons A 03/03/2018 2:28 PM

## 2018-03-05 ENCOUNTER — Ambulatory Visit: Payer: Medicare Other

## 2018-03-05 ENCOUNTER — Other Ambulatory Visit (INDEPENDENT_AMBULATORY_CARE_PROVIDER_SITE_OTHER): Payer: Medicare Other

## 2018-03-05 ENCOUNTER — Ambulatory Visit (INDEPENDENT_AMBULATORY_CARE_PROVIDER_SITE_OTHER): Payer: Medicare Other | Admitting: Pulmonary Disease

## 2018-03-05 ENCOUNTER — Encounter: Payer: Self-pay | Admitting: Pulmonary Disease

## 2018-03-05 VITALS — BP 140/68 | HR 69 | Ht 71.0 in | Wt 208.0 lb

## 2018-03-05 DIAGNOSIS — J849 Interstitial pulmonary disease, unspecified: Secondary | ICD-10-CM

## 2018-03-05 DIAGNOSIS — G4733 Obstructive sleep apnea (adult) (pediatric): Secondary | ICD-10-CM | POA: Diagnosis not present

## 2018-03-05 DIAGNOSIS — I2510 Atherosclerotic heart disease of native coronary artery without angina pectoris: Secondary | ICD-10-CM

## 2018-03-05 LAB — CBC WITH DIFFERENTIAL/PLATELET
BASOS ABS: 0.1 10*3/uL (ref 0.0–0.1)
Basophils Relative: 1.4 % (ref 0.0–3.0)
EOS PCT: 1.5 % (ref 0.0–5.0)
Eosinophils Absolute: 0.1 10*3/uL (ref 0.0–0.7)
HEMATOCRIT: 43.8 % (ref 39.0–52.0)
Hemoglobin: 15.3 g/dL (ref 13.0–17.0)
LYMPHS ABS: 1.9 10*3/uL (ref 0.7–4.0)
LYMPHS PCT: 28.7 % (ref 12.0–46.0)
MCHC: 34.9 g/dL (ref 30.0–36.0)
MCV: 94.4 fl (ref 78.0–100.0)
MONOS PCT: 14.1 % — AB (ref 3.0–12.0)
Monocytes Absolute: 0.9 10*3/uL (ref 0.1–1.0)
NEUTROS ABS: 3.5 10*3/uL (ref 1.4–7.7)
NEUTROS PCT: 54.3 % (ref 43.0–77.0)
PLATELETS: 178 10*3/uL (ref 150.0–400.0)
RBC: 4.64 Mil/uL (ref 4.22–5.81)
RDW: 13.3 % (ref 11.5–15.5)
WBC: 6.5 10*3/uL (ref 4.0–10.5)

## 2018-03-05 NOTE — Patient Instructions (Addendum)
We will refer you to cardiology for evaluation of coronary artery disease Please continue on pulmonary rehab Follow-up in 3 months.
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
## 2018-03-09 ENCOUNTER — Encounter (HOSPITAL_COMMUNITY)
Admission: RE | Admit: 2018-03-09 | Discharge: 2018-03-09 | Disposition: A | Discharge status: Home / Self Care | Payer: Medicare Other | Source: Ambulatory Visit | Attending: Pulmonary Disease | Admitting: Pulmonary Disease

## 2018-03-09 DIAGNOSIS — J849 Interstitial pulmonary disease, unspecified: Secondary | ICD-10-CM | POA: Diagnosis not present

## 2018-03-09 NOTE — Progress Notes (Signed)
Daily Session Note  Patient Details  Name: ASIA FAVATA MRN: [id number] Date of Birth: [birth year] Referring Provider:     PULMONARY REHAB OTHER RESP ORIENTATION from 03/03/2018 in [facility]  Referring Provider  Dr. Vaughan Browner      Encounter Date: 03/09/2018  Check In: Session Check In - 03/09/18 1341      Check-In   Location  [facility]    Staff Present  Suzanne Boron, BS, EP, Exercise Physiologist;Diane Coad, MS, EP, [facility] Exercise Physiologist    Supervising physician immediately available to respond to emergencies  See telemetry face sheet for immediately available MD    Medication changes reported      No    Fall or balance concerns reported     Yes    Comments  Patient has a h/o of falls and reports balance issues.     Warm-up and Cool-down  Performed as group-led Higher education careers adviser Performed  Yes    VAD Patient?  No      Pain Assessment   Currently in Pain?  No/denies    Pain Score  0-No pain    Multiple Pain Sites  No       Capillary Blood Glucose: No results found for this or any previous visit (from the past 24 hour(s)).    Social History   Tobacco Use  Smoking Status Former Smoker  . Years: 20.00  . Types: Pipe, Cigars  . Last attempt to quit: 11/10/1968  . Years since quitting: 49.3  Smokeless Tobacco Never Used    Goals Met:  Independence with exercise equipment Improved SOB with ADL's Using PLB without cueing & demonstrates good technique Exercise tolerated well No report of cardiac concerns or symptoms Strength training completed today  Goals Unmet:  Not Applicable  Comments: Check out 230   Dr. Sinda Du is Medical Director for [facility].

## 2018-03-10 NOTE — Progress Notes (Signed)
Pulmonary Individual Treatment Plan  Patient Details  Name: Manuel Atkins MRN: [id number] Date of Birth: 1933/08/06 Referring Provider:     PULMONARY REHAB OTHER RESP ORIENTATION from 03/03/2018 in [place]  Referring Provider  Dr. Vaughan Browner      Initial Encounter Date:    PULMONARY REHAB OTHER RESP ORIENTATION from 03/03/2018 in [place]  Date  03/03/18  Referring Provider  Dr. Vaughan Browner      Visit Diagnosis: ILD (interstitial lung disease) ([place])  Patient's Home Medications on Admission:   Current Outpatient Medications:  .  ALPRAZolam (XANAX) 0.25 MG tablet, Take 1 tablet (0.25 mg total) by mouth 3 (three) times daily as needed. for anxiety, Disp: 90 tablet, Rfl: 2 .  aspirin EC 81 MG tablet, Take 81 mg by mouth daily., Disp: , Rfl:  .  clobetasol cream (TEMOVATE) 4.58 %, 1 application 2 (two) times daily as needed. , Disp: , Rfl: 3 .  imipramine (TOFRANIL) 25 MG tablet, TAKE  (3) TABLETS BY MOUTH AT BEDTIME; TITRATE AS DIRECTED, Disp: , Rfl: 0 .  ketoconazole (NIZORAL) 2 % cream, Apply 1 application topically 2 (two) times daily as needed for irritation. , Disp: , Rfl: 1 .  latanoprost (XALATAN) 0.005 % ophthalmic solution, Place 1 drop into both eyes at bedtime., Disp: , Rfl: 6 .  LIALDA 1.2 g EC tablet, TAKE TWO (2) TABLETS BY MOUTH TWICE DAILY, Disp: 360 tablet, Rfl: 4 .  losartan (COZAAR) 100 MG tablet, Take 100 mg by mouth daily., Disp: , Rfl:  .  metFORMIN (GLUCOPHAGE) 500 MG tablet, Take 1,000 mg by mouth every evening. , Disp: , Rfl:  .  pantoprazole (PROTONIX) 40 MG tablet, TAKE ONE TABLET BY MOUTH EVERY *OTHER* DAY, Disp: 30 tablet, Rfl: 5 .  Probiotic Product (PROBIOTIC DAILY PO), Take 1 capsule by mouth daily. , Disp: , Rfl:  .  sodium chloride 0.9 % SOLN with inFLIXimab 100 MG SOLR, Inject 5 mg/kg into the vein every 8 (eight) weeks. Last infusion was 07/08/13., Disp: , Rfl:  .  triamcinolone cream (KENALOG) 0.1 %, Apply 1  application topically as needed (irritation). , Disp: , Rfl:   Past Medical History: Past Medical History:  Diagnosis Date  . 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 Dyspnea   . Essential hypertension, benign   . GERD (gastroesophageal reflux disease)   . Headache(784.0)   . History of colon polyps   . History of transfusion of whole blood   . Interstitial lung disease ([place]) 12/01/2017  . Mixed hyperlipidemia   . Obstructive sleep apnea (adult) (pediatric)    uses bipap @ HS  . Other malaise and fatigue   . Thrombocytopenia ([place]) 11/10/2012  . Type II or unspecified type diabetes mellitus without mention of complication, uncontrolled   . 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 Years: 20.00  . Types: Pipe, Cigars  . Last attempt to quit: 11/10/1968  . Years since quitting: 49.3  Smokeless Tobacco Never Used    Labs: Recent Chemical engineer    Labs for ITP Cardiac and Pulmonary Rehab Latest Ref Rng & Units 11/07/2012   Hemoglobin A1c <5.7 % 6.2(H)      Capillary Blood Glucose: Lab Results  Component Value Date   GLUCAP 139 (H) 12/15/2017   GLUCAP 133 (H) 06/16/2013   GLUCAP 71 06/16/2013   GLUCAP 108 (H) 11/10/2012  GLUCAP 110 (H) 11/09/2012     Pulmonary Assessment Scores: Pulmonary Assessment Scores    Row Name 03/03/18 1440         ADL UCSD   ADL Phase  Entry     SOB Score total  69     Rest  0     Walk  6     Stairs  4     Bath  4     Dress  4     Shop  3       CAT Score   CAT Score  18       mMRC Score   mMRC Score  3        Pulmonary Function Assessment: Pulmonary Function Assessment - 03/03/18 1452      Pulmonary Function Tests   FVC%  58 %    FEV1%  67 %    FEV1/FVC Ratio  82    DLCO%  14.81 %      Post Bronchodilator Spirometry Results   FVC%  54 %    FEV1%  66 %    FEV1/FVC Ratio  86       Exercise Target Goals:    Exercise Program  Goal: Individual exercise prescription set using results from initial 6 min walk test and THRR while considering  patient's activity barriers and safety.   Exercise Prescription Goal: Initial exercise prescription builds to 30-45 minutes a day of aerobic activity, 2-3 days per week.  Home exercise guidelines will be given to patient during program as part of exercise prescription that the participant will acknowledge.  Activity Barriers & Risk Stratification: Activity Barriers & Cardiac Risk Stratification - 03/03/18 1405      Activity Barriers & Cardiac Risk Stratification   Activity Barriers  Shortness of Breath    Cardiac Risk Stratification  High       6 Minute Walk: 6 Minute Walk    Row Name 03/03/18 1404         6 Minute Walk   Phase  Initial     Distance  1100 feet     Distance % Change  0 %     Distance Feet Change  0 ft     Walk Time  6 minutes     # of Rest Breaks  0     MPH  2.08     METS  2.59     RPE  12     Perceived Dyspnea   14     VO2 Peak  7.16     Symptoms  No     Resting HR  77 bpm     Resting BP  154/74     Resting Oxygen Saturation   98 %     Exercise Oxygen Saturation  during 6 min walk  90 %     Max Ex. HR  79 bpm     Max Ex. BP  160/74     2 Minute Post BP  144/74        Oxygen Initial Assessment: Oxygen Initial Assessment - 03/03/18 1402      Home Oxygen   Home Oxygen Device  None    Sleep Oxygen Prescription  BiPAP    Home Exercise Oxygen Prescription  None    Home at Rest Exercise Oxygen Prescription  None      Initial 6 min Walk   Oxygen Used  None      Program Oxygen Prescription  Program Oxygen Prescription  None      Intervention   Short Term Goals  To learn and understand importance of monitoring SPO2 with pulse oximeter and demonstrate accurate use of the pulse oximeter.;To learn and understand importance of maintaining oxygen saturations>88%;To learn and demonstrate proper pursed lip breathing techniques or other  breathing techniques.    Long  Term Goals  Verbalizes importance of monitoring SPO2 with pulse oximeter and return demonstration;Maintenance of O2 saturations>88%;Exhibits proper breathing techniques, such as pursed lip breathing or other method taught during program session       Oxygen Re-Evaluation:   Oxygen Discharge (Final Oxygen Re-Evaluation):   Initial Exercise Prescription: Initial Exercise Prescription - 03/03/18 1400      Date of Initial Exercise RX and Referring Provider   Date  03/03/18    Referring Provider  Dr. Vaughan Browner      Treadmill   MPH  1.3    Grade  0    Minutes  15    METs  1.9      NuStep   Level  1    SPM  74    Minutes  20    METs  1.9      Prescription Details   Frequency (times per week)  3    Duration  Progress to 30 minutes of continuous aerobic without signs/symptoms of physical distress      Intensity   THRR 40-80% of Max Heartrate  101-113-126    Ratings of Perceived Exertion  11-13    Perceived Dyspnea  0-4      Progression   Progression  Continue progressive overload as per policy without signs/symptoms or physical distress.      Resistance Training   Training Prescription  Yes    Weight  1    Reps  10-15       Perform Capillary Blood Glucose checks as needed.  Exercise Prescription Changes:   Exercise Comments:   Exercise Goals and Review:  Exercise Goals    Row Name 03/03/18 1406             Exercise Goals   Increase Physical Activity  Yes       Intervention  Provide advice, education, support and counseling about physical activity/exercise needs.;Develop an individualized exercise prescription for aerobic and resistive training based on initial evaluation findings, risk stratification, comorbidities and participant's personal goals.       Expected Outcomes  Short Term: Attend rehab on a regular basis to increase amount of physical activity.       Increase Strength and Stamina  Yes       Intervention  Provide  advice, education, support and counseling about physical activity/exercise needs.;Develop an individualized exercise prescription for aerobic and resistive training based on initial evaluation findings, risk stratification, comorbidities and participant's personal goals.       Expected Outcomes  Short Term: Increase workloads from initial exercise prescription for resistance, speed, and METs.       Able to understand and use rate of perceived exertion (RPE) scale  Yes       Intervention  Provide education and explanation on how to use RPE scale       Expected Outcomes  Short Term: Able to use RPE daily in rehab to express subjective intensity level;Long Term:  Able to use RPE to guide intensity level when exercising independently       Able to understand and use Dyspnea scale  Yes  Intervention  Provide education and explanation on how to use Dyspnea scale       Expected Outcomes  Short Term: Able to use Dyspnea scale daily in rehab to express subjective sense of shortness of breath during exertion;Long Term: Able to use Dyspnea scale to guide intensity level when exercising independently       Knowledge and understanding of Target Heart Rate Range (THRR)  Yes       Intervention  Provide education and explanation of THRR including how the numbers were predicted and where they are located for reference       Expected Outcomes  Short Term: Able to state/look up THRR;Long Term: Able to use THRR to govern intensity when exercising independently;Short Term: Able to use daily as guideline for intensity in rehab       Able to check pulse independently  Yes       Intervention  Provide education and demonstration on how to check pulse in carotid and radial arteries.;Review the importance of being able to check your own pulse for safety during independent exercise       Expected Outcomes  Short Term: Able to explain why pulse checking is important during independent exercise;Long Term: Able to check pulse  independently and accurately       Understanding of Exercise Prescription  Yes       Intervention  Provide education, explanation, and written materials on patient's individual exercise prescription       Expected Outcomes  Short Term: Able to explain program exercise prescription;Long Term: Able to explain home exercise prescription to exercise independently          Exercise Goals Re-Evaluation :   Discharge Exercise Prescription (Final Exercise Prescription Changes):   Nutrition:  Target Goals: Understanding of nutrition guidelines, daily intake of sodium <151m, cholesterol <2040m calories 30% from fat and 7% or less from saturated fats, daily to have 5 or more servings of fruits and vegetables.  Biometrics: Pre Biometrics - 03/03/18 1406      Pre Biometrics   Height  5' 11"  (1.803 m)    Weight  210 lb (95.3 kg)    Waist Circumference  42 inches    Hip Circumference  43 inches    Waist to Hip Ratio  0.98 %    BMI (Calculated)  29.3    Triceps Skinfold  9 mm    % Body Fat  27.2 %    Grip Strength  62.29 kg    Flexibility  0 in    Single Leg Stand  2 seconds        Nutrition Therapy Plan and Nutrition Goals:   Nutrition Assessments: Nutrition Assessments - 03/03/18 1443      MEDFICTS Scores   Pre Score  36       Nutrition Goals Re-Evaluation:   Nutrition Goals Discharge (Final Nutrition Goals Re-Evaluation):   Psychosocial: Target Goals: Acknowledge presence or absence of significant depression and/or stress, maximize coping skills, provide positive support system. Participant is able to verbalize types and ability to use techniques and skills needed for reducing stress and depression.  Initial Review & Psychosocial Screening: Initial Psych Review & Screening - 03/03/18 1445      Initial Review   Current issues with  None Identified      Family Dynamics   Good Support System?  Yes    Comments  Patient initial QOL score was 27.44 and his PHQ-9 score  was 7 with no psychosocial issues identified.  Barriers   Psychosocial barriers to participate in program  There are no identifiable barriers or psychosocial needs.      Screening Interventions   Interventions  Encouraged to exercise    Expected Outcomes  Short Term goal: Identification and review with participant of any Quality of Life or Depression concerns found by scoring the questionnaire.;Long Term goal: The participant improves quality of Life and PHQ9 Scores as seen by post scores and/or verbalization of changes       Quality of Life Scores: Quality of Life - 03/03/18 1239      Quality of Life Scores   Health/Function Pre  24.88 %    Socioeconomic Pre  29.58 %    Psych/Spiritual Pre  29.64 %    Family Pre  30 %    GLOBAL Pre  27.44 %      Scores of 19 and below usually indicate a poorer quality of life in these areas.  A difference of  2-3 points is a clinically meaningful difference.  A difference of 2-3 points in the total score of the Quality of Life Index has been associated with significant improvement in overall quality of life, self-image, physical symptoms, and general health in studies assessing change in quality of life.   PHQ-9: Recent Review Flowsheet Data    Depression screen [facility] 2/9 03/03/2018   Decreased Interest 0   Down, Depressed, Hopeless 0   PHQ - 2 Score 0   Altered sleeping 0   Tired, decreased energy 3   Change in appetite 3   Feeling bad or failure about yourself  0   Trouble concentrating 1   Moving slowly or fidgety/restless 0   Suicidal thoughts 0   PHQ-9 Score 7   Difficult doing work/chores Not difficult at all     Interpretation of Total Score  Total Score Depression Severity:  1-4 = Minimal depression, 5-9 = Mild depression, 10-14 = Moderate depression, 15-19 = Moderately severe depression, 20-27 = Severe depression   Psychosocial Evaluation and Intervention: Psychosocial Evaluation - 03/03/18 1446      Psychosocial  Evaluation & Interventions   Interventions  Relaxation education;Stress management education;Encouraged to exercise with the program and follow exercise prescription    Comments  There are no psychosocial issues identified at orientation.     Expected Outcomes  Patient will have no psychosocial issues identified at discharge.     Continue Psychosocial Services   No Follow up required       Psychosocial Re-Evaluation:   Psychosocial Discharge (Final Psychosocial Re-Evaluation):    Education: Education Goals: Education classes will be provided on a weekly basis, covering required topics. Participant will state understanding/return demonstration of topics presented.  Learning Barriers/Preferences: Learning Barriers/Preferences - 03/03/18 1439      Learning Barriers/Preferences   Learning Barriers  None    Learning Preferences  Skilled Demonstration;Written Material       Education Topics: How Lungs Work and Diseases: - Discuss the anatomy of the lungs and diseases that can affect the lungs, such as COPD.   Exercise: -Discuss the importance of exercise, FITT principles of exercise, normal and abnormal responses to exercise, and how to exercise safely.   Environmental Irritants: -Discuss types of environmental irritants and how to limit exposure to environmental irritants.   Meds/Inhalers and oxygen: - Discuss respiratory medications, definition of an inhaler and oxygen, and the proper way to use an inhaler and oxygen.   Energy Saving Techniques: - Discuss methods to conserve energy and  decrease shortness of breath when performing activities of daily living.    Bronchial Hygiene / Breathing Techniques: - Discuss breathing mechanics, pursed-lip breathing technique,  proper posture, effective ways to clear airways, and other functional breathing techniques   Cleaning Equipment: - Provides group verbal and written instruction about the health risks of elevated stress,  cause of high stress, and healthy ways to reduce stress.   Nutrition I: Fats: - Discuss the types of cholesterol, what cholesterol does to the body, and how cholesterol levels can be controlled.   Nutrition II: Labels: -Discuss the different components of food labels and how to read food labels.   Respiratory Infections: - Discuss the signs and symptoms of respiratory infections, ways to prevent respiratory infections, and the importance of seeking medical treatment when having a respiratory infection.   Stress I: Signs and Symptoms: - Discuss the causes of stress, how stress may lead to anxiety and depression, and ways to limit stress.   Stress II: Relaxation: -Discuss relaxation techniques to limit stress.   Oxygen for Home/Travel: - Discuss how to prepare for travel when on oxygen and proper ways to transport and store oxygen to ensure safety.   Knowledge Questionnaire Score: Knowledge Questionnaire Score - 03/03/18 1439      Knowledge Questionnaire Score   Pre Score  12/18       Core Components/Risk Factors/Patient Goals at Admission: Personal Goals and Risk Factors at Admission - 03/03/18 1443      Core Components/Risk Factors/Patient Goals on Admission    Weight Management  Yes    Admit Weight  210 lb (95.3 kg)    Goal Weight: Short Term  205 lb (93 kg)    Goal Weight: Long Term  200 lb (90.7 kg)    Expected Outcomes  Short Term: Continue to assess and modify interventions until short term weight is achieved;Long Term: Adherence to nutrition and physical activity/exercise program aimed toward attainment of established weight goal    Improve shortness of breath with ADL's  Yes    Intervention  Provide education, individualized exercise plan and daily activity instruction to help decrease symptoms of SOB with activities of daily living.    Expected Outcomes  Short Term: Improve cardiorespiratory fitness to achieve a reduction of symptoms when performing ADLs;Long  Term: Be able to perform more ADLs without symptoms or delay the onset of symptoms    Diabetes  Yes    Intervention  Provide education about signs/symptoms and action to take for hypo/hyperglycemia.;Provide education about proper nutrition, including hydration, and aerobic/resistive exercise prescription along with prescribed medications to achieve blood glucose in normal ranges: Fasting glucose 65-99 mg/dL    Expected Outcomes  Long Term: Attainment of HbA1C < 7%.    Personal Goal Other  Yes    Personal Goal  Improve DM control; breathe better; increase strength.     Intervention  Patient will attend PR 2 days/week and supplement with exercise 3 days/week.     Expected Outcomes  Patient will meet his personal goals.        Core Components/Risk Factors/Patient Goals Review:    Core Components/Risk Factors/Patient Goals at Discharge (Final Review):    ITP Comments: ITP Comments    Row Name 03/10/18 1457           ITP Comments  Patient new to the program completing 2 sessions. Will continue to monitor for progress.           Comments: ITP 30 Day REVIEW Patient new  to the program completing 2 sessions. Will continue to monitor for progress.

## 2018-03-11 ENCOUNTER — Encounter (HOSPITAL_COMMUNITY)
Admission: RE | Admit: 2018-03-11 | Discharge: 2018-03-11 | Disposition: A | Discharge status: Home / Self Care | Payer: Medicare Other | Source: Ambulatory Visit | Attending: Pulmonary Disease | Admitting: Pulmonary Disease

## 2018-03-11 DIAGNOSIS — J849 Interstitial pulmonary disease, unspecified: Secondary | ICD-10-CM | POA: Insufficient documentation

## 2018-03-11 NOTE — Progress Notes (Signed)
Patient recieved the take home exercise plan today 03/11/2018. THR was addressed as were safety guidelines for being active when not here in [place]. Patient demonstrated an understanding and was encouraged to ask any future questions as they arise.

## 2018-03-11 NOTE — Progress Notes (Signed)
Daily Session Note  Patient Details  Name: MARSHA HILLMAN MRN: [id number] Date of Birth: 1933-09-04 Referring Provider:     PULMONARY REHAB OTHER RESP ORIENTATION from 03/03/2018 in [place]  Referring Provider  Dr. Vaughan Browner      Encounter Date: 03/11/2018  Check In: Session Check In - 03/11/18 1417      Check-In   Location  [facility]    Staff Present  Suzanne Boron, BS, EP, Exercise Physiologist;Diane Coad, MS, EP, CHC, Exercise Physiologist;Debra Wynetta Emery, RN, BSN    Supervising physician immediately available to respond to emergencies  See telemetry face sheet for immediately available MD    Medication changes reported      No    Fall or balance concerns reported     Yes    Comments  Patient has a h/o of falls and reports balance issues.     Warm-up and Cool-down  Performed as group-led Higher education careers adviser Performed  Yes    VAD Patient?  No      Pain Assessment   Currently in Pain?  No/denies    Pain Score  0-No pain    Multiple Pain Sites  No       Capillary Blood Glucose: No results found for this or any previous visit (from the past 24 hour(s)).    Social History   Tobacco Use  Smoking Status Former Smoker  . Years: 20.00  . Types: Pipe, Cigars  . Last attempt to quit: 11/10/1968  . Years since quitting: 49.3  Smokeless Tobacco Never Used    Goals Met:  Independence with exercise equipment Improved SOB with ADL's Using PLB without cueing & demonstrates good technique Exercise tolerated well No report of cardiac concerns or symptoms Strength training completed today  Goals Unmet:  Not Applicable  Comments: Check out 230   Dr. Sinda Du is Medical Director for [facility] Pulmonary Re[facility].

## 2018-03-16 ENCOUNTER — Encounter (HOSPITAL_COMMUNITY)
Admission: RE | Admit: 2018-03-16 | Discharge: 2018-03-16 | Disposition: A | Discharge status: Home / Self Care | Payer: Medicare Other | Source: Ambulatory Visit | Attending: Pulmonary Disease | Admitting: Pulmonary Disease

## 2018-03-16 DIAGNOSIS — J849 Interstitial pulmonary disease, unspecified: Secondary | ICD-10-CM

## 2018-03-16 NOTE — Progress Notes (Signed)
Daily Session Note  Patient Details  Name: Manuel Atkins MRN: [id number] Date of Birth: 1933-03-28 Referring Provider:     PULMONARY REHAB OTHER RESP ORIENTATION from 03/03/2018 in [place]  Referring Provider  Dr. Vaughan Browner      Encounter Date: 03/16/2018  Check In: Session Check In - 03/16/18 1335      Check-In   Location  [facility]    Staff Present  Suzanne Boron, BS, EP, Exercise Physiologist;Diane Coad, MS, EP, [facility], Exercise Physiologist    Supervising physician immediately available to respond to emergencies  See telemetry face sheet for immediately available MD    Medication changes reported      No    Fall or balance concerns reported     Yes    Comments  Patient has a h/o of falls and reports balance issues.     Warm-up and Cool-down  Performed as group-led Higher education careers adviser Performed  Yes    VAD Patient?  No      Pain Assessment   Currently in Pain?  No/denies    Pain Score  0-No pain    Multiple Pain Sites  No       Capillary Blood Glucose: No results found for this or any previous visit (from the past 24 hour(s)).  Exercise Prescription Changes - 03/16/18 1000      Response to Exercise   Blood Pressure (Admit)  138/68    Blood Pressure (Exercise)  160/70    Blood Pressure (Exit)  144/70    Heart Rate (Admit)  91 bpm    Heart Rate (Exercise)  73 bpm    Heart Rate (Exit)  82 bpm    Oxygen Saturation (Admit)  97 %    Oxygen Saturation (Exercise)  98 %    Oxygen Saturation (Exit)  91 %    Rating of Perceived Exertion (Exercise)  12    Perceived Dyspnea (Exercise)  12    Duration  Progress to 30 minutes of  aerobic without signs/symptoms of physical distress    Intensity  THRR New 110-119-129      Progression   Progression  Continue to progress workloads to maintain intensity without signs/symptoms of physical distress.      Resistance Training   Training Prescription  Yes    Weight  1    Reps   10-15      Treadmill   MPH  1.3    Grade  0    Minutes  15    METs  1.9      NuStep   Level  1    SPM  95    Minutes  20    METs  1.9      Home Exercise Plan   Plans to continue exercise at  Home (comment)    Frequency  Add 2 additional days to program exercise sessions.    Initial Home Exercises Provided  03/11/18       Social History   Tobacco Use  Smoking Status Former Smoker  . Years: 20.00  . Types: Pipe, Cigars  . Last attempt to quit: 11/10/1968  . Years since quitting: 49.3  Smokeless Tobacco Never Used    Goals Met:  Independence with exercise equipment Improved SOB with ADL's Using PLB without cueing & demonstrates good technique Exercise tolerated well No report of cardiac concerns or symptoms Strength training completed today  Goals Unmet:  Not Applicable  Comments: Check  out 230   Dr. Sinda Du is Medical Director for [facility] Pulmonary Rehab.

## 2018-03-18 ENCOUNTER — Encounter (HOSPITAL_COMMUNITY)
Admission: RE | Admit: 2018-03-18 | Discharge: 2018-03-18 | Disposition: A | Discharge status: Home / Self Care | Payer: Medicare Other | Source: Ambulatory Visit | Attending: Pulmonary Disease | Admitting: Pulmonary Disease

## 2018-03-18 DIAGNOSIS — R5383 Other fatigue: Secondary | ICD-10-CM | POA: Diagnosis not present

## 2018-03-18 DIAGNOSIS — N183 Chronic kidney disease, stage 3 (moderate): Secondary | ICD-10-CM | POA: Diagnosis not present

## 2018-03-18 DIAGNOSIS — E1122 Type 2 diabetes mellitus with diabetic chronic kidney disease: Secondary | ICD-10-CM | POA: Diagnosis not present

## 2018-03-18 DIAGNOSIS — E1165 Type 2 diabetes mellitus with hyperglycemia: Secondary | ICD-10-CM | POA: Diagnosis not present

## 2018-03-18 DIAGNOSIS — R0609 Other forms of dyspnea: Secondary | ICD-10-CM | POA: Diagnosis not present

## 2018-03-18 DIAGNOSIS — E871 Hypo-osmolality and hyponatremia: Secondary | ICD-10-CM | POA: Diagnosis not present

## 2018-03-18 DIAGNOSIS — E782 Mixed hyperlipidemia: Secondary | ICD-10-CM | POA: Diagnosis not present

## 2018-03-18 DIAGNOSIS — J849 Interstitial pulmonary disease, unspecified: Secondary | ICD-10-CM | POA: Diagnosis not present

## 2018-03-18 DIAGNOSIS — I10 Essential (primary) hypertension: Secondary | ICD-10-CM | POA: Diagnosis not present

## 2018-03-18 DIAGNOSIS — M545 Low back pain: Secondary | ICD-10-CM | POA: Diagnosis not present

## 2018-03-18 DIAGNOSIS — K7581 Nonalcoholic steatohepatitis (NASH): Secondary | ICD-10-CM | POA: Diagnosis not present

## 2018-03-18 DIAGNOSIS — Z9189 Other specified personal risk factors, not elsewhere classified: Secondary | ICD-10-CM | POA: Diagnosis not present

## 2018-03-18 DIAGNOSIS — D696 Thrombocytopenia, unspecified: Secondary | ICD-10-CM | POA: Diagnosis not present

## 2018-03-18 DIAGNOSIS — E119 Type 2 diabetes mellitus without complications: Secondary | ICD-10-CM | POA: Diagnosis not present

## 2018-03-18 NOTE — Progress Notes (Signed)
Daily Session Note  Patient Details  Name: Manuel Atkins MRN: [id number] Date of Birth: 1933/08/11 Referring Provider:     PULMONARY REHAB OTHER RESP ORIENTATION from 03/03/2018 in [place]  Referring Provider  Dr. Vaughan Browner      Encounter Date: 03/18/2018  Check In: Session Check In - 03/18/18 1330      Check-In   Location  [facility]    Staff Present  Diane Angelina Pih, MS, EP, [facility], Exercise Physiologist;Ishmeal Rorie Wynetta Emery, RN, BSN    Supervising physician immediately available to respond to emergencies  See telemetry face sheet for immediately available MD    Medication changes reported      No    Fall or balance concerns reported     Yes    Comments  Patient has a h/o of falls and reports balance issues.     Warm-up and Cool-down  Performed as group-led Higher education careers adviser Performed  Yes    VAD Patient?  No      Pain Assessment   Currently in Pain?  No/denies    Pain Score  0-No pain    Multiple Pain Sites  No       Capillary Blood Glucose: No results found for this or any previous visit (from the past 24 hour(s)).    Social History   Tobacco Use  Smoking Status Former Smoker  . Years: 20.00  . Types: Pipe, Cigars  . Last attempt to quit: 11/10/1968  . Years since quitting: 49.3  Smokeless Tobacco Never Used    Goals Met:  Proper associated with RPD/PD & O2 Sat Independence with exercise equipment Improved SOB with ADL's Using PLB without cueing & demonstrates good technique Exercise tolerated well No report of cardiac concerns or symptoms Strength training completed today  Goals Unmet:  Not Applicable  Comments: Check out 1430.   Dr. Sinda Du is Medical Director for [facility].

## 2018-03-23 ENCOUNTER — Encounter (HOSPITAL_COMMUNITY): Payer: Medicare Other

## 2018-03-23 DIAGNOSIS — D696 Thrombocytopenia, unspecified: Secondary | ICD-10-CM | POA: Diagnosis not present

## 2018-03-23 DIAGNOSIS — E782 Mixed hyperlipidemia: Secondary | ICD-10-CM | POA: Diagnosis not present

## 2018-03-23 DIAGNOSIS — Z1389 Encounter for screening for other disorder: Secondary | ICD-10-CM | POA: Diagnosis not present

## 2018-03-23 DIAGNOSIS — K7581 Nonalcoholic steatohepatitis (NASH): Secondary | ICD-10-CM | POA: Diagnosis not present

## 2018-03-23 DIAGNOSIS — E1122 Type 2 diabetes mellitus with diabetic chronic kidney disease: Secondary | ICD-10-CM | POA: Diagnosis not present

## 2018-03-23 DIAGNOSIS — Z1331 Encounter for screening for depression: Secondary | ICD-10-CM | POA: Diagnosis not present

## 2018-03-23 DIAGNOSIS — Z0001 Encounter for general adult medical examination with abnormal findings: Secondary | ICD-10-CM | POA: Diagnosis not present

## 2018-03-23 DIAGNOSIS — Z6828 Body mass index (BMI) 28.0-28.9, adult: Secondary | ICD-10-CM | POA: Diagnosis not present

## 2018-03-23 DIAGNOSIS — I10 Essential (primary) hypertension: Secondary | ICD-10-CM | POA: Diagnosis not present

## 2018-03-25 ENCOUNTER — Encounter (HOSPITAL_COMMUNITY)
Admission: RE | Admit: 2018-03-25 | Discharge: 2018-03-25 | Disposition: A | Discharge status: Home / Self Care | Payer: Medicare Other | Source: Ambulatory Visit | Attending: Pulmonary Disease | Admitting: Pulmonary Disease

## 2018-03-25 DIAGNOSIS — B351 Tinea unguium: Secondary | ICD-10-CM | POA: Diagnosis not present

## 2018-03-25 DIAGNOSIS — J849 Interstitial pulmonary disease, unspecified: Secondary | ICD-10-CM

## 2018-03-25 DIAGNOSIS — L110 Acquired keratosis follicularis: Secondary | ICD-10-CM | POA: Diagnosis not present

## 2018-03-25 DIAGNOSIS — E1140 Type 2 diabetes mellitus with diabetic neuropathy, unspecified: Secondary | ICD-10-CM | POA: Diagnosis not present

## 2018-03-25 NOTE — Progress Notes (Signed)
Daily Session Note  Patient Details  Name: Manuel Atkins MRN: [id number] Date of Birth: 01/19/1933 Referring Provider:     PULMONARY REHAB OTHER RESP ORIENTATION from 03/03/2018 in [place]  Referring Provider  Dr. Vaughan Browner      Encounter Date: 03/25/2018  Check In: Session Check In - 03/25/18 1354      Check-In   Location  [facility]    Staff Present  Aundra Dubin, RN, BSN;Dorman Calderwood Luther Parody, BS, EP, Exercise Physiologist    Supervising physician immediately available to respond to emergencies  See telemetry face sheet for immediately available MD    Medication changes reported      No    Fall or balance concerns reported     Yes    Comments  Patient has a h/o of falls and reports balance issues.     Warm-up and Cool-down  Performed as group-led Higher education careers adviser Performed  Yes    VAD Patient?  No      Pain Assessment   Currently in Pain?  No/denies    Pain Score  0-No pain    Multiple Pain Sites  No       Capillary Blood Glucose: No results found for this or any previous visit (from the past 24 hour(s)).    Social History   Tobacco Use  Smoking Status Former Smoker  . Years: 20.00  . Types: Pipe, Cigars  . Last attempt to quit: 11/10/1968  . Years since quitting: 49.4  Smokeless Tobacco Never Used    Goals Met:  Independence with exercise equipment Improved SOB with ADL's Using PLB without cueing & demonstrates good technique Exercise tolerated well No report of cardiac concerns or symptoms Strength training completed today  Goals Unmet:  Not Applicable  Comments: Check out 230   Dr. Sinda Du is Medical Director for [facility].

## 2018-03-26 ENCOUNTER — Encounter: Payer: Self-pay | Admitting: Pulmonary Disease

## 2018-03-26 DIAGNOSIS — R0602 Shortness of breath: Secondary | ICD-10-CM | POA: Diagnosis not present

## 2018-03-26 DIAGNOSIS — I2510 Atherosclerotic heart disease of native coronary artery without angina pectoris: Secondary | ICD-10-CM | POA: Diagnosis not present

## 2018-03-30 ENCOUNTER — Encounter (HOSPITAL_COMMUNITY)
Admission: RE | Admit: 2018-03-30 | Discharge: 2018-03-30 | Disposition: A | Discharge status: Home / Self Care | Payer: Medicare Other | Source: Ambulatory Visit | Attending: Pulmonary Disease | Admitting: Pulmonary Disease

## 2018-03-30 DIAGNOSIS — J849 Interstitial pulmonary disease, unspecified: Secondary | ICD-10-CM | POA: Diagnosis not present

## 2018-03-30 NOTE — Progress Notes (Signed)
Daily Session Note  Patient Details  Name: Manuel Atkins MRN: [id number] Date of Birth: 1933/06/10 Referring Provider:     PULMONARY REHAB OTHER RESP ORIENTATION from 03/03/2018 in [place]  Referring Provider  Dr. Vaughan Browner      Encounter Date: 03/30/2018  Check In: Session Check In - 03/30/18 1338      Check-In   Location  [facility]    Staff Present  Aundra Dubin, RN, BSN;Ely Ballen Luther Parody, BS, EP, Exercise Physiologist;Diane Coad, MS, EP, [facility], Exercise Physiologist    Supervising physician immediately available to respond to emergencies  See telemetry face sheet for immediately available MD    Medication changes reported      No    Fall or balance concerns reported     Yes    Comments  Patient has a h/o of falls and reports balance issues.     Warm-up and Cool-down  Performed as group-led Higher education careers adviser Performed  Yes    VAD Patient?  No      Pain Assessment   Currently in Pain?  No/denies    Pain Score  0-No pain    Multiple Pain Sites  No       Capillary Blood Glucose: No results found for this or any previous visit (from the past 24 hour(s)).  Exercise Prescription Changes - 03/30/18 0700      Response to Exercise   Blood Pressure (Admit)  130/70    Blood Pressure (Exercise)  150/74    Blood Pressure (Exit)  134/68    Heart Rate (Admit)  84 bpm    Heart Rate (Exercise)  99 bpm    Heart Rate (Exit)  89 bpm    Oxygen Saturation (Admit)  93 %    Oxygen Saturation (Exercise)  94 %    Oxygen Saturation (Exit)  90 %    Rating of Perceived Exertion (Exercise)  12    Perceived Dyspnea (Exercise)  12    Duration  Progress to 30 minutes of  aerobic without signs/symptoms of physical distress    Intensity  THRR New [phone]      Progression   Progression  Continue to progress workloads to maintain intensity without signs/symptoms of physical distress.      Resistance Training   Training Prescription  Yes    Weight  2    Reps  10-15      Treadmill   MPH  1.5    Grade  0    Minutes  15    METs  2.15      NuStep   Level  1    SPM  92    Minutes  20    METs  1.8      Home Exercise Plan   Plans to continue exercise at  Home (comment)    Frequency  Add 2 additional days to program exercise sessions.    Initial Home Exercises Provided  03/11/18       Social History   Tobacco Use  Smoking Status Former Smoker  . Years: 20.00  . Types: Pipe, Cigars  . Last attempt to quit: 11/10/1968  . Years since quitting: 49.4  Smokeless Tobacco Never Used    Goals Met:  Independence with exercise equipment Improved SOB with ADL's Using PLB without cueing & demonstrates good technique Exercise tolerated well No report of cardiac concerns or symptoms Strength training completed today  Goals Unmet:  Not Applicable  Comments: Check out 230   Dr. Sinda Du is Medical Director for [facility].

## 2018-04-01 ENCOUNTER — Encounter (HOSPITAL_COMMUNITY)
Admission: RE | Admit: 2018-04-01 | Discharge: 2018-04-01 | Disposition: A | Discharge status: Home / Self Care | Payer: Medicare Other | Source: Ambulatory Visit | Attending: Pulmonary Disease | Admitting: Pulmonary Disease

## 2018-04-01 DIAGNOSIS — J849 Interstitial pulmonary disease, unspecified: Secondary | ICD-10-CM | POA: Diagnosis not present

## 2018-04-01 NOTE — Progress Notes (Signed)
Daily Session Note  Patient Details  Name: Manuel Atkins MRN: [id number] Date of Birth: 1933-06-16 Referring Provider:     PULMONARY REHAB OTHER RESP ORIENTATION from 03/03/2018 in [place]  Referring Provider  Dr. Vaughan Browner      Encounter Date: 04/01/2018  Check In: Session Check In - 04/01/18 1358      Check-In   Location  [facility]    Staff Present  Aundra Dubin, RN, BSN;Milburn Freeney Luther Parody, BS, EP, Exercise Physiologist;Diane Coad, MS, EP, [facility], Exercise Physiologist    Supervising physician immediately available to respond to emergencies  See telemetry face sheet for immediately available MD    Medication changes reported      No    Fall or balance concerns reported     Yes    Comments  Patient has a h/o of falls and reports balance issues.     Warm-up and Cool-down  Performed as group-led Higher education careers adviser Performed  Yes    VAD Patient?  No      Pain Assessment   Currently in Pain?  No/denies    Pain Score  0-No pain    Multiple Pain Sites  No       Capillary Blood Glucose: No results found for this or any previous visit (from the past 24 hour(s)).    Social History   Tobacco Use  Smoking Status Former Smoker  . Years: 20.00  . Types: Pipe, Cigars  . Last attempt to quit: 11/10/1968  . Years since quitting: 49.4  Smokeless Tobacco Never Used    Goals Met:  Independence with exercise equipment Improved SOB with ADL's Using PLB without cueing & demonstrates good technique Exercise tolerated well No report of cardiac concerns or symptoms Strength training completed today  Goals Unmet:  Not Applicable  Comments: Check out 230   Dr. Sinda Du is Medical Director for [facility].

## 2018-04-01 NOTE — Progress Notes (Signed)
Pulmonary Individual Treatment Plan  Patient Details  Name: Manuel Atkins MRN: [id number] Date of Birth: 04/24/1933 Referring Provider:     PULMONARY REHAB OTHER RESP ORIENTATION from 03/03/2018 in [place]  Referring Provider  Dr. Vaughan Browner      Initial Encounter Date:    PULMONARY REHAB OTHER RESP ORIENTATION from 03/03/2018 in [place]  Date  03/03/18  Referring Provider  Dr. Vaughan Browner      Visit Diagnosis: ILD (interstitial lung disease) ([place])  Patient's Home Medications on Admission:   Current Outpatient Medications:  .  ALPRAZolam (XANAX) 0.25 MG tablet, Take 1 tablet (0.25 mg total) by mouth 3 (three) times daily as needed. for anxiety, Disp: 90 tablet, Rfl: 2 .  aspirin EC 81 MG tablet, Take 81 mg by mouth daily., Disp: , Rfl:  .  clobetasol cream (TEMOVATE) 8.85 %, 1 application 2 (two) times daily as needed. , Disp: , Rfl: 3 .  imipramine (TOFRANIL) 25 MG tablet, TAKE  (3) TABLETS BY MOUTH AT BEDTIME; TITRATE AS DIRECTED, Disp: , Rfl: 0 .  ketoconazole (NIZORAL) 2 % cream, Apply 1 application topically 2 (two) times daily as needed for irritation. , Disp: , Rfl: 1 .  latanoprost (XALATAN) 0.005 % ophthalmic solution, Place 1 drop into both eyes at bedtime., Disp: , Rfl: 6 .  LIALDA 1.2 g EC tablet, TAKE TWO (2) TABLETS BY MOUTH TWICE DAILY, Disp: 360 tablet, Rfl: 4 .  losartan (COZAAR) 100 MG tablet, Take 100 mg by mouth daily., Disp: , Rfl:  .  metFORMIN (GLUCOPHAGE) 500 MG tablet, Take 1,000 mg by mouth every evening. , Disp: , Rfl:  .  pantoprazole (PROTONIX) 40 MG tablet, TAKE ONE TABLET BY MOUTH EVERY *OTHER* DAY, Disp: 30 tablet, Rfl: 5 .  Probiotic Product (PROBIOTIC DAILY PO), Take 1 capsule by mouth daily. , Disp: , Rfl:  .  sodium chloride 0.9 % SOLN with inFLIXimab 100 MG SOLR, Inject 5 mg/kg into the vein every 8 (eight) weeks. Last infusion was 07/08/13., Disp: , Rfl:  .  triamcinolone cream (KENALOG) 0.1 %, Apply 1  application topically as needed (irritation). , Disp: , Rfl:   Past Medical History: Past Medical History:  Diagnosis Date  . 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 Dyspnea   . Essential hypertension, benign   . GERD (gastroesophageal reflux disease)   . Headache(784.0)   . History of colon polyps   . History of transfusion of whole blood   . Interstitial lung disease ([place]) 12/01/2017  . Mixed hyperlipidemia   . Obstructive sleep apnea (adult) (pediatric)    uses bipap @ HS  . Other malaise and fatigue   . Thrombocytopenia ([place]) 11/10/2012  . Type II or unspecified type diabetes mellitus without mention of complication, uncontrolled   . 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 Years: 20.00  . Types: Pipe, Cigars  . Last attempt to quit: 11/10/1968  . Years since quitting: 49.4  Smokeless Tobacco Never Used    Labs: Recent Chemical engineer    Labs for ITP Cardiac and Pulmonary Rehab Latest Ref Rng & Units 11/07/2012   Hemoglobin A1c <5.7 % 6.2(H)      Capillary Blood Glucose: Lab Results  Component Value Date   GLUCAP 139 (H) 12/15/2017   GLUCAP 133 (H) 06/16/2013   GLUCAP 71 06/16/2013   GLUCAP 108 (H) 11/10/2012  GLUCAP 110 (H) 11/09/2012     Pulmonary Assessment Scores: Pulmonary Assessment Scores    Row Name 03/03/18 1440         ADL UCSD   ADL Phase  Entry     SOB Score total  69     Rest  0     Walk  6     Stairs  4     Bath  4     Dress  4     Shop  3       CAT Score   CAT Score  18       mMRC Score   mMRC Score  3        Pulmonary Function Assessment: Pulmonary Function Assessment - 03/03/18 1452      Pulmonary Function Tests   FVC%  58 %    FEV1%  67 %    FEV1/FVC Ratio  82    DLCO%  14.81 %      Post Bronchodilator Spirometry Results   FVC%  54 %    FEV1%  66 %    FEV1/FVC Ratio  86       Exercise Target Goals:    Exercise Program  Goal: Individual exercise prescription set using results from initial 6 min walk test and THRR while considering  patient's activity barriers and safety.   Exercise Prescription Goal: Initial exercise prescription builds to 30-45 minutes a day of aerobic activity, 2-3 days per week.  Home exercise guidelines will be given to patient during program as part of exercise prescription that the participant will acknowledge.  Activity Barriers & Risk Stratification: Activity Barriers & Cardiac Risk Stratification - 03/03/18 1405      Activity Barriers & Cardiac Risk Stratification   Activity Barriers  Shortness of Breath    Cardiac Risk Stratification  High       6 Minute Walk: 6 Minute Walk    Row Name 03/03/18 1404         6 Minute Walk   Phase  Initial     Distance  1100 feet     Distance % Change  0 %     Distance Feet Change  0 ft     Walk Time  6 minutes     # of Rest Breaks  0     MPH  2.08     METS  2.59     RPE  12     Perceived Dyspnea   14     VO2 Peak  7.16     Symptoms  No     Resting HR  77 bpm     Resting BP  154/74     Resting Oxygen Saturation   98 %     Exercise Oxygen Saturation  during 6 min walk  90 %     Max Ex. HR  79 bpm     Max Ex. BP  160/74     2 Minute Post BP  144/74        Oxygen Initial Assessment: Oxygen Initial Assessment - 03/03/18 1402      Home Oxygen   Home Oxygen Device  None    Sleep Oxygen Prescription  BiPAP    Home Exercise Oxygen Prescription  None    Home at Rest Exercise Oxygen Prescription  None      Initial 6 min Walk   Oxygen Used  None      Program Oxygen Prescription  Program Oxygen Prescription  None      Intervention   Short Term Goals  To learn and understand importance of monitoring SPO2 with pulse oximeter and demonstrate accurate use of the pulse oximeter.;To learn and understand importance of maintaining oxygen saturations>88%;To learn and demonstrate proper pursed lip breathing techniques or other  breathing techniques.    Long  Term Goals  Verbalizes importance of monitoring SPO2 with pulse oximeter and return demonstration;Maintenance of O2 saturations>88%;Exhibits proper breathing techniques, such as pursed lip breathing or other method taught during program session       Oxygen Re-Evaluation: Oxygen Re-Evaluation    Row Name 04/01/18 1553             Program Oxygen Prescription   Program Oxygen Prescription  None         Home Oxygen   Home Oxygen Device  None       Sleep Oxygen Prescription  BiPAP       Liters per minute  0       Home Exercise Oxygen Prescription  None       Home at Rest Exercise Oxygen Prescription  None         Goals/Expected Outcomes   Short Term Goals  To learn and understand importance of monitoring SPO2 with pulse oximeter and demonstrate accurate use of the pulse oximeter.;To learn and understand importance of maintaining oxygen saturations>88%;To learn and demonstrate proper pursed lip breathing techniques or other breathing techniques.       Long  Term Goals  Verbalizes importance of monitoring SPO2 with pulse oximeter and return demonstration;Maintenance of O2 saturations>88%;Exhibits proper breathing techniques, such as pursed lip breathing or other method taught during program session       Comments  Patient demonstrates proper pursed lip breathing techniques during exercise and also demonstrates proper usage of pulse oximeter. He is able to verbalize the importance of maintaining his O2 saturations >88%. Will continue to monitor.        Goals/Expected Outcomes  Patient will continue to meet his short and long term goals.           Oxygen Discharge (Final Oxygen Re-Evaluation): Oxygen Re-Evaluation - 04/01/18 1553      Program Oxygen Prescription   Program Oxygen Prescription  None      Home Oxygen   Home Oxygen Device  None    Sleep Oxygen Prescription  BiPAP    Liters per minute  0    Home Exercise Oxygen Prescription  None    Home  at Rest Exercise Oxygen Prescription  None      Goals/Expected Outcomes   Short Term Goals  To learn and understand importance of monitoring SPO2 with pulse oximeter and demonstrate accurate use of the pulse oximeter.;To learn and understand importance of maintaining oxygen saturations>88%;To learn and demonstrate proper pursed lip breathing techniques or other breathing techniques.    Long  Term Goals  Verbalizes importance of monitoring SPO2 with pulse oximeter and return demonstration;Maintenance of O2 saturations>88%;Exhibits proper breathing techniques, such as pursed lip breathing or other method taught during program session    Comments  Patient demonstrates proper pursed lip breathing techniques during exercise and also demonstrates proper usage of pulse oximeter. He is able to verbalize the importance of maintaining his O2 saturations >88%. Will continue to monitor.     Goals/Expected Outcomes  Patient will continue to meet his short and long term goals.        Initial Exercise Prescription: Initial Exercise  Prescription - 03/03/18 1400      Date of Initial Exercise RX and Referring Provider   Date  03/03/18    Referring Provider  Dr. Vaughan Browner      Treadmill   MPH  1.3    Grade  0    Minutes  15    METs  1.9      NuStep   Level  1    SPM  74    Minutes  20    METs  1.9      Prescription Details   Frequency (times per week)  3    Duration  Progress to 30 minutes of continuous aerobic without signs/symptoms of physical distress      Intensity   THRR 40-80% of Max Heartrate  101-113-126    Ratings of Perceived Exertion  11-13    Perceived Dyspnea  0-4      Progression   Progression  Continue progressive overload as per policy without signs/symptoms or physical distress.      Resistance Training   Training Prescription  Yes    Weight  1    Reps  10-15       Perform Capillary Blood Glucose checks as needed.  Exercise Prescription Changes:  Exercise Prescription  Changes    Row Name 03/11/18 1400 03/16/18 1000 03/30/18 0700         Response to Exercise   Blood Pressure (Admit)  -  138/68  130/70     Blood Pressure (Exercise)  -  160/70  150/74     Blood Pressure (Exit)  -  144/70  134/68     Heart Rate (Admit)  -  91 bpm  84 bpm     Heart Rate (Exercise)  -  73 bpm  99 bpm     Heart Rate (Exit)  -  82 bpm  89 bpm     Oxygen Saturation (Admit)  -  97 %  93 %     Oxygen Saturation (Exercise)  -  98 %  94 %     Oxygen Saturation (Exit)  -  91 %  90 %     Rating of Perceived Exertion (Exercise)  -  12  12     Perceived Dyspnea (Exercise)  -  12  12     Duration  -  Progress to 30 minutes of  aerobic without signs/symptoms of physical distress  Progress to 30 minutes of  aerobic without signs/symptoms of physical distress     Intensity  -  THRR New 110-119-129  THRR New 106-116-127       Progression   Progression  -  Continue to progress workloads to maintain intensity without signs/symptoms of physical distress.  Continue to progress workloads to maintain intensity without signs/symptoms of physical distress.       Resistance Training   Training Prescription  Yes  Yes  Yes     Weight  1  1  2      Reps  10-15  10-15  10-15       Treadmill   MPH  1.3  1.3  1.5     Grade  0  0  0     Minutes  15  15  15      METs  1.9  1.9  2.15       NuStep   Level  1  1  1      SPM  74  95  92  Minutes  20  20  20      METs  1.9  1.9  1.8       Home Exercise Plan   Plans to continue exercise at  Home (comment)  Home (comment)  Home (comment)     Frequency  Add 2 additional days to program exercise sessions.  Add 2 additional days to program exercise sessions.  Add 2 additional days to program exercise sessions.     Initial Home Exercises Provided  03/11/18  03/11/18  03/11/18        Exercise Comments:  Exercise Comments    Row Name 03/11/18 1448 03/16/18 1027 03/30/18 0748       Exercise Comments  Patient recieved the take home exercise plan  today 03/11/2018. THR was addressed as were safety guidelines for being active when not here in PR. Patient demonstrated an understanding and was encouraged to ask any future questions as they arise.   Patient has been doing well in PR. Patient has just started the program and is handling the initial exercise progression well. Patient will be progressed in time.   Patient is doing well in PR. Patient has increased his speed on the treadmill and his weights for the warm up portion of the session have increased to 2lbs. Patient has stated to me that he feels some more strength. Patient has still only been for PR for 6 sessions. Patient will be progressed more over time to meet his goals.          Exercise Goals and Review:  Exercise Goals    Row Name 03/03/18 1406             Exercise Goals   Increase Physical Activity  Yes       Intervention  Provide advice, education, support and counseling about physical activity/exercise needs.;Develop an individualized exercise prescription for aerobic and resistive training based on initial evaluation findings, risk stratification, comorbidities and participant's personal goals.       Expected Outcomes  Short Term: Attend rehab on a regular basis to increase amount of physical activity.       Increase Strength and Stamina  Yes       Intervention  Provide advice, education, support and counseling about physical activity/exercise needs.;Develop an individualized exercise prescription for aerobic and resistive training based on initial evaluation findings, risk stratification, comorbidities and participant's personal goals.       Expected Outcomes  Short Term: Increase workloads from initial exercise prescription for resistance, speed, and METs.       Able to understand and use rate of perceived exertion (RPE) scale  Yes       Intervention  Provide education and explanation on how to use RPE scale       Expected Outcomes  Short Term: Able to use RPE daily in  rehab to express subjective intensity level;Long Term:  Able to use RPE to guide intensity level when exercising independently       Able to understand and use Dyspnea scale  Yes       Intervention  Provide education and explanation on how to use Dyspnea scale       Expected Outcomes  Short Term: Able to use Dyspnea scale daily in rehab to express subjective sense of shortness of breath during exertion;Long Term: Able to use Dyspnea scale to guide intensity level when exercising independently       Knowledge and understanding of Target Heart Rate Range (THRR)  Yes  Intervention  Provide education and explanation of THRR including how the numbers were predicted and where they are located for reference       Expected Outcomes  Short Term: Able to state/look up THRR;Long Term: Able to use THRR to govern intensity when exercising independently;Short Term: Able to use daily as guideline for intensity in rehab       Able to check pulse independently  Yes       Intervention  Provide education and demonstration on how to check pulse in carotid and radial arteries.;Review the importance of being able to check your own pulse for safety during independent exercise       Expected Outcomes  Short Term: Able to explain why pulse checking is important during independent exercise;Long Term: Able to check pulse independently and accurately       Understanding of Exercise Prescription  Yes       Intervention  Provide education, explanation, and written materials on patient's individual exercise prescription       Expected Outcomes  Short Term: Able to explain program exercise prescription;Long Term: Able to explain home exercise prescription to exercise independently          Exercise Goals Re-Evaluation : Exercise Goals Re-Evaluation    Row Name 03/30/18 0746             Exercise Goal Re-Evaluation   Exercise Goals Review  Increase Physical Activity;Increase Strength and Stamina;Able to check pulse  independently;Able to understand and use rate of perceived exertion (RPE) scale;Knowledge and understanding of Target Heart Rate Range (THRR);Able to understand and use Dyspnea scale;Understanding of Exercise Prescription       Comments  Patient is doing well in PR. Patient has increased his speed on the treadmill and his weights for the warm up portion of the session have increased to 2lbs. Patient has stated to me that he feels some more strength. Patient has still only been for PR for 6 sessions. Patient will be progressed more over time to meet his goals.         Expected Outcomes  Patient wishes to lose weight, breathe better, and to gain strength.           Discharge Exercise Prescription (Final Exercise Prescription Changes): Exercise Prescription Changes - 03/30/18 0700      Response to Exercise   Blood Pressure (Admit)  130/70    Blood Pressure (Exercise)  150/74    Blood Pressure (Exit)  134/68    Heart Rate (Admit)  84 bpm    Heart Rate (Exercise)  99 bpm    Heart Rate (Exit)  89 bpm    Oxygen Saturation (Admit)  93 %    Oxygen Saturation (Exercise)  94 %    Oxygen Saturation (Exit)  90 %    Rating of Perceived Exertion (Exercise)  12    Perceived Dyspnea (Exercise)  12    Duration  Progress to 30 minutes of  aerobic without signs/symptoms of physical distress    Intensity  THRR New 106-116-127      Progression   Progression  Continue to progress workloads to maintain intensity without signs/symptoms of physical distress.      Resistance Training   Training Prescription  Yes    Weight  2    Reps  10-15      Treadmill   MPH  1.5    Grade  0    Minutes  15    METs  2.15  NuStep   Level  1    SPM  92    Minutes  20    METs  1.8      Home Exercise Plan   Plans to continue exercise at  Home (comment)    Frequency  Add 2 additional days to program exercise sessions.    Initial Home Exercises Provided  03/11/18       Nutrition:  Target Goals: Understanding  of nutrition guidelines, daily intake of sodium <1533m, cholesterol <2053m calories 30% from fat and 7% or less from saturated fats, daily to have 5 or more servings of fruits and vegetables.  Biometrics: Pre Biometrics - 03/03/18 1406      Pre Biometrics   Height  5' 11"  (1.803 m)    Weight  210 lb (95.3 kg)    Waist Circumference  42 inches    Hip Circumference  43 inches    Waist to Hip Ratio  0.98 %    BMI (Calculated)  29.3    Triceps Skinfold  9 mm    % Body Fat  27.2 %    Grip Strength  62.29 kg    Flexibility  0 in    Single Leg Stand  2 seconds        Nutrition Therapy Plan and Nutrition Goals: Nutrition Therapy & Goals - 03/11/18 1616      Personal Nutrition Goals   Nutrition Goal  For heart healthy choices add >50% of whole grains, make half their plate fruits and vegetables. Discuss the difference between starchy vegetables and leafy greens, and how leafy vegetables provide fiber, helps maintain healthy weight, helps control blood glucose, and lowers cholesterol.  Discuss purchasing fresh or frozen vegetable to reduce sodium and not to add grease, fat or sugar. Consume <18oz of red meat per week. Consume lean cuts of meats and very little of meats high in sodium and nitrates such as pork and lunch meats. Discussed portion control for all food groups.     Additional Goals?  No      Intervention Plan   Intervention  Nutrition handout(s) given to patient.    Expected Outcomes  Short Term Goal: Understand basic principles of dietary content, such as calories, fat, sodium, cholesterol and nutrients.       Nutrition Assessments: Nutrition Assessments - 03/03/18 1443      MEDFICTS Scores   Pre Score  36       Nutrition Goals Re-Evaluation: Nutrition Goals Re-Evaluation    Row Name 04/01/18 1555             Goals   Current Weight  207 lb 11.2 oz (94.2 kg)       Nutrition Goal  For heart healthy choices add >50% of whole grains, make half their plate fruits  and vegetables. Discuss the difference between starchy vegetables and leafy greens, and how leafy vegetables provide fiber, helps maintain healthy weight, helps control blood glucose, and lowers cholesterol.  Discuss purchasing fresh or frozen vegetable to reduce sodium and not to add grease, fat or sugar. Consume <18oz of red meat per week. Consume lean cuts of meats and very little of meats high in sodium and nitrates such as pork and lunch meats. Discussed portion control for all food groups.        Comment  Patient has lost 2.3 lbs since last 30 day review. He continues to say he is following a heart healthy diet. Will continue to monitor for progress.  Expected Outcome  Patient will continue to meet his nutrition goals.           Nutrition Goals Discharge (Final Nutrition Goals Re-Evaluation): Nutrition Goals Re-Evaluation - 04/01/18 1555      Goals   Current Weight  207 lb 11.2 oz (94.2 kg)    Nutrition Goal  For heart healthy choices add >50% of whole grains, make half their plate fruits and vegetables. Discuss the difference between starchy vegetables and leafy greens, and how leafy vegetables provide fiber, helps maintain healthy weight, helps control blood glucose, and lowers cholesterol.  Discuss purchasing fresh or frozen vegetable to reduce sodium and not to add grease, fat or sugar. Consume <18oz of red meat per week. Consume lean cuts of meats and very little of meats high in sodium and nitrates such as pork and lunch meats. Discussed portion control for all food groups.     Comment  Patient has lost 2.3 lbs since last 30 day review. He continues to say he is following a heart healthy diet. Will continue to monitor for progress.     Expected Outcome  Patient will continue to meet his nutrition goals.        Psychosocial: Target Goals: Acknowledge presence or absence of significant depression and/or stress, maximize coping skills, provide positive support system. Participant is  able to verbalize types and ability to use techniques and skills needed for reducing stress and depression.  Initial Review & Psychosocial Screening: Initial Psych Review & Screening - 03/03/18 1445      Initial Review   Current issues with  None Identified      Family Dynamics   Good Support System?  Yes    Comments  Patient initial QOL score was 27.44 and his PHQ-9 score was 7 with no psychosocial issues identified.       Barriers   Psychosocial barriers to participate in program  There are no identifiable barriers or psychosocial needs.      Screening Interventions   Interventions  Encouraged to exercise    Expected Outcomes  Short Term goal: Identification and review with participant of any Quality of Life or Depression concerns found by scoring the questionnaire.;Long Term goal: The participant improves quality of Life and PHQ9 Scores as seen by post scores and/or verbalization of changes       Quality of Life Scores: Quality of Life - 03/03/18 1239      Quality of Life Scores   Health/Function Pre  24.88 %    Socioeconomic Pre  29.58 %    Psych/Spiritual Pre  29.64 %    Family Pre  30 %    GLOBAL Pre  27.44 %      Scores of 19 and below usually indicate a poorer quality of life in these areas.  A difference of  2-3 points is a clinically meaningful difference.  A difference of 2-3 points in the total score of the Quality of Life Index has been associated with significant improvement in overall quality of life, self-image, physical symptoms, and general health in studies assessing change in quality of life.   PHQ-9: Recent Review Flowsheet Data    Depression screen [facility] 2/9 03/03/2018   Decreased Interest 0   Down, Depressed, Hopeless 0   PHQ - 2 Score 0   Altered sleeping 0   Tired, decreased energy 3   Change in appetite 3   Feeling bad or failure about yourself  0   Trouble concentrating 1  Moving slowly or fidgety/restless 0   Suicidal thoughts 0   PHQ-9 Score  7   Difficult doing work/chores Not difficult at all     Interpretation of Total Score  Total Score Depression Severity:  1-4 = Minimal depression, 5-9 = Mild depression, 10-14 = Moderate depression, 15-19 = Moderately severe depression, 20-27 = Severe depression   Psychosocial Evaluation and Intervention: Psychosocial Evaluation - 03/03/18 1446      Psychosocial Evaluation & Interventions   Interventions  Relaxation education;Stress management education;Encouraged to exercise with the program and follow exercise prescription    Comments  There are no psychosocial issues identified at orientation.     Expected Outcomes  Patient will have no psychosocial issues identified at discharge.     Continue Psychosocial Services   No Follow up required       Psychosocial Re-Evaluation: Psychosocial Re-Evaluation    [place] Name 04/01/18 1601             Psychosocial Re-Evaluation   Current issues with  None Identified       Comments  Patient's initial QOL score as 27.44 and his PHQ-9 score was 7 with no psychosocial issues identified.        Expected Outcomes  Patient will have no psychosocial issues identified at discharge.        Interventions  Relaxation education;Stress management education;Encouraged to attend Pulmonary Rehabilitation for the exercise       Continue Psychosocial Services   No Follow up required          Psychosocial Discharge (Final Psychosocial Re-Evaluation): Psychosocial Re-Evaluation - 04/01/18 1601      Psychosocial Re-Evaluation   Current issues with  None Identified    Comments  Patient's initial QOL score as 27.44 and his PHQ-9 score was 7 with no psychosocial issues identified.     Expected Outcomes  Patient will have no psychosocial issues identified at discharge.     Interventions  Relaxation education;Stress management education;Encouraged to attend Pulmonary Rehabilitation for the exercise    Continue Psychosocial Services   No Follow up required         Education: Education Goals: Education classes will be provided on a weekly basis, covering required topics. Participant will state understanding/return demonstration of topics presented.  Learning Barriers/Preferences: Learning Barriers/Preferences - 03/03/18 1439      Learning Barriers/Preferences   Learning Barriers  None    Learning Preferences  Skilled Demonstration;Written Material       Education Topics: How Lungs Work and Diseases: - Discuss the anatomy of the lungs and diseases that can affect the lungs, such as COPD.   PULMONARY REHAB OTHER RESPIRATORY from 04/01/2018 in [place]  Date  03/17/18  Educator  D. Coad  Instruction Review Code  2- Demonstrated Understanding      Exercise: -Discuss the importance of exercise, FITT principles of exercise, normal and abnormal responses to exercise, and how to exercise safely.   PULMONARY REHAB OTHER RESPIRATORY from 04/01/2018 in [place]  Date  03/11/18  Educator  [place]  Instruction Review Code  2- Demonstrated Understanding      Environmental Irritants: -Discuss types of environmental irritants and how to limit exposure to environmental irritants.   PULMONARY REHAB OTHER RESPIRATORY from 04/01/2018 in [place]  Date  03/25/18  Educator  [place]  Instruction Review Code  2- Demonstrated Understanding      Meds/Inhalers and oxygen: - Discuss respiratory medications, definition of an inhaler and  oxygen, and the proper way to use an inhaler and oxygen.   PULMONARY REHAB OTHER RESPIRATORY from 04/01/2018 in [place]  Date  04/01/18  Educator  [place]      Energy Saving Techniques: - Discuss methods to conserve energy and decrease shortness of breath when performing activities of daily living.    Bronchial Hygiene / Breathing Techniques: - Discuss breathing mechanics, pursed-lip breathing technique,  proper posture, effective  ways to clear airways, and other functional breathing techniques   Cleaning Equipment: - Provides group verbal and written instruction about the health risks of elevated stress, cause of high stress, and healthy ways to reduce stress.   Nutrition I: Fats: - Discuss the types of cholesterol, what cholesterol does to the body, and how cholesterol levels can be controlled.   Nutrition II: Labels: -Discuss the different components of food labels and how to read food labels.   Respiratory Infections: - Discuss the signs and symptoms of respiratory infections, ways to prevent respiratory infections, and the importance of seeking medical treatment when having a respiratory infection.   Stress I: Signs and Symptoms: - Discuss the causes of stress, how stress may lead to anxiety and depression, and ways to limit stress.   Stress II: Relaxation: -Discuss relaxation techniques to limit stress.   Oxygen for Home/Travel: - Discuss how to prepare for travel when on oxygen and proper ways to transport and store oxygen to ensure safety.   Knowledge Questionnaire Score: Knowledge Questionnaire Score - 03/03/18 1439      Knowledge Questionnaire Score   Pre Score  12/18       Core Components/Risk Factors/Patient Goals at Admission: Personal Goals and Risk Factors at Admission - 03/03/18 1443      Core Components/Risk Factors/Patient Goals on Admission    Weight Management  Yes    Admit Weight  210 lb (95.3 kg)    Goal Weight: Short Term  205 lb (93 kg)    Goal Weight: Long Term  200 lb (90.7 kg)    Expected Outcomes  Short Term: Continue to assess and modify interventions until short term weight is achieved;Long Term: Adherence to nutrition and physical activity/exercise program aimed toward attainment of established weight goal    Improve shortness of breath with ADL's  Yes    Intervention  Provide education, individualized exercise plan and daily activity instruction to help decrease  symptoms of SOB with activities of daily living.    Expected Outcomes  Short Term: Improve cardiorespiratory fitness to achieve a reduction of symptoms when performing ADLs;Long Term: Be able to perform more ADLs without symptoms or delay the onset of symptoms    Diabetes  Yes    Intervention  Provide education about signs/symptoms and action to take for hypo/hyperglycemia.;Provide education about proper nutrition, including hydration, and aerobic/resistive exercise prescription along with prescribed medications to achieve blood glucose in normal ranges: Fasting glucose 65-99 mg/dL    Expected Outcomes  Long Term: Attainment of HbA1C < 7%.    Personal Goal Other  Yes    Personal Goal  Improve DM control; breathe better; increase strength.     Intervention  Patient will attend PR 2 days/week and supplement with exercise 3 days/week.     Expected Outcomes  Patient will meet his personal goals.        Core Components/Risk Factors/Patient Goals Review:  Goals and Risk Factor Review    Row Name 04/01/18 1557  Core Components/Risk Factors/Patient Goals Review   Personal Goals Review  Weight Management/Obesity;Improve shortness of breath with ADL's;Increase knowledge of respiratory medications and ability to use respiratory devices properly.;Diabetes Breathe better; Do ADL's without SOB; get stronger; lose 10 lbs.        Review  Patient has completed 8 sessions losing 2.3 lbs since his initial visit. Patient says he can not see any improvement since he started the program. He is being evaluated by a cardiologist and says a stress test has been planned. He is not sure when. He thinks maybe something going on with his heart could be contributing to his SOB. He has no recent Hgb A1C labs showing. He does not take his glucose often, so he does not always report a reading. Will continue to monitor for progress.        Expected Outcomes  Patient will continue to attend sessions and complete the  program meeting his personal goals.           Core Components/Risk Factors/Patient Goals at Discharge (Final Review):  Goals and Risk Factor Review - 04/01/18 1557      Core Components/Risk Factors/Patient Goals Review   Personal Goals Review  Weight Management/Obesity;Improve shortness of breath with ADL's;Increase knowledge of respiratory medications and ability to use respiratory devices properly.;Diabetes Breathe better; Do ADL's without SOB; get stronger; lose 10 lbs.     Review  Patient has completed 8 sessions losing 2.3 lbs since his initial visit. Patient says he can not see any improvement since he started the program. He is being evaluated by a cardiologist and says a stress test has been planned. He is not sure when. He thinks maybe something going on with his heart could be contributing to his SOB. He has no recent Hgb A1C labs showing. He does not take his glucose often, so he does not always report a reading. Will continue to monitor for progress.     Expected Outcomes  Patient will continue to attend sessions and complete the program meeting his personal goals.        ITP Comments: ITP Comments    Row Name 03/10/18 1457 03/11/18 1616         ITP Comments  Patient new to the program completing 2 sessions. Will continue to monitor for progress.   Patient attend the Family Matters class with hosptial chaplian to discuss how this event has impacted their life.          Comments: ITP 30 Day REVIEW Pt is making expected progress toward pulmonary rehab goals after completing 8 sessions. Recommend continued exercise, life style modification, education, and utilization of breathing techniques to increase stamina and strength and decrease shortness of breath with exertion.

## 2018-04-02 ENCOUNTER — Encounter (HOSPITAL_COMMUNITY)
Admission: RE | Admit: 2018-04-02 | Discharge: 2018-04-02 | Disposition: A | Discharge status: Home / Self Care | Payer: Medicare Other | Source: Ambulatory Visit | Attending: Internal Medicine | Admitting: Internal Medicine

## 2018-04-02 DIAGNOSIS — K5190 Ulcerative colitis, unspecified, without complications: Secondary | ICD-10-CM | POA: Insufficient documentation

## 2018-04-02 DIAGNOSIS — I2510 Atherosclerotic heart disease of native coronary artery without angina pectoris: Secondary | ICD-10-CM | POA: Diagnosis not present

## 2018-04-02 DIAGNOSIS — E119 Type 2 diabetes mellitus without complications: Secondary | ICD-10-CM | POA: Diagnosis not present

## 2018-04-02 DIAGNOSIS — I10 Essential (primary) hypertension: Secondary | ICD-10-CM | POA: Diagnosis not present

## 2018-04-02 DIAGNOSIS — R0609 Other forms of dyspnea: Secondary | ICD-10-CM | POA: Diagnosis not present

## 2018-04-02 MED ORDER — SODIUM CHLORIDE 0.9 % IV SOLN
Freq: Once | INTRAVENOUS | Status: AC
  Administered 2018-04-02: 250 mL via INTRAVENOUS

## 2018-04-02 MED ORDER — SODIUM CHLORIDE 0.9 % IV SOLN
5.0000 mg/kg | INTRAVENOUS | Status: AC
Start: 2018-04-02 — End: 2018-04-02
  Administered 2018-04-02: 500 mg via INTRAVENOUS
  Filled 2018-04-02: qty 50

## 2018-04-02 NOTE — Progress Notes (Signed)
Pt states he took his tylenol, benadryl and zyrtec at home before coming to hospital.

## 2018-04-06 ENCOUNTER — Encounter (HOSPITAL_COMMUNITY)
Admission: RE | Admit: 2018-04-06 | Discharge: 2018-04-06 | Disposition: A | Discharge status: Home / Self Care | Payer: Medicare Other | Source: Ambulatory Visit | Attending: Pulmonary Disease | Admitting: Pulmonary Disease

## 2018-04-06 DIAGNOSIS — J849 Interstitial pulmonary disease, unspecified: Secondary | ICD-10-CM | POA: Diagnosis not present

## 2018-04-06 NOTE — Progress Notes (Signed)
Daily Session Note  Patient Details  Name: Manuel Atkins MRN: [id number] Date of Birth: [birth year] Referring Provider:     PULMONARY REHAB OTHER RESP ORIENTATION from 03/03/2018 in [place]  Referring Provider  Dr. Vaughan Browner      Encounter Date: 04/06/2018  Check In: Session Check In - 04/06/18 1413      Check-In   Location  [facility]    Staff Present  Suzanne Boron, BS, EP, Exercise Physiologist;Diane Coad, MS, EP, [facility], Exercise Physiologist    Supervising physician immediately available to respond to emergencies  See telemetry face sheet for immediately available MD    Medication changes reported      No    Fall or balance concerns reported     Yes    Comments  Patient has a h/o of falls and reports balance issues.     Warm-up and Cool-down  Performed as group-led Higher education careers adviser Performed  Yes    VAD Patient?  No      Pain Assessment   Currently in Pain?  No/denies    Pain Score  0-No pain    Multiple Pain Sites  No       Capillary Blood Glucose: No results found for this or any previous visit (from the past 24 hour(s)).    Social History   Tobacco Use  Smoking Status Former Smoker  . Years: 20.00  . Types: Pipe, Cigars  . Last attempt to quit: 11/10/1968  . Years since quitting: 49.4  Smokeless Tobacco Never Used    Goals Met:  Independence with exercise equipment Improved SOB with ADL's Using PLB without cueing & demonstrates good technique Exercise tolerated well No report of cardiac concerns or symptoms Strength training completed today  Goals Unmet:  Not Applicable  Comments: Check out 230   Dr. Sinda Du is Medical Director for [facility] Pulmonary Rehab.

## 2018-04-08 ENCOUNTER — Encounter (HOSPITAL_COMMUNITY)
Admission: RE | Admit: 2018-04-08 | Discharge: 2018-04-08 | Disposition: A | Discharge status: Home / Self Care | Payer: Medicare Other | Source: Ambulatory Visit | Attending: Pulmonary Disease | Admitting: Pulmonary Disease

## 2018-04-08 DIAGNOSIS — J849 Interstitial pulmonary disease, unspecified: Secondary | ICD-10-CM

## 2018-04-08 NOTE — Progress Notes (Signed)
Daily Session Note  Patient Details  Name: Manuel Atkins MRN: [id number] Date of Birth: 1933/08/03 Referring Provider:     PULMONARY REHAB OTHER RESP ORIENTATION from 03/03/2018 in [place]  Referring Provider  Dr. Vaughan Browner      Encounter Date: 04/08/2018  Check In: Session Check In - 04/08/18 1425      Check-In   Location  [facility]    Staff Present  Suzanne Boron, BS, EP, Exercise Physiologist;Diane Coad, MS, EP, [facility], Exercise Physiologist;Debra Wynetta Emery, RN, BSN    Supervising physician immediately available to respond to emergencies  See telemetry face sheet for immediately available MD    Medication changes reported      Yes    Comments  Patient was placed on amlodipine and crestor by order of his doctor     Fall or balance concerns reported     No    Warm-up and Cool-down  Performed as group-led instruction    Resistance Training Performed  Yes    VAD Patient?  No      Pain Assessment   Currently in Pain?  No/denies    Pain Score  0-No pain    Multiple Pain Sites  No       Capillary Blood Glucose: No results found for this or any previous visit (from the past 24 hour(s)).    Social History   Tobacco Use  Smoking Status Former Smoker  . Years: 20.00  . Types: Pipe, Cigars  . Last attempt to quit: 11/10/1968  . Years since quitting: 49.4  Smokeless Tobacco Never Used    Goals Met:  Independence with exercise equipment Improved SOB with ADL's Using PLB without cueing & demonstrates good technique Exercise tolerated well No report of cardiac concerns or symptoms Strength training completed today  Goals Unmet:  Not Applicable  Comments: Check out 230   Dr. Sinda Du is Medical Director for [facility] Pulmonary Rehab.

## 2018-04-13 ENCOUNTER — Encounter (HOSPITAL_COMMUNITY)
Admission: RE | Admit: 2018-04-13 | Discharge: 2018-04-13 | Disposition: A | Discharge status: Home / Self Care | Payer: Medicare Other | Source: Ambulatory Visit | Attending: Pulmonary Disease | Admitting: Pulmonary Disease

## 2018-04-13 DIAGNOSIS — J849 Interstitial pulmonary disease, unspecified: Secondary | ICD-10-CM

## 2018-04-13 NOTE — Progress Notes (Signed)
Daily Session Note  Patient Details  Name: Manuel Atkins MRN: [id number] Date of Birth: [birth year] Referring Provider:     PULMONARY REHAB OTHER RESP ORIENTATION from 03/03/2018 in [place]  Referring Provider  Dr. Vaughan Browner      Encounter Date: 04/13/2018  Check In: Session Check In - 04/13/18 1423      Check-In   Location  [facility]    Staff Present  Suzanne Boron, BS, EP, Exercise Physiologist;Diane Coad, MS, EP, [facility], Exercise Physiologist    Supervising physician immediately available to respond to emergencies  See telemetry face sheet for immediately available MD    Medication changes reported      No    Fall or balance concerns reported     No    Warm-up and Cool-down  Performed as group-led instruction    Resistance Training Performed  Yes    VAD Patient?  No      Pain Assessment   Currently in Pain?  No/denies    Pain Score  0-No pain    Multiple Pain Sites  No       Capillary Blood Glucose: No results found for this or any previous visit (from the past 24 hour(s)).    Social History   Tobacco Use  Smoking Status Former Smoker  . Years: 20.00  . Types: Pipe, Cigars  . Last attempt to quit: 11/10/1968  . Years since quitting: 49.4  Smokeless Tobacco Never Used    Goals Met:  Independence with exercise equipment Improved SOB with ADL's Using PLB without cueing & demonstrates good technique Exercise tolerated well Personal goals reviewed No report of cardiac concerns or symptoms Strength training completed today  Goals Unmet:  Not Applicable  Comments: Check out 230   Dr. Sinda Du is Medical Director for [facility] Pulmonary Rehab.

## 2018-04-15 ENCOUNTER — Encounter (HOSPITAL_COMMUNITY)
Admission: RE | Admit: 2018-04-15 | Discharge: 2018-04-15 | Disposition: A | Discharge status: Home / Self Care | Payer: Medicare Other | Source: Ambulatory Visit | Attending: Pulmonary Disease | Admitting: Pulmonary Disease

## 2018-04-15 DIAGNOSIS — I209 Angina pectoris, unspecified: Secondary | ICD-10-CM | POA: Diagnosis not present

## 2018-04-15 DIAGNOSIS — J849 Interstitial pulmonary disease, unspecified: Secondary | ICD-10-CM | POA: Diagnosis not present

## 2018-04-15 NOTE — Progress Notes (Signed)
Daily Session Note  Patient Details  Name: Manuel Atkins MRN: [id number] Date of Birth: May 10, 1933 Referring Provider:     PULMONARY REHAB OTHER RESP ORIENTATION from 03/03/2018 in [place]  Referring Provider  Dr. Vaughan Browner      Encounter Date: 04/15/2018  Check In: Session Check In - 04/15/18 1330      Check-In   Location  [facility]    Staff Present  Aundra Dubin, RN, BSN;Diane Coad, MS, EP, [facility], Exercise Physiologist    Supervising physician immediately available to respond to emergencies  See telemetry face sheet for immediately available MD    Medication changes reported      No    Fall or balance concerns reported     Yes    Comments  Patient has a h/o of falls and reports balance issues.     Warm-up and Cool-down  Performed as group-led Higher education careers adviser Performed  Yes    VAD Patient?  No      Pain Assessment   Currently in Pain?  No/denies    Pain Score  0-No pain    Multiple Pain Sites  No       Capillary Blood Glucose: No results found for this or any previous visit (from the past 24 hour(s)).    Social History   Tobacco Use  Smoking Status Former Smoker  . Years: 20.00  . Types: Pipe, Cigars  . Last attempt to quit: 11/10/1968  . Years since quitting: 49.4  Smokeless Tobacco Never Used    Goals Met:  Proper associated with RPD/PD & O2 Sat Independence with exercise equipment Improved SOB with ADL's Using PLB without cueing & demonstrates good technique Exercise tolerated well No report of cardiac concerns or symptoms Strength training completed today  Goals Unmet:  Not Applicable  Comments: Check out 1430   Dr. Sinda Du is Medical Director for [facility] Pulmonary Rehab.

## 2018-04-17 DIAGNOSIS — R079 Chest pain, unspecified: Secondary | ICD-10-CM
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
## 2018-04-17 NOTE — Progress Notes (Signed)
Labs 04/14/2018: H/H 14/41. MCV 94. Platelets 164 Glucose 206. BUN/Cr 28/1.4. eGFR 46. Na/K 139/4.7

## 2018-04-19 DIAGNOSIS — H40012 Open angle with borderline findings, low risk, left eye: Secondary | ICD-10-CM | POA: Diagnosis not present

## 2018-04-19 DIAGNOSIS — H401211 Low-tension glaucoma, right eye, mild stage: Secondary | ICD-10-CM | POA: Diagnosis not present

## 2018-04-20 ENCOUNTER — Ambulatory Visit (HOSPITAL_COMMUNITY)
Admission: RE | Admit: 2018-04-20 | Discharge: 2018-04-20 | Disposition: A | Discharge status: Home / Self Care | Payer: Medicare Other | Source: Ambulatory Visit | Attending: Cardiology | Admitting: Cardiology

## 2018-04-20 ENCOUNTER — Encounter (HOSPITAL_COMMUNITY): Payer: Medicare Other

## 2018-04-20 ENCOUNTER — Ambulatory Visit (HOSPITAL_COMMUNITY)
Admission: RE | Disposition: A | Discharge status: Home / Self Care | Payer: Self-pay | Source: Ambulatory Visit | Attending: Cardiology

## 2018-04-20 DIAGNOSIS — R0609 Other forms of dyspnea: Secondary | ICD-10-CM | POA: Insufficient documentation

## 2018-04-20 DIAGNOSIS — Z8249 Family history of ischemic heart disease and other diseases of the circulatory system: Secondary | ICD-10-CM | POA: Diagnosis not present

## 2018-04-20 DIAGNOSIS — I2510 Atherosclerotic heart disease of native coronary artery without angina pectoris: Secondary | ICD-10-CM | POA: Diagnosis not present

## 2018-04-20 DIAGNOSIS — Z9049 Acquired absence of other specified parts of digestive tract: Secondary | ICD-10-CM | POA: Diagnosis not present

## 2018-04-20 DIAGNOSIS — E119 Type 2 diabetes mellitus without complications: Secondary | ICD-10-CM | POA: Diagnosis not present

## 2018-04-20 DIAGNOSIS — I10 Essential (primary) hypertension: Secondary | ICD-10-CM | POA: Insufficient documentation

## 2018-04-20 DIAGNOSIS — J849 Interstitial pulmonary disease, unspecified: Secondary | ICD-10-CM | POA: Insufficient documentation

## 2018-04-20 DIAGNOSIS — Z87891 Personal history of nicotine dependence: Secondary | ICD-10-CM | POA: Insufficient documentation

## 2018-04-20 DIAGNOSIS — Z888 Allergy status to other drugs, medicaments and biological substances status: Secondary | ICD-10-CM | POA: Insufficient documentation

## 2018-04-20 DIAGNOSIS — R079 Chest pain, unspecified: Secondary | ICD-10-CM

## 2018-04-20 DIAGNOSIS — Z9889 Other specified postprocedural states: Secondary | ICD-10-CM | POA: Insufficient documentation

## 2018-04-20 DIAGNOSIS — I700 Atherosclerosis of aorta: Secondary | ICD-10-CM | POA: Diagnosis not present

## 2018-04-20 DIAGNOSIS — I440 Atrioventricular block, first degree: Secondary | ICD-10-CM | POA: Insufficient documentation

## 2018-04-20 DIAGNOSIS — R9439 Abnormal result of other cardiovascular function study: Secondary | ICD-10-CM | POA: Diagnosis not present

## 2018-04-20 DIAGNOSIS — R9431 Abnormal electrocardiogram [ECG] [EKG]: Secondary | ICD-10-CM | POA: Diagnosis not present

## 2018-04-20 HISTORY — PX: RIGHT/LEFT HEART CATH AND CORONARY ANGIOGRAPHY: CATH118266

## 2018-04-20 LAB — POCT I-STAT 3, VENOUS BLOOD GAS (G3P V)
Acid-base deficit: 4 mmol/L — ABNORMAL HIGH (ref 0.0–2.0)
Bicarbonate: 22.3 mmol/L (ref 20.0–28.0)
O2 Saturation: 63 %
PH VEN: 7.293 (ref 7.250–7.430)
PO2 VEN: 37 mmHg (ref 32.0–45.0)
TCO2: 24 mmol/L (ref 22–32)
pCO2, Ven: 46 mmHg (ref 44.0–60.0)

## 2018-04-20 LAB — POCT I-STAT 3, ART BLOOD GAS (G3+)
Acid-base deficit: 5 mmol/L — ABNORMAL HIGH (ref 0.0–2.0)
Bicarbonate: 21 mmol/L (ref 20.0–28.0)
O2 Saturation: 93 %
PCO2 ART: 42.2 mmHg (ref 32.0–48.0)
PH ART: 7.304 — AB (ref 7.350–7.450)
PO2 ART: 72 mmHg — AB (ref 83.0–108.0)
TCO2: 22 mmol/L (ref 22–32)

## 2018-04-20 LAB — GLUCOSE, CAPILLARY
GLUCOSE-CAPILLARY: 140 mg/dL — AB (ref 65–99)
Glucose-Capillary: 206 mg/dL — ABNORMAL HIGH (ref 65–99)
Glucose-Capillary: 168 mg/dL — ABNORMAL HIGH (ref 65–99)

## 2018-04-20 LAB — POCT ACTIVATED CLOTTING TIME: Activated Clotting Time: 202 seconds

## 2018-04-20 SURGERY — RIGHT/LEFT HEART CATH AND CORONARY ANGIOGRAPHY
Anesthesia: LOCAL

## 2018-04-20 MED ORDER — VERAPAMIL HCL 2.5 MG/ML IV SOLN
INTRAVENOUS | Status: AC
  Filled 2018-04-20: qty 2

## 2018-04-20 MED ORDER — SODIUM CHLORIDE 0.9 % IV SOLN: 250.0000 mL | INTRAVENOUS | Status: DC | PRN

## 2018-04-20 MED ORDER — LIDOCAINE HCL (PF) 1 % IJ SOLN
INTRAMUSCULAR | Status: DC | PRN
  Administered 2018-04-20: 2 mL via INTRADERMAL
  Administered 2018-04-20: 20 mL via INTRADERMAL
  Administered 2018-04-20: 2 mL via INTRADERMAL

## 2018-04-20 MED ORDER — FENTANYL CITRATE (PF) 100 MCG/2ML IJ SOLN
INTRAMUSCULAR | Status: AC
  Filled 2018-04-20: qty 2

## 2018-04-20 MED ORDER — ONDANSETRON HCL 4 MG/2ML IJ SOLN: 4.0000 mg | Freq: Four times a day (QID) | INTRAMUSCULAR | Status: DC | PRN

## 2018-04-20 MED ORDER — SODIUM CHLORIDE 0.9% FLUSH: 3.0000 mL | INTRAVENOUS | Status: DC | PRN

## 2018-04-20 MED ORDER — HEPARIN (PORCINE) IN NACL 2-0.9 UNITS/ML
INTRAMUSCULAR | Status: AC | PRN
  Administered 2018-04-20 (×2): 500 mL via INTRA_ARTERIAL

## 2018-04-20 MED ORDER — HEPARIN SODIUM (PORCINE) 1000 UNIT/ML IJ SOLN
INTRAMUSCULAR | Status: AC
  Filled 2018-04-20: qty 1

## 2018-04-20 MED ORDER — FENTANYL CITRATE (PF) 100 MCG/2ML IJ SOLN
INTRAMUSCULAR | Status: DC | PRN
  Administered 2018-04-20: 50 ug via INTRAVENOUS
  Administered 2018-04-20: 25 ug via INTRAVENOUS

## 2018-04-20 MED ORDER — SODIUM CHLORIDE 0.9 % IV SOLN: INTRAVENOUS | Status: DC

## 2018-04-20 MED ORDER — ASPIRIN 81 MG PO CHEW: 81.0000 mg | CHEWABLE_TABLET | ORAL | Status: DC

## 2018-04-20 MED ORDER — SODIUM CHLORIDE 0.9 % IV SOLN
INTRAVENOUS | Status: AC
  Administered 2018-04-20: 09:00:00 via INTRAVENOUS

## 2018-04-20 MED ORDER — HEPARIN (PORCINE) IN NACL 1000-0.9 UT/500ML-% IV SOLN
INTRAVENOUS | Status: AC
  Filled 2018-04-20: qty 1000

## 2018-04-20 MED ORDER — LIDOCAINE HCL (PF) 1 % IJ SOLN
INTRAMUSCULAR | Status: AC
  Filled 2018-04-20: qty 30

## 2018-04-20 MED ORDER — IOHEXOL 350 MG/ML SOLN
INTRAVENOUS | Status: DC | PRN
  Administered 2018-04-20: 90 mL via INTRAVENOUS

## 2018-04-20 MED ORDER — SODIUM CHLORIDE 0.9% FLUSH: 3.0000 mL | Freq: Two times a day (BID) | INTRAVENOUS | Status: DC

## 2018-04-20 MED ORDER — ACETAMINOPHEN 325 MG PO TABS: 650.0000 mg | ORAL_TABLET | ORAL | Status: DC | PRN

## 2018-04-20 MED ORDER — VERAPAMIL HCL 2.5 MG/ML IV SOLN
INTRAVENOUS | Status: DC | PRN
  Administered 2018-04-20 (×2): 5 mL via INTRA_ARTERIAL

## 2018-04-20 MED ORDER — MIDAZOLAM HCL 2 MG/2ML IJ SOLN
INTRAMUSCULAR | Status: AC
  Filled 2018-04-20: qty 2

## 2018-04-20 MED ORDER — MIDAZOLAM HCL 2 MG/2ML IJ SOLN
INTRAMUSCULAR | Status: DC | PRN
  Administered 2018-04-20 (×2): 1 mg via INTRAVENOUS

## 2018-04-20 MED ORDER — HEPARIN SODIUM (PORCINE) 1000 UNIT/ML IJ SOLN
INTRAMUSCULAR | Status: DC | PRN
  Administered 2018-04-20: 5000 [IU] via INTRAVENOUS

## 2018-04-20 SURGICAL SUPPLY — 22 items
CATH 5FR JL3.5 JR4 ANG PIG MP (CATHETERS) ×2 IMPLANT
CATH BALLN WEDGE 5F 110CM (CATHETERS) ×2 IMPLANT
CATH INFINITI 5FR AL1 (CATHETERS) ×2 IMPLANT
CATH INFINITI 5FR JL4 (CATHETERS) ×2 IMPLANT
COVER PRB 48X5XTLSCP FOLD TPE (BAG) ×2 IMPLANT
COVER PROBE 5X48 (BAG) ×2
DEVICE CLOSURE MYNXGRIP 5F (Vascular Products) ×2 IMPLANT
DEVICE RAD COMP TR BAND LRG (VASCULAR PRODUCTS) ×2 IMPLANT
GUIDEWIRE INQWIRE 1.5J.035X260 (WIRE) ×1 IMPLANT
INQWIRE 1.5J .035X260CM (WIRE) ×2
KIT HEART LEFT (KITS) ×2 IMPLANT
PACK CARDIAC CATHETERIZATION (CUSTOM PROCEDURE TRAY) ×2 IMPLANT
SET INTRODUCER MICROPUNCT 5F (INTRODUCER) ×2 IMPLANT
SHEATH PINNACLE 5F 10CM (SHEATH) ×2 IMPLANT
SHEATH RAIN 4/5FR (SHEATH) ×2 IMPLANT
SHEATH RAIN RADIAL 21G 6FR (SHEATH) ×2 IMPLANT
SYR MEDRAD MARK V 150ML (SYRINGE) ×2 IMPLANT
TRANSDUCER W/STOPCOCK (MISCELLANEOUS) ×2 IMPLANT
TUBING CIL FLEX 10 FLL-RA (TUBING) ×2 IMPLANT
WIRE EMERALD 3MM-J .025X260CM (WIRE) ×2 IMPLANT
WIRE EMERALD 3MM-J .035X150CM (WIRE) ×2 IMPLANT
WIRE MICROINTRODUCER 60CM (WIRE) ×2 IMPLANT

## 2018-04-20 NOTE — Interval H&P Note (Signed)
History and Physical Interval Note:  04/20/2018 2:10 PM  Manuel Atkins  has presented today for surgery, with the diagnosis of Chest Pain  The various methods of treatment have been discussed with the patient and family. After consideration of risks, benefits and other options for treatment, the patient has consented to  Procedure(s): RIGHT/LEFT HEART CATH AND CORONARY ANGIOGRAPHY (N/A) as a surgical intervention .  The patient's history has been reviewed, patient examined, no change in status, stable for surgery.  I have reviewed the patient's chart and labs.  Questions were answered to the patient's satisfaction.     2012 Appropriate Use Criteria for Diagnostic Catheterization Home / Select Test of Interest Indication for RHC Pulmonary Hypertension Pulmonary Hypertension (Right Heart Catheterization)  Pulmonary Hypertension  (Right Heart Catheterization) Link Here: [email or web address] Indication:  1. Suspected pulmonary artery hypertension 2. Equivocal or borderline elevated estimated right ventricular systolic pressure on resting echo study A (7) Indication: 97; Score 7     Sharrieff Spratlin J Sherrilyn Nairn

## 2018-04-20 NOTE — Research (Signed)
CADFEM Informed Consent   Subject Name: Manuel Atkins  Subject met inclusion and exclusion criteria.  The informed consent form, study requirements and expectations were reviewed with the subject and questions and concerns were addressed prior to the signing of the consent form.  The subject verbalized understanding of the trail requirements.  The subject agreed to participate in the CADFEM trial and signed the informed consent.  The informed consent was obtained prior to performance of any protocol-specific procedures for the subject.  A copy of the signed informed consent was given to the subject and a copy was placed in the subject's medical record.  Christena Flake 04/20/2018, 09:40 AM

## 2018-04-20 NOTE — Discharge Instructions (Signed)
NO METFORMIN/GLUCOPHAGE FOR 2 DAYS   Femoral Site Care Refer to this sheet in the next few weeks. These instructions provide you with information about caring for yourself after your procedure. Your health care provider may also give you more specific instructions. Your treatment has been planned according to current medical practices, but problems sometimes occur. Call your health care provider if you have any problems or questions after your procedure. What can I expect after the procedure? After your procedure, it is typical to have the following:  Bruising at the site that usually fades within 1-2 weeks.  Blood collecting in the tissue (hematoma) that may be painful to the touch. It should usually decrease in size and tenderness within 1-2 weeks.  Follow these instructions at home:  Take medicines only as directed by your health care provider.  You may shower 24-48 hours after the procedure or as directed by your health care provider. Remove the bandage (dressing) and gently wash the site with plain soap and water. Pat the area dry with a clean towel. Do not rub the site, because this may cause bleeding.  Do not take baths, swim, or use a hot tub until your health care provider approves.  Check your insertion site every day for redness, swelling, or drainage.  Do not apply powder or lotion to the site.  Limit use of stairs to twice a day for the first 2-3 days or as directed by your health care provider.  Do not squat for the first 2-3 days or as directed by your health care provider.  Do not lift over 10 lb (4.5 kg) for 5 days after your procedure or as directed by your health care provider.  Ask your health care provider when it is okay to: ? Return to work or school. ? Resume usual physical activities or sports. ? Resume sexual activity.  Do not drive home if you are discharged the same day as the procedure. Have someone else drive you.  You may drive 24 hours after the  procedure unless otherwise instructed by your health care provider.  Do not operate machinery or power tools for 24 hours after the procedure or as directed by your health care provider.  If your procedure was done as an outpatient procedure, which means that you went home the same day as your procedure, a responsible adult should be with you for the first 24 hours after you arrive home.  Keep all follow-up visits as directed by your health care provider. This is important. Contact a health care provider if:  You have a fever.  You have chills.  You have increased bleeding from the site. Hold pressure on the site. Get help right away if:  You have unusual pain at the site.  You have redness, warmth, or swelling at the site.  You have drainage (other than a small amount of blood on the dressing) from the site.  The site is bleeding, and the bleeding does not stop after 30 minutes of holding steady pressure on the site.  Your leg or foot becomes pale, cool, tingly, or numb. This information is not intended to replace advice given to you by your health care provider. Make sure you discuss any questions you have with your health care provider. Document Released: 06/30/2014 Document Revised: 04/03/2016 Document Reviewed: 05/16/2014 Elsevier Interactive Patient Education  2018 [place] Refer to this sheet in the next few weeks. These instructions provide you with information  about caring for yourself after your procedure. Your health care provider may also give you more specific instructions. Your treatment has been planned according to current medical practices, but problems sometimes occur. Call your health care provider if you have any problems or questions after your procedure. What can I expect after the procedure? After your procedure, it is typical to have the following:  Bruising at the radial site that usually fades within 1-2 weeks.  Blood  collecting in the tissue (hematoma) that may be painful to the touch. It should usually decrease in size and tenderness within 1-2 weeks.  Follow these instructions at home:  Take medicines only as directed by your health care provider.  You may shower 24-48 hours after the procedure or as directed by your health care provider. Remove the bandage (dressing) and gently wash the site with plain soap and water. Pat the area dry with a clean towel. Do not rub the site, because this may cause bleeding.  Do not take baths, swim, or use a hot tub until your health care provider approves.  Check your insertion site every day for redness, swelling, or drainage.  Do not apply powder or lotion to the site.  Do not flex or bend the affected arm for 24 hours or as directed by your health care provider.  Do not push or pull heavy objects with the affected arm for 24 hours or as directed by your health care provider.  Do not lift over 10 lb (4.5 kg) for 5 days after your procedure or as directed by your health care provider.  Ask your health care provider when it is okay to: ? Return to work or school. ? Resume usual physical activities or sports. ? Resume sexual activity.  Do not drive home if you are discharged the same day as the procedure. Have someone else drive you.  You may drive 24 hours after the procedure unless otherwise instructed by your health care provider.  Do not operate machinery or power tools for 24 hours after the procedure.  If your procedure was done as an outpatient procedure, which means that you went home the same day as your procedure, a responsible adult should be with you for the first 24 hours after you arrive home.  Keep all follow-up visits as directed by your health care provider. This is important. Contact a health care provider if:  You have a fever.  You have chills.  You have increased bleeding from the radial site. Hold pressure on the site. Get help  right away if:  You have unusual pain at the radial site.  You have redness, warmth, or swelling at the radial site.  You have drainage (other than a small amount of blood on the dressing) from the radial site.  The radial site is bleeding, and the bleeding does not stop after 30 minutes of holding steady pressure on the site.  Your arm or hand becomes pale, cool, tingly, or numb. This information is not intended to replace advice given to you by your health care provider. Make sure you discuss any questions you have with your health care provider. Document Released: 11/29/2010 Document Revised: 04/03/2016 Document Reviewed: 05/15/2014 Elsevier Interactive Patient Education  2018 [employer].

## 2018-04-20 NOTE — Progress Notes (Addendum)
Up and walked and tolerated well; right groin stable no bleeding or hematoma; right brachial site no bleeding or hematoma; right radial with slight swelling; however, soft and no bleeding noted; I called cath lab staff member to check right radial with me and she agreed no bleeding or hematoma

## 2018-04-20 NOTE — Progress Notes (Signed)
On arrival from cath lab swelling noted at tr band and Elmo Putt from cath lab held pressure and moved tr band, swelling decreased

## 2018-04-21 ENCOUNTER — Encounter (HOSPITAL_COMMUNITY): Payer: Self-pay | Admitting: Cardiology

## 2018-04-21 MED FILL — Heparin Sod (Porcine)-NaCl IV Soln 1000 Unit/500ML-0.9%: INTRAVENOUS | Qty: 1000 | Status: AC

## 2018-04-22 ENCOUNTER — Encounter (HOSPITAL_COMMUNITY): Payer: Medicare Other

## 2018-04-23 NOTE — Progress Notes (Signed)
Pulmonary Individual Treatment Plan  Patient Details  Name: Manuel Atkins MRN: [id number] Date of Birth: 1933-08-14 Referring Provider:     PULMONARY REHAB OTHER RESP ORIENTATION from 03/03/2018 in [facility]  Referring Provider  Dr. Vaughan Browner      Initial Encounter Date:    PULMONARY REHAB OTHER RESP ORIENTATION from 03/03/2018 in [facility]  Date  03/03/18  Referring Provider  Dr. Vaughan Browner      Visit Diagnosis: ILD (interstitial lung disease) ([place])  Patient's Home Medications on Admission:   Current Outpatient Medications:  .  acetaminophen (TYLENOL) 500 MG tablet, Take 1,000 mg by mouth every 6 (six) hours as needed for moderate pain or headache., Disp: , Rfl:  .  ALPRAZolam (XANAX) 0.25 MG tablet, Take 1 tablet (0.25 mg total) by mouth 3 (three) times daily as needed. for anxiety (Patient taking differently: Take 0.125 mg by mouth 3 (three) times daily as needed for anxiety. ), Disp: 90 tablet, Rfl: 2 .  amLODipine (NORVASC) 2.5 MG tablet, Take 2.5 mg by mouth daily., Disp: , Rfl:  .  aspirin EC 81 MG tablet, Take 81 mg by mouth daily., Disp: , Rfl:  .  imipramine (TOFRANIL) 25 MG tablet, Take 75 mg by mouth at bedtime, Disp: , Rfl: 0 .  latanoprost (XALATAN) 0.005 % ophthalmic solution, Place 1 drop into both eyes at bedtime., Disp: , Rfl: 6 .  LIALDA 1.2 g EC tablet, TAKE TWO (2) TABLETS BY MOUTH TWICE DAILY, Disp: 360 tablet, Rfl: 4 .  losartan (COZAAR) 100 MG tablet, Take 100 mg by mouth daily. , Disp: , Rfl:  .  metFORMIN (GLUCOPHAGE-XR) 500 MG 24 hr tablet, Take 1,000 mg by mouth at bedtime., Disp: , Rfl: 1 .  nitroGLYCERIN (NITROSTAT) 0.4 MG SL tablet, Place 0.4 mg under the tongue every 5 (five) minutes as needed for chest pain. , Disp: , Rfl: 1 .  pantoprazole (PROTONIX) 40 MG tablet, TAKE ONE TABLET BY MOUTH EVERY *OTHER* DAY, Disp: 30 tablet, Rfl: 5 .  Probiotic Product (PROBIOTIC DAILY PO), Take 1 capsule by mouth daily. , Disp: ,  Rfl:  .  rosuvastatin (CRESTOR) 20 MG tablet, Take 20 mg by mouth at bedtime., Disp: , Rfl:  .  tamsulosin (FLOMAX) 0.4 MG CAPS capsule, Take 0.4 mg by mouth at bedtime., Disp: , Rfl:   Past Medical History: Past Medical History:  Diagnosis Date  . 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 Dyspnea   . Essential hypertension, benign   . GERD (gastroesophageal reflux disease)   . Headache(784.0)   . History of colon polyps   . History of transfusion of whole blood   . Interstitial lung disease ([place]) 12/01/2017  . Mixed hyperlipidemia   . Obstructive sleep apnea (adult) (pediatric)    uses bipap @ HS  . Other malaise and fatigue   . Thrombocytopenia ([place]) 11/10/2012  . Type II or unspecified type diabetes mellitus without mention of complication, uncontrolled   . 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 Years: 20.00  . Types: Pipe, Cigars  . Last attempt to quit: 11/10/1968  . Years since quitting: 49.4  Smokeless Tobacco Never Used    Labs: Recent Chemical engineer    Labs for ITP Cardiac and Pulmonary Rehab Latest Ref Rng & Units 11/07/2012 04/20/2018 04/20/2018   Hemoglobin A1c <5.7 % 6.2(H) - -   PHART 7.350 -  7.450 - - 7.304(L)   PCO2ART 32.0 - 48.0 mmHg - - 42.2   HCO3 20.0 - 28.0 mmol/L - 22.3 21.0   TCO2 22 - 32 mmol/L - 24 22   ACIDBASEDEF 0.0 - 2.0 mmol/L - 4.0(H) 5.0(H)   O2SAT % - 63.0 93.0      Capillary Blood Glucose: Lab Results  Component Value Date   GLUCAP 140 (H) 04/20/2018   GLUCAP 168 (H) 04/20/2018   GLUCAP 206 (H) 04/20/2018   GLUCAP 139 (H) 12/15/2017   GLUCAP 133 (H) 06/16/2013     Pulmonary Assessment Scores: Pulmonary Assessment Scores    Row Name 03/03/18 1440         ADL UCSD   ADL Phase  Entry     SOB Score total  69     Rest  0     Walk  6     Stairs  4     Bath  4     Dress  4     Shop  3       CAT Score   CAT Score  18       mMRC Score   mMRC  Score  3        Pulmonary Function Assessment: Pulmonary Function Assessment - 03/03/18 1452      Pulmonary Function Tests   FVC%  58 %    FEV1%  67 %    FEV1/FVC Ratio  82    DLCO%  14.81 %      Post Bronchodilator Spirometry Results   FVC%  54 %    FEV1%  66 %    FEV1/FVC Ratio  86       Exercise Target Goals:    Exercise Program Goal: Individual exercise prescription set using results from initial 6 min walk test and THRR while considering  patient's activity barriers and safety.   Exercise Prescription Goal: Initial exercise prescription builds to 30-45 minutes a day of aerobic activity, 2-3 days per week.  Home exercise guidelines will be given to patient during program as part of exercise prescription that the participant will acknowledge.  Activity Barriers & Risk Stratification: Activity Barriers & Cardiac Risk Stratification - 03/03/18 1405      Activity Barriers & Cardiac Risk Stratification   Activity Barriers  Shortness of Breath    Cardiac Risk Stratification  High       6 Minute Walk: 6 Minute Walk    Row Name 03/03/18 1404         6 Minute Walk   Phase  Initial     Distance  1100 feet     Distance % Change  0 %     Distance Feet Change  0 ft     Walk Time  6 minutes     # of Rest Breaks  0     MPH  2.08     METS  2.59     RPE  12     Perceived Dyspnea   14     VO2 Peak  7.16     Symptoms  No     Resting HR  77 bpm     Resting BP  154/74     Resting Oxygen Saturation   98 %     Exercise Oxygen Saturation  during 6 min walk  90 %     Max Ex. HR  79 bpm     Max Ex. BP  160/74  2 Minute Post BP  144/74        Oxygen Initial Assessment: Oxygen Initial Assessment - 03/03/18 1402      Home Oxygen   Home Oxygen Device  None    Sleep Oxygen Prescription  BiPAP    Home Exercise Oxygen Prescription  None    Home at Rest Exercise Oxygen Prescription  None      Initial 6 min Walk   Oxygen Used  None      Program Oxygen Prescription    Program Oxygen Prescription  None      Intervention   Short Term Goals  To learn and understand importance of monitoring SPO2 with pulse oximeter and demonstrate accurate use of the pulse oximeter.;To learn and understand importance of maintaining oxygen saturations>88%;To learn and demonstrate proper pursed lip breathing techniques or other breathing techniques.    Long  Term Goals  Verbalizes importance of monitoring SPO2 with pulse oximeter and return demonstration;Maintenance of O2 saturations>88%;Exhibits proper breathing techniques, such as pursed lip breathing or other method taught during program session       Oxygen Re-Evaluation: Oxygen Re-Evaluation    Row Name 04/01/18 1553 04/23/18 1457           Program Oxygen Prescription   Program Oxygen Prescription  None  None        Home Oxygen   Home Oxygen Device  None  None      Sleep Oxygen Prescription  BiPAP  BiPAP      Liters per minute  0  0      Home Exercise Oxygen Prescription  None  None      Home at Rest Exercise Oxygen Prescription  None  None        Goals/Expected Outcomes   Short Term Goals  To learn and understand importance of monitoring SPO2 with pulse oximeter and demonstrate accurate use of the pulse oximeter.;To learn and understand importance of maintaining oxygen saturations>88%;To learn and demonstrate proper pursed lip breathing techniques or other breathing techniques.  To learn and understand importance of monitoring SPO2 with pulse oximeter and demonstrate accurate use of the pulse oximeter.;To learn and understand importance of maintaining oxygen saturations>88%;To learn and demonstrate proper pursed lip breathing techniques or other breathing techniques.      Long  Term Goals  Verbalizes importance of monitoring SPO2 with pulse oximeter and return demonstration;Maintenance of O2 saturations>88%;Exhibits proper breathing techniques, such as pursed lip breathing or other method taught during program  session  Verbalizes importance of monitoring SPO2 with pulse oximeter and return demonstration;Maintenance of O2 saturations>88%;Exhibits proper breathing techniques, such as pursed lip breathing or other method taught during program session      Comments  Patient demonstrates proper pursed lip breathing techniques during exercise and also demonstrates proper usage of pulse oximeter. He is able to verbalize the importance of maintaining his O2 saturations >88%. Will continue to monitor.   Patient demonstrates proper pursed lip breathing techniques during exercise and also demonstrates proper usage of pulse oximeter. He is able to verbalize the importance of maintaining his O2 saturations >88%. Will continue to monitor.       Goals/Expected Outcomes  Patient will continue to meet his short and long term goals.   Patient will continue to meet his short and long term goals.          Oxygen Discharge (Final Oxygen Re-Evaluation): Oxygen Re-Evaluation - 04/23/18 1457      Program Oxygen Prescription   Program Oxygen Prescription  None      Home Oxygen   Home Oxygen Device  None    Sleep Oxygen Prescription  BiPAP    Liters per minute  0    Home Exercise Oxygen Prescription  None    Home at Rest Exercise Oxygen Prescription  None      Goals/Expected Outcomes   Short Term Goals  To learn and understand importance of monitoring SPO2 with pulse oximeter and demonstrate accurate use of the pulse oximeter.;To learn and understand importance of maintaining oxygen saturations>88%;To learn and demonstrate proper pursed lip breathing techniques or other breathing techniques.    Long  Term Goals  Verbalizes importance of monitoring SPO2 with pulse oximeter and return demonstration;Maintenance of O2 saturations>88%;Exhibits proper breathing techniques, such as pursed lip breathing or other method taught during program session    Comments  Patient demonstrates proper pursed lip breathing techniques during  exercise and also demonstrates proper usage of pulse oximeter. He is able to verbalize the importance of maintaining his O2 saturations >88%. Will continue to monitor.     Goals/Expected Outcomes  Patient will continue to meet his short and long term goals.        Initial Exercise Prescription: Initial Exercise Prescription - 03/03/18 1400      Date of Initial Exercise RX and Referring Provider   Date  03/03/18    Referring Provider  Dr. Vaughan Browner      Treadmill   MPH  1.3    Grade  0    Minutes  15    METs  1.9      NuStep   Level  1    SPM  74    Minutes  20    METs  1.9      Prescription Details   Frequency (times per week)  3    Duration  Progress to 30 minutes of continuous aerobic without signs/symptoms of physical distress      Intensity   THRR 40-80% of Max Heartrate  101-113-126    Ratings of Perceived Exertion  11-13    Perceived Dyspnea  0-4      Progression   Progression  Continue progressive overload as per policy without signs/symptoms or physical distress.      Resistance Training   Training Prescription  Yes    Weight  1    Reps  10-15       Perform Capillary Blood Glucose checks as needed.  Exercise Prescription Changes:  Exercise Prescription Changes    Row Name 03/11/18 1400 03/16/18 1000 03/30/18 0700 04/12/18 1400       Response to Exercise   Blood Pressure (Admit)  -  138/68  130/70  130/60    Blood Pressure (Exercise)  -  160/70  150/74  150/68    Blood Pressure (Exit)  -  144/70  134/68  128/66    Heart Rate (Admit)  -  91 bpm  84 bpm  91 bpm    Heart Rate (Exercise)  -  73 bpm  99 bpm  91 bpm    Heart Rate (Exit)  -  82 bpm  89 bpm  91 bpm    Oxygen Saturation (Admit)  -  97 %  93 %  91 %    Oxygen Saturation (Exercise)  -  98 %  94 %  90 %    Oxygen Saturation (Exit)  -  91 %  90 %  97 %    Rating of Perceived Exertion (  Exercise)  -  12  12  11     Perceived Dyspnea (Exercise)  -  12  12  11     Duration  -  Progress to 30 minutes of   aerobic without signs/symptoms of physical distress  Progress to 30 minutes of  aerobic without signs/symptoms of physical distress  Progress to 30 minutes of  aerobic without signs/symptoms of physical distress    Intensity  -  THRR New 110-119-129  THRR New 106-116-127  THRR New 110-119-129      Progression   Progression  -  Continue to progress workloads to maintain intensity without signs/symptoms of physical distress.  Continue to progress workloads to maintain intensity without signs/symptoms of physical distress.  Continue to progress workloads to maintain intensity without signs/symptoms of physical distress.      Resistance Training   Training Prescription  Yes  Yes  Yes  Yes    Weight  1  1  2  2     Reps  10-15  10-15  10-15  10-15      Treadmill   MPH  1.3  1.3  1.5  1.7    Grade  0  0  0  0    Minutes  15  15  15  15     METs  1.9  1.9  2.15  2.3      NuStep   Level  1  1  1  2     SPM  74  95  92  125    Minutes  20  20  20  20     METs  1.9  1.9  1.8  1.8      Home Exercise Plan   Plans to continue exercise at  Home (comment)  Home (comment)  Home (comment)  Home (comment)    Frequency  Add 2 additional days to program exercise sessions.  Add 2 additional days to program exercise sessions.  Add 2 additional days to program exercise sessions.  Add 2 additional days to program exercise sessions.    Initial Home Exercises Provided  03/11/18  03/11/18  03/11/18  03/11/18       Exercise Comments:  Exercise Comments    Row Name 03/11/18 1448 03/16/18 1027 03/30/18 0748 04/12/18 1449     Exercise Comments  Patient recieved the take home exercise plan today 03/11/2018. THR was addressed as were safety guidelines for being active when not here in PR. Patient demonstrated an understanding and was encouraged to ask any future questions as they arise.   Patient has been doing well in PR. Patient has just started the program and is handling the initial exercise progression well.  Patient will be progressed in time.   Patient is doing well in PR. Patient has increased his speed on the treadmill and his weights for the warm up portion of the session have increased to 2lbs. Patient has stated to me that he feels some more strength. Patient has still only been for PR for 6 sessions. Patient will be progressed more over time to meet his goals.    Patient is doing well in PR. Patient has increased his level on the nustep machine and increased his speed on the treadmill to 1.7. Patient states that he feels more energy to do tasks around the house.        Exercise Goals and Review:  Exercise Goals    Row Name 03/03/18 1406  Exercise Goals   Increase Physical Activity  Yes       Intervention  Provide advice, education, support and counseling about physical activity/exercise needs.;Develop an individualized exercise prescription for aerobic and resistive training based on initial evaluation findings, risk stratification, comorbidities and participant's personal goals.       Expected Outcomes  Short Term: Attend rehab on a regular basis to increase amount of physical activity.       Increase Strength and Stamina  Yes       Intervention  Provide advice, education, support and counseling about physical activity/exercise needs.;Develop an individualized exercise prescription for aerobic and resistive training based on initial evaluation findings, risk stratification, comorbidities and participant's personal goals.       Expected Outcomes  Short Term: Increase workloads from initial exercise prescription for resistance, speed, and METs.       Able to understand and use rate of perceived exertion (RPE) scale  Yes       Intervention  Provide education and explanation on how to use RPE scale       Expected Outcomes  Short Term: Able to use RPE daily in rehab to express subjective intensity level;Long Term:  Able to use RPE to guide intensity level when exercising independently        Able to understand and use Dyspnea scale  Yes       Intervention  Provide education and explanation on how to use Dyspnea scale       Expected Outcomes  Short Term: Able to use Dyspnea scale daily in rehab to express subjective sense of shortness of breath during exertion;Long Term: Able to use Dyspnea scale to guide intensity level when exercising independently       Knowledge and understanding of Target Heart Rate Range (THRR)  Yes       Intervention  Provide education and explanation of THRR including how the numbers were predicted and where they are located for reference       Expected Outcomes  Short Term: Able to state/look up THRR;Long Term: Able to use THRR to govern intensity when exercising independently;Short Term: Able to use daily as guideline for intensity in rehab       Able to check pulse independently  Yes       Intervention  Provide education and demonstration on how to check pulse in carotid and radial arteries.;Review the importance of being able to check your own pulse for safety during independent exercise       Expected Outcomes  Short Term: Able to explain why pulse checking is important during independent exercise;Long Term: Able to check pulse independently and accurately       Understanding of Exercise Prescription  Yes       Intervention  Provide education, explanation, and written materials on patient's individual exercise prescription       Expected Outcomes  Short Term: Able to explain program exercise prescription;Long Term: Able to explain home exercise prescription to exercise independently          Exercise Goals Re-Evaluation : Exercise Goals Re-Evaluation    Row Name 03/30/18 0746 04/20/18 0831           Exercise Goal Re-Evaluation   Exercise Goals Review  Increase Physical Activity;Increase Strength and Stamina;Able to check pulse independently;Able to understand and use rate of perceived exertion (RPE) scale;Knowledge and understanding of Target  Heart Rate Range (THRR);Able to understand and use Dyspnea scale;Understanding of Exercise Prescription  Increase Physical Activity;Increase Strength  and Stamina;Able to check pulse independently;Able to understand and use rate of perceived exertion (RPE) scale;Knowledge and understanding of Target Heart Rate Range (THRR);Able to understand and use Dyspnea scale;Understanding of Exercise Prescription      Comments  Patient is doing well in PR. Patient has increased his speed on the treadmill and his weights for the warm up portion of the session have increased to 2lbs. Patient has stated to me that he feels some more strength. Patient has still only been for PR for 6 sessions. Patient will be progressed more over time to meet his goals.    Patient has been doing very well in PR. Patient has stated to me that he feels much more energy and strength from the program. Patient has become less SOB he states. Patient has been doing well on his nustep and has been progressed to level 2 on the machine.       Expected Outcomes  Patient wishes to lose weight, breathe better, and to gain strength.   Patient wishes to lose weight, breathe better, and to gain strength.          Discharge Exercise Prescription (Final Exercise Prescription Changes): Exercise Prescription Changes - 04/12/18 1400      Response to Exercise   Blood Pressure (Admit)  130/60    Blood Pressure (Exercise)  150/68    Blood Pressure (Exit)  128/66    Heart Rate (Admit)  91 bpm    Heart Rate (Exercise)  91 bpm    Heart Rate (Exit)  91 bpm    Oxygen Saturation (Admit)  91 %    Oxygen Saturation (Exercise)  90 %    Oxygen Saturation (Exit)  97 %    Rating of Perceived Exertion (Exercise)  11    Perceived Dyspnea (Exercise)  11    Duration  Progress to 30 minutes of  aerobic without signs/symptoms of physical distress    Intensity  THRR New 110-119-129      Progression   Progression  Continue to progress workloads to maintain intensity  without signs/symptoms of physical distress.      Resistance Training   Training Prescription  Yes    Weight  2    Reps  10-15      Treadmill   MPH  1.7    Grade  0    Minutes  15    METs  2.3      NuStep   Level  2    SPM  125    Minutes  20    METs  1.8      Home Exercise Plan   Plans to continue exercise at  Home (comment)    Frequency  Add 2 additional days to program exercise sessions.    Initial Home Exercises Provided  03/11/18       Nutrition:  Target Goals: Understanding of nutrition guidelines, daily intake of sodium <1528m, cholesterol <2055m calories 30% from fat and 7% or less from saturated fats, daily to have 5 or more servings of fruits and vegetables.  Biometrics: Pre Biometrics - 03/03/18 1406      Pre Biometrics   Height  5' 11"  (1.803 m)    Weight  210 lb (95.3 kg)    Waist Circumference  42 inches    Hip Circumference  43 inches    Waist to Hip Ratio  0.98 %    BMI (Calculated)  29.3    Triceps Skinfold  9 mm    %  Body Fat  27.2 %    Grip Strength  62.29 kg    Flexibility  0 in    Single Leg Stand  2 seconds        Nutrition Therapy Plan and Nutrition Goals: Nutrition Therapy & Goals - 03/11/18 1616      Personal Nutrition Goals   Nutrition Goal  For heart healthy choices add >50% of whole grains, make half their plate fruits and vegetables. Discuss the difference between starchy vegetables and leafy greens, and how leafy vegetables provide fiber, helps maintain healthy weight, helps control blood glucose, and lowers cholesterol.  Discuss purchasing fresh or frozen vegetable to reduce sodium and not to add grease, fat or sugar. Consume <18oz of red meat per week. Consume lean cuts of meats and very little of meats high in sodium and nitrates such as pork and lunch meats. Discussed portion control for all food groups.     Additional Goals?  No      Intervention Plan   Intervention  Nutrition handout(s) given to patient.    Expected  Outcomes  Short Term Goal: Understand basic principles of dietary content, such as calories, fat, sodium, cholesterol and nutrients.       Nutrition Assessments: Nutrition Assessments - 03/03/18 1443      MEDFICTS Scores   Pre Score  36       Nutrition Goals Re-Evaluation: Nutrition Goals Re-Evaluation    [place] Name 04/01/18 1555 04/23/18 1458           Goals   Current Weight  207 lb 11.2 oz (94.2 kg)  207 lb 12.8 oz (94.3 kg)      Nutrition Goal  For heart healthy choices add >50% of whole grains, make half their plate fruits and vegetables. Discuss the difference between starchy vegetables and leafy greens, and how leafy vegetables provide fiber, helps maintain healthy weight, helps control blood glucose, and lowers cholesterol.  Discuss purchasing fresh or frozen vegetable to reduce sodium and not to add grease, fat or sugar. Consume <18oz of red meat per week. Consume lean cuts of meats and very little of meats high in sodium and nitrates such as pork and lunch meats. Discussed portion control for all food groups.   For heart healthy choices add >50% of whole grains, make half their plate fruits and vegetables. Discuss the difference between starchy vegetables and leafy greens, and how leafy vegetables provide fiber, helps maintain healthy weight, helps control blood glucose, and lowers cholesterol.  Discuss purchasing fresh or frozen vegetable to reduce sodium and not to add grease, fat or sugar. Consume <18oz of red meat per week. Consume lean cuts of meats and very little of meats high in sodium and nitrates such as pork and lunch meats. Discussed portion control for all food groups.       Comment  Patient has lost 2.3 lbs since last 30 day review. He continues to say he is following a heart healthy diet. Will continue to monitor for progress.   Patient has maintained his weight since last 30 day review. He continues to say he is following a heart healthy diet. Will continue to monitor for  progress.       Expected Outcome  Patient will continue to meet his nutrition goals.   Patient will continue to meet his nutrition goals.          Nutrition Goals Discharge (Final Nutrition Goals Re-Evaluation): Nutrition Goals Re-Evaluation - 04/23/18 1458  Goals   Current Weight  207 lb 12.8 oz (94.3 kg)    Nutrition Goal  For heart healthy choices add >50% of whole grains, make half their plate fruits and vegetables. Discuss the difference between starchy vegetables and leafy greens, and how leafy vegetables provide fiber, helps maintain healthy weight, helps control blood glucose, and lowers cholesterol.  Discuss purchasing fresh or frozen vegetable to reduce sodium and not to add grease, fat or sugar. Consume <18oz of red meat per week. Consume lean cuts of meats and very little of meats high in sodium and nitrates such as pork and lunch meats. Discussed portion control for all food groups.     Comment  Patient has maintained his weight since last 30 day review. He continues to say he is following a heart healthy diet. Will continue to monitor for progress.     Expected Outcome  Patient will continue to meet his nutrition goals.        Psychosocial: Target Goals: Acknowledge presence or absence of significant depression and/or stress, maximize coping skills, provide positive support system. Participant is able to verbalize types and ability to use techniques and skills needed for reducing stress and depression.  Initial Review & Psychosocial Screening: Initial Psych Review & Screening - 03/03/18 1445      Initial Review   Current issues with  None Identified      Family Dynamics   Good Support System?  Yes    Comments  Patient initial QOL score was 27.44 and his PHQ-9 score was 7 with no psychosocial issues identified.       Barriers   Psychosocial barriers to participate in program  There are no identifiable barriers or psychosocial needs.      Screening Interventions    Interventions  Encouraged to exercise    Expected Outcomes  Short Term goal: Identification and review with participant of any Quality of Life or Depression concerns found by scoring the questionnaire.;Long Term goal: The participant improves quality of Life and PHQ9 Scores as seen by post scores and/or verbalization of changes       Quality of Life Scores: Quality of Life - 03/03/18 1239      Quality of Life Scores   Health/Function Pre  24.88 %    Socioeconomic Pre  29.58 %    Psych/Spiritual Pre  29.64 %    Family Pre  30 %    GLOBAL Pre  27.44 %      Scores of 19 and below usually indicate a poorer quality of life in these areas.  A difference of  2-3 points is a clinically meaningful difference.  A difference of 2-3 points in the total score of the Quality of Life Index has been associated with significant improvement in overall quality of life, self-image, physical symptoms, and general health in studies assessing change in quality of life.   PHQ-9: Recent Review Flowsheet Data    Depression screen [facility] 2/9 03/03/2018   Decreased Interest 0   Down, Depressed, Hopeless 0   PHQ - 2 Score 0   Altered sleeping 0   Tired, decreased energy 3   Change in appetite 3   Feeling bad or failure about yourself  0   Trouble concentrating 1   Moving slowly or fidgety/restless 0   Suicidal thoughts 0   PHQ-9 Score 7   Difficult doing work/chores Not difficult at all     Interpretation of Total Score  Total Score Depression Severity:  1-4 =  Minimal depression, 5-9 = Mild depression, 10-14 = Moderate depression, 15-19 = Moderately severe depression, 20-27 = Severe depression   Psychosocial Evaluation and Intervention: Psychosocial Evaluation - 03/03/18 1446      Psychosocial Evaluation & Interventions   Interventions  Relaxation education;Stress management education;Encouraged to exercise with the program and follow exercise prescription    Comments  There are no psychosocial issues  identified at orientation.     Expected Outcomes  Patient will have no psychosocial issues identified at discharge.     Continue Psychosocial Services   No Follow up required       Psychosocial Re-Evaluation: Psychosocial Re-Evaluation    [place] Name 04/01/18 1601 04/23/18 1512           Psychosocial Re-Evaluation   Current issues with  None Identified  None Identified      Comments  Patient's initial QOL score as 27.44 and his PHQ-9 score was 7 with no psychosocial issues identified.   Patient's initial QOL score as 27.44 and his PHQ-9 score was 7 with no psychosocial issues identified.       Expected Outcomes  Patient will have no psychosocial issues identified at discharge.   Patient will have no psychosocial issues identified at discharge.       Interventions  Relaxation education;Stress management education;Encouraged to attend Pulmonary Rehabilitation for the exercise  Relaxation education;Stress management education;Encouraged to attend Pulmonary Rehabilitation for the exercise      Continue Psychosocial Services   No Follow up required  No Follow up required         Psychosocial Discharge (Final Psychosocial Re-Evaluation): Psychosocial Re-Evaluation - 04/23/18 1512      Psychosocial Re-Evaluation   Current issues with  None Identified    Comments  Patient's initial QOL score as 27.44 and his PHQ-9 score was 7 with no psychosocial issues identified.     Expected Outcomes  Patient will have no psychosocial issues identified at discharge.     Interventions  Relaxation education;Stress management education;Encouraged to attend Pulmonary Rehabilitation for the exercise    Continue Psychosocial Services   No Follow up required        Education: Education Goals: Education classes will be provided on a weekly basis, covering required topics. Participant will state understanding/return demonstration of topics presented.  Learning Barriers/Preferences: Learning Barriers/Preferences  - 03/03/18 1439      Learning Barriers/Preferences   Learning Barriers  None    Learning Preferences  Skilled Demonstration;Written Material       Education Topics: How Lungs Work and Diseases: - Discuss the anatomy of the lungs and diseases that can affect the lungs, such as COPD.   PULMONARY REHAB OTHER RESPIRATORY from 04/15/2018 in [place]  Date  03/17/18  Educator  D. Coad  Instruction Review Code  2- Demonstrated Understanding      Exercise: -Discuss the importance of exercise, FITT principles of exercise, normal and abnormal responses to exercise, and how to exercise safely.   PULMONARY REHAB OTHER RESPIRATORY from 04/15/2018 in [place]  Date  03/11/18  Educator  [place]  Instruction Review Code  2- Demonstrated Understanding      Environmental Irritants: -Discuss types of environmental irritants and how to limit exposure to environmental irritants.   PULMONARY REHAB OTHER RESPIRATORY from 04/15/2018 in [place]  Date  03/25/18  Educator  [place]  Instruction Review Code  2- Demonstrated Understanding      Meds/Inhalers and oxygen: - Discuss respiratory medications,  definition of an inhaler and oxygen, and the proper way to use an inhaler and oxygen.   PULMONARY REHAB OTHER RESPIRATORY from 04/15/2018 in [place]  Date  04/01/18  Educator  [place]      Energy Saving Techniques: - Discuss methods to conserve energy and decrease shortness of breath when performing activities of daily living.    Bronchial Hygiene / Breathing Techniques: - Discuss breathing mechanics, pursed-lip breathing technique,  proper posture, effective ways to clear airways, and other functional breathing techniques   PULMONARY REHAB OTHER RESPIRATORY from 04/15/2018 in [place]  Date  04/15/18  Educator  DC  Instruction Review Code  2- Demonstrated Understanding      Cleaning  Equipment: - Provides group verbal and written instruction about the health risks of elevated stress, cause of high stress, and healthy ways to reduce stress.   Nutrition I: Fats: - Discuss the types of cholesterol, what cholesterol does to the body, and how cholesterol levels can be controlled.   Nutrition II: Labels: -Discuss the different components of food labels and how to read food labels.   Respiratory Infections: - Discuss the signs and symptoms of respiratory infections, ways to prevent respiratory infections, and the importance of seeking medical treatment when having a respiratory infection.   Stress I: Signs and Symptoms: - Discuss the causes of stress, how stress may lead to anxiety and depression, and ways to limit stress.   Stress II: Relaxation: -Discuss relaxation techniques to limit stress.   Oxygen for Home/Travel: - Discuss how to prepare for travel when on oxygen and proper ways to transport and store oxygen to ensure safety.   Knowledge Questionnaire Score: Knowledge Questionnaire Score - 03/03/18 1439      Knowledge Questionnaire Score   Pre Score  12/18       Core Components/Risk Factors/Patient Goals at Admission: Personal Goals and Risk Factors at Admission - 03/03/18 1443      Core Components/Risk Factors/Patient Goals on Admission    Weight Management  Yes    Admit Weight  210 lb (95.3 kg)    Goal Weight: Short Term  205 lb (93 kg)    Goal Weight: Long Term  200 lb (90.7 kg)    Expected Outcomes  Short Term: Continue to assess and modify interventions until short term weight is achieved;Long Term: Adherence to nutrition and physical activity/exercise program aimed toward attainment of established weight goal    Improve shortness of breath with ADL's  Yes    Intervention  Provide education, individualized exercise plan and daily activity instruction to help decrease symptoms of SOB with activities of daily living.    Expected Outcomes  Short  Term: Improve cardiorespiratory fitness to achieve a reduction of symptoms when performing ADLs;Long Term: Be able to perform more ADLs without symptoms or delay the onset of symptoms    Diabetes  Yes    Intervention  Provide education about signs/symptoms and action to take for hypo/hyperglycemia.;Provide education about proper nutrition, including hydration, and aerobic/resistive exercise prescription along with prescribed medications to achieve blood glucose in normal ranges: Fasting glucose 65-99 mg/dL    Expected Outcomes  Long Term: Attainment of HbA1C < 7%.    Personal Goal Other  Yes    Personal Goal  Improve DM control; breathe better; increase strength.     Intervention  Patient will attend PR 2 days/week and supplement with exercise 3 days/week.     Expected Outcomes  Patient will meet  his personal goals.        Core Components/Risk Factors/Patient Goals Review:  Goals and Risk Factor Review    Row Name 04/01/18 1557 04/23/18 1459           Core Components/Risk Factors/Patient Goals Review   Personal Goals Review  Weight Management/Obesity;Improve shortness of breath with ADL's;Increase knowledge of respiratory medications and ability to use respiratory devices properly.;Diabetes Breathe better; Do ADL's without SOB; get stronger; lose 10 lbs.   Weight Management/Obesity;Improve shortness of breath with ADL's;Increase knowledge of respiratory medications and ability to use respiratory devices properly.;Diabetes Breathe better; do ADL's without SOB; get stronger; lsoe 10 lbs.       Review  Patient has completed 8 sessions losing 2.3 lbs since his initial visit. Patient says he can not see any improvement since he started the program. He is being evaluated by a cardiologist and says a stress test has been planned. He is not sure when. He thinks maybe something going on with his heart could be contributing to his SOB. He has no recent Hgb A1C labs showing. He does not take his glucose  often, so he does not always report a reading. Will continue to monitor for progress.   Patient has completed 12 sessions maintaining his weight since last 30 day review. Patient continues to say he does not see any improvement in his breathing or strength contrary to him telling our EP he is getting stronger. He feels like there is more going on than his ILD. He had a heart catheterization 04/20/18 with preliminary results looking like no blockages greater than 40% with finial results with treatment pending. Will continue to monitor for progress.         Expected Outcomes  Patient will continue to attend sessions and complete the program meeting his personal goals.   Patient will continue to attend sessions and complete the program meeting his personal goals.          Core Components/Risk Factors/Patient Goals at Discharge (Final Review):  Goals and Risk Factor Review - 04/23/18 1459      Core Components/Risk Factors/Patient Goals Review   Personal Goals Review  Weight Management/Obesity;Improve shortness of breath with ADL's;Increase knowledge of respiratory medications and ability to use respiratory devices properly.;Diabetes Breathe better; do ADL's without SOB; get stronger; lsoe 10 lbs.     Review  Patient has completed 12 sessions maintaining his weight since last 30 day review. Patient continues to say he does not see any improvement in his breathing or strength contrary to him telling our EP he is getting stronger. He feels like there is more going on than his ILD. He had a heart catheterization 04/20/18 with preliminary results looking like no blockages greater than 40% with finial results with treatment pending. Will continue to monitor for progress.       Expected Outcomes  Patient will continue to attend sessions and complete the program meeting his personal goals.        ITP Comments: ITP Comments    Row Name 03/10/18 1457 03/11/18 1616         ITP Comments  Patient new to the  program completing 2 sessions. Will continue to monitor for progress.   Patient attend the Family Matters class with hosptial chaplian to discuss how this event has impacted their life.          Comments: ITP 30 REVIEW Pt is making expected progress toward pulmonary rehab goals after completing 12 sessions. Recommend continued  exercise, life style modification, education, and utilization of breathing techniques to increase stamina and strength and decrease shortness of breath with exertion.

## 2018-04-26 ENCOUNTER — Telehealth: Payer: Self-pay

## 2018-04-26 NOTE — Telephone Encounter (Signed)
-----   Message from Marshell Garfinkel, MD sent at 04/21/2018  6:23 AM EDT ----- Hi Manish,  Thanks for the update. It is good to rule out cardiac causes of dyspnea.  I will follow up in clinic and work on managing his ILD.  Zandrea Kenealy- Can you make sure he has follow up.  Praveen  ----- Message ----- From: Nigel Mormon, MD Sent: 04/20/2018   5:21 PM To: Marshell Garfinkel, MD  Hi Praveen,  I did left and right heart cath with coronary angiogram on him today. He has only mild coronary artery disease. Filling pressures also look normal, albeit at rest. I doubt his level of shortness of breath could be related to this. What are your thoughts?  Manish

## 2018-04-26 NOTE — Telephone Encounter (Signed)
Pt has been scheduled for ROV with Dr. Vaughan Browner on 04/27/18 at 9:30. Nothing further is needed.

## 2018-04-27 ENCOUNTER — Encounter: Payer: Self-pay | Admitting: Pulmonary Disease

## 2018-04-27 ENCOUNTER — Encounter (HOSPITAL_COMMUNITY): Payer: Medicare Other

## 2018-04-27 ENCOUNTER — Ambulatory Visit (INDEPENDENT_AMBULATORY_CARE_PROVIDER_SITE_OTHER): Payer: Medicare Other | Admitting: Pulmonary Disease

## 2018-04-27 ENCOUNTER — Telehealth: Payer: Self-pay | Admitting: Pulmonary Disease

## 2018-04-27 VITALS — BP 122/64 | HR 90 | Ht 71.5 in | Wt 207.4 lb

## 2018-04-27 DIAGNOSIS — G4733 Obstructive sleep apnea (adult) (pediatric): Secondary | ICD-10-CM | POA: Diagnosis not present

## 2018-04-27 DIAGNOSIS — I2510 Atherosclerotic heart disease of native coronary artery without angina pectoris: Secondary | ICD-10-CM

## 2018-04-27 DIAGNOSIS — J849 Interstitial pulmonary disease, unspecified: Secondary | ICD-10-CM | POA: Diagnosis not present
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
## 2018-04-27 NOTE — Patient Instructions (Addendum)
We will schedule you for a repeat high-resolution CT and spirometry, diffusion capacity Follow-up in 2 to 3 weeks for review of results We will also schedule you for repeat BiPAP titration, oxygen titration for sleep apnea We will get records of your sleep study from your pulmonary doctor.  We will check oxygen levels before discharge today

## 2018-04-27 NOTE — Telephone Encounter (Addendum)
Called Dr. Arcola Jansky office and requested PNA vaccine dates. Was advised that nurse would call back.   Also called [facility] pulmonology and requested that sleep study be faxed to our office.  Will await records.

## 2018-04-27 NOTE — Telephone Encounter (Signed)
Sleep study has been received and placed in Dr. Princella Ion for review.

## 2018-04-29 ENCOUNTER — Encounter (HOSPITAL_COMMUNITY): Payer: Medicare Other

## 2018-04-30 ENCOUNTER — Telehealth: Payer: Self-pay

## 2018-04-30 DIAGNOSIS — R0602 Shortness of breath: Secondary | ICD-10-CM

## 2018-04-30 DIAGNOSIS — I2510 Atherosclerotic heart disease of native coronary artery without angina pectoris: Secondary | ICD-10-CM | POA: Diagnosis not present

## 2018-04-30 DIAGNOSIS — R0609 Other forms of dyspnea: Secondary | ICD-10-CM | POA: Diagnosis not present

## 2018-04-30 DIAGNOSIS — I10 Essential (primary) hypertension: Secondary | ICD-10-CM | POA: Diagnosis not present

## 2018-04-30 DIAGNOSIS — E119 Type 2 diabetes mellitus without complications: Secondary | ICD-10-CM | POA: Diagnosis not present

## 2018-04-30 NOTE — Telephone Encounter (Signed)
-----   Message from Marshell Garfinkel, MD sent at 04/30/2018  4:37 PM EDT ----- Yes. If he is ok with it. I will order and get it done  [facility]- Please order a cardiopulmonary exercise test  ----- Message ----- From: Nigel Mormon, MD Sent: 04/30/2018   2:13 PM To: Marshell Garfinkel, MD  I just saw him for follow up today. I have talked to him about possibly doing a cardiopulmonary exercise stress test. Is it something you could arrange? I am not sure of the logistics to arrange it. If not, I will be happy to ask around.  Thanks MJP   ----- Message ----- From: Marshell Garfinkel, MD Sent: 04/21/2018   6:23 AM To: Manish Esther Hardy, MD, #  Hi Manish,  Thanks for the update. It is good to rule out cardiac causes of dyspnea.  I will follow up in clinic and work on managing his ILD.  Jerre Diguglielmo- Can you make sure he has follow up.  Praveen  ----- Message ----- From: Nigel Mormon, MD Sent: 04/20/2018   5:21 PM To: Marshell Garfinkel, MD  Hi Praveen,  I did left and right heart cath with coronary angiogram on him today. He has only mild coronary artery disease. Filling pressures also look normal, albeit at rest. I doubt his level of shortness of breath could be related to this. What are your thoughts?  Manish

## 2018-04-30 NOTE — Telephone Encounter (Signed)
lmtcb x1 for pt. 

## 2018-05-03 NOTE — Telephone Encounter (Signed)
CPST has been ordered.  Pt is aware and voiced his understanding. Nothing further is needed.

## 2018-05-04 ENCOUNTER — Encounter (HOSPITAL_COMMUNITY)
Admission: RE | Admit: 2018-05-04 | Discharge: 2018-05-04 | Disposition: A | Discharge status: Home / Self Care | Payer: Medicare Other | Source: Ambulatory Visit | Attending: Pulmonary Disease | Admitting: Pulmonary Disease

## 2018-05-04 ENCOUNTER — Ambulatory Visit: Payer: Medicare Other | Attending: Pulmonary Disease | Admitting: Pulmonary Disease

## 2018-05-04 ENCOUNTER — Ambulatory Visit (HOSPITAL_COMMUNITY)
Admission: RE | Admit: 2018-05-04 | Discharge: 2018-05-04 | Disposition: A | Discharge status: Home / Self Care | Payer: Medicare Other | Source: Ambulatory Visit | Attending: Pulmonary Disease | Admitting: Pulmonary Disease

## 2018-05-04 DIAGNOSIS — I700 Atherosclerosis of aorta: Secondary | ICD-10-CM | POA: Diagnosis not present

## 2018-05-04 DIAGNOSIS — J479 Bronchiectasis, uncomplicated: Secondary | ICD-10-CM | POA: Diagnosis not present

## 2018-05-04 DIAGNOSIS — I2510 Atherosclerotic heart disease of native coronary artery without angina pectoris: Secondary | ICD-10-CM | POA: Diagnosis not present

## 2018-05-04 DIAGNOSIS — J849 Interstitial pulmonary disease, unspecified: Secondary | ICD-10-CM | POA: Diagnosis not present

## 2018-05-04 DIAGNOSIS — R918 Other nonspecific abnormal finding of lung field: Secondary | ICD-10-CM | POA: Insufficient documentation

## 2018-05-04 DIAGNOSIS — G4733 Obstructive sleep apnea (adult) (pediatric): Secondary | ICD-10-CM | POA: Diagnosis not present

## 2018-05-04 NOTE — Telephone Encounter (Signed)
Called Dr. Arcola Jansky office regarding update on PNA vaccine dates.  Was advised that pt's last PNA vaccine was 11/11/07.  Will route to make Dr. Vaughan Browner aware.

## 2018-05-04 NOTE — Progress Notes (Signed)
Daily Session Note  Patient Details  Name: Manuel Atkins MRN: [id number] Date of Birth: 05/26/1933 Referring Provider:     PULMONARY REHAB OTHER RESP ORIENTATION from 03/03/2018 in [place]  Referring Provider  Dr. Vaughan Browner      Encounter Date: 05/04/2018  Check In: Session Check In - 05/04/18 1330      Check-In   Location  [facility]    Staff Present  Diane Angelina Pih, MS, EP, [facility], Exercise Physiologist;Percy Winterrowd Luther Parody, BS, EP, Exercise Physiologist    Supervising physician immediately available to respond to emergencies  See telemetry face sheet for immediately available MD    Medication changes reported      No    Fall or balance concerns reported     Yes    Comments  Patient has a h/o of falls and reports balance issues.     Warm-up and Cool-down  Performed as group-led Higher education careers adviser Performed  Yes    VAD Patient?  No      Pain Assessment   Currently in Pain?  No/denies    Pain Score  0-No pain    Multiple Pain Sites  No       Capillary Blood Glucose: No results found for this or any previous visit (from the past 24 hour(s)).    Social History   Tobacco Use  Smoking Status Former Smoker  . Years: 20.00  . Types: Pipe, Cigars  . Last attempt to quit: 11/10/1968  . Years since quitting: 49.5  Smokeless Tobacco Never Used    Goals Met:  Independence with exercise equipment Improved SOB with ADL's Using PLB without cueing & demonstrates good technique Exercise tolerated well No report of cardiac concerns or symptoms Strength training completed today  Goals Unmet:  Not Applicable  Comments: Check out 230   Dr. Sinda Du is Medical Director for [facility] Pulmonary Rehab.

## 2018-05-06 ENCOUNTER — Encounter (HOSPITAL_COMMUNITY)
Admission: RE | Admit: 2018-05-06 | Discharge: 2018-05-06 | Disposition: A | Discharge status: Home / Self Care | Payer: Medicare Other | Source: Ambulatory Visit | Attending: Pulmonary Disease | Admitting: Pulmonary Disease

## 2018-05-06 DIAGNOSIS — J849 Interstitial pulmonary disease, unspecified: Secondary | ICD-10-CM

## 2018-05-06 NOTE — Procedures (Signed)
    Patient Name: Manuel Atkins, Manuel Atkins Date: 05/04/2018 Gender: Male D.O.B: 29-Dec-1932 Age (years): 4 Referring Provider: Marshell Garfinkel Height (inches): 71 Interpreting Physician: Chesley Mires MD, ABSM Weight (lbs): 207 RPSGT: Rosebud Poles BMI: 33 MRN: [id number] Neck Size: 17.50  CLINICAL INFORMATION The patient has a history of bronchiectasis, interstitial lung disease, and obstructive sleep apnea. He has been on Bipap therapy. He is referred for a Bipap titration study.  SLEEP STUDY TECHNIQUE As per the AASM Manual for the Scoring of Sleep and Associated Events v2.3 (April 2016) with a hypopnea requiring 4% desaturations.  The channels recorded and monitored were frontal, central and occipital EEG, electrooculogram (EOG), submentalis EMG (chin), nasal and oral airflow, thoracic and abdominal wall motion, anterior tibialis EMG, snore microphone, electrocardiogram, and pulse oximetry. Bilevel positive airway pressure (BPAP) was initiated at the beginning of the study and titrated to treat sleep-disordered breathing.  MEDICATIONS Medications self-administered by patient taken the night of the study : N/A  RESPIRATORY PARAMETERS Optimal IPAP Pressure (cm): 14 AHI at Optimal Pressure (/hr) 11.6 Optimal EPAP Pressure (cm): 10   Overall Minimal O2 (%): 89.0 Minimal O2 at Optimal Pressure (%): 92.0  He had central apneas with oxygen desaturation at lower Bipap settings.  He did not require supplemental oxygen during this study.  SLEEP ARCHITECTURE Start Time: 9:58:24 PM Stop Time: 5:56:58 AM Total Time (min): 478.6 Total Sleep Time (min): 291.8 Sleep Latency (min): 47.8 Sleep Efficiency (%): 61.0% REM Latency (min): 169.5 WASO (min): 139.0 Stage N1 (%): 5.1% Stage N2 (%): 72.8% Stage N3 (%): 9.6% Stage R (%): 12.51 Supine (%): 42.43 Arousal Index (/hr): 22.0   CARDIAC DATA The 2 lead EKG demonstrated sinus rhythm. The mean heart rate was N/A beats per minute. Other EKG  findings include: None.  LEG MOVEMENT DATA The total Periodic Limb Movements of Sleep (PLMS) were 0. The PLMS index was 0.0. A PLMS index of <15 is considered normal in adults.  IMPRESSIONS - Optimal setting was Bipap 14/10 cm H2O.  He had central apneas with lower settings.  He did not require supplemental oxygen during this study.  DIAGNOSIS - Obstructive Sleep Apnea (327.23 [G47.33 ICD-10])  RECOMMENDATIONS - Trial of BiPAP therapy on 14/10 cm H2O with a Medium size Resmed Nasal Mask Airfit N20 mask and heated humidification.  [Electronically signed] 05/06/2018 10:07 AM  Chesley Mires MD, ABSM Diplomate, American Board of Sleep Medicine NPI: [id number]

## 2018-05-06 NOTE — Progress Notes (Signed)
Daily Session Note  Patient Details  Name: Manuel Atkins MRN: [id number] Date of Birth: [birth year] Referring Provider:     PULMONARY REHAB OTHER RESP ORIENTATION from 03/03/2018 in [place]  Referring Provider  Dr. Vaughan Browner      Encounter Date: 05/06/2018  Check In: Session Check In - 05/06/18 1330      Check-In   Location  [facility]    Staff Present  Diane Angelina Pih, MS, EP, [facility], Exercise Physiologist;Berlynn Warsame Luther Parody, BS, EP, Exercise Physiologist    Supervising physician immediately available to respond to emergencies  See telemetry face sheet for immediately available MD    Medication changes reported      No    Fall or balance concerns reported     Yes    Comments  Patient has a h/o of falls and reports balance issues.     Warm-up and Cool-down  Performed as group-led Higher education careers adviser Performed  Yes    VAD Patient?  No    PAD/SET Patient?  No      Pain Assessment   Currently in Pain?  No/denies    Pain Score  0-No pain    Multiple Pain Sites  No       Capillary Blood Glucose: No results found for this or any previous visit (from the past 24 hour(s)).    Social History   Tobacco Use  Smoking Status Former Smoker  . Years: 20.00  . Types: Pipe, Cigars  . Last attempt to quit: 11/10/1968  . Years since quitting: 49.5  Smokeless Tobacco Never Used    Goals Met:  Independence with exercise equipment Improved SOB with ADL's Using PLB without cueing & demonstrates good technique Exercise tolerated well No report of cardiac concerns or symptoms Strength training completed today  Goals Unmet:  Not Applicable  Comments: Check out 230   Dr. Sinda Du is Medical Director for [facility] Pulmonary Rehab.

## 2018-05-11 ENCOUNTER — Encounter (HOSPITAL_COMMUNITY)
Admission: RE | Admit: 2018-05-11 | Discharge: 2018-05-11 | Disposition: A | Discharge status: Home / Self Care | Payer: Medicare Other | Source: Ambulatory Visit | Attending: Pulmonary Disease | Admitting: Pulmonary Disease

## 2018-05-11 DIAGNOSIS — J849 Interstitial pulmonary disease, unspecified: Secondary | ICD-10-CM | POA: Insufficient documentation

## 2018-05-11 NOTE — Progress Notes (Signed)
ATC, NA and no option to leave msg 

## 2018-05-11 NOTE — Progress Notes (Signed)
Daily Session Note  Patient Details  Name: Manuel Atkins MRN: [id number] Date of Birth: Jan 17, 1933 Referring Provider:     PULMONARY REHAB OTHER RESP ORIENTATION from 03/03/2018 in [place]  Referring Provider  Dr. Vaughan Browner      Encounter Date: 05/11/2018  Check In: Session Check In - 05/11/18 1330      Check-In   Location  [facility]    Staff Present  Diane Angelina Pih, MS, EP, [facility], Exercise Physiologist;Moon Budde Luther Parody, BS, EP, Exercise Physiologist    Supervising physician immediately available to respond to emergencies  See telemetry face sheet for immediately available MD    Medication changes reported      No    Fall or balance concerns reported     Yes    Comments  Patient has a h/o of falls and reports balance issues.     Warm-up and Cool-down  Performed as group-led Higher education careers adviser Performed  Yes    VAD Patient?  No    PAD/SET Patient?  No      Pain Assessment   Currently in Pain?  No/denies    Pain Score  0-No pain    Multiple Pain Sites  No       Capillary Blood Glucose: No results found for this or any previous visit (from the past 24 hour(s)).  Exercise Prescription Changes - 05/11/18 0800      Response to Exercise   Blood Pressure (Admit)  128/66    Blood Pressure (Exercise)  146/68    Blood Pressure (Exit)  128/66    Heart Rate (Admit)  102 bpm    Heart Rate (Exercise)  88 bpm    Heart Rate (Exit)  97 bpm    Oxygen Saturation (Admit)  96 %    Oxygen Saturation (Exercise)  90 %    Oxygen Saturation (Exit)  90 %    Rating of Perceived Exertion (Exercise)  12    Perceived Dyspnea (Exercise)  13    Duration  Progress to 30 minutes of  aerobic without signs/symptoms of physical distress    Intensity  THRR New [phone]      Progression   Progression  Continue to progress workloads to maintain intensity without signs/symptoms of physical distress.      Resistance Training   Training Prescription  Yes     Weight  3    Reps  10-15      Treadmill   MPH  1.7    Grade  0    Minutes  15    METs  2.3      NuStep   Level  2    SPM  97    Minutes  20    METs  2      Home Exercise Plan   Plans to continue exercise at  Home (comment)    Frequency  Add 2 additional days to program exercise sessions.    Initial Home Exercises Provided  03/11/18       Social History   Tobacco Use  Smoking Status Former Smoker  . Years: 20.00  . Types: Pipe, Cigars  . Last attempt to quit: 11/10/1968  . Years since quitting: 49.5  Smokeless Tobacco Never Used    Goals Met:  Independence with exercise equipment Improved SOB with ADL's Using PLB without cueing & demonstrates good technique Exercise tolerated well No report of cardiac concerns or symptoms Strength training completed today  Goals  Unmet:  Not Applicable  Comments: Check out 230   Dr. Sinda Du is Medical Director for [facility] Pulmonary Rehab.

## 2018-05-12 ENCOUNTER — Other Ambulatory Visit: Payer: Self-pay | Admitting: *Deleted

## 2018-05-12 ENCOUNTER — Telehealth: Payer: Self-pay | Admitting: Pulmonary Disease

## 2018-05-12 DIAGNOSIS — G4733 Obstructive sleep apnea (adult) (pediatric): Secondary | ICD-10-CM

## 2018-05-12 NOTE — Telephone Encounter (Signed)
Spoke with pt. Dr. Vaughan Browner wanted him to have a 6MW and PFT prior to his OV. Pt's PFT got scheduled before his 6MW test. He was told that a 6MW couldn't be done after at PFT. Advised him that this was correct. His 6MW has been rescheduled to 05/18/18 at 11am. Nothing further was needed.

## 2018-05-13 ENCOUNTER — Encounter (HOSPITAL_COMMUNITY): Payer: Medicare Other

## 2018-05-18 ENCOUNTER — Ambulatory Visit (INDEPENDENT_AMBULATORY_CARE_PROVIDER_SITE_OTHER): Payer: Medicare Other | Admitting: *Deleted

## 2018-05-18 ENCOUNTER — Ambulatory Visit (HOSPITAL_COMMUNITY): Payer: Medicare Other | Attending: Pulmonary Disease

## 2018-05-18 ENCOUNTER — Encounter (HOSPITAL_COMMUNITY): Payer: Medicare Other

## 2018-05-18 DIAGNOSIS — R0689 Other abnormalities of breathing: Secondary | ICD-10-CM

## 2018-05-18 DIAGNOSIS — R0602 Shortness of breath: Secondary | ICD-10-CM | POA: Insufficient documentation

## 2018-05-18 DIAGNOSIS — R0600 Dyspnea, unspecified: Secondary | ICD-10-CM

## 2018-05-18 DIAGNOSIS — J849 Interstitial pulmonary disease, unspecified: Secondary | ICD-10-CM | POA: Diagnosis not present

## 2018-05-18 NOTE — Progress Notes (Signed)
SIX MIN WALK 05/18/2018 04/27/2018 01/15/2018  Medications Norvasc 1.32m tab, Asa 877m Lialda 1.2g, Metformin 50086mProtonix 83m41mll taken approx 8:30 am.  - -  Supplimental Oxygen during Test? (L/min) No No No  Laps 6 - -  Partial Lap (in Meters) 0 - -  Baseline BP (sitting) 124/66 - -  Baseline Heartrate 87 - -  Baseline Dyspnea (Borg Scale) 2 - -  Baseline Fatigue (Borg Scale) 0.5 - -  Baseline SPO2 99 - -  BP (sitting) 132/70 - -  Heartrate 97 - -  Dyspnea (Borg Scale) 3 - -  Fatigue (Borg Scale) 2 - -  SPO2 100 - -  BP (sitting) 126/68 - -  Heartrate 77 - -  SPO2 98 - -  Stopped or Paused before Six Minutes No - -  Interpretation Hip pain - -  Distance Completed 288 - -  Tech Comments: pt performed test with the use of a cane.  tolerated walk well.   pt walked a moderate pace with a cane, tolerated walk well -

## 2018-05-19 ENCOUNTER — Other Ambulatory Visit (HOSPITAL_COMMUNITY): Payer: Self-pay | Admitting: *Deleted

## 2018-05-19 DIAGNOSIS — R0602 Shortness of breath: Secondary | ICD-10-CM

## 2018-05-20 ENCOUNTER — Ambulatory Visit: Payer: Medicare Other

## 2018-05-20 ENCOUNTER — Ambulatory Visit (HOSPITAL_COMMUNITY)
Admission: RE | Admit: 2018-05-20 | Discharge: 2018-05-20 | Disposition: A | Discharge status: Home / Self Care | Payer: Medicare Other | Source: Ambulatory Visit | Attending: Pulmonary Disease | Admitting: Pulmonary Disease

## 2018-05-20 ENCOUNTER — Encounter (HOSPITAL_COMMUNITY): Payer: Medicare Other

## 2018-05-20 ENCOUNTER — Ambulatory Visit (INDEPENDENT_AMBULATORY_CARE_PROVIDER_SITE_OTHER): Payer: Medicare Other | Admitting: Pulmonary Disease

## 2018-05-20 ENCOUNTER — Encounter: Payer: Self-pay | Admitting: Pulmonary Disease

## 2018-05-20 DIAGNOSIS — J849 Interstitial pulmonary disease, unspecified: Secondary | ICD-10-CM

## 2018-05-20 DIAGNOSIS — I2510 Atherosclerotic heart disease of native coronary artery without angina pectoris: Secondary | ICD-10-CM | POA: Diagnosis not present

## 2018-05-20 LAB — PULMONARY FUNCTION TEST
DL/VA % pred: 79 %
DL/VA: 3.72 ml/min/mmHg/L
DLCO UNC: 14.98 ml/min/mmHg
DLCO unc % pred: 43 %
FEF 25-75 Pre: 2.41 L/sec
FEF2575-%PRED-PRE: 122 %
FEV1-%Pred-Pre: 65 %
FEV1-Pre: 1.91 L
FEV1FVC-%PRED-PRE: 116 %
FEV6-%Pred-Pre: 59 %
FEV6-PRE: 2.32 L
FEV6FVC-%Pred-Pre: 107 %
FVC-%PRED-PRE: 55 %
FVC-PRE: 2.32 L
Pre FEV1/FVC ratio: 82 %
Pre FEV6/FVC Ratio: 100 %

## 2018-05-20 NOTE — Patient Instructions (Signed)
I have reviewed your tests that shows stable lung function Exercise test shows that your shortness of breath is likely from a combination of cardiac and pulmonary issues Continue to work on exercise program, weight loss  We will get a repeat high-resolution CT, spirometry, diffusion capacity and a 6-minute walk test in 6 months time. Follow-up in clinic after tests for review.
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
## 2018-05-20 NOTE — Progress Notes (Signed)
Pulmonary Individual Treatment Plan  Patient Details  Name: Manuel Atkins MRN: [id number] Date of Birth: 1933-01-11 Referring Provider:     PULMONARY REHAB OTHER RESP ORIENTATION from 03/03/2018 in [place] [facility]  Referring Provider  Dr. Vaughan Browner      Initial Encounter Date:    [place] from 03/03/2018 in [place]  Date  03/03/18      Visit Diagnosis: ILD (interstitial lung disease) ([place])  Patient's Home Medications on Admission:   Current Outpatient Medications:  .  acetaminophen (TYLENOL) 500 MG tablet, Take 1,000 mg by mouth every 6 (six) hours as needed for moderate pain or headache., Disp: , Rfl:  .  ALPRAZolam (XANAX) 0.25 MG tablet, Take 1 tablet (0.25 mg total) by mouth 3 (three) times daily as needed. for anxiety (Patient taking differently: Take 0.125 mg by mouth 3 (three) times daily as needed for anxiety. ), Disp: 90 tablet, Rfl: 2 .  amLODipine (NORVASC) 2.5 MG tablet, Take 2.5 mg by mouth daily., Disp: , Rfl:  .  aspirin EC 81 MG tablet, Take 81 mg by mouth daily., Disp: , Rfl:  .  imipramine (TOFRANIL) 25 MG tablet, Take 75 mg by mouth at bedtime, Disp: , Rfl: 0 .  latanoprost (XALATAN) 0.005 % ophthalmic solution, Place 1 drop into both eyes at bedtime., Disp: , Rfl: 6 .  LIALDA 1.2 g EC tablet, TAKE TWO (2) TABLETS BY MOUTH TWICE DAILY, Disp: 360 tablet, Rfl: 4 .  metFORMIN (GLUCOPHAGE-XR) 500 MG 24 hr tablet, Take 1,000 mg by mouth at bedtime., Disp: , Rfl: 1 .  nitroGLYCERIN (NITROSTAT) 0.4 MG SL tablet, Place 0.4 mg under the tongue every 5 (five) minutes as needed for chest pain. , Disp: , Rfl: 1 .  pantoprazole (PROTONIX) 40 MG tablet, TAKE ONE TABLET BY MOUTH EVERY *OTHER* DAY, Disp: 30 tablet, Rfl: 5 .  Probiotic Product (PROBIOTIC DAILY PO), Take 1 capsule by mouth daily. , Disp: , Rfl:  .  rosuvastatin (CRESTOR) 20 MG tablet, Take 20 mg by mouth at bedtime., Disp: , Rfl:  .  tamsulosin  (FLOMAX) 0.4 MG CAPS capsule, Take 0.4 mg by mouth at bedtime., Disp: , Rfl:   Past Medical History: Past Medical History:  Diagnosis Date  . 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 Dyspnea   . Essential hypertension, benign   . GERD (gastroesophageal reflux disease)   . Headache(784.0)   . History of colon polyps   . History of transfusion of whole blood   . Interstitial lung disease ([place]) 12/01/2017  . Mixed hyperlipidemia   . Obstructive sleep apnea (adult) (pediatric)    uses bipap @ HS  . Other malaise and fatigue   . Thrombocytopenia ([place]) 11/10/2012  . Type II or unspecified type diabetes mellitus without mention of complication, uncontrolled   . 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 Years: 20.00  . Types: Pipe, Cigars  . Last attempt to quit: 11/10/1968  . Years since quitting: 49.5  Smokeless Tobacco Never Used    Labs: Recent Chemical engineer    Labs for ITP Cardiac and Pulmonary Rehab Latest Ref Rng & Units 11/07/2012 04/20/2018 04/20/2018   Hemoglobin A1c <5.7 % 6.2(H) - -   PHART 7.350 - 7.450 - - 7.304(L)   PCO2ART 32.0 - 48.0 mmHg - - 42.2   HCO3 20.0 - 28.0 mmol/L - 22.3  21.0   TCO2 22 - 32 mmol/L - 24 22   ACIDBASEDEF 0.0 - 2.0 mmol/L - 4.0(H) 5.0(H)   O2SAT % - 63.0 93.0      Capillary Blood Glucose: Lab Results  Component Value Date   GLUCAP 140 (H) 04/20/2018   GLUCAP 168 (H) 04/20/2018   GLUCAP 206 (H) 04/20/2018   GLUCAP 139 (H) 12/15/2017   GLUCAP 133 (H) 06/16/2013     Pulmonary Assessment Scores: Pulmonary Assessment Scores    Row Name 03/03/18 1440         ADL UCSD   ADL Phase  Entry     SOB Score total  69     Rest  0     Walk  6     Stairs  4     Bath  4     Dress  4     Shop  3       CAT Score   CAT Score  18       mMRC Score   mMRC Score  3        Pulmonary Function Assessment: Pulmonary Function Assessment - 03/03/18 1452       Pulmonary Function Tests   FVC%  58 %    FEV1%  67 %    FEV1/FVC Ratio  82    DLCO%  14.81 %      Post Bronchodilator Spirometry Results   FVC%  54 %    FEV1%  66 %    FEV1/FVC Ratio  86       Exercise Target Goals:    Exercise Program Goal: Individual exercise prescription set using results from initial 6 min walk test and THRR while considering  patient's activity barriers and safety.   Exercise Prescription Goal: Initial exercise prescription builds to 30-45 minutes a day of aerobic activity, 2-3 days per week.  Home exercise guidelines will be given to patient during program as part of exercise prescription that the participant will acknowledge.  Activity Barriers & Risk Stratification: Activity Barriers & Cardiac Risk Stratification - 03/03/18 1405      Activity Barriers & Cardiac Risk Stratification   Activity Barriers  Shortness of Breath    Cardiac Risk Stratification  High       6 Minute Walk: 6 Minute Walk    Row Name 03/03/18 1404         6 Minute Walk   Phase  Initial     Distance  1100 feet     Distance % Change  0 %     Distance Feet Change  0 ft     Walk Time  6 minutes     # of Rest Breaks  0     MPH  2.08     METS  2.59     RPE  12     Perceived Dyspnea   14     VO2 Peak  7.16     Symptoms  No     Resting HR  77 bpm     Resting BP  154/74     Resting Oxygen Saturation   98 %     Exercise Oxygen Saturation  during 6 min walk  90 %     Max Ex. HR  79 bpm     Max Ex. BP  160/74     2 Minute Post BP  144/74        Oxygen Initial Assessment: Oxygen Initial Assessment - 03/03/18  1402      Home Oxygen   Home Oxygen Device  None    Sleep Oxygen Prescription  BiPAP    Home Exercise Oxygen Prescription  None    Home at Rest Exercise Oxygen Prescription  None      Initial 6 min Walk   Oxygen Used  None      Program Oxygen Prescription   Program Oxygen Prescription  None      Intervention   Short Term Goals  To learn and understand  importance of monitoring SPO2 with pulse oximeter and demonstrate accurate use of the pulse oximeter.;To learn and understand importance of maintaining oxygen saturations>88%;To learn and demonstrate proper pursed lip breathing techniques or other breathing techniques.    Long  Term Goals  Verbalizes importance of monitoring SPO2 with pulse oximeter and return demonstration;Maintenance of O2 saturations>88%;Exhibits proper breathing techniques, such as pursed lip breathing or other method taught during program session       Oxygen Re-Evaluation: Oxygen Re-Evaluation    Row Name 04/01/18 1553 04/23/18 1457 05/20/18 1453         Program Oxygen Prescription   Program Oxygen Prescription  None  None  None       Home Oxygen   Home Oxygen Device  None  None  None     Sleep Oxygen Prescription  BiPAP  BiPAP  BiPAP     Liters per minute  0  0  0     Home Exercise Oxygen Prescription  None  None  None     Home at Rest Exercise Oxygen Prescription  None  None  None       Goals/Expected Outcomes   Short Term Goals  To learn and understand importance of monitoring SPO2 with pulse oximeter and demonstrate accurate use of the pulse oximeter.;To learn and understand importance of maintaining oxygen saturations>88%;To learn and demonstrate proper pursed lip breathing techniques or other breathing techniques.  To learn and understand importance of monitoring SPO2 with pulse oximeter and demonstrate accurate use of the pulse oximeter.;To learn and understand importance of maintaining oxygen saturations>88%;To learn and demonstrate proper pursed lip breathing techniques or other breathing techniques.  To learn and understand importance of monitoring SPO2 with pulse oximeter and demonstrate accurate use of the pulse oximeter.;To learn and understand importance of maintaining oxygen saturations>88%;To learn and demonstrate proper pursed lip breathing techniques or other breathing techniques.     Long  Term  Goals  Verbalizes importance of monitoring SPO2 with pulse oximeter and return demonstration;Maintenance of O2 saturations>88%;Exhibits proper breathing techniques, such as pursed lip breathing or other method taught during program session  Verbalizes importance of monitoring SPO2 with pulse oximeter and return demonstration;Maintenance of O2 saturations>88%;Exhibits proper breathing techniques, such as pursed lip breathing or other method taught during program session  Verbalizes importance of monitoring SPO2 with pulse oximeter and return demonstration;Maintenance of O2 saturations>88%;Exhibits proper breathing techniques, such as pursed lip breathing or other method taught during program session     Comments  Patient demonstrates proper pursed lip breathing techniques during exercise and also demonstrates proper usage of pulse oximeter. He is able to verbalize the importance of maintaining his O2 saturations >88%. Will continue to monitor.   Patient demonstrates proper pursed lip breathing techniques during exercise and also demonstrates proper usage of pulse oximeter. He is able to verbalize the importance of maintaining his O2 saturations >88%. Will continue to monitor.   Patient demonstrates proper pursed lip breathing techniques during exercise and  also demonstrates proper usage of pulse oximeter. He is able to verbalize the importance of maintaining his O2 saturations >88%. Will continue to monitor.      Goals/Expected Outcomes  Patient will continue to meet his short and long term goals.   Patient will continue to meet his short and long term goals.   Patient will continue to meet his short and long term goals.         Oxygen Discharge (Final Oxygen Re-Evaluation): Oxygen Re-Evaluation - 05/20/18 1453      Program Oxygen Prescription   Program Oxygen Prescription  None      Home Oxygen   Home Oxygen Device  None    Sleep Oxygen Prescription  BiPAP    Liters per minute  0    Home Exercise  Oxygen Prescription  None    Home at Rest Exercise Oxygen Prescription  None      Goals/Expected Outcomes   Short Term Goals  To learn and understand importance of monitoring SPO2 with pulse oximeter and demonstrate accurate use of the pulse oximeter.;To learn and understand importance of maintaining oxygen saturations>88%;To learn and demonstrate proper pursed lip breathing techniques or other breathing techniques.    Long  Term Goals  Verbalizes importance of monitoring SPO2 with pulse oximeter and return demonstration;Maintenance of O2 saturations>88%;Exhibits proper breathing techniques, such as pursed lip breathing or other method taught during program session    Comments  Patient demonstrates proper pursed lip breathing techniques during exercise and also demonstrates proper usage of pulse oximeter. He is able to verbalize the importance of maintaining his O2 saturations >88%. Will continue to monitor.     Goals/Expected Outcomes  Patient will continue to meet his short and long term goals.        Initial Exercise Prescription: Initial Exercise Prescription - 03/03/18 1400      Date of Initial Exercise RX and Referring Provider   Date  03/03/18    Referring Provider  Dr. Vaughan Browner      Treadmill   MPH  1.3    Grade  0    Minutes  15    METs  1.9      NuStep   Level  1    SPM  74    Minutes  20    METs  1.9      Prescription Details   Frequency (times per week)  3    Duration  Progress to 30 minutes of continuous aerobic without signs/symptoms of physical distress      Intensity   THRR 40-80% of Max Heartrate  101-113-126    Ratings of Perceived Exertion  11-13    Perceived Dyspnea  0-4      Progression   Progression  Continue progressive overload as per policy without signs/symptoms or physical distress.      Resistance Training   Training Prescription  Yes    Weight  1    Reps  10-15       Perform Capillary Blood Glucose checks as needed.  Exercise Prescription  Changes:  Exercise Prescription Changes    Row Name 03/11/18 1400 03/16/18 1000 03/30/18 0700 04/12/18 1400 04/28/18 1400     Response to Exercise   Blood Pressure (Admit)  -  138/68  130/70  130/60  120/68   Blood Pressure (Exercise)  -  160/70  150/74  150/68  150/62   Blood Pressure (Exit)  -  144/70  134/68  128/66  110/64   Heart  Rate (Admit)  -  91 bpm  84 bpm  91 bpm  87 bpm   Heart Rate (Exercise)  -  73 bpm  99 bpm  91 bpm  80 bpm   Heart Rate (Exit)  -  82 bpm  89 bpm  91 bpm  95 bpm   Oxygen Saturation (Admit)  -  97 %  93 %  91 %  96 %   Oxygen Saturation (Exercise)  -  98 %  94 %  90 %  92 %   Oxygen Saturation (Exit)  -  91 %  90 %  97 %  93 %   Rating of Perceived Exertion (Exercise)  -  12  12  11  12    Perceived Dyspnea (Exercise)  -  12  12  11  13    Duration  -  Progress to 30 minutes of  aerobic without signs/symptoms of physical distress  Progress to 30 minutes of  aerobic without signs/symptoms of physical distress  Progress to 30 minutes of  aerobic without signs/symptoms of physical distress  Progress to 30 minutes of  aerobic without signs/symptoms of physical distress   Intensity  -  THRR New 110-119-129  THRR New 106-116-127  THRR New 110-119-129  THRR New 107-118-128     Progression   Progression  -  Continue to progress workloads to maintain intensity without signs/symptoms of physical distress.  Continue to progress workloads to maintain intensity without signs/symptoms of physical distress.  Continue to progress workloads to maintain intensity without signs/symptoms of physical distress.  Continue to progress workloads to maintain intensity without signs/symptoms of physical distress.     Resistance Training   Training Prescription  Yes  Yes  Yes  Yes  Yes   Weight  1  1  2  2  3    Reps  10-15  10-15  10-15  10-15  10-15     Treadmill   MPH  1.3  1.3  1.5  1.7  1.7   Grade  0  0  0  0  0   Minutes  15  15  15  15  15    METs  1.9  1.9  2.15  2.3  2.3      NuStep   Level  1  1  1  2  2    SPM  74  95  92  125  115   Minutes  20  20  20  20  20    METs  1.9  1.9  1.8  1.8  1.9     Home Exercise Plan   Plans to continue exercise at  Home (comment)  Home (comment)  Home (comment)  Home (comment)  Home (comment)   Frequency  Add 2 additional days to program exercise sessions.  Add 2 additional days to program exercise sessions.  Add 2 additional days to program exercise sessions.  Add 2 additional days to program exercise sessions.  Add 2 additional days to program exercise sessions.   Initial Home Exercises Provided  03/11/18  03/11/18  03/11/18  03/11/18  03/11/18   Row Name 05/11/18 0800             Response to Exercise   Blood Pressure (Admit)  128/66       Blood Pressure (Exercise)  146/68       Blood Pressure (Exit)  128/66       Heart Rate (Admit)  102 bpm  Heart Rate (Exercise)  88 bpm       Heart Rate (Exit)  97 bpm       Oxygen Saturation (Admit)  96 %       Oxygen Saturation (Exercise)  90 %       Oxygen Saturation (Exit)  90 %       Rating of Perceived Exertion (Exercise)  12       Perceived Dyspnea (Exercise)  13       Duration  Progress to 30 minutes of  aerobic without signs/symptoms of physical distress       Intensity  THRR New [phone]         Progression   Progression  Continue to progress workloads to maintain intensity without signs/symptoms of physical distress.         Resistance Training   Training Prescription  Yes       Weight  3       Reps  10-15         Treadmill   MPH  1.7       Grade  0       Minutes  15       METs  2.3         NuStep   Level  2       SPM  97       Minutes  20       METs  2         Home Exercise Plan   Plans to continue exercise at  Home (comment)       Frequency  Add 2 additional days to program exercise sessions.       Initial Home Exercises Provided  03/11/18          Exercise Comments:  Exercise Comments    Row Name 03/11/18 1448 03/16/18 1027 03/30/18  0748 04/12/18 1449 04/28/18 1416   Exercise Comments  Patient recieved the take home exercise plan today 03/11/2018. THR was addressed as were safety guidelines for being active when not here in PR. Patient demonstrated an understanding and was encouraged to ask any future questions as they arise.   Patient has been doing well in PR. Patient has just started the program and is handling the initial exercise progression well. Patient will be progressed in time.   Patient is doing well in PR. Patient has increased his speed on the treadmill and his weights for the warm up portion of the session have increased to 2lbs. Patient has stated to me that he feels some more strength. Patient has still only been for PR for 6 sessions. Patient will be progressed more over time to meet his goals.    Patient is doing well in PR. Patient has increased his level on the nustep machine and increased his speed on the treadmill to 1.7. Patient states that he feels more energy to do tasks around the house.   Patient has been doing well in PR, increasing his weight on the warm up portion of the sessions to 3lbs and maintaining his levels on the machines. Patient has not attended class since 04/15/2018 so no more progression has yet been made.    [place] Name 05/11/18 0818           Exercise Comments  Patient is doing well in PR. 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 Patient will be progressed in time.  Exercise Goals and Review:  Exercise Goals    Row Name 03/03/18 1406             Exercise Goals   Increase Physical Activity  Yes       Intervention  Provide advice, education, support and counseling about physical activity/exercise needs.;Develop an individualized exercise prescription for aerobic and resistive training based on initial evaluation findings, risk stratification, comorbidities and participant's personal goals.       Expected Outcomes  Short Term:  Attend rehab on a regular basis to increase amount of physical activity.       Increase Strength and Stamina  Yes       Intervention  Provide advice, education, support and counseling about physical activity/exercise needs.;Develop an individualized exercise prescription for aerobic and resistive training based on initial evaluation findings, risk stratification, comorbidities and participant's personal goals.       Expected Outcomes  Short Term: Increase workloads from initial exercise prescription for resistance, speed, and METs.       Able to understand and use rate of perceived exertion (RPE) scale  Yes       Intervention  Provide education and explanation on how to use RPE scale       Expected Outcomes  Short Term: Able to use RPE daily in rehab to express subjective intensity level;Long Term:  Able to use RPE to guide intensity level when exercising independently       Able to understand and use Dyspnea scale  Yes       Intervention  Provide education and explanation on how to use Dyspnea scale       Expected Outcomes  Short Term: Able to use Dyspnea scale daily in rehab to express subjective sense of shortness of breath during exertion;Long Term: Able to use Dyspnea scale to guide intensity level when exercising independently       Knowledge and understanding of Target Heart Rate Range (THRR)  Yes       Intervention  Provide education and explanation of THRR including how the numbers were predicted and where they are located for reference       Expected Outcomes  Short Term: Able to state/look up THRR;Long Term: Able to use THRR to govern intensity when exercising independently;Short Term: Able to use daily as guideline for intensity in rehab       Able to check pulse independently  Yes       Intervention  Provide education and demonstration on how to check pulse in carotid and radial arteries.;Review the importance of being able to check your own pulse for safety during independent exercise        Expected Outcomes  Short Term: Able to explain why pulse checking is important during independent exercise;Long Term: Able to check pulse independently and accurately       Understanding of Exercise Prescription  Yes       Intervention  Provide education, explanation, and written materials on patient's individual exercise prescription       Expected Outcomes  Short Term: Able to explain program exercise prescription;Long Term: Able to explain home exercise prescription to exercise independently          Exercise Goals Re-Evaluation : Exercise Goals Re-Evaluation    Row Name 03/30/18 0746 04/20/18 0831 05/17/18 1511         Exercise Goal Re-Evaluation   Exercise Goals Review  Increase Physical Activity;Increase Strength and Stamina;Able to check pulse independently;Able to understand and use rate of  perceived exertion (RPE) scale;Knowledge and understanding of Target Heart Rate Range (THRR);Able to understand and use Dyspnea scale;Understanding of Exercise Prescription  Increase Physical Activity;Increase Strength and Stamina;Able to check pulse independently;Able to understand and use rate of perceived exertion (RPE) scale;Knowledge and understanding of Target Heart Rate Range (THRR);Able to understand and use Dyspnea scale;Understanding of Exercise Prescription  Increase Physical Activity;Increase Strength and Stamina;Able to check pulse independently;Able to understand and use rate of perceived exertion (RPE) scale;Knowledge and understanding of Target Heart Rate Range (THRR);Able to understand and use Dyspnea scale;Understanding of Exercise Prescription     Comments  Patient is doing well in PR. Patient has increased his speed on the treadmill and his weights for the warm up portion of the session have increased to 2lbs. Patient has stated to me that he feels some more strength. Patient has still only been for PR for 6 sessions. Patient will be progressed more over time to meet his goals.     Patient has been doing very well in PR. Patient has stated to me that he feels much more energy and strength from the program. Patient has become less SOB he states. Patient has been doing well on his nustep and has been progressed to level 2 on the machine.   Patient has been doing very well in CR. Patient has just recently returned from getting several tests done that have hindered his attendance. Patient has stated to me that he has much better strength since beginning the program however he is still working on being able to breathe better and working on breathing techniques.      Expected Outcomes  Patient wishes to lose weight, breathe better, and to gain strength.   Patient wishes to lose weight, breathe better, and to gain strength.   Patient wishes to lose weight, breathe better, and to gain strength.         Discharge Exercise Prescription (Final Exercise Prescription Changes): Exercise Prescription Changes - 05/11/18 0800      Response to Exercise   Blood Pressure (Admit)  128/66    Blood Pressure (Exercise)  146/68    Blood Pressure (Exit)  128/66    Heart Rate (Admit)  102 bpm    Heart Rate (Exercise)  88 bpm    Heart Rate (Exit)  97 bpm    Oxygen Saturation (Admit)  96 %    Oxygen Saturation (Exercise)  90 %    Oxygen Saturation (Exit)  90 %    Rating of Perceived Exertion (Exercise)  12    Perceived Dyspnea (Exercise)  13    Duration  Progress to 30 minutes of  aerobic without signs/symptoms of physical distress    Intensity  THRR New [phone]      Progression   Progression  Continue to progress workloads to maintain intensity without signs/symptoms of physical distress.      Resistance Training   Training Prescription  Yes    Weight  3    Reps  10-15      Treadmill   MPH  1.7    Grade  0    Minutes  15    METs  2.3      NuStep   Level  2    SPM  97    Minutes  20    METs  2      Home Exercise Plan   Plans to continue exercise at  Home (comment)     Frequency  Add 2 additional days  to program exercise sessions.    Initial Home Exercises Provided  03/11/18       Nutrition:  Target Goals: Understanding of nutrition guidelines, daily intake of sodium <1537m, cholesterol <2032m calories 30% from fat and 7% or less from saturated fats, daily to have 5 or more servings of fruits and vegetables.  Biometrics: Pre Biometrics - 03/03/18 1406      Pre Biometrics   Height  5' 11"  (1.803 m)    Weight  210 lb (95.3 kg)    Waist Circumference  42 inches    Hip Circumference  43 inches    Waist to Hip Ratio  0.98 %    BMI (Calculated)  29.3    Triceps Skinfold  9 mm    % Body Fat  27.2 %    Grip Strength  62.29 kg    Flexibility  0 in    Single Leg Stand  2 seconds        Nutrition Therapy Plan and Nutrition Goals: Nutrition Therapy & Goals - 03/11/18 1616      Personal Nutrition Goals   Nutrition Goal  For heart healthy choices add >50% of whole grains, make half their plate fruits and vegetables. Discuss the difference between starchy vegetables and leafy greens, and how leafy vegetables provide fiber, helps maintain healthy weight, helps control blood glucose, and lowers cholesterol.  Discuss purchasing fresh or frozen vegetable to reduce sodium and not to add grease, fat or sugar. Consume <18oz of red meat per week. Consume lean cuts of meats and very little of meats high in sodium and nitrates such as pork and lunch meats. Discussed portion control for all food groups.     Additional Goals?  No      Intervention Plan   Intervention  Nutrition handout(s) given to patient.    Expected Outcomes  Short Term Goal: Understand basic principles of dietary content, such as calories, fat, sodium, cholesterol and nutrients.       Nutrition Assessments: Nutrition Assessments - 03/03/18 1443      MEDFICTS Scores   Pre Score  36       Nutrition Goals Re-Evaluation: Nutrition Goals Re-Evaluation    [place] 04/01/18 1555 04/23/18 1458            Goals   Current Weight  207 lb 11.2 oz (94.2 kg)  207 lb 12.8 oz (94.3 kg)      Nutrition Goal  For heart healthy choices add >50% of whole grains, make half their plate fruits and vegetables. Discuss the difference between starchy vegetables and leafy greens, and how leafy vegetables provide fiber, helps maintain healthy weight, helps control blood glucose, and lowers cholesterol.  Discuss purchasing fresh or frozen vegetable to reduce sodium and not to add grease, fat or sugar. Consume <18oz of red meat per week. Consume lean cuts of meats and very little of meats high in sodium and nitrates such as pork and lunch meats. Discussed portion control for all food groups.   For heart healthy choices add >50% of whole grains, make half their plate fruits and vegetables. Discuss the difference between starchy vegetables and leafy greens, and how leafy vegetables provide fiber, helps maintain healthy weight, helps control blood glucose, and lowers cholesterol.  Discuss purchasing fresh or frozen vegetable to reduce sodium and not to add grease, fat or sugar. Consume <18oz of red meat per week. Consume lean cuts of meats and very little of meats high in sodium and nitrates  such as pork and lunch meats. Discussed portion control for all food groups.       Comment  Patient has lost 2.3 lbs since last 30 day review. He continues to say he is following a heart healthy diet. Will continue to monitor for progress.   Patient has maintained his weight since last 30 day review. He continues to say he is following a heart healthy diet. Will continue to monitor for progress.       Expected Outcome  Patient will continue to meet his nutrition goals.   Patient will continue to meet his nutrition goals.          Nutrition Goals Discharge (Final Nutrition Goals Re-Evaluation): Nutrition Goals Re-Evaluation - 04/23/18 1458      Goals   Current Weight  207 lb 12.8 oz (94.3 kg)    Nutrition Goal  For heart  healthy choices add >50% of whole grains, make half their plate fruits and vegetables. Discuss the difference between starchy vegetables and leafy greens, and how leafy vegetables provide fiber, helps maintain healthy weight, helps control blood glucose, and lowers cholesterol.  Discuss purchasing fresh or frozen vegetable to reduce sodium and not to add grease, fat or sugar. Consume <18oz of red meat per week. Consume lean cuts of meats and very little of meats high in sodium and nitrates such as pork and lunch meats. Discussed portion control for all food groups.     Comment  Patient has maintained his weight since last 30 day review. He continues to say he is following a heart healthy diet. Will continue to monitor for progress.     Expected Outcome  Patient will continue to meet his nutrition goals.        Psychosocial: Target Goals: Acknowledge presence or absence of significant depression and/or stress, maximize coping skills, provide positive support system. Participant is able to verbalize types and ability to use techniques and skills needed for reducing stress and depression.  Initial Review & Psychosocial Screening: Initial Psych Review & Screening - 03/03/18 1445      Initial Review   Current issues with  None Identified      Family Dynamics   Good Support System?  Yes    Comments  Patient initial QOL score was 27.44 and his PHQ-9 score was 7 with no psychosocial issues identified.       Barriers   Psychosocial barriers to participate in program  There are no identifiable barriers or psychosocial needs.      Screening Interventions   Interventions  Encouraged to exercise    Expected Outcomes  Short Term goal: Identification and review with participant of any Quality of Life or Depression concerns found by scoring the questionnaire.;Long Term goal: The participant improves quality of Life and PHQ9 Scores as seen by post scores and/or verbalization of changes       Quality of  Life Scores: Quality of Life - 03/03/18 1239      Quality of Life Scores   Health/Function Pre  24.88 %    Socioeconomic Pre  29.58 %    Psych/Spiritual Pre  29.64 %    Family Pre  30 %    GLOBAL Pre  27.44 %      Scores of 19 and below usually indicate a poorer quality of life in these areas.  A difference of  2-3 points is a clinically meaningful difference.  A difference of 2-3 points in the total score of the Quality of Life  Index has been associated with significant improvement in overall quality of life, self-image, physical symptoms, and general health in studies assessing change in quality of life.   PHQ-9: Recent Review Flowsheet Data    Depression screen [facility] 2/9 03/03/2018   Decreased Interest 0   Down, Depressed, Hopeless 0   PHQ - 2 Score 0   Altered sleeping 0   Tired, decreased energy 3   Change in appetite 3   Feeling bad or failure about yourself  0   Trouble concentrating 1   Moving slowly or fidgety/restless 0   Suicidal thoughts 0   PHQ-9 Score 7   Difficult doing work/chores Not difficult at all     Interpretation of Total Score  Total Score Depression Severity:  1-4 = Minimal depression, 5-9 = Mild depression, 10-14 = Moderate depression, 15-19 = Moderately severe depression, 20-27 = Severe depression   Psychosocial Evaluation and Intervention: Psychosocial Evaluation - 03/03/18 1446      Psychosocial Evaluation & Interventions   Interventions  Relaxation education;Stress management education;Encouraged to exercise with the program and follow exercise prescription    Comments  There are no psychosocial issues identified at orientation.     Expected Outcomes  Patient will have no psychosocial issues identified at discharge.     Continue Psychosocial Services   No Follow up required       Psychosocial Re-Evaluation: Psychosocial Re-Evaluation    [place] Name 04/01/18 1601 04/23/18 1512 05/20/18 1458         Psychosocial Re-Evaluation   Current issues  with  None Identified  None Identified  None Identified     Comments  Patient's initial QOL score as 27.44 and his PHQ-9 score was 7 with no psychosocial issues identified.   Patient's initial QOL score as 27.44 and his PHQ-9 score was 7 with no psychosocial issues identified.   Patient's initial QOL score as 27.44 and his PHQ-9 score was 7 with no psychosocial issues identified.      Expected Outcomes  Patient will have no psychosocial issues identified at discharge.   Patient will have no psychosocial issues identified at discharge.   Patient will have no psychosocial issues identified at discharge.      Interventions  Relaxation education;Stress management education;Encouraged to attend Pulmonary Rehabilitation for the exercise  Relaxation education;Stress management education;Encouraged to attend Pulmonary Rehabilitation for the exercise  Relaxation education;Stress management education;Encouraged to attend Pulmonary Rehabilitation for the exercise     Continue Psychosocial Services   No Follow up required  No Follow up required  No Follow up required        Psychosocial Discharge (Final Psychosocial Re-Evaluation): Psychosocial Re-Evaluation - 05/20/18 1458      Psychosocial Re-Evaluation   Current issues with  None Identified    Comments  Patient's initial QOL score as 27.44 and his PHQ-9 score was 7 with no psychosocial issues identified.     Expected Outcomes  Patient will have no psychosocial issues identified at discharge.     Interventions  Relaxation education;Stress management education;Encouraged to attend Pulmonary Rehabilitation for the exercise    Continue Psychosocial Services   No Follow up required        Education: Education Goals: Education classes will be provided on a weekly basis, covering required topics. Participant will state understanding/return demonstration of topics presented.  Learning Barriers/Preferences: Learning Barriers/Preferences - 03/03/18 1439       Learning Barriers/Preferences   Learning Barriers  None    Learning Preferences  Skilled  Demonstration;Written Material       Education Topics: How Lungs Work and Diseases: - Discuss the anatomy of the lungs and diseases that can affect the lungs, such as COPD.   PULMONARY REHAB OTHER RESPIRATORY from 05/06/2018 in [place]  Date  03/17/18  Educator  D. Coad  Instruction Review Code  2- Demonstrated Understanding      Exercise: -Discuss the importance of exercise, FITT principles of exercise, normal and abnormal responses to exercise, and how to exercise safely.   PULMONARY REHAB OTHER RESPIRATORY from 05/06/2018 in [place]  Date  03/11/18  Educator  [place]  Instruction Review Code  2- Demonstrated Understanding      Environmental Irritants: -Discuss types of environmental irritants and how to limit exposure to environmental irritants.   PULMONARY REHAB OTHER RESPIRATORY from 05/06/2018 in [place]  Date  03/25/18  Educator  [place]  Instruction Review Code  2- Demonstrated Understanding      Meds/Inhalers and oxygen: - Discuss respiratory medications, definition of an inhaler and oxygen, and the proper way to use an inhaler and oxygen.   PULMONARY REHAB OTHER RESPIRATORY from 05/06/2018 in [place]  Date  04/01/18  Educator  DC      Energy Saving Techniques: - Discuss methods to conserve energy and decrease shortness of breath when performing activities of daily living.    Bronchial Hygiene / Breathing Techniques: - Discuss breathing mechanics, pursed-lip breathing technique,  proper posture, effective ways to clear airways, and other functional breathing techniques   PULMONARY REHAB OTHER RESPIRATORY from 05/06/2018 in [place]  Date  04/15/18  Educator  DC  Instruction Review Code  2- Demonstrated Understanding      Cleaning Equipment: - Provides  group verbal and written instruction about the health risks of elevated stress, cause of high stress, and healthy ways to reduce stress.   Nutrition I: Fats: - Discuss the types of cholesterol, what cholesterol does to the body, and how cholesterol levels can be controlled.   Nutrition II: Labels: -Discuss the different components of food labels and how to read food labels.   PULMONARY REHAB OTHER RESPIRATORY from 05/06/2018 in [place]  Date  05/06/18  Educator  D. Coad  Instruction Review Code  2- Demonstrated Understanding      Respiratory Infections: - Discuss the signs and symptoms of respiratory infections, ways to prevent respiratory infections, and the importance of seeking medical treatment when having a respiratory infection.   Stress I: Signs and Symptoms: - Discuss the causes of stress, how stress may lead to anxiety and depression, and ways to limit stress.   Stress II: Relaxation: -Discuss relaxation techniques to limit stress.   Oxygen for Home/Travel: - Discuss how to prepare for travel when on oxygen and proper ways to transport and store oxygen to ensure safety.   Knowledge Questionnaire Score: Knowledge Questionnaire Score - 03/03/18 1439      Knowledge Questionnaire Score   Pre Score  12/18       Core Components/Risk Factors/Patient Goals at Admission: Personal Goals and Risk Factors at Admission - 03/03/18 1443      Core Components/Risk Factors/Patient Goals on Admission    Weight Management  Yes    Admit Weight  210 lb (95.3 kg)    Goal Weight: Short Term  205 lb (93 kg)    Goal Weight: Long Term  200 lb (90.7 kg)    Expected  Outcomes  Short Term: Continue to assess and modify interventions until short term weight is achieved;Long Term: Adherence to nutrition and physical activity/exercise program aimed toward attainment of established weight goal    Improve shortness of breath with ADL's  Yes    Intervention  Provide  education, individualized exercise plan and daily activity instruction to help decrease symptoms of SOB with activities of daily living.    Expected Outcomes  Short Term: Improve cardiorespiratory fitness to achieve a reduction of symptoms when performing ADLs;Long Term: Be able to perform more ADLs without symptoms or delay the onset of symptoms    Diabetes  Yes    Intervention  Provide education about signs/symptoms and action to take for hypo/hyperglycemia.;Provide education about proper nutrition, including hydration, and aerobic/resistive exercise prescription along with prescribed medications to achieve blood glucose in normal ranges: Fasting glucose 65-99 mg/dL    Expected Outcomes  Long Term: Attainment of HbA1C < 7%.    Personal Goal Other  Yes    Personal Goal  Improve DM control; breathe better; increase strength.     Intervention  Patient will attend PR 2 days/week and supplement with exercise 3 days/week.     Expected Outcomes  Patient will meet his personal goals.        Core Components/Risk Factors/Patient Goals Review:  Goals and Risk Factor Review    Row Name 04/01/18 1557 04/23/18 1459 05/20/18 1453         Core Components/Risk Factors/Patient Goals Review   Personal Goals Review  Weight Management/Obesity;Improve shortness of breath with ADL's;Increase knowledge of respiratory medications and ability to use respiratory devices properly.;Diabetes Breathe better; Do ADL's without SOB; get stronger; lose 10 lbs.   Weight Management/Obesity;Improve shortness of breath with ADL's;Increase knowledge of respiratory medications and ability to use respiratory devices properly.;Diabetes Breathe better; do ADL's without SOB; get stronger; lsoe 10 lbs.   Weight Management/Obesity;Improve shortness of breath with ADL's;Increase knowledge of respiratory medications and ability to use respiratory devices properly.;Diabetes Breathe better; Do ADL's without SOB; Get stronger; Lose 10 lbs.       Review  Patient has completed 8 sessions losing 2.3 lbs since his initial visit. Patient says he can not see any improvement since he started the program. He is being evaluated by a cardiologist and says a stress test has been planned. He is not sure when. He thinks maybe something going on with his heart could be contributing to his SOB. He has no recent Hgb A1C labs showing. He does not take his glucose often, so he does not always report a reading. Will continue to monitor for progress.   Patient has completed 12 sessions maintaining his weight since last 30 day review. Patient continues to say he does not see any improvement in his breathing or strength contrary to him telling our EP he is getting stronger. He feels like there is more going on than his ILD. He had a heart catheterization 04/20/18 with preliminary results looking like no blockages greater than 40% with finial results with treatment pending. Will continue to monitor for progress.     Patient has completed 15 sessions losing 6 lbs since he started the program. He has missed several sessions since last 30 day review due to cardiovascular workup which was negative. He continues to say that the program has not helped him in his breathing or strength. He is pleased with his weight loss. Will continue to monitor for progress.      Expected Outcomes  Patient will continue to attend sessions and complete the program meeting his personal goals.   Patient will continue to attend sessions and complete the program meeting his personal goals.   Patient will continue to attend sessions and complete the program meeting his personal goals.         Core Components/Risk Factors/Patient Goals at Discharge (Final Review):  Goals and Risk Factor Review - 05/20/18 1453      Core Components/Risk Factors/Patient Goals Review   Personal Goals Review  Weight Management/Obesity;Improve shortness of breath with ADL's;Increase knowledge of respiratory medications and  ability to use respiratory devices properly.;Diabetes Breathe better; Do ADL's without SOB; Get stronger; Lose 10 lbs.     Review  Patient has completed 15 sessions losing 6 lbs since he started the program. He has missed several sessions since last 30 day review due to cardiovascular workup which was negative. He continues to say that the program has not helped him in his breathing or strength. He is pleased with his weight loss. Will continue to monitor for progress.     Expected Outcomes  Patient will continue to attend sessions and complete the program meeting his personal goals.        ITP Comments: ITP Comments    Row Name 03/10/18 1457 03/11/18 1616         ITP Comments  Patient new to the program completing 2 sessions. Will continue to monitor for progress.   Patient attend the Family Matters class with hosptial chaplian to discuss how this event has impacted their life.          Comments: ITP 30 Day REVIEW Pt is making expected progress toward pulmonary rehab goals after completing 15 sessions. Recommend continued exercise, life style modification, education, and utilization of breathing techniques to increase stamina and strength and decrease shortness of breath with exertion.

## 2018-05-25 ENCOUNTER — Encounter (HOSPITAL_COMMUNITY)
Admission: RE | Admit: 2018-05-25 | Discharge: 2018-05-25 | Disposition: A | Discharge status: Home / Self Care | Payer: Medicare Other | Source: Ambulatory Visit | Attending: Pulmonary Disease | Admitting: Pulmonary Disease

## 2018-05-25 DIAGNOSIS — J849 Interstitial pulmonary disease, unspecified: Secondary | ICD-10-CM | POA: Diagnosis not present

## 2018-05-25 NOTE — Progress Notes (Signed)
Daily Session Note  Patient Details  Name: Manuel Atkins MRN: [id number] Date of Birth: [birth year] Referring Provider:     PULMONARY REHAB OTHER RESP ORIENTATION from 03/03/2018 in [place]  Referring Provider  Dr. Vaughan Browner      Encounter Date: 05/25/2018  Check In: Session Check In - 05/25/18 1300      Check-In   Location  [facility]    Staff Present  Diane Angelina Pih, MS, EP, [facility], Exercise Physiologist;Yaresly Menzel Wynetta Emery, RN, BSN    Supervising physician immediately available to respond to emergencies  See telemetry face sheet for immediately available MD    Medication changes reported      No    Fall or balance concerns reported     Yes    Comments  Patient has a h/o of falls and reports balance issues.     Warm-up and Cool-down  Performed as group-led Higher education careers adviser Performed  Yes    VAD Patient?  No    PAD/SET Patient?  No      Pain Assessment   Currently in Pain?  No/denies    Pain Score  0-No pain    Multiple Pain Sites  No       Capillary Blood Glucose: No results found for this or any previous visit (from the past 24 hour(s)).    Social History   Tobacco Use  Smoking Status Former Smoker  . Years: 20.00  . Types: Pipe, Cigars  . Last attempt to quit: 11/10/1968  . Years since quitting: 49.5  Smokeless Tobacco Never Used    Goals Met:  Proper associated with RPD/PD & O2 Sat [place] with exercise equipment Improved SOB with ADL's Using PLB without cueing & demonstrates good technique Exercise tolerated well No report of cardiac concerns or symptoms Strength training completed today  Goals Unmet:  Not Applicable  Comments: Check out 1400.   Dr. Sinda Du is the Pulmonary Rehab Medical Director of [facility].

## 2018-05-27 ENCOUNTER — Encounter (HOSPITAL_COMMUNITY): Payer: Medicare Other

## 2018-05-27 ENCOUNTER — Encounter (HOSPITAL_COMMUNITY)
Admission: RE | Admit: 2018-05-27 | Discharge: 2018-05-27 | Disposition: A | Discharge status: Home / Self Care | Payer: Medicare Other | Source: Ambulatory Visit | Attending: Pulmonary Disease | Admitting: Pulmonary Disease

## 2018-05-27 DIAGNOSIS — J849 Interstitial pulmonary disease, unspecified: Secondary | ICD-10-CM

## 2018-05-27 NOTE — Progress Notes (Signed)
Daily Session Note  Patient Details  Name: Manuel Atkins MRN: [id number] Date of Birth: [birth year] Referring Provider:     PULMONARY REHAB OTHER RESP ORIENTATION from 03/03/2018 in [place]  Referring Provider  Dr. Vaughan Browner      Encounter Date: 05/27/2018  Check In: Session Check In - 05/27/18 1300      Check-In   Location  [facility]    Staff Present  Diane Angelina Pih, MS, EP, [facility], Exercise Physiologist;Malakhi Markwood Wynetta Emery, RN, BSN    Supervising physician immediately available to respond to emergencies  See telemetry face sheet for immediately available MD    Medication changes reported      No    Fall or balance concerns reported     Yes    Comments  Patient has a h/o of falls and reports balance issues.     Warm-up and Cool-down  Performed as group-led Higher education careers adviser Performed  Yes    VAD Patient?  No    PAD/SET Patient?  No      Pain Assessment   Currently in Pain?  No/denies    Pain Score  0-No pain    Multiple Pain Sites  No       Capillary Blood Glucose: No results found for this or any previous visit (from the past 24 hour(s)).    Social History   Tobacco Use  Smoking Status Former Smoker  . Years: 20.00  . Types: Pipe, Cigars  . Last attempt to quit: 11/10/1968  . Years since quitting: 49.5  Smokeless Tobacco Never Used    Goals Met:  Proper associated with RPD/PD & O2 Sat [place] with exercise equipment Improved SOB with ADL's Using PLB without cueing & demonstrates good technique Exercise tolerated well No report of cardiac concerns or symptoms Strength training completed today  Goals Unmet:  Not Applicable  Comments: Check out 1400.   Dr. Sinda Du is Medical Director for [facility] Pulmonary Rehab.

## 2018-05-28 ENCOUNTER — Encounter (HOSPITAL_COMMUNITY): Payer: Self-pay

## 2018-05-28 ENCOUNTER — Encounter (HOSPITAL_COMMUNITY)
Admission: RE | Admit: 2018-05-28 | Discharge: 2018-05-28 | Disposition: A | Discharge status: Home / Self Care | Payer: Medicare Other | Source: Ambulatory Visit | Attending: Internal Medicine | Admitting: Internal Medicine

## 2018-05-28 DIAGNOSIS — K5190 Ulcerative colitis, unspecified, without complications: Secondary | ICD-10-CM | POA: Diagnosis not present

## 2018-05-28 MED ORDER — LORATADINE 10 MG PO TABS: 10.0000 mg | ORAL_TABLET | Freq: Once | ORAL | Status: DC

## 2018-05-28 MED ORDER — SODIUM CHLORIDE 0.9 % IV SOLN
Freq: Once | INTRAVENOUS | Status: AC
  Administered 2018-05-28: 09:00:00 via INTRAVENOUS

## 2018-05-28 MED ORDER — ACETAMINOPHEN 325 MG PO TABS: 650.0000 mg | ORAL_TABLET | Freq: Once | ORAL | Status: DC

## 2018-05-28 MED ORDER — SODIUM CHLORIDE 0.9 % IV SOLN
5.0000 mg/kg | Freq: Once | INTRAVENOUS | Status: AC
  Administered 2018-05-28: 500 mg via INTRAVENOUS
  Filled 2018-05-28: qty 50

## 2018-05-28 MED ORDER — DIPHENHYDRAMINE HCL 25 MG PO CAPS: 50.0000 mg | ORAL_CAPSULE | Freq: Once | ORAL | Status: DC

## 2018-05-31 ENCOUNTER — Encounter (HOSPITAL_COMMUNITY): Payer: Medicare Other

## 2018-05-31 DIAGNOSIS — L570 Actinic keratosis: Secondary | ICD-10-CM | POA: Diagnosis not present

## 2018-05-31 DIAGNOSIS — Z85820 Personal history of malignant melanoma of skin: Secondary | ICD-10-CM | POA: Diagnosis not present

## 2018-06-01 ENCOUNTER — Encounter (HOSPITAL_COMMUNITY): Payer: Medicare Other

## 2018-06-03 ENCOUNTER — Encounter (HOSPITAL_COMMUNITY)
Admission: RE | Admit: 2018-06-03 | Discharge: 2018-06-03 | Disposition: A | Discharge status: Home / Self Care | Payer: Medicare Other | Source: Ambulatory Visit | Attending: Pulmonary Disease | Admitting: Pulmonary Disease

## 2018-06-03 DIAGNOSIS — J849 Interstitial pulmonary disease, unspecified: Secondary | ICD-10-CM | POA: Diagnosis not present

## 2018-06-03 DIAGNOSIS — B351 Tinea unguium: Secondary | ICD-10-CM | POA: Diagnosis not present

## 2018-06-03 DIAGNOSIS — E1140 Type 2 diabetes mellitus with diabetic neuropathy, unspecified: Secondary | ICD-10-CM | POA: Diagnosis not present

## 2018-06-03 DIAGNOSIS — L110 Acquired keratosis follicularis: Secondary | ICD-10-CM | POA: Diagnosis not present

## 2018-06-03 NOTE — Progress Notes (Signed)
Daily Session Note  Patient Details  Name: Manuel Atkins MRN: [id number] Date of Birth: [birth year] Referring Provider:     PULMONARY REHAB OTHER RESP ORIENTATION from 03/03/2018 in [place]  Referring Provider  Dr. Vaughan Browner      Encounter Date: 06/03/2018  Check In: Session Check In - 06/03/18 1445      Check-In   Supervising physician immediately available to respond to emergencies  See telemetry face sheet for immediately available MD    Location  [facility]    Staff Present  Russella Dar, MS, EP, [facility], Exercise Physiologist;Debra Wynetta Emery, RN, BSN    Medication changes reported      No    Fall or balance concerns reported     Yes    Comments  Patient has a h/o of falls and reports balance issues.     Warm-up and Cool-down  Performed as group-led Higher education careers adviser Performed  Yes    VAD Patient?  No    PAD/SET Patient?  No      Pain Assessment   Currently in Pain?  No/denies    Pain Score  0-No pain    Multiple Pain Sites  No       Capillary Blood Glucose: No results found for this or any previous visit (from the past 24 hour(s)).    Social History   Tobacco Use  Smoking Status Former Smoker  . Years: 20.00  . Types: Pipe, Cigars  . Last attempt to quit: 11/10/1968  . Years since quitting: 49.5  Smokeless Tobacco Never Used    Goals Met:  [place] with exercise equipment Improved SOB with ADL's Exercise tolerated well Personal goals reviewed No report of cardiac concerns or symptoms Strength training completed today  Goals Unmet:  Not Applicable  Comments: Check out: 1545   Dr. Sinda Du is Medical Director for [facility] Pulmonary Rehab.

## 2018-06-08 ENCOUNTER — Encounter (HOSPITAL_COMMUNITY)
Admission: RE | Admit: 2018-06-08 | Discharge: 2018-06-08 | Disposition: A | Discharge status: Home / Self Care | Payer: Medicare Other | Source: Ambulatory Visit | Attending: Pulmonary Disease | Admitting: Pulmonary Disease

## 2018-06-08 DIAGNOSIS — J849 Interstitial pulmonary disease, unspecified: Secondary | ICD-10-CM | POA: Diagnosis not present

## 2018-06-08 NOTE — Progress Notes (Signed)
Daily Session Note  Patient Details  Name: Manuel Atkins MRN: [id number] Date of Birth: Mar 14, 1933 Referring Provider:     PULMONARY REHAB OTHER RESP ORIENTATION from 03/03/2018 in [place]  Referring Provider  Dr. Vaughan Browner      Encounter Date: 06/08/2018  Check In: Session Check In - 06/08/18 1330      Check-In   Supervising physician immediately available to respond to emergencies  See telemetry face sheet for immediately available MD    Location  [facility]    Staff Present  Russella Dar, MS, EP, [facility], Exercise Physiologist    Medication changes reported      No    Fall or balance concerns reported     Yes    Comments  Patient has a h/o of falls and reports balance issues.     Tobacco Cessation  No Change    Warm-up and Cool-down  Performed as group-led instruction    Resistance Training Performed  Yes    VAD Patient?  No    PAD/SET Patient?  No      Pain Assessment   Currently in Pain?  No/denies    Pain Score  0-No pain    Multiple Pain Sites  No       Capillary Blood Glucose: No results found for this or any previous visit (from the past 24 hour(s)).    Social History   Tobacco Use  Smoking Status Former Smoker  . Years: 20.00  . Types: Pipe, Cigars  . Last attempt to quit: 11/10/1968  . Years since quitting: 49.6  Smokeless Tobacco Never Used    Goals Met:  Proper associated with RPD/PD & O2 Sat Independence with exercise equipment Improved SOB with ADL's Exercise tolerated well Personal goals reviewed Queuing for purse lip breathing No report of cardiac concerns or symptoms Strength training completed today  Goals Unmet:  Not Applicable  Comments: Check out: 1430   Dr. Sinda Du is Medical Director for [facility].

## 2018-06-10 ENCOUNTER — Encounter (HOSPITAL_COMMUNITY)
Admission: RE | Admit: 2018-06-10 | Discharge: 2018-06-10 | Disposition: A | Discharge status: Home / Self Care | Payer: Medicare Other | Source: Ambulatory Visit | Attending: Pulmonary Disease | Admitting: Pulmonary Disease

## 2018-06-10 DIAGNOSIS — J849 Interstitial pulmonary disease, unspecified: Secondary | ICD-10-CM | POA: Diagnosis not present

## 2018-06-10 NOTE — Progress Notes (Signed)
Daily Session Note  Patient Details  Name: Manuel Atkins MRN: [id number] Date of Birth: 1933/03/06 Referring Provider:     PULMONARY REHAB OTHER RESP ORIENTATION from 03/03/2018 in [place]  Referring Provider  Dr. Vaughan Browner      Encounter Date: 06/10/2018  Check In: Session Check In - 06/10/18 1330      Check-In   Supervising physician immediately available to respond to emergencies  See telemetry face sheet for immediately available MD    Location  [facility]    Staff Present  Russella Dar, MS, EP, [facility], Exercise Physiologist    Medication changes reported      No    Fall or balance concerns reported     Yes    Warm-up and Cool-down  Performed as group-led instruction    Resistance Training Performed  Yes    VAD Patient?  No    PAD/SET Patient?  No      Pain Assessment   Currently in Pain?  No/denies    Pain Score  0-No pain    Multiple Pain Sites  No       Capillary Blood Glucose: No results found for this or any previous visit (from the past 24 hour(s)).    Social History   Tobacco Use  Smoking Status Former Smoker  . Years: 20.00  . Types: Pipe, Cigars  . Last attempt to quit: 11/10/1968  . Years since quitting: 49.6  Smokeless Tobacco Never Used    Goals Met:  Proper associated with RPD/PD & O2 Sat Independence with exercise equipment Improved SOB with ADL's Using PLB without cueing & demonstrates good technique Exercise tolerated well Personal goals reviewed No report of cardiac concerns or symptoms Strength training completed today  Goals Unmet:  Not Applicable  Comments: Check out: 1430   Dr. Sinda Du is Medical Director for [facility] Pulmonary Rehab.

## 2018-06-15 ENCOUNTER — Encounter (HOSPITAL_COMMUNITY)
Admission: RE | Admit: 2018-06-15 | Discharge: 2018-06-15 | Disposition: A | Discharge status: Home / Self Care | Payer: Medicare Other | Source: Ambulatory Visit | Attending: Pulmonary Disease | Admitting: Pulmonary Disease

## 2018-06-15 DIAGNOSIS — J849 Interstitial pulmonary disease, unspecified: Secondary | ICD-10-CM | POA: Diagnosis not present

## 2018-06-15 NOTE — Progress Notes (Signed)
Daily Session Note  Patient Details  Name: Manuel Atkins MRN: [id number] Date of Birth: 26-Jun-1933 Referring Provider:     PULMONARY REHAB OTHER RESP ORIENTATION from 03/03/2018 in [place]  Referring Provider  Dr. Vaughan Browner      Encounter Date: 06/15/2018  Check In: Session Check In - 06/15/18 1330      Check-In   Supervising physician immediately available to respond to emergencies  See telemetry face sheet for immediately available MD    Location  [facility]    Staff Present  Russella Dar, MS, EP, [facility], Exercise Physiologist    Medication changes reported      No    Fall or balance concerns reported     Yes    Tobacco Cessation  No Change    Warm-up and Cool-down  Performed as group-led instruction    Resistance Training Performed  Yes    VAD Patient?  No    PAD/SET Patient?  No      Pain Assessment   Currently in Pain?  No/denies    Pain Score  0-No pain    Multiple Pain Sites  No       Capillary Blood Glucose: No results found for this or any previous visit (from the past 24 hour(s)).    Social History   Tobacco Use  Smoking Status Former Smoker  . Years: 20.00  . Types: Pipe, Cigars  . Last attempt to quit: 11/10/1968  . Years since quitting: 49.6  Smokeless Tobacco Never Used    Goals Met:  Proper associated with RPD/PD & O2 Sat Independence with exercise equipment Improved SOB with ADL's Exercise tolerated well Personal goals reviewed No report of cardiac concerns or symptoms Strength training completed today  Goals Unmet:  Not Applicable  Comments: Check out: 1430   Dr. Sinda Du is Medical Director for [facility] Pulmonary Rehab.

## 2018-06-17 ENCOUNTER — Encounter (HOSPITAL_COMMUNITY)
Admission: RE | Admit: 2018-06-17 | Discharge: 2018-06-17 | Disposition: A | Discharge status: Home / Self Care | Payer: Medicare Other | Source: Ambulatory Visit | Attending: Pulmonary Disease | Admitting: Pulmonary Disease

## 2018-06-17 DIAGNOSIS — J849 Interstitial pulmonary disease, unspecified: Secondary | ICD-10-CM

## 2018-06-17 NOTE — Progress Notes (Signed)
Daily Session Note  Patient Details  Name: Manuel Atkins MRN: [id number] Date of Birth: [birth year] Referring Provider:     PULMONARY REHAB OTHER RESP ORIENTATION from 03/03/2018 in [place]  Referring Provider  Dr. Vaughan Browner      Encounter Date: 06/17/2018  Check In: Session Check In - 06/17/18 1330      Check-In   Supervising physician immediately available to respond to emergencies  See telemetry face sheet for immediately available MD    Location  [facility]    Staff Present  Russella Dar, MS, EP, [facility], Exercise Physiologist;Ambre Kobayashi Wynetta Emery, RN, BSN    Medication changes reported      No    Fall or balance concerns reported     Yes    Comments  Patient has a h/o of falls and reports balance issues.     Warm-up and Cool-down  Performed as group-led Higher education careers adviser Performed  Yes    VAD Patient?  No    PAD/SET Patient?  No      Pain Assessment   Currently in Pain?  No/denies    Pain Score  0-No pain    Multiple Pain Sites  No       Capillary Blood Glucose: No results found for this or any previous visit (from the past 24 hour(s)).    Social History   Tobacco Use  Smoking Status Former Smoker  . Years: 20.00  . Types: Pipe, Cigars  . Last attempt to quit: 11/10/1968  . Years since quitting: 49.6  Smokeless Tobacco Never Used    Goals Met:  Proper associated with RPD/PD & O2 Sat Independence with exercise equipment Improved SOB with ADL's Using PLB without cueing & demonstrates good technique Exercise tolerated well No report of cardiac concerns or symptoms Strength training completed today  Goals Unmet:  Not Applicable  Comments: Check out 1430   Dr. Sinda Du is Medical Director for [facility] Pulmonary Rehab.

## 2018-06-19 NOTE — Progress Notes (Signed)
Pulmonary Individual Treatment Plan  Patient Details  Name: Manuel Atkins MRN: [id number] Date of Birth: [birth year] Referring Provider:     PULMONARY REHAB OTHER RESP ORIENTATION from 03/03/2018 in [place]  Referring Provider  Dr. Vaughan Browner      Initial Encounter Date:    [place] from 03/03/2018 in [place]  Date  03/03/18      Visit Diagnosis: ILD (interstitial lung disease) ([place])  Patient's Home Medications on Admission:   Current Outpatient Medications:  .  acetaminophen (TYLENOL) 500 MG tablet, Take 1,000 mg by mouth every 6 (six) hours as needed for moderate pain or headache., Disp: , Rfl:  .  ALPRAZolam (XANAX) 0.25 MG tablet, Take 1 tablet (0.25 mg total) by mouth 3 (three) times daily as needed. for anxiety (Patient taking differently: Take 0.125 mg by mouth 3 (three) times daily as needed for anxiety. ), Disp: 90 tablet, Rfl: 2 .  amLODipine (NORVASC) 2.5 MG tablet, Take 2.5 mg by mouth daily., Disp: , Rfl:  .  aspirin EC 81 MG tablet, Take 81 mg by mouth daily., Disp: , Rfl:  .  imipramine (TOFRANIL) 25 MG tablet, Take 75 mg by mouth at bedtime, Disp: , Rfl: 0 .  latanoprost (XALATAN) 0.005 % ophthalmic solution, Place 1 drop into both eyes at bedtime., Disp: , Rfl: 6 .  LIALDA 1.2 g EC tablet, TAKE TWO (2) TABLETS BY MOUTH TWICE DAILY, Disp: 360 tablet, Rfl: 4 .  metFORMIN (GLUCOPHAGE-XR) 500 MG 24 hr tablet, Take 1,000 mg by mouth at bedtime., Disp: , Rfl: 1 .  nitroGLYCERIN (NITROSTAT) 0.4 MG SL tablet, Place 0.4 mg under the tongue every 5 (five) minutes as needed for chest pain. , Disp: , Rfl: 1 .  pantoprazole (PROTONIX) 40 MG tablet, TAKE ONE TABLET BY MOUTH EVERY *OTHER* DAY, Disp: 30 tablet, Rfl: 5 .  Probiotic Product (PROBIOTIC DAILY PO), Take 1 capsule by mouth daily. , Disp: , Rfl:  .  rosuvastatin (CRESTOR) 20 MG tablet, Take 20 mg by mouth at bedtime., Disp: , Rfl:  .  tamsulosin  (FLOMAX) 0.4 MG CAPS capsule, Take 0.4 mg by mouth at bedtime., Disp: , Rfl:   Past Medical History: Past Medical History:  Diagnosis Date  . 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 Dyspnea   . Essential hypertension, benign   . GERD (gastroesophageal reflux disease)   . Headache(784.0)   . History of colon polyps   . History of transfusion of whole blood   . Interstitial lung disease ([place]) 12/01/2017  . Mixed hyperlipidemia   . Obstructive sleep apnea (adult) (pediatric)    uses bipap @ HS  . Other malaise and fatigue   . Thrombocytopenia ([address]) 11/10/2012  . Type II or unspecified type diabetes mellitus without mention of complication, uncontrolled   . 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 Years: 20.00  . Types: Pipe, Cigars  . Last attempt to quit: 11/10/1968  . Years since quitting: 49.6  Smokeless Tobacco Never Used    Labs: Recent Chemical engineer    Labs for ITP Cardiac and Pulmonary Rehab Latest Ref Rng & Units 11/07/2012 04/20/2018 04/20/2018   Hemoglobin A1c <5.7 % 6.2(H) - -   PHART 7.350 - 7.450 - - 7.304(L)   PCO2ART 32.0 - 48.0 mmHg - - 42.2   HCO3 20.0 - 28.0 mmol/L - 22.3  21.0   TCO2 22 - 32 mmol/L - 24 22   ACIDBASEDEF 0.0 - 2.0 mmol/L - 4.0(H) 5.0(H)   O2SAT % - 63.0 93.0      Capillary Blood Glucose: Lab Results  Component Value Date   GLUCAP 140 (H) 04/20/2018   GLUCAP 168 (H) 04/20/2018   GLUCAP 206 (H) 04/20/2018   GLUCAP 139 (H) 12/15/2017   GLUCAP 133 (H) 06/16/2013     Pulmonary Assessment Scores: Pulmonary Assessment Scores    Row Name 03/03/18 1440         ADL UCSD   ADL Phase  Entry     SOB Score total  69     Rest  0     Walk  6     Stairs  4     Bath  4     Dress  4     Shop  3       CAT Score   CAT Score  18       mMRC Score   mMRC Score  3        Pulmonary Function Assessment: Pulmonary Function Assessment - 03/03/18 1452       Pulmonary Function Tests   FVC%  58 %    FEV1%  67 %    FEV1/FVC Ratio  82    DLCO%  14.81 %      Post Bronchodilator Spirometry Results   FVC%  54 %    FEV1%  66 %    FEV1/FVC Ratio  86       Exercise Target Goals: Exercise Program Goal: Individual exercise prescription set using results from initial 6 min walk test and THRR while considering  patient's activity barriers and safety.   Exercise Prescription Goal: Initial exercise prescription builds to 30-45 minutes a day of aerobic activity, 2-3 days per week.  Home exercise guidelines will be given to patient during program as part of exercise prescription that the participant will acknowledge.  Activity Barriers & Risk Stratification: Activity Barriers & Cardiac Risk Stratification - 03/03/18 1405      Activity Barriers & Cardiac Risk Stratification   Activity Barriers  Shortness of Breath    Cardiac Risk Stratification  High       6 Minute Walk: 6 Minute Walk    Row Name 03/03/18 1404         6 Minute Walk   Phase  Initial     Distance  1100 feet     Distance % Change  0 %     Distance Feet Change  0 ft     Walk Time  6 minutes     # of Rest Breaks  0     MPH  2.08     METS  2.59     RPE  12     Perceived Dyspnea   14     VO2 Peak  7.16     Symptoms  No     Resting HR  77 bpm     Resting BP  154/74     Resting Oxygen Saturation   98 %     Exercise Oxygen Saturation  during 6 min walk  90 %     Max Ex. HR  79 bpm     Max Ex. BP  160/74     2 Minute Post BP  144/74        Oxygen Initial Assessment: Oxygen Initial Assessment - 03/03/18 1402  Home Oxygen   Home Oxygen Device  None    Sleep Oxygen Prescription  BiPAP    Home Exercise Oxygen Prescription  None    Home at Rest Exercise Oxygen Prescription  None      Initial 6 min Walk   Oxygen Used  None      Program Oxygen Prescription   Program Oxygen Prescription  None      Intervention   Short Term Goals  To learn and understand  importance of monitoring SPO2 with pulse oximeter and demonstrate accurate use of the pulse oximeter.;To learn and understand importance of maintaining oxygen saturations>88%;To learn and demonstrate proper pursed lip breathing techniques or other breathing techniques.    Long  Term Goals  Verbalizes importance of monitoring SPO2 with pulse oximeter and return demonstration;Maintenance of O2 saturations>88%;Exhibits proper breathing techniques, such as pursed lip breathing or other method taught during program session       Oxygen Re-Evaluation: Oxygen Re-Evaluation    Row Name 04/01/18 1553 04/23/18 1457 05/20/18 1453 06/17/18 1553       Program Oxygen Prescription   Program Oxygen Prescription  None  None  None  None      Home Oxygen   Home Oxygen Device  None  None  None  None    Sleep Oxygen Prescription  BiPAP  BiPAP  BiPAP  BiPAP    Liters per minute  0  0  0  0    Home Exercise Oxygen Prescription  None  None  None  None    Home at Rest Exercise Oxygen Prescription  None  None  None  None    Compliance with Home Oxygen Use  -  -  -  Yes      Goals/Expected Outcomes   Short Term Goals  To learn and understand importance of monitoring SPO2 with pulse oximeter and demonstrate accurate use of the pulse oximeter.;To learn and understand importance of maintaining oxygen saturations>88%;To learn and demonstrate proper pursed lip breathing techniques or other breathing techniques.  To learn and understand importance of monitoring SPO2 with pulse oximeter and demonstrate accurate use of the pulse oximeter.;To learn and understand importance of maintaining oxygen saturations>88%;To learn and demonstrate proper pursed lip breathing techniques or other breathing techniques.  To learn and understand importance of monitoring SPO2 with pulse oximeter and demonstrate accurate use of the pulse oximeter.;To learn and understand importance of maintaining oxygen saturations>88%;To learn and demonstrate  proper pursed lip breathing techniques or other breathing techniques.  To learn and understand importance of monitoring SPO2 with pulse oximeter and demonstrate accurate use of the pulse oximeter.;To learn and understand importance of maintaining oxygen saturations>88%;To learn and demonstrate proper pursed lip breathing techniques or other breathing techniques.    Long  Term Goals  Verbalizes importance of monitoring SPO2 with pulse oximeter and return demonstration;Maintenance of O2 saturations>88%;Exhibits proper breathing techniques, such as pursed lip breathing or other method taught during program session  Verbalizes importance of monitoring SPO2 with pulse oximeter and return demonstration;Maintenance of O2 saturations>88%;Exhibits proper breathing techniques, such as pursed lip breathing or other method taught during program session  Verbalizes importance of monitoring SPO2 with pulse oximeter and return demonstration;Maintenance of O2 saturations>88%;Exhibits proper breathing techniques, such as pursed lip breathing or other method taught during program session  Verbalizes importance of monitoring SPO2 with pulse oximeter and return demonstration;Maintenance of O2 saturations>88%;Exhibits proper breathing techniques, such as pursed lip breathing or other method taught during program session  Comments  Patient demonstrates proper pursed lip breathing techniques during exercise and also demonstrates proper usage of pulse oximeter. He is able to verbalize the importance of maintaining his O2 saturations >88%. Will continue to monitor.   Patient demonstrates proper pursed lip breathing techniques during exercise and also demonstrates proper usage of pulse oximeter. He is able to verbalize the importance of maintaining his O2 saturations >88%. Will continue to monitor.   Patient demonstrates proper pursed lip breathing techniques during exercise and also demonstrates proper usage of pulse oximeter. He is  able to verbalize the importance of maintaining his O2 saturations >88%. Will continue to monitor.   Patient demonstrates proper pursed lip breathing techniques during exercise and also demonstrates proper usage of pulse oximeter. He is able to verbalize the importance of maintaining his O2 saturations >88%. Will continue to monitor.     Goals/Expected Outcomes  Patient will continue to meet his short and long term goals.   Patient will continue to meet his short and long term goals.   Patient will continue to meet his short and long term goals.   Patient will continue to meet his short and long term goals.        Oxygen Discharge (Final Oxygen Re-Evaluation): Oxygen Re-Evaluation - 06/17/18 1553      Program Oxygen Prescription   Program Oxygen Prescription  None      Home Oxygen   Home Oxygen Device  None    Sleep Oxygen Prescription  BiPAP    Liters per minute  0    Home Exercise Oxygen Prescription  None    Home at Rest Exercise Oxygen Prescription  None    Compliance with Home Oxygen Use  Yes      Goals/Expected Outcomes   Short Term Goals  To learn and understand importance of monitoring SPO2 with pulse oximeter and demonstrate accurate use of the pulse oximeter.;To learn and understand importance of maintaining oxygen saturations>88%;To learn and demonstrate proper pursed lip breathing techniques or other breathing techniques.    Long  Term Goals  Verbalizes importance of monitoring SPO2 with pulse oximeter and return demonstration;Maintenance of O2 saturations>88%;Exhibits proper breathing techniques, such as pursed lip breathing or other method taught during program session    Comments  Patient demonstrates proper pursed lip breathing techniques during exercise and also demonstrates proper usage of pulse oximeter. He is able to verbalize the importance of maintaining his O2 saturations >88%. Will continue to monitor.     Goals/Expected Outcomes  Patient will continue to meet his  short and long term goals.        Initial Exercise Prescription: Initial Exercise Prescription - 03/03/18 1400      Date of Initial Exercise RX and Referring Provider   Date  03/03/18    Referring Provider  Dr. Vaughan Browner      Treadmill   MPH  1.3    Grade  0    Minutes  15    METs  1.9      NuStep   Level  1    SPM  74    Minutes  20    METs  1.9      Prescription Details   Frequency (times per week)  3    Duration  Progress to 30 minutes of continuous aerobic without signs/symptoms of physical distress      Intensity   THRR 40-80% of Max Heartrate  101-113-126    Ratings of Perceived Exertion  11-13  Perceived Dyspnea  0-4      Progression   Progression  Continue progressive overload as per policy without signs/symptoms or physical distress.      Resistance Training   Training Prescription  Yes    Weight  1    Reps  10-15       Perform Capillary Blood Glucose checks as needed.  Exercise Prescription Changes:  Exercise Prescription Changes    Row Name 03/11/18 1400 03/16/18 1000 03/30/18 0700 04/12/18 1400 04/28/18 1400     Response to Exercise   Blood Pressure (Admit)  -  138/68  130/70  130/60  120/68   Blood Pressure (Exercise)  -  160/70  150/74  150/68  150/62   Blood Pressure (Exit)  -  144/70  134/68  128/66  110/64   Heart Rate (Admit)  -  91 bpm  84 bpm  91 bpm  87 bpm   Heart Rate (Exercise)  -  73 bpm  99 bpm  91 bpm  80 bpm   Heart Rate (Exit)  -  82 bpm  89 bpm  91 bpm  95 bpm   Oxygen Saturation (Admit)  -  97 %  93 %  91 %  96 %   Oxygen Saturation (Exercise)  -  98 %  94 %  90 %  92 %   Oxygen Saturation (Exit)  -  91 %  90 %  97 %  93 %   Rating of Perceived Exertion (Exercise)  -  12  12  11  12    Perceived Dyspnea (Exercise)  -  12  12  11  13    Duration  -  Progress to 30 minutes of  aerobic without signs/symptoms of physical distress  Progress to 30 minutes of  aerobic without signs/symptoms of physical distress  Progress to 30 minutes  of  aerobic without signs/symptoms of physical distress  Progress to 30 minutes of  aerobic without signs/symptoms of physical distress   Intensity  -  THRR New 110-119-129  THRR New 106-116-127  THRR New 110-119-129  THRR New 107-118-128     Progression   Progression  -  Continue to progress workloads to maintain intensity without signs/symptoms of physical distress.  Continue to progress workloads to maintain intensity without signs/symptoms of physical distress.  Continue to progress workloads to maintain intensity without signs/symptoms of physical distress.  Continue to progress workloads to maintain intensity without signs/symptoms of physical distress.     Resistance Training   Training Prescription  Yes  Yes  Yes  Yes  Yes   Weight  1  1  2  2  3    Reps  10-15  10-15  10-15  10-15  10-15     Treadmill   MPH  1.3  1.3  1.5  1.7  1.7   Grade  0  0  0  0  0   Minutes  15  15  15  15  15    METs  1.9  1.9  2.15  2.3  2.3     NuStep   Level  1  1  1  2  2    SPM  74  95  92  125  115   Minutes  20  20  20  20  20    METs  1.9  1.9  1.8  1.8  1.9     Home Exercise Plan   Plans to continue exercise at  Home (comment)  Home (comment)  Home (comment)  Home (comment)  Home (comment)   Frequency  Add 2 additional days to program exercise sessions.  Add 2 additional days to program exercise sessions.  Add 2 additional days to program exercise sessions.  Add 2 additional days to program exercise sessions.  Add 2 additional days to program exercise sessions.   Initial Home Exercises Provided  03/11/18  03/11/18  03/11/18  03/11/18  03/11/18   Row Name 05/11/18 0800 05/30/18 1600 06/19/18 1200         Response to Exercise   Blood Pressure (Admit)  128/66  120/64  138/70     Blood Pressure (Exercise)  146/68  170/78  180/70     Blood Pressure (Exit)  128/66  130/66  140/70     Heart Rate (Admit)  102 bpm  99 bpm  86 bpm     Heart Rate (Exercise)  88 bpm  101 bpm  106 bpm     Heart Rate  (Exit)  97 bpm  98 bpm  87 bpm     Oxygen Saturation (Admit)  96 %  99 %  94 %     Oxygen Saturation (Exercise)  90 %  99 %  90 %     Oxygen Saturation (Exit)  90 %  97 %  94 %     Rating of Perceived Exertion (Exercise)  12  11  12      Perceived Dyspnea (Exercise)  13  11  11      Duration  Progress to 30 minutes of  aerobic without signs/symptoms of physical distress  Progress to 30 minutes of  aerobic without signs/symptoms of physical distress  Progress to 30 minutes of  aerobic without signs/symptoms of physical distress     Intensity  THRR New 116-124-131  THRR New 114-122-130  THRR New 106-117-127       Progression   Progression  Continue to progress workloads to maintain intensity without signs/symptoms of physical distress.  Continue to progress workloads to maintain intensity without signs/symptoms of physical distress.  Continue to progress workloads to maintain intensity without signs/symptoms of physical distress.     Average METs  -  2.1  2.16       Resistance Training   Training Prescription  Yes  Yes  Yes     Weight  3  3  4      Reps  10-15  10-15  10-15     Time  -  5 Minutes  5 Minutes       Treadmill   MPH  1.7  1.7  2     Grade  0  0  0     Minutes  15  15  17      METs  2.3  2.3  2.53       NuStep   Level  2  2  3      SPM  97  118  123     Minutes  20  20  22      METs  2  1.9  1.8       Home Exercise Plan   Plans to continue exercise at  Home (comment)  Home (comment)  Home (comment)     Frequency  Add 2 additional days to program exercise sessions.  Add 2 additional days to program exercise sessions.  Add 2 additional days to program exercise sessions.     Initial Home Exercises Provided  03/11/18  03/11/18  03/11/18        Exercise Comments:  Exercise Comments    Row Name 03/11/18 1448 03/16/18 1027 03/30/18 0748 04/12/18 1449 04/28/18 1416   Exercise Comments  Patient recieved the take home exercise plan today 03/11/2018. THR was addressed as were safety  guidelines for being active when not here in PR. Patient demonstrated an understanding and was encouraged to ask any future questions as they arise.   Patient has been doing well in PR. Patient has just started the program and is handling the initial exercise progression well. Patient will be progressed in time.   Patient is doing well in PR. Patient has increased his speed on the treadmill and his weights for the warm up portion of the session have increased to 2lbs. Patient has stated to me that he feels some more strength. Patient has still only been for PR for 6 sessions. Patient will be progressed more over time to meet his goals.    Patient is doing well in PR. Patient has increased his level on the nustep machine and increased his speed on the treadmill to 1.7. Patient states that he feels more energy to do tasks around the house.   Patient has been doing well in PR, increasing his weight on the warm up portion of the sessions to 3lbs and maintaining his levels on the machines. Patient has not attended class since 04/15/2018 so no more progression has yet been made.    [place] Name 05/11/18 0818 06/19/18 1258         Exercise Comments  Patient is doing well in PR. 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 Patient will be progressed in time.   Patient is progressing at a steady rate. He is breathing better and increasing in weights and equipment workloads.          Exercise Goals and Review:  Exercise Goals    Row Name 03/03/18 1406             Exercise Goals   Increase Physical Activity  Yes       Intervention  Provide advice, education, support and counseling about physical activity/exercise needs.;Develop an individualized exercise prescription for aerobic and resistive training based on initial evaluation findings, risk stratification, comorbidities and participant's personal goals.       Expected Outcomes  Short Term: Attend rehab on a regular  basis to increase amount of physical activity.       Increase Strength and Stamina  Yes       Intervention  Provide advice, education, support and counseling about physical activity/exercise needs.;Develop an individualized exercise prescription for aerobic and resistive training based on initial evaluation findings, risk stratification, comorbidities and participant's personal goals.       Expected Outcomes  Short Term: Increase workloads from initial exercise prescription for resistance, speed, and METs.       Able to understand and use rate of perceived exertion (RPE) scale  Yes       Intervention  Provide education and explanation on how to use RPE scale       Expected Outcomes  Short Term: Able to use RPE daily in rehab to express subjective intensity level;Long Term:  Able to use RPE to guide intensity level when exercising independently       Able to understand and use Dyspnea scale  Yes       Intervention  Provide education and explanation  on how to use Dyspnea scale       Expected Outcomes  Short Term: Able to use Dyspnea scale daily in rehab to express subjective sense of shortness of breath during exertion;Long Term: Able to use Dyspnea scale to guide intensity level when exercising independently       Knowledge and understanding of Target Heart Rate Range (THRR)  Yes       Intervention  Provide education and explanation of THRR including how the numbers were predicted and where they are located for reference       Expected Outcomes  Short Term: Able to state/look up THRR;Long Term: Able to use THRR to govern intensity when exercising independently;Short Term: Able to use daily as guideline for intensity in rehab       Able to check pulse independently  Yes       Intervention  Provide education and demonstration on how to check pulse in carotid and radial arteries.;Review the importance of being able to check your own pulse for safety during independent exercise       Expected Outcomes   Short Term: Able to explain why pulse checking is important during independent exercise;Long Term: Able to check pulse independently and accurately       Understanding of Exercise Prescription  Yes       Intervention  Provide education, explanation, and written materials on patient's individual exercise prescription       Expected Outcomes  Short Term: Able to explain program exercise prescription;Long Term: Able to explain home exercise prescription to exercise independently          Exercise Goals Re-Evaluation : Exercise Goals Re-Evaluation    Row Name 03/30/18 0746 04/20/18 0831 05/17/18 1511 06/19/18 1248       Exercise Goal Re-Evaluation   Exercise Goals Review  Increase Physical Activity;Increase Strength and Stamina;Able to check pulse independently;Able to understand and use rate of perceived exertion (RPE) scale;Knowledge and understanding of Target Heart Rate Range (THRR);Able to understand and use Dyspnea scale;Understanding of Exercise Prescription  Increase Physical Activity;Increase Strength and Stamina;Able to check pulse independently;Able to understand and use rate of perceived exertion (RPE) scale;Knowledge and understanding of Target Heart Rate Range (THRR);Able to understand and use Dyspnea scale;Understanding of Exercise Prescription  Increase Physical Activity;Increase Strength and Stamina;Able to check pulse independently;Able to understand and use rate of perceived exertion (RPE) scale;Knowledge and understanding of Target Heart Rate Range (THRR);Able to understand and use Dyspnea scale;Understanding of Exercise Prescription  Increase Strength and Stamina;Able to understand and use rate of perceived exertion (RPE) scale;Able to understand and use Dyspnea scale;Knowledge and understanding of Target Heart Rate Range (THRR);Understanding of Exercise Prescription    Comments  Patient is doing well in PR. Patient has increased his speed on the treadmill and his weights for the warm  up portion of the session have increased to 2lbs. Patient has stated to me that he feels some more strength. Patient has still only been for PR for 6 sessions. Patient will be progressed more over time to meet his goals.    Patient has been doing very well in PR. Patient has stated to me that he feels much more energy and strength from the program. Patient has become less SOB he states. Patient has been doing well on his nustep and has been progressed to level 2 on the machine.   Patient has been doing very well in CR. Patient has just recently returned from getting several tests done that have hindered  his attendance. Patient has stated to me that he has much better strength since beginning the program however he is still working on being able to breathe better and working on breathing techniques.   Patient has been able to handle increases in weights and equipment workloads. His blood sugar readings fasting is still high. Patient feels he is breathing better. He is not sure this is making him stronger. We reassure him that he is improving based on his being able to handle increases in his workloads. Patient has lost 4lbs from start of program. Has not lost any more and is not gaining any weight either. Weight staying between 206-208lbs.     Expected Outcomes  Patient wishes to lose weight, breathe better, and to gain strength.   Patient wishes to lose weight, breathe better, and to gain strength.   Patient wishes to lose weight, breathe better, and to gain strength.   More DM control, breath better, lose weight and improve strength       Discharge Exercise Prescription (Final Exercise Prescription Changes): Exercise Prescription Changes - 06/19/18 1200      Response to Exercise   Blood Pressure (Admit)  138/70    Blood Pressure (Exercise)  180/70    Blood Pressure (Exit)  140/70    Heart Rate (Admit)  86 bpm    Heart Rate (Exercise)  106 bpm    Heart Rate (Exit)  87 bpm    Oxygen Saturation (Admit)   94 %    Oxygen Saturation (Exercise)  90 %    Oxygen Saturation (Exit)  94 %    Rating of Perceived Exertion (Exercise)  12    Perceived Dyspnea (Exercise)  11    Duration  Progress to 30 minutes of  aerobic without signs/symptoms of physical distress    Intensity  THRR New   [id number]     Progression   Progression  Continue to progress workloads to maintain intensity without signs/symptoms of physical distress.    Average METs  2.16      Resistance Training   Training Prescription  Yes    Weight  4    Reps  10-15    Time  5 Minutes      Treadmill   MPH  2    Grade  0    Minutes  17    METs  2.53      NuStep   Level  3    SPM  123    Minutes  22    METs  1.8      Home Exercise Plan   Plans to continue exercise at  Home (comment)    Frequency  Add 2 additional days to program exercise sessions.    Initial Home Exercises Provided  03/11/18       Nutrition:  Target Goals: Understanding of nutrition guidelines, daily intake of sodium <1551m, cholesterol <2014m calories 30% from fat and 7% or less from saturated fats, daily to have 5 or more servings of fruits and vegetables.  Biometrics: Pre Biometrics - 03/03/18 1406      Pre Biometrics   Height  5' 11"  (1.803 m)    Weight  210 lb (95.3 kg)    Waist Circumference  42 inches    Hip Circumference  43 inches    Waist to Hip Ratio  0.98 %    BMI (Calculated)  29.3    Triceps Skinfold  9 mm    % Body Fat  27.2 %  Grip Strength  62.29 kg    Flexibility  0 in    Single Leg Stand  2 seconds        Nutrition Therapy Plan and Nutrition Goals: Nutrition Therapy & Goals - 03/11/18 1616      Personal Nutrition Goals   Nutrition Goal  For heart healthy choices add >50% of whole grains, make half their plate fruits and vegetables. Discuss the difference between starchy vegetables and leafy greens, and how leafy vegetables provide fiber, helps maintain healthy weight, helps control blood glucose, and lowers  cholesterol.  Discuss purchasing fresh or frozen vegetable to reduce sodium and not to add grease, fat or sugar. Consume <18oz of red meat per week. Consume lean cuts of meats and very little of meats high in sodium and nitrates such as pork and lunch meats. Discussed portion control for all food groups.     Additional Goals?  No      Intervention Plan   Intervention  Nutrition handout(s) given to patient.    Expected Outcomes  Short Term Goal: Understand basic principles of dietary content, such as calories, fat, sodium, cholesterol and nutrients.       Nutrition Assessments: Nutrition Assessments - 03/03/18 1443      MEDFICTS Scores   Pre Score  36       Nutrition Goals Re-Evaluation: Nutrition Goals Re-Evaluation    [place] Name 04/01/18 1555 04/23/18 1458 06/17/18 1553         Goals   Current Weight  207 lb 11.2 oz (94.2 kg)  207 lb 12.8 oz (94.3 kg)  208 lb (94.3 kg)     Nutrition Goal  For heart healthy choices add >50% of whole grains, make half their plate fruits and vegetables. Discuss the difference between starchy vegetables and leafy greens, and how leafy vegetables provide fiber, helps maintain healthy weight, helps control blood glucose, and lowers cholesterol.  Discuss purchasing fresh or frozen vegetable to reduce sodium and not to add grease, fat or sugar. Consume <18oz of red meat per week. Consume lean cuts of meats and very little of meats high in sodium and nitrates such as pork and lunch meats. Discussed portion control for all food groups.   For heart healthy choices add >50% of whole grains, make half their plate fruits and vegetables. Discuss the difference between starchy vegetables and leafy greens, and how leafy vegetables provide fiber, helps maintain healthy weight, helps control blood glucose, and lowers cholesterol.  Discuss purchasing fresh or frozen vegetable to reduce sodium and not to add grease, fat or sugar. Consume <18oz of red meat per week. Consume lean  cuts of meats and very little of meats high in sodium and nitrates such as pork and lunch meats. Discussed portion control for all food groups.   For heart healthy choices add >50% of whole grains, make half their plate fruits and vegetables. Discuss the difference between starchy vegetables and leafy greens, and how leafy vegetables provide fiber, helps maintain healthy weight, helps control blood glucose, and lowers cholesterol.  Discuss purchasing fresh or frozen vegetable to reduce sodium and not to add grease, fat or sugar. Consume <18oz of red meat per week. Consume lean cuts of meats and very little of meats high in sodium and nitrates such as pork and lunch meats. Discussed portion control for all food groups.      Comment  Patient has lost 2.3 lbs since last 30 day review. He continues to say he is following a  heart healthy diet. Will continue to monitor for progress.   Patient has maintained his weight since last 30 day review. He continues to say he is following a heart healthy diet. Will continue to monitor for progress.   Patient has gained 1 lb since last 30 day review. She says he is eating heart healthy. Will continue to monitor for progress.      Expected Outcome  Patient will continue to meet his nutrition goals.   Patient will continue to meet his nutrition goals.   Patient will continue to meet his nutrition goals.         Nutrition Goals Discharge (Final Nutrition Goals Re-Evaluation): Nutrition Goals Re-Evaluation - 06/17/18 1553      Goals   Current Weight  208 lb (94.3 kg)    Nutrition Goal  For heart healthy choices add >50% of whole grains, make half their plate fruits and vegetables. Discuss the difference between starchy vegetables and leafy greens, and how leafy vegetables provide fiber, helps maintain healthy weight, helps control blood glucose, and lowers cholesterol.  Discuss purchasing fresh or frozen vegetable to reduce sodium and not to add grease, fat or sugar. Consume  <18oz of red meat per week. Consume lean cuts of meats and very little of meats high in sodium and nitrates such as pork and lunch meats. Discussed portion control for all food groups.     Comment  Patient has gained 1 lb since last 30 day review. She says he is eating heart healthy. Will continue to monitor for progress.     Expected Outcome  Patient will continue to meet his nutrition goals.        Psychosocial: Target Goals: Acknowledge presence or absence of significant depression and/or stress, maximize coping skills, provide positive support system. Participant is able to verbalize types and ability to use techniques and skills needed for reducing stress and depression.  Initial Review & Psychosocial Screening: Initial Psych Review & Screening - 03/03/18 1445      Initial Review   Current issues with  None Identified      Family Dynamics   Good Support System?  Yes    Comments  Patient initial QOL score was 27.44 and his PHQ-9 score was 7 with no psychosocial issues identified.       Barriers   Psychosocial barriers to participate in program  There are no identifiable barriers or psychosocial needs.      Screening Interventions   Interventions  Encouraged to exercise    Expected Outcomes  Short Term goal: Identification and review with participant of any Quality of Life or Depression concerns found by scoring the questionnaire.;Long Term goal: The participant improves quality of Life and PHQ9 Scores as seen by post scores and/or verbalization of changes       Quality of Life Scores: Quality of Life - 03/03/18 1239      Quality of Life Scores   Health/Function Pre  24.88 %    Socioeconomic Pre  29.58 %    Psych/Spiritual Pre  29.64 %    Family Pre  30 %    GLOBAL Pre  27.44 %      Scores of 19 and below usually indicate a poorer quality of life in these areas.  A difference of  2-3 points is a clinically meaningful difference.  A difference of 2-3 points in the total score  of the Quality of Life Index has been associated with significant improvement in overall quality of life,  self-image, physical symptoms, and general health in studies assessing change in quality of life.   PHQ-9: Recent Review Flowsheet Data    Depression screen [facility] 2/9 03/03/2018   Decreased Interest 0   Down, Depressed, Hopeless 0   PHQ - 2 Score 0   Altered sleeping 0   Tired, decreased energy 3   Change in appetite 3   Feeling bad or failure about yourself  0   Trouble concentrating 1   Moving slowly or fidgety/restless 0   Suicidal thoughts 0   PHQ-9 Score 7   Difficult doing work/chores Not difficult at all     Interpretation of Total Score  Total Score Depression Severity:  1-4 = Minimal depression, 5-9 = Mild depression, 10-14 = Moderate depression, 15-19 = Moderately severe depression, 20-27 = Severe depression   Psychosocial Evaluation and Intervention: Psychosocial Evaluation - 03/03/18 1446      Psychosocial Evaluation & Interventions   Interventions  Relaxation education;Stress management education;Encouraged to exercise with the program and follow exercise prescription    Comments  There are no psychosocial issues identified at orientation.     Expected Outcomes  Patient will have no psychosocial issues identified at discharge.     Continue Psychosocial Services   No Follow up required       Psychosocial Re-Evaluation: Psychosocial Re-Evaluation    [place] Name 04/01/18 1601 04/23/18 1512 05/20/18 1458 06/17/18 1559       Psychosocial Re-Evaluation   Current issues with  None Identified  None Identified  None Identified  None Identified    Comments  Patient's initial QOL score as 27.44 and his PHQ-9 score was 7 with no psychosocial issues identified.   Patient's initial QOL score as 27.44 and his PHQ-9 score was 7 with no psychosocial issues identified.   Patient's initial QOL score as 27.44 and his PHQ-9 score was 7 with no psychosocial issues identified.    Patient's initial QOL score as 27.44 and his PHQ-9 score was 7 with no psychosocial issues identified.     Expected Outcomes  Patient will have no psychosocial issues identified at discharge.   Patient will have no psychosocial issues identified at discharge.   Patient will have no psychosocial issues identified at discharge.   Patient will have no psychosocial issues identified at discharge.     Interventions  Relaxation education;Stress management education;Encouraged to attend Pulmonary Rehabilitation for the exercise  Relaxation education;Stress management education;Encouraged to attend Pulmonary Rehabilitation for the exercise  Relaxation education;Stress management education;Encouraged to attend Pulmonary Rehabilitation for the exercise  Relaxation education;Stress management education;Encouraged to attend Pulmonary Rehabilitation for the exercise    Continue Psychosocial Services   No Follow up required  No Follow up required  No Follow up required  No Follow up required       Psychosocial Discharge (Final Psychosocial Re-Evaluation): Psychosocial Re-Evaluation - 06/17/18 1559      Psychosocial Re-Evaluation   Current issues with  None Identified    Comments  Patient's initial QOL score as 27.44 and his PHQ-9 score was 7 with no psychosocial issues identified.     Expected Outcomes  Patient will have no psychosocial issues identified at discharge.     Interventions  Relaxation education;Stress management education;Encouraged to attend Pulmonary Rehabilitation for the exercise    Continue Psychosocial Services   No Follow up required        Education: Education Goals: Education classes will be provided on a weekly basis, covering required topics. Participant will state understanding/return  demonstration of topics presented.  Learning Barriers/Preferences: Learning Barriers/Preferences - 03/03/18 1439      Learning Barriers/Preferences   Learning Barriers  None    Learning  Preferences  Skilled Demonstration;Written Material       Education Topics: How Lungs Work and Diseases: - Discuss the anatomy of the lungs and diseases that can affect the lungs, such as COPD.   PULMONARY REHAB OTHER RESPIRATORY from 06/03/2018 in [place]  Date  03/17/18  Educator  D. Amyria Komar  Instruction Review Code  2- Demonstrated Understanding      Exercise: -Discuss the importance of exercise, FITT principles of exercise, normal and abnormal responses to exercise, and how to exercise safely.   PULMONARY REHAB OTHER RESPIRATORY from 06/03/2018 in [place]  Date  03/11/18  Educator  [place]  Instruction Review Code  2- Demonstrated Understanding      Environmental Irritants: -Discuss types of environmental irritants and how to limit exposure to environmental irritants.   PULMONARY REHAB OTHER RESPIRATORY from 06/03/2018 in [place]  Date  03/25/18  Educator  [place]  Instruction Review Code  2- Demonstrated Understanding      Meds/Inhalers and oxygen: - Discuss respiratory medications, definition of an inhaler and oxygen, and the proper way to use an inhaler and oxygen.   PULMONARY REHAB OTHER RESPIRATORY from 06/03/2018 in [place]  Date  04/01/18  Educator  [place]      Energy Saving Techniques: - Discuss methods to conserve energy and decrease shortness of breath when performing activities of daily living.    Bronchial Hygiene / Breathing Techniques: - Discuss breathing mechanics, pursed-lip breathing technique,  proper posture, effective ways to clear airways, and other functional breathing techniques   PULMONARY REHAB OTHER RESPIRATORY from 06/03/2018 in [place]  Date  04/15/18  Educator  DC  Instruction Review Code  2- Demonstrated Understanding      Cleaning Equipment: - Provides group verbal and written instruction about the health risks of elevated  stress, cause of high stress, and healthy ways to reduce stress.   Nutrition I: Fats: - Discuss the types of cholesterol, what cholesterol does to the body, and how cholesterol levels can be controlled.   Nutrition II: Labels: -Discuss the different components of food labels and how to read food labels.   PULMONARY REHAB OTHER RESPIRATORY from 06/03/2018 in [place]  Date  05/06/18  Educator  D. Cymone Yeske  Instruction Review Code  2- Demonstrated Understanding      Respiratory Infections: - Discuss the signs and symptoms of respiratory infections, ways to prevent respiratory infections, and the importance of seeking medical treatment when having a respiratory infection.   Stress I: Signs and Symptoms: - Discuss the causes of stress, how stress may lead to anxiety and depression, and ways to limit stress.   Stress II: Relaxation: -Discuss relaxation techniques to limit stress.   PULMONARY REHAB OTHER RESPIRATORY from 06/03/2018 in [place]  Date  05/27/18  Educator  [place]  Instruction Review Code  2- Demonstrated Understanding      Oxygen for Home/Travel: - Discuss how to prepare for travel when on oxygen and proper ways to transport and store oxygen to ensure safety.   PULMONARY REHAB OTHER RESPIRATORY from 06/03/2018 in [place]  Date  06/02/18  Educator  Etheleen Mayhew  Instruction Review Code  2- Demonstrated Understanding      Knowledge Questionnaire Score:  Knowledge Questionnaire Score - 03/03/18 1439      Knowledge Questionnaire Score   Pre Score  12/18       Core Components/Risk Factors/Patient Goals at Admission: Personal Goals and Risk Factors at Admission - 03/03/18 1443      Core Components/Risk Factors/Patient Goals on Admission    Weight Management  Yes    Admit Weight  210 lb (95.3 kg)    Goal Weight: Short Term  205 lb (93 kg)    Goal Weight: Long Term  200 lb (90.7 kg)    Expected  Outcomes  Short Term: Continue to assess and modify interventions until short term weight is achieved;Long Term: Adherence to nutrition and physical activity/exercise program aimed toward attainment of established weight goal    Improve shortness of breath with ADL's  Yes    Intervention  Provide education, individualized exercise plan and daily activity instruction to help decrease symptoms of SOB with activities of daily living.    Expected Outcomes  Short Term: Improve cardiorespiratory fitness to achieve a reduction of symptoms when performing ADLs;Long Term: Be able to perform more ADLs without symptoms or delay the onset of symptoms    Diabetes  Yes    Intervention  Provide education about signs/symptoms and action to take for hypo/hyperglycemia.;Provide education about proper nutrition, including hydration, and aerobic/resistive exercise prescription along with prescribed medications to achieve blood glucose in normal ranges: Fasting glucose 65-99 mg/dL    Expected Outcomes  Long Term: Attainment of HbA1C < 7%.    Personal Goal Other  Yes    Personal Goal  Improve DM control; breathe better; increase strength.     Intervention  Patient will attend PR 2 days/week and supplement with exercise 3 days/week.     Expected Outcomes  Patient will meet his personal goals.        Core Components/Risk Factors/Patient Goals Review:  Goals and Risk Factor Review    [place] Name 04/01/18 1557 04/23/18 1459 05/20/18 1453 06/17/18 1556       Core Components/Risk Factors/Patient Goals Review   Personal Goals Review  Weight Management/Obesity;Improve shortness of breath with ADL's;Increase knowledge of respiratory medications and ability to use respiratory devices properly.;Diabetes Breathe better; Do ADL's without SOB; get stronger; lose 10 lbs.   Weight Management/Obesity;Improve shortness of breath with ADL's;Increase knowledge of respiratory medications and ability to use respiratory devices  properly.;Diabetes Breathe better; do ADL's without SOB; get stronger; lsoe 10 lbs.   Weight Management/Obesity;Improve shortness of breath with ADL's;Increase knowledge of respiratory medications and ability to use respiratory devices properly.;Diabetes Breathe better; Do ADL's without SOB; Get stronger; Lose 10 lbs.   Weight Management/Obesity;Improve shortness of breath with ADL's;Increase knowledge of respiratory medications and ability to use respiratory devices properly.;Diabetes Breathe better; Do ADL's without SOB; get stronger; lose 10 lbs.     Review  Patient has completed 8 sessions losing 2.3 lbs since his initial visit. Patient says he can not see any improvement since he started the program. He is being evaluated by a cardiologist and says a stress test has been planned. He is not sure when. He thinks maybe something going on with his heart could be contributing to his SOB. He has no recent Hgb A1C labs showing. He does not take his glucose often, so he does not always report a reading. Will continue to monitor for progress.   Patient has completed 12 sessions maintaining his weight since last 30 day review. Patient continues to say he does not  see any improvement in his breathing or strength contrary to him telling our EP he is getting stronger. He feels like there is more going on than his ILD. He had a heart catheterization 04/20/18 with preliminary results looking like no blockages greater than 40% with finial results with treatment pending. Will continue to monitor for progress.     Patient has completed 15 sessions losing 6 lbs since he started the program. He has missed several sessions since last 30 day review due to cardiovascular workup which was negative. He continues to say that the program has not helped him in his breathing or strength. He is pleased with his weight loss. Will continue to monitor for progress.   Patient has completed 22 sessions losing 2 lbs since he started the program.  He is doing well in the program with progression. He says he feels stronger some days but not other days. He sees a little improvement in his breathing. He has not increased his activity a lot. Will continue to monitor for progress.     Expected Outcomes  Patient will continue to attend sessions and complete the program meeting his personal goals.   Patient will continue to attend sessions and complete the program meeting his personal goals.   Patient will continue to attend sessions and complete the program meeting his personal goals.   Patient will continue to attend sessions and complete the program meeting his personal goals.        Core Components/Risk Factors/Patient Goals at Discharge (Final Review):  Goals and Risk Factor Review - 06/17/18 1556      Core Components/Risk Factors/Patient Goals Review   Personal Goals Review  Weight Management/Obesity;Improve shortness of breath with ADL's;Increase knowledge of respiratory medications and ability to use respiratory devices properly.;Diabetes   Breathe better; Do ADL's without SOB; get stronger; lose 10 lbs.    Review  Patient has completed 22 sessions losing 2 lbs since he started the program. He is doing well in the program with progression. He says he feels stronger some days but not other days. He sees a little improvement in his breathing. He has not increased his activity a lot. Will continue to monitor for progress.     Expected Outcomes  Patient will continue to attend sessions and complete the program meeting his personal goals.        ITP Comments: ITP Comments    Row Name 03/10/18 1457 03/11/18 1616         ITP Comments  Patient new to the program completing 2 sessions. Will continue to monitor for progress.   Patient attend the Family Matters class with hosptial chaplian to discuss how this event has impacted their life.          Comments: ITP REVIEW Pt is making expected progress toward pulmonary rehab goals after  completing 22 sessions. Recommend continued exercise, life style modification, education, and utilization of breathing techniques to increase stamina and strength and decrease shortness of breath with exertion.

## 2018-06-22 ENCOUNTER — Encounter (HOSPITAL_COMMUNITY)
Admission: RE | Admit: 2018-06-22 | Discharge: 2018-06-22 | Disposition: A | Discharge status: Home / Self Care | Payer: Medicare Other | Source: Ambulatory Visit | Attending: Pulmonary Disease | Admitting: Pulmonary Disease

## 2018-06-22 ENCOUNTER — Encounter (HOSPITAL_COMMUNITY): Payer: Medicare Other

## 2018-06-22 DIAGNOSIS — J849 Interstitial pulmonary disease, unspecified: Secondary | ICD-10-CM | POA: Diagnosis not present

## 2018-06-22 NOTE — Progress Notes (Signed)
Daily Session Note  Patient Details  Name: Manuel Atkins MRN: [id number] Date of Birth: 1933/01/07 Referring Provider:     PULMONARY REHAB OTHER RESP ORIENTATION from 03/03/2018 in [place]  Referring Provider  Dr. Vaughan Browner      Encounter Date: 06/22/2018  Check In: Session Check In - 06/22/18 1330      Check-In   Supervising physician immediately available to respond to emergencies  See telemetry face sheet for immediately available MD    Location  [facility]    Staff Present  Russella Dar, MS, EP, [facility], Exercise Physiologist;Other    Medication changes reported      No    Fall or balance concerns reported     Yes    Comments  Patient has a h/o of falls and reports balance issues.     Tobacco Cessation  No Change    Warm-up and Cool-down  Performed as group-led instruction    Resistance Training Performed  Yes    VAD Patient?  No    PAD/SET Patient?  No      Pain Assessment   Currently in Pain?  No/denies    Pain Score  0-No pain    Multiple Pain Sites  No       Capillary Blood Glucose: No results found for this or any previous visit (from the past 24 hour(s)).    Social History   Tobacco Use  Smoking Status Former Smoker  . Years: 20.00  . Types: Pipe, Cigars  . Last attempt to quit: 11/10/1968  . Years since quitting: 49.6  Smokeless Tobacco Never Used    Goals Met:  Proper associated with RPD/PD & O2 Sat Independence with exercise equipment Improved SOB with ADL's Using PLB without cueing & demonstrates good technique Exercise tolerated well No report of cardiac concerns or symptoms Strength training completed today  Goals Unmet:  Not Applicable  Comments: Pt able to follow exercise prescription today without complaint.  Will continue to monitor for progression. Check out: 2:30   Dr. Sinda Du is Medical Director for [facility].

## 2018-06-24 ENCOUNTER — Encounter (HOSPITAL_COMMUNITY)
Admission: RE | Admit: 2018-06-24 | Discharge: 2018-06-24 | Disposition: A | Discharge status: Home / Self Care | Payer: Medicare Other | Source: Ambulatory Visit | Attending: Pulmonary Disease | Admitting: Pulmonary Disease

## 2018-06-24 DIAGNOSIS — J849 Interstitial pulmonary disease, unspecified: Secondary | ICD-10-CM | POA: Diagnosis not present

## 2018-06-24 NOTE — Progress Notes (Signed)
Daily Session Note  Patient Details  Name: SPIROS GREENFELD MRN: [id number] Date of Birth: 10/13/1933 Referring Provider:     PULMONARY REHAB OTHER RESP ORIENTATION from 03/03/2018 in [place]  Referring Provider  Dr. Vaughan Browner      Encounter Date: 06/24/2018  Check In: Session Check In - 06/24/18 1330      Check-In   Supervising physician immediately available to respond to emergencies  See telemetry face sheet for immediately available MD    Location  [facility]    Staff Present  Russella Dar, MS, EP, [facility], Exercise Physiologist;Pegi Milazzo Wynetta Emery, RN, BSN    Medication changes reported      No    Fall or balance concerns reported     Yes    Comments  Patient has a h/o of falls and reports balance issues.     Warm-up and Cool-down  Performed as group-led Higher education careers adviser Performed  Yes    VAD Patient?  No    PAD/SET Patient?  No      Pain Assessment   Currently in Pain?  No/denies    Pain Score  0-No pain    Multiple Pain Sites  No       Capillary Blood Glucose: No results found for this or any previous visit (from the past 24 hour(s)).    Social History   Tobacco Use  Smoking Status Former Smoker  . Years: 20.00  . Types: Pipe, Cigars  . Last attempt to quit: 11/10/1968  . Years since quitting: 49.6  Smokeless Tobacco Never Used    Goals Met:  Proper associated with RPD/PD & O2 Sat Independence with exercise equipment Improved SOB with ADL's Using PLB without cueing & demonstrates good technique Exercise tolerated well No report of cardiac concerns or symptoms Strength training completed today  Goals Unmet:  Not Applicable  Comments: Check out 1430.   Dr. Sinda Du is Medical Director for [facility] Pulmonary Rehab.

## 2018-06-29 ENCOUNTER — Encounter (HOSPITAL_COMMUNITY)
Admission: RE | Admit: 2018-06-29 | Discharge: 2018-06-29 | Disposition: A | Discharge status: Home / Self Care | Payer: Medicare Other | Source: Ambulatory Visit | Attending: Pulmonary Disease | Admitting: Pulmonary Disease

## 2018-06-29 DIAGNOSIS — J849 Interstitial pulmonary disease, unspecified: Secondary | ICD-10-CM

## 2018-06-29 NOTE — Progress Notes (Signed)
Daily Session Note  Patient Details  Name: Manuel Atkins MRN: [id number] Date of Birth: Oct 29, 1933 Referring Provider:     PULMONARY REHAB OTHER RESP ORIENTATION from 03/03/2018 in [place]  Referring Provider  Dr. Vaughan Browner      Encounter Date: 06/29/2018  Check In: Session Check In - 06/29/18 1330      Check-In   Supervising physician immediately available to respond to emergencies  See telemetry face sheet for immediately available MD    Location  [facility]    Staff Present  Russella Dar, MS, EP, [facility], Exercise Physiologist;Other    Medication changes reported      No    Fall or balance concerns reported     Yes    Comments  Patient has a h/o of falls and reports balance issues.     Tobacco Cessation  No Change    Warm-up and Cool-down  Performed as group-led instruction    Resistance Training Performed  Yes    VAD Patient?  No    PAD/SET Patient?  No      Pain Assessment   Currently in Pain?  No/denies    Multiple Pain Sites  No       Capillary Blood Glucose: No results found for this or any previous visit (from the past 24 hour(s)).    Social History   Tobacco Use  Smoking Status Former Smoker  . Years: 20.00  . Types: Pipe, Cigars  . Last attempt to quit: 11/10/1968  . Years since quitting: 49.6  Smokeless Tobacco Never Used    Goals Met:  Proper associated with RPD/PD & O2 Sat Independence with exercise equipment Improved SOB with ADL's Using PLB without cueing & demonstrates good technique Exercise tolerated well Personal goals reviewed No report of cardiac concerns or symptoms Strength training completed today  Goals Unmet:  Not Applicable  Comments: Pt able to follow exercise prescription today without complaint.  Will continue to monitor for progression. Check out: 2:30   Dr. Sinda Du is Medical Director for [facility] Pulmonary Rehab.

## 2018-07-01 ENCOUNTER — Encounter (HOSPITAL_COMMUNITY)
Admission: RE | Admit: 2018-07-01 | Discharge: 2018-07-01 | Disposition: A | Discharge status: Home / Self Care | Payer: Medicare Other | Source: Ambulatory Visit | Attending: Pulmonary Disease | Admitting: Pulmonary Disease

## 2018-07-01 DIAGNOSIS — J849 Interstitial pulmonary disease, unspecified: Secondary | ICD-10-CM

## 2018-07-01 NOTE — Progress Notes (Signed)
Daily Session Note  Patient Details  Name: Manuel Atkins MRN: [id number] Date of Birth: 1932-11-12 Referring Provider:     PULMONARY REHAB OTHER RESP ORIENTATION from 03/03/2018 in [place]  Referring Provider  Dr. Vaughan Browner      Encounter Date: 07/01/2018  Check In: Session Check In - 07/01/18 1330      Check-In   Supervising physician immediately available to respond to emergencies  See telemetry face sheet for immediately available MD    Location  [facility]    Staff Present  Russella Dar, MS, EP, [facility], Exercise Physiologist;Other;Keyshawn Hellwig Wynetta Emery, RN, BSN    Medication changes reported      No    Fall or balance concerns reported     Yes    Comments  Patient has a h/o of falls and reports balance issues.     Warm-up and Cool-down  Performed as group-led Higher education careers adviser Performed  Yes    VAD Patient?  No    PAD/SET Patient?  No      Pain Assessment   Currently in Pain?  No/denies    Pain Score  0-No pain    Multiple Pain Sites  No       Capillary Blood Glucose: No results found for this or any previous visit (from the past 24 hour(s)).    Social History   Tobacco Use  Smoking Status Former Smoker  . Years: 20.00  . Types: Pipe, Cigars  . Last attempt to quit: 11/10/1968  . Years since quitting: 49.6  Smokeless Tobacco Never Used    Goals Met:  Proper associated with RPD/PD & O2 Sat Independence with exercise equipment Improved SOB with ADL's Using PLB without cueing & demonstrates good technique Exercise tolerated well No report of cardiac concerns or symptoms Strength training completed today  Goals Unmet:  Not Applicable  Comments: Pt able to follow exercise prescription today without complaint.  Will continue to monitor for progression. Check out 1430.   Dr. Sinda Du is Medical Director for [facility] Pulmonary Rehab.

## 2018-07-05 DIAGNOSIS — L570 Actinic keratosis: Secondary | ICD-10-CM | POA: Diagnosis not present

## 2018-07-05 DIAGNOSIS — D485 Neoplasm of uncertain behavior of skin: Secondary | ICD-10-CM | POA: Diagnosis not present

## 2018-07-05 DIAGNOSIS — L821 Other seborrheic keratosis: Secondary | ICD-10-CM | POA: Diagnosis not present

## 2018-07-06 ENCOUNTER — Ambulatory Visit (INDEPENDENT_AMBULATORY_CARE_PROVIDER_SITE_OTHER): Payer: Medicare Other | Admitting: Internal Medicine

## 2018-07-06 ENCOUNTER — Encounter (INDEPENDENT_AMBULATORY_CARE_PROVIDER_SITE_OTHER): Payer: Self-pay | Admitting: Internal Medicine

## 2018-07-06 ENCOUNTER — Encounter (HOSPITAL_COMMUNITY): Payer: Medicare Other

## 2018-07-06 VITALS — BP 110/76 | HR 70 | Temp 97.4°F | Resp 20 | Ht 72.0 in | Wt 207.4 lb

## 2018-07-06 DIAGNOSIS — I2510 Atherosclerotic heart disease of native coronary artery without angina pectoris: Secondary | ICD-10-CM

## 2018-07-06 DIAGNOSIS — F419 Anxiety disorder, unspecified: Secondary | ICD-10-CM

## 2018-07-06 DIAGNOSIS — K2100 Gastro-esophageal reflux disease with esophagitis, without bleeding: Secondary | ICD-10-CM

## 2018-07-06 DIAGNOSIS — K5100 Ulcerative (chronic) pancolitis without complications: Secondary | ICD-10-CM | POA: Diagnosis not present

## 2018-07-06 MED ORDER — ALPRAZOLAM 0.25 MG PO TABS: 0.1250 mg | ORAL_TABLET | Freq: Three times a day (TID) | ORAL | 1 refills | Status: DC | PRN

## 2018-07-06 MED ORDER — PANTOPRAZOLE SODIUM 40 MG PO TBEC: 40.0000 mg | DELAYED_RELEASE_TABLET | ORAL | 3 refills | Status: DC

## 2018-07-06 NOTE — Progress Notes (Signed)
Presenting complaint;  Follow-up for ulcerative colitis and chronic GERD.  Database and subjective:  Patient is 84 year old Caucasian male with several year history of ulcerative colitis who is currently maintained on infliximab and oral mesalamine who is here for scheduled visit.  He was last seen 6 months ago.  He underwent EGD with esophageal dilation prior to his last visit.  He is accompanied by his wife.  He states he is doing well from GI standpoint.  He has 1 formed stool daily.  He does not even remember the last time he passed blood per rectum.  He denies abdominal pain nausea or vomiting.  Similarly he rarely has heartburn.  He has had no difficulty swallowing food since his esophageal dilation 6 months ago.  He does have difficulty with swallowing big pills such as Metformin.  He does not have any side effects with infliximab except he feels tired for a day or 2 after each infusion. He feels his lung disease is getting worse.  He says today is not a good day because of wet weather.  He reports dyspnea with minimal exertion.  He denies productive cough fever or chills. He saw his pulmonologist Dr. Vaughan Browner last month and has an appointment a few months.  Current Medications: Outpatient Encounter Medications as of 07/06/2018  Medication Sig  . acetaminophen (TYLENOL) 500 MG tablet Take 1,000 mg by mouth every 6 (six) hours as needed for moderate pain or headache.  . ALPRAZolam (XANAX) 0.25 MG tablet Take 1 tablet (0.25 mg total) by mouth 3 (three) times daily as needed. for anxiety (Patient taking differently: Take 0.125 mg by mouth 3 (three) times daily as needed for anxiety. )  . amLODipine (NORVASC) 2.5 MG tablet Take 2.5 mg by mouth daily.  Marland Kitchen aspirin EC 81 MG tablet Take 81 mg by mouth daily.  Marland Kitchen imipramine (TOFRANIL) 25 MG tablet Take 75 mg by mouth at bedtime  . latanoprost (XALATAN) 0.005 % ophthalmic solution Place 1 drop into both eyes at bedtime.  Marland Kitchen LIALDA 1.2 g EC tablet TAKE  TWO (2) TABLETS BY MOUTH TWICE DAILY  . metFORMIN (GLUCOPHAGE-XR) 500 MG 24 hr tablet Take 1,000 mg by mouth at bedtime.  . nitroGLYCERIN (NITROSTAT) 0.4 MG SL tablet Place 0.4 mg under the tongue every 5 (five) minutes as needed for chest pain.   . pantoprazole (PROTONIX) 40 MG tablet TAKE ONE TABLET BY MOUTH EVERY *OTHER* DAY  . Probiotic Product (PROBIOTIC DAILY PO) Take 1 capsule by mouth daily.   . [DISCONTINUED] rosuvastatin (CRESTOR) 20 MG tablet Take 20 mg by mouth at bedtime.  . [DISCONTINUED] tamsulosin (FLOMAX) 0.4 MG CAPS capsule Take 0.4 mg by mouth at bedtime.   No facility-administered encounter medications on file as of 07/06/2018.      Objective: Blood pressure 110/76, pulse 70, temperature (!) 97.4 F (36.3 C), temperature source Oral, resp. rate 20, height 6' (1.829 m), weight 207 lb 6.4 oz (94.1 kg). Patient is alert and appears to be comfortable sitting in chair. He reported dyspnea on moving from chair to examination table. Conjunctiva is pink. Sclera is nonicteric Oropharyngeal mucosa is normal. No neck masses or thyromegaly noted. Cardiac exam with regular rhythm normal S1 and S2. No murmur or gallop noted. Auscultation lungs reveal diminished intensity of breath sounds bilaterally.  No rales or rhonchi noted.. Abdomen full but soft and nontender with organomegaly or masses. No LE edema or clubbing noted.  Labs/studies Results:  CBC from 03/05/2018 WBC 6.5 H&H 15.3 and  43.8 Platelet count 178K.  Assessment:  #1.  Chronic ulcerative colitis.  He is presently on infliximab and oral mesalamine.  He has been in remission for over 5 years.  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    #2.  Interstitial lung disease.  He continues to have breathing issues.  He is felt to have interstitial lung disease and being closely followed by Dr. Vaughan Browner.  Symptomatically he is getting worse.  There is a  remote possibility that his lung disease could be caused by oral mesalamine.  I feel it would be reasonable to stop oral mesalamine and continue infliximab.  #3.  Chronic GERD.  His disease been complicated by esophageal stricture which was last dilated 2 months ago.  He has no difficulty swallowing foods.  Heartburn is well controlled with every other day PPI.  #4.  Chronic anxiety.  He needs new prescription for alprazolam.  Plan:  Discontinue Lialda. New prescription given for alprazolam for 3 months with 1 refill. Patient advised to call office if he has abdominal pain diarrhea or rectal bleeding. Office visit in 6 months.

## 2018-07-06 NOTE — Patient Instructions (Signed)
Discontinue Lialda.  Notify if you have diarrhea or abdominal pain

## 2018-07-08 ENCOUNTER — Encounter (HOSPITAL_COMMUNITY)
Admission: RE | Admit: 2018-07-08 | Discharge: 2018-07-08 | Disposition: A | Discharge status: Home / Self Care | Payer: Medicare Other | Source: Ambulatory Visit | Attending: Pulmonary Disease | Admitting: Pulmonary Disease

## 2018-07-08 DIAGNOSIS — J849 Interstitial pulmonary disease, unspecified: Secondary | ICD-10-CM | POA: Diagnosis not present

## 2018-07-08 NOTE — Progress Notes (Signed)
Pulmonary Individual Treatment Plan  Patient Details  Name: Manuel Atkins MRN: [id number] Date of Birth: [birth year] Referring Provider:     PULMONARY REHAB OTHER RESP ORIENTATION from 03/03/2018 in [place]  Referring Provider  Dr. Vaughan Browner      Initial Encounter Date:    [place] from 03/03/2018 in [place]  Date  03/03/18      Visit Diagnosis: ILD (interstitial lung disease) ([place])  Patient's Home Medications on Admission:   Current Outpatient Medications:  .  acetaminophen (TYLENOL) 500 MG tablet, Take 1,000 mg by mouth every 6 (six) hours as needed for moderate pain or headache., Disp: , Rfl:  .  ALPRAZolam (XANAX) 0.25 MG tablet, Take 0.5 tablets (0.125 mg total) by mouth 3 (three) times daily as needed for anxiety. for anxiety, Disp: 135 tablet, Rfl: 1 .  amLODipine (NORVASC) 2.5 MG tablet, Take 2.5 mg by mouth daily., Disp: , Rfl:  .  aspirin EC 81 MG tablet, Take 81 mg by mouth daily., Disp: , Rfl:  .  imipramine (TOFRANIL) 25 MG tablet, Take 75 mg by mouth at bedtime, Disp: , Rfl: 0 .  latanoprost (XALATAN) 0.005 % ophthalmic solution, Place 1 drop into both eyes at bedtime., Disp: , Rfl: 6 .  metFORMIN (GLUCOPHAGE-XR) 500 MG 24 hr tablet, Take 1,000 mg by mouth at bedtime., Disp: , Rfl: 1 .  nitroGLYCERIN (NITROSTAT) 0.4 MG SL tablet, Place 0.4 mg under the tongue every 5 (five) minutes as needed for chest pain. , Disp: , Rfl: 1 .  pantoprazole (PROTONIX) 40 MG tablet, Take 1 tablet (40 mg total) by mouth every other day., Disp: 45 tablet, Rfl: 3 .  Probiotic Product (PROBIOTIC DAILY PO), Take 1 capsule by mouth daily. , Disp: , Rfl:   Past Medical History: Past Medical History:  Diagnosis Date  . 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 Dyspnea   . Essential hypertension, benign   . GERD (gastroesophageal reflux disease)   . Headache(784.0)   . History of colon polyps   . History of transfusion of  whole blood   . Interstitial lung disease ([place]) 12/01/2017  . Mixed hyperlipidemia   . Obstructive sleep apnea (adult) (pediatric)    uses bipap @ HS  . Other malaise and fatigue   . Thrombocytopenia ([place]) 11/10/2012  . Type II or unspecified type diabetes mellitus without mention of complication, uncontrolled   . 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 Years: 20.00  . Types: Pipe, Cigars  . Last attempt to quit: 11/10/1968  . Years since quitting: 49.6  Smokeless Tobacco Never Used    Labs: Recent Chemical engineer    Labs for ITP Cardiac and Pulmonary Rehab Latest Ref Rng & Units 11/07/2012 04/20/2018 04/20/2018   Hemoglobin A1c <5.7 % 6.2(H) - -   PHART 7.350 - 7.450 - - 7.304(L)   PCO2ART 32.0 - 48.0 mmHg - - 42.2   HCO3 20.0 - 28.0 mmol/L - 22.3 21.0   TCO2 22 - 32 mmol/L - 24 22   ACIDBASEDEF 0.0 - 2.0 mmol/L - 4.0(H) 5.0(H)   O2SAT % - 63.0 93.0      Capillary Blood Glucose: Lab Results  Component Value Date   GLUCAP 140 (H) 04/20/2018   GLUCAP 168 (H) 04/20/2018   GLUCAP 206 (H) 04/20/2018   GLUCAP 139 (H) 12/15/2017   GLUCAP  133 (H) 06/16/2013     Pulmonary Assessment Scores: Pulmonary Assessment Scores    Row Name 03/03/18 1440         ADL UCSD   ADL Phase  Entry     SOB Score total  69     Rest  0     Walk  6     Stairs  4     Bath  4     Dress  4     Shop  3       CAT Score   CAT Score  18       mMRC Score   mMRC Score  3        Pulmonary Function Assessment: Pulmonary Function Assessment - 03/03/18 1452      Pulmonary Function Tests   FVC%  58 %    FEV1%  67 %    FEV1/FVC Ratio  82    DLCO%  14.81 %      Post Bronchodilator Spirometry Results   FVC%  54 %    FEV1%  66 %    FEV1/FVC Ratio  86       Exercise Target Goals: Exercise Program Goal: Individual exercise prescription set using results from initial 6 min walk test and THRR while  considering  patient's activity barriers and safety.   Exercise Prescription Goal: Initial exercise prescription builds to 30-45 minutes a day of aerobic activity, 2-3 days per week.  Home exercise guidelines will be given to patient during program as part of exercise prescription that the participant will acknowledge.  Activity Barriers & Risk Stratification: Activity Barriers & Cardiac Risk Stratification - 03/03/18 1405      Activity Barriers & Cardiac Risk Stratification   Activity Barriers  Shortness of Breath    Cardiac Risk Stratification  High       6 Minute Walk: 6 Minute Walk    Row Name 03/03/18 1404         6 Minute Walk   Phase  Initial     Distance  1100 feet     Distance % Change  0 %     Distance Feet Change  0 ft     Walk Time  6 minutes     # of Rest Breaks  0     MPH  2.08     METS  2.59     RPE  12     Perceived Dyspnea   14     VO2 Peak  7.16     Symptoms  No     Resting HR  77 bpm     Resting BP  154/74     Resting Oxygen Saturation   98 %     Exercise Oxygen Saturation  during 6 min walk  90 %     Max Ex. HR  79 bpm     Max Ex. BP  160/74     2 Minute Post BP  144/74        Oxygen Initial Assessment: Oxygen Initial Assessment - 03/03/18 1402      Home Oxygen   Home Oxygen Device  None    Sleep Oxygen Prescription  BiPAP    Home Exercise Oxygen Prescription  None    Home at Rest Exercise Oxygen Prescription  None      Initial 6 min Walk   Oxygen Used  None      Program Oxygen Prescription   Program Oxygen Prescription  None      Intervention   Short Term Goals  To learn and understand importance of monitoring SPO2 with pulse oximeter and demonstrate accurate use of the pulse oximeter.;To learn and understand importance of maintaining oxygen saturations>88%;To learn and demonstrate proper pursed lip breathing techniques or other breathing techniques.    Long  Term Goals  Verbalizes importance of monitoring SPO2 with pulse oximeter and  return demonstration;Maintenance of O2 saturations>88%;Exhibits proper breathing techniques, such as pursed lip breathing or other method taught during program session       Oxygen Re-Evaluation: Oxygen Re-Evaluation    Row Name 04/01/18 1553 04/23/18 1457 05/20/18 1453 06/17/18 1553 07/08/18 1536     Program Oxygen Prescription   Program Oxygen Prescription  None  None  None  None  None     Home Oxygen   Home Oxygen Device  None  None  None  None  None   Sleep Oxygen Prescription  BiPAP  BiPAP  BiPAP  BiPAP  BiPAP   Liters per minute  0  0  0  0  0   Home Exercise Oxygen Prescription  None  None  None  None  None   Home at Rest Exercise Oxygen Prescription  None  None  None  None  None   Compliance with Home Oxygen Use  -  -  -  Yes  Yes     Goals/Expected Outcomes   Short Term Goals  To learn and understand importance of monitoring SPO2 with pulse oximeter and demonstrate accurate use of the pulse oximeter.;To learn and understand importance of maintaining oxygen saturations>88%;To learn and demonstrate proper pursed lip breathing techniques or other breathing techniques.  To learn and understand importance of monitoring SPO2 with pulse oximeter and demonstrate accurate use of the pulse oximeter.;To learn and understand importance of maintaining oxygen saturations>88%;To learn and demonstrate proper pursed lip breathing techniques or other breathing techniques.  To learn and understand importance of monitoring SPO2 with pulse oximeter and demonstrate accurate use of the pulse oximeter.;To learn and understand importance of maintaining oxygen saturations>88%;To learn and demonstrate proper pursed lip breathing techniques or other breathing techniques.  To learn and understand importance of monitoring SPO2 with pulse oximeter and demonstrate accurate use of the pulse oximeter.;To learn and understand importance of maintaining oxygen saturations>88%;To learn and demonstrate proper pursed lip  breathing techniques or other breathing techniques.  To learn and understand importance of monitoring SPO2 with pulse oximeter and demonstrate accurate use of the pulse oximeter.;To learn and understand importance of maintaining oxygen saturations>88%;To learn and demonstrate proper pursed lip breathing techniques or other breathing techniques.   Long  Term Goals  Verbalizes importance of monitoring SPO2 with pulse oximeter and return demonstration;Maintenance of O2 saturations>88%;Exhibits proper breathing techniques, such as pursed lip breathing or other method taught during program session  Verbalizes importance of monitoring SPO2 with pulse oximeter and return demonstration;Maintenance of O2 saturations>88%;Exhibits proper breathing techniques, such as pursed lip breathing or other method taught during program session  Verbalizes importance of monitoring SPO2 with pulse oximeter and return demonstration;Maintenance of O2 saturations>88%;Exhibits proper breathing techniques, such as pursed lip breathing or other method taught during program session  Verbalizes importance of monitoring SPO2 with pulse oximeter and return demonstration;Maintenance of O2 saturations>88%;Exhibits proper breathing techniques, such as pursed lip breathing or other method taught during program session  Verbalizes importance of monitoring SPO2 with pulse oximeter and return demonstration;Maintenance of O2 saturations>88%;Exhibits proper breathing techniques, such as pursed lip breathing  or other method taught during program session   Comments  Patient demonstrates proper pursed lip breathing techniques during exercise and also demonstrates proper usage of pulse oximeter. He is able to verbalize the importance of maintaining his O2 saturations >88%. Will continue to monitor.   Patient demonstrates proper pursed lip breathing techniques during exercise and also demonstrates proper usage of pulse oximeter. He is able to verbalize the  importance of maintaining his O2 saturations >88%. Will continue to monitor.   Patient demonstrates proper pursed lip breathing techniques during exercise and also demonstrates proper usage of pulse oximeter. He is able to verbalize the importance of maintaining his O2 saturations >88%. Will continue to monitor.   Patient demonstrates proper pursed lip breathing techniques during exercise and also demonstrates proper usage of pulse oximeter. He is able to verbalize the importance of maintaining his O2 saturations >88%. Will continue to monitor.   Patient demonstrates proper pursed lip breathing techniques during exercise and also demonstrates proper usage of pulse oximeter. He is able to verbalize the importance of maintaining his O2 saturations >88%. Will continue to monitor.    Goals/Expected Outcomes  Patient will continue to meet his short and long term goals.   Patient will continue to meet his short and long term goals.   Patient will continue to meet his short and long term goals.   Patient will continue to meet his short and long term goals.   Patient will continue to meet his short and long term goals.       Oxygen Discharge (Final Oxygen Re-Evaluation): Oxygen Re-Evaluation - 07/08/18 1536      Program Oxygen Prescription   Program Oxygen Prescription  None      Home Oxygen   Home Oxygen Device  None    Sleep Oxygen Prescription  BiPAP    Liters per minute  0    Home Exercise Oxygen Prescription  None    Home at Rest Exercise Oxygen Prescription  None    Compliance with Home Oxygen Use  Yes      Goals/Expected Outcomes   Short Term Goals  To learn and understand importance of monitoring SPO2 with pulse oximeter and demonstrate accurate use of the pulse oximeter.;To learn and understand importance of maintaining oxygen saturations>88%;To learn and demonstrate proper pursed lip breathing techniques or other breathing techniques.    Long  Term Goals  Verbalizes importance of monitoring  SPO2 with pulse oximeter and return demonstration;Maintenance of O2 saturations>88%;Exhibits proper breathing techniques, such as pursed lip breathing or other method taught during program session    Comments  Patient demonstrates proper pursed lip breathing techniques during exercise and also demonstrates proper usage of pulse oximeter. He is able to verbalize the importance of maintaining his O2 saturations >88%. Will continue to monitor.     Goals/Expected Outcomes  Patient will continue to meet his short and long term goals.        Initial Exercise Prescription: Initial Exercise Prescription - 03/03/18 1400      Date of Initial Exercise RX and Referring Provider   Date  03/03/18    Referring Provider  Dr. Vaughan Browner      Treadmill   MPH  1.3    Grade  0    Minutes  15    METs  1.9      NuStep   Level  1    SPM  74    Minutes  20    METs  1.9  Prescription Details   Frequency (times per week)  3    Duration  Progress to 30 minutes of continuous aerobic without signs/symptoms of physical distress      Intensity   THRR 40-80% of Max Heartrate  101-113-126    Ratings of Perceived Exertion  11-13    Perceived Dyspnea  0-4      Progression   Progression  Continue progressive overload as per policy without signs/symptoms or physical distress.      Resistance Training   Training Prescription  Yes    Weight  1    Reps  10-15       Perform Capillary Blood Glucose checks as needed.  Exercise Prescription Changes:  Exercise Prescription Changes    Row Name 03/11/18 1400 03/16/18 1000 03/30/18 0700 04/12/18 1400 04/28/18 1400     Response to Exercise   Blood Pressure (Admit)  -  138/68  130/70  130/60  120/68   Blood Pressure (Exercise)  -  160/70  150/74  150/68  150/62   Blood Pressure (Exit)  -  144/70  134/68  128/66  110/64   Heart Rate (Admit)  -  91 bpm  84 bpm  91 bpm  87 bpm   Heart Rate (Exercise)  -  73 bpm  99 bpm  91 bpm  80 bpm   Heart Rate (Exit)  -  82  bpm  89 bpm  91 bpm  95 bpm   Oxygen Saturation (Admit)  -  97 %  93 %  91 %  96 %   Oxygen Saturation (Exercise)  -  98 %  94 %  90 %  92 %   Oxygen Saturation (Exit)  -  91 %  90 %  97 %  93 %   Rating of Perceived Exertion (Exercise)  -  12  12  11  12    Perceived Dyspnea (Exercise)  -  12  12  11  13    Duration  -  Progress to 30 minutes of  aerobic without signs/symptoms of physical distress  Progress to 30 minutes of  aerobic without signs/symptoms of physical distress  Progress to 30 minutes of  aerobic without signs/symptoms of physical distress  Progress to 30 minutes of  aerobic without signs/symptoms of physical distress   Intensity  -  THRR New 110-119-129  THRR New 106-116-127  THRR New 110-119-129  THRR New 107-118-128     Progression   Progression  -  Continue to progress workloads to maintain intensity without signs/symptoms of physical distress.  Continue to progress workloads to maintain intensity without signs/symptoms of physical distress.  Continue to progress workloads to maintain intensity without signs/symptoms of physical distress.  Continue to progress workloads to maintain intensity without signs/symptoms of physical distress.     Resistance Training   Training Prescription  Yes  Yes  Yes  Yes  Yes   Weight  1  1  2  2  3    Reps  10-15  10-15  10-15  10-15  10-15     Treadmill   MPH  1.3  1.3  1.5  1.7  1.7   Grade  0  0  0  0  0   Minutes  15  15  15  15  15    METs  1.9  1.9  2.15  2.3  2.3     NuStep   Level  1  1  1  2   2  SPM  74  95  92  125  115   Minutes  20  20  20  20  20    METs  1.9  1.9  1.8  1.8  1.9     Home Exercise Plan   Plans to continue exercise at  Home (comment)  Home (comment)  Home (comment)  Home (comment)  Home (comment)   Frequency  Add 2 additional days to program exercise sessions.  Add 2 additional days to program exercise sessions.  Add 2 additional days to program exercise sessions.  Add 2 additional days to program exercise  sessions.  Add 2 additional days to program exercise sessions.   Initial Home Exercises Provided  03/11/18  03/11/18  03/11/18  03/11/18  03/11/18   Row Name 05/11/18 0800 05/30/18 1600 06/19/18 1200 07/07/18 1900       Response to Exercise   Blood Pressure (Admit)  128/66  120/64  138/70  142/72    Blood Pressure (Exercise)  146/68  170/78  180/70  162/68    Blood Pressure (Exit)  128/66  130/66  140/70  114/70    Heart Rate (Admit)  102 bpm  99 bpm  86 bpm  94 bpm    Heart Rate (Exercise)  88 bpm  101 bpm  106 bpm  102 bpm    Heart Rate (Exit)  97 bpm  98 bpm  87 bpm  98 bpm    Oxygen Saturation (Admit)  96 %  99 %  94 %  98 %    Oxygen Saturation (Exercise)  90 %  99 %  90 %  96 %    Oxygen Saturation (Exit)  90 %  97 %  94 %  98 %    Rating of Perceived Exertion (Exercise)  12  11  12  11     Perceived Dyspnea (Exercise)  13  11  11  12     Duration  Progress to 30 minutes of  aerobic without signs/symptoms of physical distress  Progress to 30 minutes of  aerobic without signs/symptoms of physical distress  Progress to 30 minutes of  aerobic without signs/symptoms of physical distress  Progress to 30 minutes of  aerobic without signs/symptoms of physical distress    Intensity  THRR New 116-124-131  THRR New 114-122-130  THRR New 106-117-127  THRR unchanged      Progression   Progression  Continue to progress workloads to maintain intensity without signs/symptoms of physical distress.  Continue to progress workloads to maintain intensity without signs/symptoms of physical distress.  Continue to progress workloads to maintain intensity without signs/symptoms of physical distress.  Continue to progress workloads to maintain intensity without signs/symptoms of physical distress.    Average METs  -  2.1  2.16  3.12      Resistance Training   Training Prescription  Yes  Yes  Yes  Yes    Weight  3  3  4  5     Reps  10-15  10-15  10-15  10-15    Time  -  5 Minutes  5 Minutes  5 Minutes       Treadmill   MPH  1.7  1.7  2  2.1    Grade  0  0  0  0    Minutes  15  15  17  17     METs  2.3  2.3  2.53  2.6      NuStep  Level  2  2  3  3     SPM  97  118  123  128    Minutes  20  20  22  22     METs  2  1.9  1.8  1.9      Home Exercise Plan   Plans to continue exercise at  Home (comment)  Home (comment)  Home (comment)  Home (comment)    Frequency  Add 2 additional days to program exercise sessions.  Add 2 additional days to program exercise sessions.  Add 2 additional days to program exercise sessions.  Add 2 additional days to program exercise sessions.    Initial Home Exercises Provided  03/11/18  03/11/18  03/11/18  03/11/18       Exercise Comments:  Exercise Comments    Row Name 03/11/18 1448 03/16/18 1027 03/30/18 0748 04/12/18 1449 04/28/18 1416   Exercise Comments  Patient recieved the take home exercise plan today 03/11/2018. THR was addressed as were safety guidelines for being active when not here in PR. Patient demonstrated an understanding and was encouraged to ask any future questions as they arise.   Patient has been doing well in PR. Patient has just started the program and is handling the initial exercise progression well. Patient will be progressed in time.   Patient is doing well in PR. Patient has increased his speed on the treadmill and his weights for the warm up portion of the session have increased to 2lbs. Patient has stated to me that he feels some more strength. Patient has still only been for PR for 6 sessions. Patient will be progressed more over time to meet his goals.    Patient is doing well in PR. Patient has increased his level on the nustep machine and increased his speed on the treadmill to 1.7. Patient states that he feels more energy to do tasks around the house.   Patient has been doing well in PR, increasing his weight on the warm up portion of the sessions to 3lbs and maintaining his levels on the machines. Patient has not attended class since  04/15/2018 so no more progression has yet been made.    [place] Name 05/11/18 0818 06/19/18 1258 07/07/18 1935       Exercise Comments  Patient is doing well in PR. 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 Patient will be progressed in time.   Patient is progressing at a steady rate. He is breathing better and increasing in weights and equipment workloads.   Patient is progressing at a steady rate. He is breathing better and increasing in weights and equipment workloads.         Exercise Goals and Review:  Exercise Goals    Row Name 03/03/18 1406             Exercise Goals   Increase Physical Activity  Yes       Intervention  Provide advice, education, support and counseling about physical activity/exercise needs.;Develop an individualized exercise prescription for aerobic and resistive training based on initial evaluation findings, risk stratification, comorbidities and participant's personal goals.       Expected Outcomes  Short Term: Attend rehab on a regular basis to increase amount of physical activity.       Increase Strength and Stamina  Yes       Intervention  Provide advice, education, support and counseling about physical activity/exercise needs.;Develop an individualized exercise prescription  for aerobic and resistive training based on initial evaluation findings, risk stratification, comorbidities and participant's personal goals.       Expected Outcomes  Short Term: Increase workloads from initial exercise prescription for resistance, speed, and METs.       Able to understand and use rate of perceived exertion (RPE) scale  Yes       Intervention  Provide education and explanation on how to use RPE scale       Expected Outcomes  Short Term: Able to use RPE daily in rehab to express subjective intensity level;Long Term:  Able to use RPE to guide intensity level when exercising independently       Able to understand and use Dyspnea scale   Yes       Intervention  Provide education and explanation on how to use Dyspnea scale       Expected Outcomes  Short Term: Able to use Dyspnea scale daily in rehab to express subjective sense of shortness of breath during exertion;Long Term: Able to use Dyspnea scale to guide intensity level when exercising independently       Knowledge and understanding of Target Heart Rate Range (THRR)  Yes       Intervention  Provide education and explanation of THRR including how the numbers were predicted and where they are located for reference       Expected Outcomes  Short Term: Able to state/look up THRR;Long Term: Able to use THRR to govern intensity when exercising independently;Short Term: Able to use daily as guideline for intensity in rehab       Able to check pulse independently  Yes       Intervention  Provide education and demonstration on how to check pulse in carotid and radial arteries.;Review the importance of being able to check your own pulse for safety during independent exercise       Expected Outcomes  Short Term: Able to explain why pulse checking is important during independent exercise;Long Term: Able to check pulse independently and accurately       Understanding of Exercise Prescription  Yes       Intervention  Provide education, explanation, and written materials on patient's individual exercise prescription       Expected Outcomes  Short Term: Able to explain program exercise prescription;Long Term: Able to explain home exercise prescription to exercise independently          Exercise Goals Re-Evaluation : Exercise Goals Re-Evaluation    Row Name 03/30/18 0746 04/20/18 0831 05/17/18 1511 06/19/18 1248 07/07/18 1932     Exercise Goal Re-Evaluation   Exercise Goals Review  Increase Physical Activity;Increase Strength and Stamina;Able to check pulse independently;Able to understand and use rate of perceived exertion (RPE) scale;Knowledge and understanding of Target Heart Rate Range  (THRR);Able to understand and use Dyspnea scale;Understanding of Exercise Prescription  Increase Physical Activity;Increase Strength and Stamina;Able to check pulse independently;Able to understand and use rate of perceived exertion (RPE) scale;Knowledge and understanding of Target Heart Rate Range (THRR);Able to understand and use Dyspnea scale;Understanding of Exercise Prescription  Increase Physical Activity;Increase Strength and Stamina;Able to check pulse independently;Able to understand and use rate of perceived exertion (RPE) scale;Knowledge and understanding of Target Heart Rate Range (THRR);Able to understand and use Dyspnea scale;Understanding of Exercise Prescription  Increase Strength and Stamina;Able to understand and use rate of perceived exertion (RPE) scale;Able to understand and use Dyspnea scale;Knowledge and understanding of Target Heart Rate Range (THRR);Understanding of Exercise Prescription  Increase  Physical Activity;Increase Strength and Stamina;Able to understand and use rate of perceived exertion (RPE) scale;Able to understand and use Dyspnea scale;Understanding of Exercise Prescription   Comments  Patient is doing well in PR. Patient has increased his speed on the treadmill and his weights for the warm up portion of the session have increased to 2lbs. Patient has stated to me that he feels some more strength. Patient has still only been for PR for 6 sessions. Patient will be progressed more over time to meet his goals.    Patient has been doing very well in PR. Patient has stated to me that he feels much more energy and strength from the program. Patient has become less SOB he states. Patient has been doing well on his nustep and has been progressed to level 2 on the machine.   Patient has been doing very well in CR. Patient has just recently returned from getting several tests done that have hindered his attendance. Patient has stated to me that he has much better strength since  beginning the program however he is still working on being able to breathe better and working on breathing techniques.   Patient has been able to handle increases in weights and equipment workloads. His blood sugar readings fasting is still high. Patient feels he is breathing better. He is not sure this is making him stronger. We reassure him that he is improving based on his being able to handle increases in his workloads. Patient has lost 4lbs from start of program. Has not lost any more and is not gaining any weight either. Weight staying between 206-208lbs.   Patient has been able to handle increases in weights and equipment workloads. His blood sugar readings fasting is still high. Patient feels he is breathing better. He is not sure this is making him stronger. We reassure him that he is improving based on his being able to handle increases in his workloads. Patient has lost 4lbs from start of program. Has not lost any more and is not gaining any weight either. Weight holding his weight at 210lbs.    Expected Outcomes  Patient wishes to lose weight, breathe better, and to gain strength.   Patient wishes to lose weight, breathe better, and to gain strength.   Patient wishes to lose weight, breathe better, and to gain strength.   More DM control, breath better, lose weight and improve strength  More DM control, breath better, lose weight and improve strength      Discharge Exercise Prescription (Final Exercise Prescription Changes): Exercise Prescription Changes - 07/07/18 1900      Response to Exercise   Blood Pressure (Admit)  142/72    Blood Pressure (Exercise)  162/68    Blood Pressure (Exit)  114/70    Heart Rate (Admit)  94 bpm    Heart Rate (Exercise)  102 bpm    Heart Rate (Exit)  98 bpm    Oxygen Saturation (Admit)  98 %    Oxygen Saturation (Exercise)  96 %    Oxygen Saturation (Exit)  98 %    Rating of Perceived Exertion (Exercise)  11    Perceived Dyspnea (Exercise)  12     Duration  Progress to 30 minutes of  aerobic without signs/symptoms of physical distress    Intensity  THRR unchanged      Progression   Progression  Continue to progress workloads to maintain intensity without signs/symptoms of physical distress.    Average METs  3.12  Resistance Training   Training Prescription  Yes    Weight  5    Reps  10-15    Time  5 Minutes      Treadmill   MPH  2.1    Grade  0    Minutes  17    METs  2.6      NuStep   Level  3    SPM  128    Minutes  22    METs  1.9      Home Exercise Plan   Plans to continue exercise at  Home (comment)    Frequency  Add 2 additional days to program exercise sessions.    Initial Home Exercises Provided  03/11/18       Nutrition:  Target Goals: Understanding of nutrition guidelines, daily intake of sodium <1534m, cholesterol <2033m calories 30% from fat and 7% or less from saturated fats, daily to have 5 or more servings of fruits and vegetables.  Biometrics: Pre Biometrics - 03/03/18 1406      Pre Biometrics   Height  5' 11"  (1.803 m)    Weight  95.3 kg    Waist Circumference  42 inches    Hip Circumference  43 inches    Waist to Hip Ratio  0.98 %    BMI (Calculated)  29.3    Triceps Skinfold  9 mm    % Body Fat  27.2 %    Grip Strength  62.29 kg    Flexibility  0 in    Single Leg Stand  2 seconds        Nutrition Therapy Plan and Nutrition Goals: Nutrition Therapy & Goals - 03/11/18 1616      Personal Nutrition Goals   Nutrition Goal  For heart healthy choices add >50% of whole grains, make half their plate fruits and vegetables. Discuss the difference between starchy vegetables and leafy greens, and how leafy vegetables provide fiber, helps maintain healthy weight, helps control blood glucose, and lowers cholesterol.  Discuss purchasing fresh or frozen vegetable to reduce sodium and not to add grease, fat or sugar. Consume <18oz of red meat per week. Consume lean cuts of meats and very little  of meats high in sodium and nitrates such as pork and lunch meats. Discussed portion control for all food groups.     Additional Goals?  No      Intervention Plan   Intervention  Nutrition handout(s) given to patient.    Expected Outcomes  Short Term Goal: Understand basic principles of dietary content, such as calories, fat, sodium, cholesterol and nutrients.       Nutrition Assessments: Nutrition Assessments - 03/03/18 1443      MEDFICTS Scores   Pre Score  36       Nutrition Goals Re-Evaluation: Nutrition Goals Re-Evaluation    [place] 04/01/18 1555 04/23/18 1458 06/17/18 1553 07/08/18 1537       Goals   Current Weight  207 lb 11.2 oz (94.2 kg)  207 lb 12.8 oz (94.3 kg)  208 lb (94.3 kg)  210 lb (95.3 kg)    Nutrition Goal  For heart healthy choices add >50% of whole grains, make half their plate fruits and vegetables. Discuss the difference between starchy vegetables and leafy greens, and how leafy vegetables provide fiber, helps maintain healthy weight, helps control blood glucose, and lowers cholesterol.  Discuss purchasing fresh or frozen vegetable to reduce sodium and not to add grease, fat or sugar. Consume <  18oz of red meat per week. Consume lean cuts of meats and very little of meats high in sodium and nitrates such as pork and lunch meats. Discussed portion control for all food groups.   For heart healthy choices add >50% of whole grains, make half their plate fruits and vegetables. Discuss the difference between starchy vegetables and leafy greens, and how leafy vegetables provide fiber, helps maintain healthy weight, helps control blood glucose, and lowers cholesterol.  Discuss purchasing fresh or frozen vegetable to reduce sodium and not to add grease, fat or sugar. Consume <18oz of red meat per week. Consume lean cuts of meats and very little of meats high in sodium and nitrates such as pork and lunch meats. Discussed portion control for all food groups.   For heart healthy  choices add >50% of whole grains, make half their plate fruits and vegetables. Discuss the difference between starchy vegetables and leafy greens, and how leafy vegetables provide fiber, helps maintain healthy weight, helps control blood glucose, and lowers cholesterol.  Discuss purchasing fresh or frozen vegetable to reduce sodium and not to add grease, fat or sugar. Consume <18oz of red meat per week. Consume lean cuts of meats and very little of meats high in sodium and nitrates such as pork and lunch meats. Discussed portion control for all food groups.   For heart healthy choices add >50% of whole grains, make half their plate fruits and vegetables. Discuss the difference between starchy vegetables and leafy greens, and how leafy vegetables provide fiber, helps maintain healthy weight, helps control blood glucose, and lowers cholesterol.  Discuss purchasing fresh or frozen vegetable to reduce sodium and not to add grease, fat or sugar. Consume <18oz of red meat per week. Consume lean cuts of meats and very little of meats high in sodium and nitrates such as pork and lunch meats. Discussed portion control for all food groups.     Comment  Patient has lost 2.3 lbs since last 30 day review. He continues to say he is following a heart healthy diet. Will continue to monitor for progress.   Patient has maintained his weight since last 30 day review. He continues to say he is following a heart healthy diet. Will continue to monitor for progress.   Patient has gained 1 lb since last 30 day review. She says he is eating heart healthy. Will continue to monitor for progress.   Patient has gained 2 lbs since last 30 day review. He continues to say he is eating heart healthy. Will continue to monitor for progress.     Expected Outcome  Patient will continue to meet his nutrition goals.   Patient will continue to meet his nutrition goals.   Patient will continue to meet his nutrition goals.   Patient will continue to meet  his nutrition goals.        Nutrition Goals Discharge (Final Nutrition Goals Re-Evaluation): Nutrition Goals Re-Evaluation - 07/08/18 1537      Goals   Current Weight  210 lb (95.3 kg)    Nutrition Goal  For heart healthy choices add >50% of whole grains, make half their plate fruits and vegetables. Discuss the difference between starchy vegetables and leafy greens, and how leafy vegetables provide fiber, helps maintain healthy weight, helps control blood glucose, and lowers cholesterol.  Discuss purchasing fresh or frozen vegetable to reduce sodium and not to add grease, fat or sugar. Consume <18oz of red meat per week. Consume lean cuts of meats and  very little of meats high in sodium and nitrates such as pork and lunch meats. Discussed portion control for all food groups.     Comment  Patient has gained 2 lbs since last 30 day review. He continues to say he is eating heart healthy. Will continue to monitor for progress.     Expected Outcome  Patient will continue to meet his nutrition goals.        Psychosocial: Target Goals: Acknowledge presence or absence of significant depression and/or stress, maximize coping skills, provide positive support system. Participant is able to verbalize types and ability to use techniques and skills needed for reducing stress and depression.  Initial Review & Psychosocial Screening: Initial Psych Review & Screening - 03/03/18 1445      Initial Review   Current issues with  None Identified      Family Dynamics   Good Support System?  Yes    Comments  Patient initial QOL score was 27.44 and his PHQ-9 score was 7 with no psychosocial issues identified.       Barriers   Psychosocial barriers to participate in program  There are no identifiable barriers or psychosocial needs.      Screening Interventions   Interventions  Encouraged to exercise    Expected Outcomes  Short Term goal: Identification and review with participant of any Quality of Life or  Depression concerns found by scoring the questionnaire.;Long Term goal: The participant improves quality of Life and PHQ9 Scores as seen by post scores and/or verbalization of changes       Quality of Life Scores: Quality of Life - 03/03/18 1239      Quality of Life Scores   Health/Function Pre  24.88 %    Socioeconomic Pre  29.58 %    Psych/Spiritual Pre  29.64 %    Family Pre  30 %    GLOBAL Pre  27.44 %      Scores of 19 and below usually indicate a poorer quality of life in these areas.  A difference of  2-3 points is a clinically meaningful difference.  A difference of 2-3 points in the total score of the Quality of Life Index has been associated with significant improvement in overall quality of life, self-image, physical symptoms, and general health in studies assessing change in quality of life.   PHQ-9: Recent Review Flowsheet Data    Depression screen [facility] 2/9 03/03/2018   Decreased Interest 0   Down, Depressed, Hopeless 0   PHQ - 2 Score 0   Altered sleeping 0   Tired, decreased energy 3   Change in appetite 3   Feeling bad or failure about yourself  0   Trouble concentrating 1   Moving slowly or fidgety/restless 0   Suicidal thoughts 0   PHQ-9 Score 7   Difficult doing work/chores Not difficult at all     Interpretation of Total Score  Total Score Depression Severity:  1-4 = Minimal depression, 5-9 = Mild depression, 10-14 = Moderate depression, 15-19 = Moderately severe depression, 20-27 = Severe depression   Psychosocial Evaluation and Intervention: Psychosocial Evaluation - 03/03/18 1446      Psychosocial Evaluation & Interventions   Interventions  Relaxation education;Stress management education;Encouraged to exercise with the program and follow exercise prescription    Comments  There are no psychosocial issues identified at orientation.     Expected Outcomes  Patient will have no psychosocial issues identified at discharge.     Continue Psychosocial  Services   No Follow up required       Psychosocial Re-Evaluation: Psychosocial Re-Evaluation    [place] Name 04/01/18 1601 04/23/18 1512 05/20/18 1458 06/17/18 1559       Psychosocial Re-Evaluation   Current issues with  None Identified  None Identified  None Identified  None Identified    Comments  Patient's initial QOL score as 27.44 and his PHQ-9 score was 7 with no psychosocial issues identified.   Patient's initial QOL score as 27.44 and his PHQ-9 score was 7 with no psychosocial issues identified.   Patient's initial QOL score as 27.44 and his PHQ-9 score was 7 with no psychosocial issues identified.   Patient's initial QOL score as 27.44 and his PHQ-9 score was 7 with no psychosocial issues identified.     Expected Outcomes  Patient will have no psychosocial issues identified at discharge.   Patient will have no psychosocial issues identified at discharge.   Patient will have no psychosocial issues identified at discharge.   Patient will have no psychosocial issues identified at discharge.     Interventions  Relaxation education;Stress management education;Encouraged to attend Pulmonary Rehabilitation for the exercise  Relaxation education;Stress management education;Encouraged to attend Pulmonary Rehabilitation for the exercise  Relaxation education;Stress management education;Encouraged to attend Pulmonary Rehabilitation for the exercise  Relaxation education;Stress management education;Encouraged to attend Pulmonary Rehabilitation for the exercise    Continue Psychosocial Services   No Follow up required  No Follow up required  No Follow up required  No Follow up required       Psychosocial Discharge (Final Psychosocial Re-Evaluation): Psychosocial Re-Evaluation - 06/17/18 1559      Psychosocial Re-Evaluation   Current issues with  None Identified    Comments  Patient's initial QOL score as 27.44 and his PHQ-9 score was 7 with no psychosocial issues identified.     Expected Outcomes   Patient will have no psychosocial issues identified at discharge.     Interventions  Relaxation education;Stress management education;Encouraged to attend Pulmonary Rehabilitation for the exercise    Continue Psychosocial Services   No Follow up required        Education: Education Goals: Education classes will be provided on a weekly basis, covering required topics. Participant will state understanding/return demonstration of topics presented.  Learning Barriers/Preferences: Learning Barriers/Preferences - 03/03/18 1439      Learning Barriers/Preferences   Learning Barriers  None    Learning Preferences  Skilled Demonstration;Written Material       Education Topics: How Lungs Work and Diseases: - Discuss the anatomy of the lungs and diseases that can affect the lungs, such as COPD.   PULMONARY REHAB OTHER RESPIRATORY from 07/08/2018 in [place]  Date  03/17/18  Educator  D. Coad  Instruction Review Code  2- Demonstrated Understanding      Exercise: -Discuss the importance of exercise, FITT principles of exercise, normal and abnormal responses to exercise, and how to exercise safely.   PULMONARY REHAB OTHER RESPIRATORY from 07/08/2018 in [place]  Date  03/11/18  Educator  [place]  Instruction Review Code  2- Demonstrated Understanding      Environmental Irritants: -Discuss types of environmental irritants and how to limit exposure to environmental irritants.   PULMONARY REHAB OTHER RESPIRATORY from 07/08/2018 in [place]  Date  03/25/18  Educator  [place]  Instruction Review Code  2- Demonstrated Understanding      Meds/Inhalers and oxygen: - Discuss respiratory medications, definition of  an inhaler and oxygen, and the proper way to use an inhaler and oxygen.   PULMONARY REHAB OTHER RESPIRATORY from 07/08/2018 in [place]  Date  04/01/18  Educator  DC      Energy Saving  Techniques: - Discuss methods to conserve energy and decrease shortness of breath when performing activities of daily living.    PULMONARY REHAB OTHER RESPIRATORY from 07/08/2018 in [place]  Date  07/08/18  Educator  Etheleen Mayhew  Instruction Review Code  2- Demonstrated Understanding      Bronchial Hygiene / Breathing Techniques: - Discuss breathing mechanics, pursed-lip breathing technique,  proper posture, effective ways to clear airways, and other functional breathing techniques   PULMONARY REHAB OTHER RESPIRATORY from 07/08/2018 in [place]  Date  04/15/18  Educator  DC  Instruction Review Code  2- Demonstrated Understanding      Cleaning Equipment: - Provides group verbal and written instruction about the health risks of elevated stress, cause of high stress, and healthy ways to reduce stress.   Nutrition I: Fats: - Discuss the types of cholesterol, what cholesterol does to the body, and how cholesterol levels can be controlled.   Nutrition II: Labels: -Discuss the different components of food labels and how to read food labels.   PULMONARY REHAB OTHER RESPIRATORY from 07/08/2018 in [place]  Date  05/06/18  Educator  D. Coad  Instruction Review Code  2- Demonstrated Understanding      Respiratory Infections: - Discuss the signs and symptoms of respiratory infections, ways to prevent respiratory infections, and the importance of seeking medical treatment when having a respiratory infection.   Stress I: Signs and Symptoms: - Discuss the causes of stress, how stress may lead to anxiety and depression, and ways to limit stress.   Stress II: Relaxation: -Discuss relaxation techniques to limit stress.   PULMONARY REHAB OTHER RESPIRATORY from 07/08/2018 in [place]  Date  05/27/18  Educator  DC  Instruction Review Code  2- Demonstrated Understanding      Oxygen for  Home/Travel: - Discuss how to prepare for travel when on oxygen and proper ways to transport and store oxygen to ensure safety.   PULMONARY REHAB OTHER RESPIRATORY from 07/08/2018 in [place]  Date  06/02/18  Educator  Etheleen Mayhew  Instruction Review Code  2- Demonstrated Understanding      Knowledge Questionnaire Score: Knowledge Questionnaire Score - 03/03/18 1439      Knowledge Questionnaire Score   Pre Score  12/18       Core Components/Risk Factors/Patient Goals at Admission: Personal Goals and Risk Factors at Admission - 03/03/18 1443      Core Components/Risk Factors/Patient Goals on Admission    Weight Management  Yes    Admit Weight  210 lb (95.3 kg)    Goal Weight: Short Term  205 lb (93 kg)    Goal Weight: Long Term  200 lb (90.7 kg)    Expected Outcomes  Short Term: Continue to assess and modify interventions until short term weight is achieved;Long Term: Adherence to nutrition and physical activity/exercise program aimed toward attainment of established weight goal    Improve shortness of breath with ADL's  Yes    Intervention  Provide education, individualized exercise plan and daily activity instruction to help decrease symptoms of SOB with activities of daily living.    Expected Outcomes  Short Term: Improve cardiorespiratory fitness to achieve a  reduction of symptoms when performing ADLs;Long Term: Be able to perform more ADLs without symptoms or delay the onset of symptoms    Diabetes  Yes    Intervention  Provide education about signs/symptoms and action to take for hypo/hyperglycemia.;Provide education about proper nutrition, including hydration, and aerobic/resistive exercise prescription along with prescribed medications to achieve blood glucose in normal ranges: Fasting glucose 65-99 mg/dL    Expected Outcomes  Long Term: Attainment of HbA1C < 7%.    Personal Goal Other  Yes    Personal Goal  Improve DM control; breathe better; increase  strength.     Intervention  Patient will attend PR 2 days/week and supplement with exercise 3 days/week.     Expected Outcomes  Patient will meet his personal goals.        Core Components/Risk Factors/Patient Goals Review:  Goals and Risk Factor Review    Row Name 04/01/18 1557 04/23/18 1459 05/20/18 1453 06/17/18 1556 07/08/18 1538     Core Components/Risk Factors/Patient Goals Review   Personal Goals Review  Weight Management/Obesity;Improve shortness of breath with ADL's;Increase knowledge of respiratory medications and ability to use respiratory devices properly.;Diabetes Breathe better; Do ADL's without SOB; get stronger; lose 10 lbs.   Weight Management/Obesity;Improve shortness of breath with ADL's;Increase knowledge of respiratory medications and ability to use respiratory devices properly.;Diabetes Breathe better; do ADL's without SOB; get stronger; lsoe 10 lbs.   Weight Management/Obesity;Improve shortness of breath with ADL's;Increase knowledge of respiratory medications and ability to use respiratory devices properly.;Diabetes Breathe better; Do ADL's without SOB; Get stronger; Lose 10 lbs.   Weight Management/Obesity;Improve shortness of breath with ADL's;Increase knowledge of respiratory medications and ability to use respiratory devices properly.;Diabetes Breathe better; Do ADL's without SOB; get stronger; lose 10 lbs.   Weight Management/Obesity;Improve shortness of breath with ADL's;Increase knowledge of respiratory medications and ability to use respiratory devices properly.;Diabetes Breathe better; Do ADL's without SOB; Get stronger; Lose 10 lbs.    Review  Patient has completed 8 sessions losing 2.3 lbs since his initial visit. Patient says he can not see any improvement since he started the program. He is being evaluated by a cardiologist and says a stress test has been planned. He is not sure when. He thinks maybe something going on with his heart could be contributing to his SOB.  He has no recent Hgb A1C labs showing. He does not take his glucose often, so he does not always report a reading. Will continue to monitor for progress.   Patient has completed 12 sessions maintaining his weight since last 30 day review. Patient continues to say he does not see any improvement in his breathing or strength contrary to him telling our EP he is getting stronger. He feels like there is more going on than his ILD. He had a heart catheterization 04/20/18 with preliminary results looking like no blockages greater than 40% with finial results with treatment pending. Will continue to monitor for progress.     Patient has completed 15 sessions losing 6 lbs since he started the program. He has missed several sessions since last 30 day review due to cardiovascular workup which was negative. He continues to say that the program has not helped him in his breathing or strength. He is pleased with his weight loss. Will continue to monitor for progress.   Patient has completed 22 sessions losing 2 lbs since he started the program. He is doing well in the program with progression. He says he feels stronger  some days but not other days. He sees a little improvement in his breathing. He has not increased his activity a lot. Will continue to monitor for progress.   Patient has completed 27 sessions gaining 2 lbs since last 30 day review. His reported glucose readings are usually greater than 120. He says he is trying to follow a low carb diabetic diet. He continues to do well in the program with progression.  He has seen some improvement in his breathing but continues to say miminal improvement in his strength or endurance. Will continue to monitor for progress.    Expected Outcomes  Patient will continue to attend sessions and complete the program meeting his personal goals.   Patient will continue to attend sessions and complete the program meeting his personal goals.   Patient will continue to attend sessions and  complete the program meeting his personal goals.   Patient will continue to attend sessions and complete the program meeting his personal goals.   Patient will continue to attend sessions and complete the program meeting his personal goals.       Core Components/Risk Factors/Patient Goals at Discharge (Final Review):  Goals and Risk Factor Review - 07/08/18 1538      Core Components/Risk Factors/Patient Goals Review   Personal Goals Review  Weight Management/Obesity;Improve shortness of breath with ADL's;Increase knowledge of respiratory medications and ability to use respiratory devices properly.;Diabetes   Breathe better; Do ADL's without SOB; Get stronger; Lose 10 lbs.    Review  Patient has completed 27 sessions gaining 2 lbs since last 30 day review. His reported glucose readings are usually greater than 120. He says he is trying to follow a low carb diabetic diet. He continues to do well in the program with progression.  He has seen some improvement in his breathing but continues to say miminal improvement in his strength or endurance. Will continue to monitor for progress.     Expected Outcomes  Patient will continue to attend sessions and complete the program meeting his personal goals.        ITP Comments: ITP Comments    Row Name 03/10/18 1457 03/11/18 1616         ITP Comments  Patient new to the program completing 2 sessions. Will continue to monitor for progress.   Patient attend the Family Matters class with hosptial chaplian to discuss how this event has impacted their life.          Comments: ITP REVIEW Pt is making expected progress toward pulmonary rehab goals after completing 27 sessions. Recommend continued exercise, life style modification, education, and utilization of breathing techniques to increase stamina and strength and decrease shortness of breath with exertion.

## 2018-07-08 NOTE — Progress Notes (Signed)
Daily Session Note  Patient Details  Name: Manuel Atkins MRN: [id number] Date of Birth: 10/20/1933 Referring Provider:     PULMONARY REHAB OTHER RESP ORIENTATION from 03/03/2018 in [place] [facility]  Referring Provider  Dr. Mannam      Encounter Date: 07/08/2018  Check In: Session Check In - 07/08/18 1330      Check-In   Supervising physician immediately available to respond to emergencies  See telemetry face sheet for immediately available MD    Location  [facility]    Staff Present  Diane Coad, MS, EP, [facility], Exercise Physiologist;Other; , RN, BSN    Medication changes reported      No    Fall or balance concerns reported     Yes    Comments  Patient has a h/o of falls and reports balance issues.     Warm-up and Cool-down  Performed as group-led instruction    Resistance Training Performed  Yes    VAD Patient?  No    PAD/SET Patient?  No      Pain Assessment   Currently in Pain?  No/denies    Pain Score  0-No pain    Multiple Pain Sites  No       Capillary Blood Glucose: No results found for this or any previous visit (from the past 24 hour(s)).    Social History   Tobacco Use  Smoking Status Former Smoker  . Years: 20.00  . Types: Pipe, Cigars  . Last attempt to quit: 11/10/1968  . Years since quitting: 49.6  Smokeless Tobacco Never Used    Goals Met:  Proper associated with RPD/PD & O2 Sat Independence with exercise equipment Improved SOB with ADL's Using PLB without cueing & demonstrates good technique Exercise tolerated well No report of cardiac concerns or symptoms Strength training completed today  Goals Unmet:  Not Applicable  Comments: Pt able to follow exercise prescription today without complaint.  Will continue to monitor for progression. Checks out 1430.   Dr. Edward Hawkins is Medical Director for [facility]. 

## 2018-07-13 ENCOUNTER — Encounter (HOSPITAL_COMMUNITY)
Admission: RE | Admit: 2018-07-13 | Discharge: 2018-07-13 | Disposition: A | Discharge status: Home / Self Care | Payer: Medicare Other | Source: Ambulatory Visit | Attending: Pulmonary Disease | Admitting: Pulmonary Disease

## 2018-07-13 DIAGNOSIS — J849 Interstitial pulmonary disease, unspecified: Secondary | ICD-10-CM

## 2018-07-13 NOTE — Progress Notes (Signed)
Daily Session Note  Patient Details  Name: Manuel Atkins MRN: [id number] Date of Birth: 10-08-1933 Referring Provider:     PULMONARY REHAB OTHER RESP ORIENTATION from 03/03/2018 in [place]  Referring Provider  Dr. Vaughan Browner      Encounter Date: 07/13/2018  Check In: Session Check In - 07/13/18 1550      Check-In   Supervising physician immediately available to respond to emergencies  See telemetry face sheet for immediately available MD    Location  [facility]    Staff Present  Russella Dar, MS, EP, [facility], Exercise Physiologist;Other    Medication changes reported      No    Fall or balance concerns reported     Yes    Comments  Patient has a h/o of falls and reports balance issues.     Tobacco Cessation  No Change    Warm-up and Cool-down  Performed as group-led instruction    Resistance Training Performed  Yes    VAD Patient?  No    PAD/SET Patient?  No      Pain Assessment   Currently in Pain?  No/denies    Pain Score  0-No pain    Multiple Pain Sites  No       Capillary Blood Glucose: No results found for this or any previous visit (from the past 24 hour(s)).    Social History   Tobacco Use  Smoking Status Former Smoker  . Years: 20.00  . Types: Pipe, Cigars  . Last attempt to quit: 11/10/1968  . Years since quitting: 49.7  Smokeless Tobacco Never Used    Goals Met:  Proper associated with RPD/PD & O2 Sat Independence with exercise equipment Improved SOB with ADL's Exercise tolerated well No report of cardiac concerns or symptoms Strength training completed today  Goals Unmet:  Not Applicable  Comments: Pt able to follow exercise prescription today without complaint.  Will continue to monitor for progression. Checked out at 2:30pm.    Dr. Sinda Du is Medical Director for [facility].

## 2018-07-15 ENCOUNTER — Encounter (HOSPITAL_COMMUNITY)
Admission: RE | Admit: 2018-07-15 | Discharge: 2018-07-15 | Disposition: A | Discharge status: Home / Self Care | Payer: Medicare Other | Source: Ambulatory Visit | Attending: Pulmonary Disease | Admitting: Pulmonary Disease

## 2018-07-15 DIAGNOSIS — J849 Interstitial pulmonary disease, unspecified: Secondary | ICD-10-CM

## 2018-07-15 NOTE — Progress Notes (Signed)
Daily Session Note  Patient Details  Name: Manuel Atkins MRN: [id number] Date of Birth: 03-26-1933 Referring Provider:     PULMONARY REHAB OTHER RESP ORIENTATION from 03/03/2018 in [place]  Referring Provider  Dr. Vaughan Browner      Encounter Date: 07/15/2018  Check In: Session Check In - 07/15/18 1330      Check-In   Supervising physician immediately available to respond to emergencies  See telemetry face sheet for immediately available MD    Location  [facility]    Staff Present  Aundra Dubin, RN, BSN;Other    Medication changes reported      No    Fall or balance concerns reported     Yes    Comments  Patient has a h/o of falls and reports balance issues.     Tobacco Cessation  No Change    Warm-up and Cool-down  Performed as group-led instruction    Resistance Training Performed  Yes    VAD Patient?  No    PAD/SET Patient?  No      Pain Assessment   Currently in Pain?  No/denies    Pain Score  0-No pain    Multiple Pain Sites  No       Capillary Blood Glucose: No results found for this or any previous visit (from the past 24 hour(s)).    Social History   Tobacco Use  Smoking Status Former Smoker  . Years: 20.00  . Types: Pipe, Cigars  . Last attempt to quit: 11/10/1968  . Years since quitting: 49.7  Smokeless Tobacco Never Used    Goals Met:  Proper associated with RPD/PD & O2 Sat Independence with exercise equipment Using PLB without cueing & demonstrates good technique Exercise tolerated well No report of cardiac concerns or symptoms Strength training completed today  Goals Unmet:  Not Applicable  Comments: Pt able to follow exercise prescription today without complaint.  Will continue to monitor for progression. Checked out at 2:30pm.   Dr. Sinda Du is Medical Director for [facility].

## 2018-07-20 ENCOUNTER — Encounter (HOSPITAL_COMMUNITY)
Admission: RE | Admit: 2018-07-20 | Discharge: 2018-07-20 | Disposition: A | Discharge status: Home / Self Care | Payer: Medicare Other | Source: Ambulatory Visit | Attending: Pulmonary Disease | Admitting: Pulmonary Disease

## 2018-07-20 DIAGNOSIS — J849 Interstitial pulmonary disease, unspecified: Secondary | ICD-10-CM

## 2018-07-20 NOTE — Progress Notes (Signed)
**Note Manuel-Identified via Obfuscation** Daily Session Note  Patient Details  Name: Manuel Atkins MRN: [id number] Date of Birth: 1933-10-25 Referring Provider:     PULMONARY REHAB OTHER RESP ORIENTATION from 03/03/2018 in [place]  Referring Provider  Dr. Vaughan Browner      Encounter Date: 07/20/2018  Check In: Session Check In - 07/20/18 1508      Check-In   Supervising physician immediately available to respond to emergencies  See telemetry face sheet for immediately available MD    Location  [facility]    Staff Present  Russella Dar, MS, EP, [facility], Exercise Physiologist;Other    Medication changes reported      No    Fall or balance concerns reported     Yes    Comments  Patient has a h/o of falls and reports balance issues.     Tobacco Cessation  No Change    Warm-up and Cool-down  Performed as group-led instruction    Resistance Training Performed  Yes    VAD Patient?  No    PAD/SET Patient?  No      Pain Assessment   Currently in Pain?  No/denies    Pain Score  0-No pain    Multiple Pain Sites  No       Capillary Blood Glucose: No results found for this or any previous visit (from the past 24 hour(s)).    Social History   Tobacco Use  Smoking Status Former Smoker  . Years: 20.00  . Types: Pipe, Cigars  . Last attempt to quit: 11/10/1968  . Years since quitting: 49.7  Smokeless Tobacco Never Used    Goals Met:  Proper associated with RPD/PD & O2 Sat Independence with exercise equipment Using PLB without cueing & demonstrates good technique Exercise tolerated well No report of cardiac concerns or symptoms Strength training completed today  Goals Unmet:  Not Applicable  Comments: Pt able to follow exercise prescription today without complaint.  Will continue to monitor for progression. Checked out at 2:30.    Dr. Sinda Du is Medical Director for [facility].

## 2018-07-22 ENCOUNTER — Encounter (HOSPITAL_COMMUNITY)
Admission: RE | Admit: 2018-07-22 | Discharge: 2018-07-22 | Disposition: A | Discharge status: Home / Self Care | Payer: Medicare Other | Source: Ambulatory Visit | Attending: Pulmonary Disease | Admitting: Pulmonary Disease

## 2018-07-22 DIAGNOSIS — J849 Interstitial pulmonary disease, unspecified: Secondary | ICD-10-CM | POA: Diagnosis not present

## 2018-07-22 DIAGNOSIS — Z6828 Body mass index (BMI) 28.0-28.9, adult: Secondary | ICD-10-CM | POA: Diagnosis not present

## 2018-07-22 DIAGNOSIS — Z23 Encounter for immunization: Secondary | ICD-10-CM | POA: Diagnosis not present

## 2018-07-22 DIAGNOSIS — N183 Chronic kidney disease, stage 3 (moderate): Secondary | ICD-10-CM | POA: Diagnosis not present

## 2018-07-22 DIAGNOSIS — E782 Mixed hyperlipidemia: Secondary | ICD-10-CM | POA: Diagnosis not present

## 2018-07-22 DIAGNOSIS — K7581 Nonalcoholic steatohepatitis (NASH): Secondary | ICD-10-CM | POA: Diagnosis not present

## 2018-07-22 DIAGNOSIS — I10 Essential (primary) hypertension: Secondary | ICD-10-CM | POA: Diagnosis not present

## 2018-07-22 DIAGNOSIS — D696 Thrombocytopenia, unspecified: Secondary | ICD-10-CM | POA: Diagnosis not present

## 2018-07-22 DIAGNOSIS — E1122 Type 2 diabetes mellitus with diabetic chronic kidney disease: Secondary | ICD-10-CM | POA: Diagnosis not present

## 2018-07-22 NOTE — Progress Notes (Signed)
Daily Session Note  Patient Details  Name: Manuel Atkins MRN: [id number] Date of Birth: [birth year] Referring Provider:     PULMONARY REHAB OTHER RESP ORIENTATION from 03/03/2018 in [place]  Referring Provider  Dr. Vaughan Browner      Encounter Date: 07/22/2018  Check In: Session Check In - 07/22/18 1330      Check-In   Supervising physician immediately available to respond to emergencies  See telemetry face sheet for immediately available MD    Location  [facility]    Staff Present  Russella Dar, MS, EP, [facility], Exercise Physiologist;Cleda Imel Zachery Conch, Exercise Physiologist    Medication changes reported      No    Fall or balance concerns reported     Yes    Comments  Patient has a h/o of falls and reports balance issues.     Tobacco Cessation  No Change    Warm-up and Cool-down  Performed as group-led instruction    Resistance Training Performed  Yes    VAD Patient?  No    PAD/SET Patient?  No      Pain Assessment   Currently in Pain?  No/denies    Pain Score  0-No pain    Multiple Pain Sites  No       Capillary Blood Glucose: No results found for this or any previous visit (from the past 24 hour(s)).    Social History   Tobacco Use  Smoking Status Former Smoker  . Years: 20.00  . Types: Pipe, Cigars  . Last attempt to quit: 11/10/1968  . Years since quitting: 49.7  Smokeless Tobacco Never Used    Goals Met:  Proper associated with RPD/PD & O2 Sat Independence with exercise equipment Using PLB without cueing & demonstrates good technique Exercise tolerated well No report of cardiac concerns or symptoms Strength training completed today  Goals Unmet:  Not Applicable  Comments: Pt able to follow exercise prescription today without complaint.  Will continue to monitor for progression. Checked out at 2:30   Dr. Sinda Du is Medical Director for [facility].

## 2018-07-23 ENCOUNTER — Encounter (HOSPITAL_COMMUNITY)
Admission: RE | Admit: 2018-07-23 | Discharge: 2018-07-23 | Disposition: A | Discharge status: Home / Self Care | Payer: Medicare Other | Source: Ambulatory Visit | Attending: Internal Medicine | Admitting: Internal Medicine

## 2018-07-23 ENCOUNTER — Encounter (HOSPITAL_COMMUNITY): Payer: Self-pay

## 2018-07-23 DIAGNOSIS — K5190 Ulcerative colitis, unspecified, without complications: Secondary | ICD-10-CM | POA: Insufficient documentation

## 2018-07-23 MED ORDER — DIPHENHYDRAMINE HCL 25 MG PO CAPS: 50.0000 mg | ORAL_CAPSULE | Freq: Once | ORAL | Status: DC

## 2018-07-23 MED ORDER — ACETAMINOPHEN 325 MG PO TABS: 650.0000 mg | ORAL_TABLET | Freq: Once | ORAL | Status: DC

## 2018-07-23 MED ORDER — SODIUM CHLORIDE 0.9 % IV SOLN
500.0000 mg | Freq: Once | INTRAVENOUS | Status: AC
  Administered 2018-07-23: 500 mg via INTRAVENOUS
  Filled 2018-07-23: qty 50

## 2018-07-23 MED ORDER — SODIUM CHLORIDE 0.9 % IV SOLN
Freq: Once | INTRAVENOUS | Status: AC
Start: 2018-07-23 — End: 2018-07-23
  Administered 2018-07-23: 09:00:00 via INTRAVENOUS

## 2018-07-23 MED ORDER — LORATADINE 10 MG PO TABS: 10.0000 mg | ORAL_TABLET | Freq: Once | ORAL | Status: DC

## 2018-07-27 ENCOUNTER — Encounter (HOSPITAL_COMMUNITY)
Admission: RE | Admit: 2018-07-27 | Discharge: 2018-07-27 | Disposition: A | Discharge status: Home / Self Care | Payer: Medicare Other | Source: Ambulatory Visit | Attending: Pulmonary Disease | Admitting: Pulmonary Disease

## 2018-07-27 DIAGNOSIS — J849 Interstitial pulmonary disease, unspecified: Secondary | ICD-10-CM | POA: Diagnosis not present

## 2018-07-27 NOTE — Progress Notes (Signed)
Daily Session Note  Patient Details  Name: Manuel Atkins MRN: [id number] Date of Birth: 03/21/1933 Referring Provider:     PULMONARY REHAB OTHER RESP ORIENTATION from 03/03/2018 in [place]  Referring Provider  Dr. Vaughan Browner      Encounter Date: 07/27/2018  Check In: Session Check In - 07/27/18 1410      Check-In   Supervising physician immediately available to respond to emergencies  See telemetry face sheet for immediately available MD    Location  [facility]    Staff Present  Russella Dar, MS, EP, [facility], Exercise Physiologist;Other;Elfego Giammarino Zachery Conch, Exercise Physiologist    Medication changes reported      No    Tobacco Cessation  No Change    Warm-up and Cool-down  Performed as group-led instruction    Resistance Training Performed  Yes    VAD Patient?  No    PAD/SET Patient?  No      Pain Assessment   Currently in Pain?  No/denies    Pain Score  0-No pain    Multiple Pain Sites  No       Capillary Blood Glucose: No results found for this or any previous visit (from the past 24 hour(s)).    Social History   Tobacco Use  Smoking Status Former Smoker  . Years: 20.00  . Types: Pipe, Cigars  . Last attempt to quit: 11/10/1968  . Years since quitting: 49.7  Smokeless Tobacco Never Used    Goals Met:  Independence with exercise equipment Using PLB without cueing & demonstrates good technique Exercise tolerated well No report of cardiac concerns or symptoms Strength training completed today  Goals Unmet:  Not Applicable  Comments: Pt able to follow exercise prescription today without complaint.  Will continue to monitor for progression. Check out 2:30.    Dr. Sinda Du is Medical Director for [facility].

## 2018-07-29 ENCOUNTER — Encounter (HOSPITAL_COMMUNITY)
Admission: RE | Admit: 2018-07-29 | Discharge: 2018-07-29 | Disposition: A | Discharge status: Home / Self Care | Payer: Medicare Other | Source: Ambulatory Visit | Attending: Pulmonary Disease | Admitting: Pulmonary Disease

## 2018-07-29 DIAGNOSIS — J849 Interstitial pulmonary disease, unspecified: Secondary | ICD-10-CM | POA: Diagnosis not present

## 2018-07-29 NOTE — Progress Notes (Signed)
Daily Session Note  Patient Details  Name: Manuel Atkins MRN: [id number] Date of Birth: 07/21/1933 Referring Provider:     PULMONARY REHAB OTHER RESP ORIENTATION from 03/03/2018 in [place] [facility]  Referring Provider  Dr. Mannam      Encounter Date: 07/29/2018  Check In: Session Check In - 07/29/18 1330      Check-In   Supervising physician immediately available to respond to emergencies  See telemetry face sheet for immediately available MD    Location  [facility]    Staff Present  Diane Coad, MS, EP, [facility], Exercise Physiologist; , RN, BSN    Medication changes reported      No    Fall or balance concerns reported     Yes    Comments  Patient has a h/o of falls and reports balance issues.     Warm-up and Cool-down  Performed as group-led instruction    Resistance Training Performed  Yes    VAD Patient?  No    PAD/SET Patient?  No      Pain Assessment   Currently in Pain?  No/denies    Pain Score  0-No pain    Multiple Pain Sites  No       Capillary Blood Glucose: No results found for this or any previous visit (from the past 24 hour(s)).    Social History   Tobacco Use  Smoking Status Former Smoker  . Years: 20.00  . Types: Pipe, Cigars  . Last attempt to quit: 11/10/1968  . Years since quitting: 49.7  Smokeless Tobacco Never Used    Goals Met:  Proper associated with RPD/PD & O2 Sat Independence with exercise equipment Improved SOB with ADL's Using PLB without cueing & demonstrates good technique Exercise tolerated well No report of cardiac concerns or symptoms Strength training completed today  Goals Unmet:  Not Applicable  Comments: Pt able to follow exercise prescription today without complaint.  Will continue to monitor for progression. Check out 1430.   Dr. Edward Hawkins is Medical Director for [facility] [facility]. 

## 2018-07-30 NOTE — Progress Notes (Signed)
Pulmonary Individual Treatment Plan  Patient Details  Name: Manuel Atkins MRN: [id number] Date of Birth: 21-Jul-1933 Referring Provider:     PULMONARY REHAB OTHER RESP ORIENTATION from 03/03/2018 in [facility]  Referring Provider  Dr. Vaughan Browner      Initial Encounter Date:    [facility] from 03/03/2018 in [facility]  Date  03/03/18      Visit Diagnosis: ILD (interstitial lung disease) ([place])  Patient's Home Medications on Admission:   Current Outpatient Medications:  .  acetaminophen (TYLENOL) 500 MG tablet, Take 1,000 mg by mouth every 6 (six) hours as needed for moderate pain or headache., Disp: , Rfl:  .  ALPRAZolam (XANAX) 0.25 MG tablet, Take 0.5 tablets (0.125 mg total) by mouth 3 (three) times daily as needed for anxiety. for anxiety, Disp: 135 tablet, Rfl: 1 .  amLODipine (NORVASC) 2.5 MG tablet, Take 2.5 mg by mouth daily., Disp: , Rfl:  .  aspirin EC 81 MG tablet, Take 81 mg by mouth daily., Disp: , Rfl:  .  imipramine (TOFRANIL) 25 MG tablet, Take 75 mg by mouth at bedtime, Disp: , Rfl: 0 .  latanoprost (XALATAN) 0.005 % ophthalmic solution, Place 1 drop into both eyes at bedtime., Disp: , Rfl: 6 .  metFORMIN (GLUCOPHAGE-XR) 500 MG 24 hr tablet, Take 1,000 mg by mouth at bedtime., Disp: , Rfl: 1 .  nitroGLYCERIN (NITROSTAT) 0.4 MG SL tablet, Place 0.4 mg under the tongue every 5 (five) minutes as needed for chest pain. , Disp: , Rfl: 1 .  pantoprazole (PROTONIX) 40 MG tablet, Take 1 tablet (40 mg total) by mouth every other day., Disp: 45 tablet, Rfl: 3 .  Probiotic Product (PROBIOTIC DAILY PO), Take 1 capsule by mouth daily. , Disp: , Rfl:   Past Medical History: Past Medical History:  Diagnosis Date  . 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 Dyspnea   . Essential hypertension, benign   . GERD (gastroesophageal reflux disease)   . Headache(784.0)   . History of colon polyps   . History of transfusion of  whole blood   . Interstitial lung disease ([place]) 12/01/2017  . Mixed hyperlipidemia   . Obstructive sleep apnea (adult) (pediatric)    uses bipap @ HS  . Other malaise and fatigue   . Thrombocytopenia ([place]) 11/10/2012  . Type II or unspecified type diabetes mellitus without mention of complication, uncontrolled   . 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 Years: 20.00  . Types: Pipe, Cigars  . Last attempt to quit: 11/10/1968  . Years since quitting: 49.7  Smokeless Tobacco Never Used    Labs: Recent Chemical engineer    Labs for ITP Cardiac and Pulmonary Rehab Latest Ref Rng & Units 11/07/2012 04/20/2018 04/20/2018   Hemoglobin A1c <5.7 % 6.2(H) - -   PHART 7.350 - 7.450 - - 7.304(L)   PCO2ART 32.0 - 48.0 mmHg - - 42.2   HCO3 20.0 - 28.0 mmol/L - 22.3 21.0   TCO2 22 - 32 mmol/L - 24 22   ACIDBASEDEF 0.0 - 2.0 mmol/L - 4.0(H) 5.0(H)   O2SAT % - 63.0 93.0      Capillary Blood Glucose: Lab Results  Component Value Date   GLUCAP 140 (H) 04/20/2018   GLUCAP 168 (H) 04/20/2018   GLUCAP 206 (H) 04/20/2018   GLUCAP 139 (H) 12/15/2017   GLUCAP  133 (H) 06/16/2013     Pulmonary Assessment Scores: Pulmonary Assessment Scores    Row Name 03/03/18 1440         ADL UCSD   ADL Phase  Entry     SOB Score total  69     Rest  0     Walk  6     Stairs  4     Bath  4     Dress  4     Shop  3       CAT Score   CAT Score  18       mMRC Score   mMRC Score  3        Pulmonary Function Assessment: Pulmonary Function Assessment - 03/03/18 1452      Pulmonary Function Tests   FVC%  58 %    FEV1%  67 %    FEV1/FVC Ratio  82    DLCO%  14.81 %      Post Bronchodilator Spirometry Results   FVC%  54 %    FEV1%  66 %    FEV1/FVC Ratio  86       Exercise Target Goals: Exercise Program Goal: Individual exercise prescription set using results from initial 6 min walk test and THRR while  considering  patient's activity barriers and safety.   Exercise Prescription Goal: Initial exercise prescription builds to 30-45 minutes a day of aerobic activity, 2-3 days per week.  Home exercise guidelines will be given to patient during program as part of exercise prescription that the participant will acknowledge.  Activity Barriers & Risk Stratification: Activity Barriers & Cardiac Risk Stratification - 03/03/18 1405      Activity Barriers & Cardiac Risk Stratification   Activity Barriers  Shortness of Breath    Cardiac Risk Stratification  High       6 Minute Walk: 6 Minute Walk    Row Name 03/03/18 1404         6 Minute Walk   Phase  Initial     Distance  1100 feet     Distance % Change  0 %     Distance Feet Change  0 ft     Walk Time  6 minutes     # of Rest Breaks  0     MPH  2.08     METS  2.59     RPE  12     Perceived Dyspnea   14     VO2 Peak  7.16     Symptoms  No     Resting HR  77 bpm     Resting BP  154/74     Resting Oxygen Saturation   98 %     Exercise Oxygen Saturation  during 6 min walk  90 %     Max Ex. HR  79 bpm     Max Ex. BP  160/74     2 Minute Post BP  144/74        Oxygen Initial Assessment: Oxygen Initial Assessment - 03/03/18 1402      Home Oxygen   Home Oxygen Device  None    Sleep Oxygen Prescription  BiPAP    Home Exercise Oxygen Prescription  None    Home at Rest Exercise Oxygen Prescription  None      Initial 6 min Walk   Oxygen Used  None      Program Oxygen Prescription   Program Oxygen Prescription  None      Intervention   Short Term Goals  To learn and understand importance of monitoring SPO2 with pulse oximeter and demonstrate accurate use of the pulse oximeter.;To learn and understand importance of maintaining oxygen saturations>88%;To learn and demonstrate proper pursed lip breathing techniques or other breathing techniques.    Long  Term Goals  Verbalizes importance of monitoring SPO2 with pulse oximeter and  return demonstration;Maintenance of O2 saturations>88%;Exhibits proper breathing techniques, such as pursed lip breathing or other method taught during program session       Oxygen Re-Evaluation: Oxygen Re-Evaluation    Row Name 04/01/18 1553 04/23/18 1457 05/20/18 1453 06/17/18 1553 07/08/18 1536     Program Oxygen Prescription   Program Oxygen Prescription  None  None  None  None  None     Home Oxygen   Home Oxygen Device  None  None  None  None  None   Sleep Oxygen Prescription  BiPAP  BiPAP  BiPAP  BiPAP  BiPAP   Liters per minute  0  0  0  0  0   Home Exercise Oxygen Prescription  None  None  None  None  None   Home at Rest Exercise Oxygen Prescription  None  None  None  None  None   Compliance with Home Oxygen Use  -  -  -  Yes  Yes     Goals/Expected Outcomes   Short Term Goals  To learn and understand importance of monitoring SPO2 with pulse oximeter and demonstrate accurate use of the pulse oximeter.;To learn and understand importance of maintaining oxygen saturations>88%;To learn and demonstrate proper pursed lip breathing techniques or other breathing techniques.  To learn and understand importance of monitoring SPO2 with pulse oximeter and demonstrate accurate use of the pulse oximeter.;To learn and understand importance of maintaining oxygen saturations>88%;To learn and demonstrate proper pursed lip breathing techniques or other breathing techniques.  To learn and understand importance of monitoring SPO2 with pulse oximeter and demonstrate accurate use of the pulse oximeter.;To learn and understand importance of maintaining oxygen saturations>88%;To learn and demonstrate proper pursed lip breathing techniques or other breathing techniques.  To learn and understand importance of monitoring SPO2 with pulse oximeter and demonstrate accurate use of the pulse oximeter.;To learn and understand importance of maintaining oxygen saturations>88%;To learn and demonstrate proper pursed lip  breathing techniques or other breathing techniques.  To learn and understand importance of monitoring SPO2 with pulse oximeter and demonstrate accurate use of the pulse oximeter.;To learn and understand importance of maintaining oxygen saturations>88%;To learn and demonstrate proper pursed lip breathing techniques or other breathing techniques.   Long  Term Goals  Verbalizes importance of monitoring SPO2 with pulse oximeter and return demonstration;Maintenance of O2 saturations>88%;Exhibits proper breathing techniques, such as pursed lip breathing or other method taught during program session  Verbalizes importance of monitoring SPO2 with pulse oximeter and return demonstration;Maintenance of O2 saturations>88%;Exhibits proper breathing techniques, such as pursed lip breathing or other method taught during program session  Verbalizes importance of monitoring SPO2 with pulse oximeter and return demonstration;Maintenance of O2 saturations>88%;Exhibits proper breathing techniques, such as pursed lip breathing or other method taught during program session  Verbalizes importance of monitoring SPO2 with pulse oximeter and return demonstration;Maintenance of O2 saturations>88%;Exhibits proper breathing techniques, such as pursed lip breathing or other method taught during program session  Verbalizes importance of monitoring SPO2 with pulse oximeter and return demonstration;Maintenance of O2 saturations>88%;Exhibits proper breathing techniques, such as pursed lip breathing  or other method taught during program session   Comments  Patient demonstrates proper pursed lip breathing techniques during exercise and also demonstrates proper usage of pulse oximeter. He is able to verbalize the importance of maintaining his O2 saturations >88%. Will continue to monitor.   Patient demonstrates proper pursed lip breathing techniques during exercise and also demonstrates proper usage of pulse oximeter. He is able to verbalize the  importance of maintaining his O2 saturations >88%. Will continue to monitor.   Patient demonstrates proper pursed lip breathing techniques during exercise and also demonstrates proper usage of pulse oximeter. He is able to verbalize the importance of maintaining his O2 saturations >88%. Will continue to monitor.   Patient demonstrates proper pursed lip breathing techniques during exercise and also demonstrates proper usage of pulse oximeter. He is able to verbalize the importance of maintaining his O2 saturations >88%. Will continue to monitor.   Patient demonstrates proper pursed lip breathing techniques during exercise and also demonstrates proper usage of pulse oximeter. He is able to verbalize the importance of maintaining his O2 saturations >88%. Will continue to monitor.    Goals/Expected Outcomes  Patient will continue to meet his short and long term goals.   Patient will continue to meet his short and long term goals.   Patient will continue to meet his short and long term goals.   Patient will continue to meet his short and long term goals.   Patient will continue to meet his short and long term goals.    [place] Name 07/29/18 1540             Program Oxygen Prescription   Program Oxygen Prescription  None         Home Oxygen   Home Oxygen Device  None       Sleep Oxygen Prescription  BiPAP       Liters per minute  0       Home Exercise Oxygen Prescription  None       Home at Rest Exercise Oxygen Prescription  None       Compliance with Home Oxygen Use  Yes         Goals/Expected Outcomes   Short Term Goals  To learn and understand importance of monitoring SPO2 with pulse oximeter and demonstrate accurate use of the pulse oximeter.;To learn and understand importance of maintaining oxygen saturations>88%;To learn and demonstrate proper pursed lip breathing techniques or other breathing techniques.       Long  Term Goals  Verbalizes importance of monitoring SPO2 with pulse oximeter and  return demonstration;Maintenance of O2 saturations>88%;Exhibits proper breathing techniques, such as pursed lip breathing or other method taught during program session       Comments  Patient demonstrates proper pursed lip breathing techniques during exercise and also demonstrates proper usage of pulse oximeter. He is able to verbalize the importance of maintaining his O2 saturations >88%. Will continue to monitor.        Goals/Expected Outcomes  Patient will continue to meet his short and long term goals.           Oxygen Discharge (Final Oxygen Re-Evaluation): Oxygen Re-Evaluation - 07/29/18 1540      Program Oxygen Prescription   Program Oxygen Prescription  None      Home Oxygen   Home Oxygen Device  None    Sleep Oxygen Prescription  BiPAP    Liters per minute  0    Home Exercise Oxygen Prescription  None  Home at Rest Exercise Oxygen Prescription  None    Compliance with Home Oxygen Use  Yes      Goals/Expected Outcomes   Short Term Goals  To learn and understand importance of monitoring SPO2 with pulse oximeter and demonstrate accurate use of the pulse oximeter.;To learn and understand importance of maintaining oxygen saturations>88%;To learn and demonstrate proper pursed lip breathing techniques or other breathing techniques.    Long  Term Goals  Verbalizes importance of monitoring SPO2 with pulse oximeter and return demonstration;Maintenance of O2 saturations>88%;Exhibits proper breathing techniques, such as pursed lip breathing or other method taught during program session    Comments  Patient demonstrates proper pursed lip breathing techniques during exercise and also demonstrates proper usage of pulse oximeter. He is able to verbalize the importance of maintaining his O2 saturations >88%. Will continue to monitor.     Goals/Expected Outcomes  Patient will continue to meet his short and long term goals.        Initial Exercise Prescription: Initial Exercise Prescription -  03/03/18 1400      Date of Initial Exercise RX and Referring Provider   Date  03/03/18    Referring Provider  Dr. Vaughan Browner      Treadmill   MPH  1.3    Grade  0    Minutes  15    METs  1.9      NuStep   Level  1    SPM  74    Minutes  20    METs  1.9      Prescription Details   Frequency (times per week)  3    Duration  Progress to 30 minutes of continuous aerobic without signs/symptoms of physical distress      Intensity   THRR 40-80% of Max Heartrate  101-113-126    Ratings of Perceived Exertion  11-13    Perceived Dyspnea  0-4      Progression   Progression  Continue progressive overload as per policy without signs/symptoms or physical distress.      Resistance Training   Training Prescription  Yes    Weight  1    Reps  10-15       Perform Capillary Blood Glucose checks as needed.  Exercise Prescription Changes:  Exercise Prescription Changes    Row Name 03/11/18 1400 03/16/18 1000 03/30/18 0700 04/12/18 1400 04/28/18 1400     Response to Exercise   Blood Pressure (Admit)  -  138/68  130/70  130/60  120/68   Blood Pressure (Exercise)  -  160/70  150/74  150/68  150/62   Blood Pressure (Exit)  -  144/70  134/68  128/66  110/64   Heart Rate (Admit)  -  91 bpm  84 bpm  91 bpm  87 bpm   Heart Rate (Exercise)  -  73 bpm  99 bpm  91 bpm  80 bpm   Heart Rate (Exit)  -  82 bpm  89 bpm  91 bpm  95 bpm   Oxygen Saturation (Admit)  -  97 %  93 %  91 %  96 %   Oxygen Saturation (Exercise)  -  98 %  94 %  90 %  92 %   Oxygen Saturation (Exit)  -  91 %  90 %  97 %  93 %   Rating of Perceived Exertion (Exercise)  -  12  12  11  12    Perceived Dyspnea (Exercise)  -  12  12  11  13    Duration  -  Progress to 30 minutes of  aerobic without signs/symptoms of physical distress  Progress to 30 minutes of  aerobic without signs/symptoms of physical distress  Progress to 30 minutes of  aerobic without signs/symptoms of physical distress  Progress to 30 minutes of  aerobic without  signs/symptoms of physical distress   Intensity  -  THRR New 110-119-129  THRR New 106-116-127  THRR New 110-119-129  THRR New 107-118-128     Progression   Progression  -  Continue to progress workloads to maintain intensity without signs/symptoms of physical distress.  Continue to progress workloads to maintain intensity without signs/symptoms of physical distress.  Continue to progress workloads to maintain intensity without signs/symptoms of physical distress.  Continue to progress workloads to maintain intensity without signs/symptoms of physical distress.     Resistance Training   Training Prescription  Yes  Yes  Yes  Yes  Yes   Weight  1  1  2  2  3    Reps  10-15  10-15  10-15  10-15  10-15     Treadmill   MPH  1.3  1.3  1.5  1.7  1.7   Grade  0  0  0  0  0   Minutes  15  15  15  15  15    METs  1.9  1.9  2.15  2.3  2.3     NuStep   Level  1  1  1  2  2    SPM  74  95  92  125  115   Minutes  20  20  20  20  20    METs  1.9  1.9  1.8  1.8  1.9     Home Exercise Plan   Plans to continue exercise at  Home (comment)  Home (comment)  Home (comment)  Home (comment)  Home (comment)   Frequency  Add 2 additional days to program exercise sessions.  Add 2 additional days to program exercise sessions.  Add 2 additional days to program exercise sessions.  Add 2 additional days to program exercise sessions.  Add 2 additional days to program exercise sessions.   Initial Home Exercises Provided  03/11/18  03/11/18  03/11/18  03/11/18  03/11/18   Row Name 05/11/18 0800 05/30/18 1600 06/19/18 1200 07/07/18 1900 07/16/18 1400     Response to Exercise   Blood Pressure (Admit)  128/66  120/64  138/70  142/72  128/72   Blood Pressure (Exercise)  146/68  170/78  180/70  162/68  138/68   Blood Pressure (Exit)  128/66  130/66  140/70  114/70  118/60   Heart Rate (Admit)  102 bpm  99 bpm  86 bpm  94 bpm  91 bpm   Heart Rate (Exercise)  88 bpm  101 bpm  106 bpm  102 bpm  107 bpm   Heart Rate (Exit)  97  bpm  98 bpm  87 bpm  98 bpm  100 bpm   Oxygen Saturation (Admit)  96 %  99 %  94 %  98 %  97 %   Oxygen Saturation (Exercise)  90 %  99 %  90 %  96 %  90 %   Oxygen Saturation (Exit)  90 %  97 %  94 %  98 %  95 %   Rating of Perceived Exertion (Exercise)  12  11  12  11  12    Perceived Dyspnea (Exercise)  13  11  11  12  12    Duration  Progress to 30 minutes of  aerobic without signs/symptoms of physical distress  Progress to 30 minutes of  aerobic without signs/symptoms of physical distress  Progress to 30 minutes of  aerobic without signs/symptoms of physical distress  Progress to 30 minutes of  aerobic without signs/symptoms of physical distress  Progress to 30 minutes of  aerobic without signs/symptoms of physical distress   Intensity  THRR New 116-124-131  THRR New 114-122-130  THRR New 106-117-127  THRR unchanged  THRR unchanged     Progression   Progression  Continue to progress workloads to maintain intensity without signs/symptoms of physical distress.  Continue to progress workloads to maintain intensity without signs/symptoms of physical distress.  Continue to progress workloads to maintain intensity without signs/symptoms of physical distress.  Continue to progress workloads to maintain intensity without signs/symptoms of physical distress.  Continue to progress workloads to maintain intensity without signs/symptoms of physical distress.   Average METs  -  2.1  2.16  3.12  2.24     Resistance Training   Training Prescription  Yes  Yes  Yes  Yes  Yes   Weight  3  3  4  5  5    Reps  10-15  10-15  10-15  10-15  10-15   Time  -  5 Minutes  5 Minutes  5 Minutes  5 Minutes     Treadmill   MPH  1.7  1.7  2  2.1  2.2   Grade  0  0  0  0  0   Minutes  15  15  17  17  17    METs  2.3  2.3  2.53  2.6  2.68     NuStep   Level  2  2  3  3  4    SPM  97  118  123  128  112   Minutes  20  20  22  22  22    METs  2  1.9  1.8  1.9  1.8     Home Exercise Plan   Plans to continue exercise at   Home (comment)  Home (comment)  Home (comment)  Home (comment)  Home (comment)   Frequency  Add 2 additional days to program exercise sessions.  Add 2 additional days to program exercise sessions.  Add 2 additional days to program exercise sessions.  Add 2 additional days to program exercise sessions.  Add 2 additional days to program exercise sessions.   Initial Home Exercises Provided  03/11/18  03/11/18  03/11/18  03/11/18  03/11/18      Exercise Comments:  Exercise Comments    Row Name 03/11/18 1448 03/16/18 1027 03/30/18 0748 04/12/18 1449 04/28/18 1416   Exercise Comments  Patient recieved the take home exercise plan today 03/11/2018. THR was addressed as were safety guidelines for being active when not here in PR. Patient demonstrated an understanding and was encouraged to ask any future questions as they arise.   Patient has been doing well in PR. Patient has just started the program and is handling the initial exercise progression well. Patient will be progressed in time.   Patient is doing well in PR. Patient has increased his speed on the treadmill and his weights for the warm up portion of the session have increased to 2lbs. Patient has stated to  me that he feels some more strength. Patient has still only been for PR for 6 sessions. Patient will be progressed more over time to meet his goals.    Patient is doing well in PR. Patient has increased his level on the nustep machine and increased his speed on the treadmill to 1.7. Patient states that he feels more energy to do tasks around the house.   Patient has been doing well in PR, increasing his weight on the warm up portion of the sessions to 3lbs and maintaining his levels on the machines. Patient has not attended class since 04/15/2018 so no more progression has yet been made.    [place] Name 05/11/18 0818 06/19/18 1258 07/07/18 1935 07/16/18 1459 07/30/18 0740   Exercise Comments  Patient is doing well in PR. Patient has just returned to PR from  operations and studies and has not been progressed. Patient will be progressed in time.   Patient is progressing at a steady rate. He is breathing better and increasing in weights and equipment workloads.   Patient is progressing at a steady rate. He is breathing better and increasing in weights and equipment workloads.   Patient is progressing at a steady rate. He is breathing better and increasing in weights and equipment workloads.   Patient is progressing at a steady rate. He is breathing better and increasing in weights and equipment workloads.       Exercise Goals and Review:  Exercise Goals    Row Name 03/03/18 1406             Exercise Goals   Increase Physical Activity  Yes       Intervention  Provide advice, education, support and counseling about physical activity/exercise needs.;Develop an individualized exercise prescription for aerobic and resistive training based on initial evaluation findings, risk stratification, comorbidities and participant's personal goals.       Expected Outcomes  Short Term: Attend rehab on a regular basis to increase amount of physical activity.       Increase Strength and Stamina  Yes       Intervention  Provide advice, education, support and counseling about physical activity/exercise needs.;Develop an individualized exercise prescription for aerobic and resistive training based on initial evaluation findings, risk stratification, comorbidities and participant's personal goals.       Expected Outcomes  Short Term: Increase workloads from initial exercise prescription for resistance, speed, and METs.       Able to understand and use rate of perceived exertion (RPE) scale  Yes       Intervention  Provide education and explanation on how to use RPE scale       Expected Outcomes  Short Term: Able to use RPE daily in rehab to express subjective intensity level;Long Term:  Able to use RPE to guide intensity level when exercising independently       Able to  understand and use Dyspnea scale  Yes       Intervention  Provide education and explanation on how to use Dyspnea scale       Expected Outcomes  Short Term: Able to use Dyspnea scale daily in rehab to express subjective sense of shortness of breath during exertion;Long Term: Able to use Dyspnea scale to guide intensity level when exercising independently       Knowledge and understanding of Target Heart Rate Range (THRR)  Yes       Intervention  Provide education and explanation of THRR including how the numbers  were predicted and where they are located for reference       Expected Outcomes  Short Term: Able to state/look up THRR;Long Term: Able to use THRR to govern intensity when exercising independently;Short Term: Able to use daily as guideline for intensity in rehab       Able to check pulse independently  Yes       Intervention  Provide education and demonstration on how to check pulse in carotid and radial arteries.;Review the importance of being able to check your own pulse for safety during independent exercise       Expected Outcomes  Short Term: Able to explain why pulse checking is important during independent exercise;Long Term: Able to check pulse independently and accurately       Understanding of Exercise Prescription  Yes       Intervention  Provide education, explanation, and written materials on patient's individual exercise prescription       Expected Outcomes  Short Term: Able to explain program exercise prescription;Long Term: Able to explain home exercise prescription to exercise independently          Exercise Goals Re-Evaluation : Exercise Goals Re-Evaluation    Row Name 03/30/18 0746 04/20/18 0831 05/17/18 1511 06/19/18 1248 07/07/18 1932     Exercise Goal Re-Evaluation   Exercise Goals Review  Increase Physical Activity;Increase Strength and Stamina;Able to check pulse independently;Able to understand and use rate of perceived exertion (RPE) scale;Knowledge and  understanding of Target Heart Rate Range (THRR);Able to understand and use Dyspnea scale;Understanding of Exercise Prescription  Increase Physical Activity;Increase Strength and Stamina;Able to check pulse independently;Able to understand and use rate of perceived exertion (RPE) scale;Knowledge and understanding of Target Heart Rate Range (THRR);Able to understand and use Dyspnea scale;Understanding of Exercise Prescription  Increase Physical Activity;Increase Strength and Stamina;Able to check pulse independently;Able to understand and use rate of perceived exertion (RPE) scale;Knowledge and understanding of Target Heart Rate Range (THRR);Able to understand and use Dyspnea scale;Understanding of Exercise Prescription  Increase Strength and Stamina;Able to understand and use rate of perceived exertion (RPE) scale;Able to understand and use Dyspnea scale;Knowledge and understanding of Target Heart Rate Range (THRR);Understanding of Exercise Prescription  Increase Physical Activity;Increase Strength and Stamina;Able to understand and use rate of perceived exertion (RPE) scale;Able to understand and use Dyspnea scale;Understanding of Exercise Prescription   Comments  Patient is doing well in PR. Patient has increased his speed on the treadmill and his weights for the warm up portion of the session have increased to 2lbs. Patient has stated to me that he feels some more strength. Patient has still only been for PR for 6 sessions. Patient will be progressed more over time to meet his goals.    Patient has been doing very well in PR. Patient has stated to me that he feels much more energy and strength from the program. Patient has become less SOB he states. Patient has been doing well on his nustep and has been progressed to level 2 on the machine.   Patient has been doing very well in CR. Patient has just recently returned from getting several tests done that have hindered his attendance. Patient has stated to me that  he has much better strength since beginning the program however he is still working on being able to breathe better and working on breathing techniques.   Patient has been able to handle increases in weights and equipment workloads. His blood sugar readings fasting is still high. Patient feels he  is breathing better. He is not sure this is making him stronger. We reassure him that he is improving based on his being able to handle increases in his workloads. Patient has lost 4lbs from start of program. Has not lost any more and is not gaining any weight either. Weight staying between 206-208lbs.   Patient has been able to handle increases in weights and equipment workloads. His blood sugar readings fasting is still high. Patient feels he is breathing better. He is not sure this is making him stronger. We reassure him that he is improving based on his being able to handle increases in his workloads. Patient has lost 4lbs from start of program. Has not lost any more and is not gaining any weight either. Weight holding his weight at 210lbs.    Expected Outcomes  Patient wishes to lose weight, breathe better, and to gain strength.   Patient wishes to lose weight, breathe better, and to gain strength.   Patient wishes to lose weight, breathe better, and to gain strength.   More DM control, breath better, lose weight and improve strength  More DM control, breath better, lose weight and improve strength   Row Name 07/16/18 1457 07/30/18 0739           Exercise Goal Re-Evaluation   Exercise Goals Review  Increase Physical Activity;Increase Strength and Stamina;Able to understand and use rate of perceived exertion (RPE) scale;Able to understand and use Dyspnea scale;Understanding of Exercise Prescription  Increase Physical Activity;Increase Strength and Stamina;Able to understand and use rate of perceived exertion (RPE) scale;Able to understand and use Dyspnea scale;Understanding of Exercise Prescription       Comments  Patient has been able to handle increases in weights and equipment workloads. He has increased his level on the nustep to 4 and his speed on the treadmill to 2.2 mph. Patient feels he is breathing better. We reassure him that he is improving based on his being able to handle increases in his workloads.  Weight holding his weight at 210lbs.   Patient has been able to handle increases in weights and equipment workloads. He has increased his level on the nustep to 4 and his speed on the treadmill to 2.2 mph. Will increase his speed to 2.3. Patient feels he is breathing better. We reassure him that he is improving based on his being able to handle increases in his workloads.  Weight holding his weight at 210lbs.       Expected Outcomes  More DM control, breath better, lose weight and improve strength  More DM control, breath better, lose weight and improve strength         Discharge Exercise Prescription (Final Exercise Prescription Changes): Exercise Prescription Changes - 07/16/18 1400      Response to Exercise   Blood Pressure (Admit)  128/72    Blood Pressure (Exercise)  138/68    Blood Pressure (Exit)  118/60    Heart Rate (Admit)  91 bpm    Heart Rate (Exercise)  107 bpm    Heart Rate (Exit)  100 bpm    Oxygen Saturation (Admit)  97 %    Oxygen Saturation (Exercise)  90 %    Oxygen Saturation (Exit)  95 %    Rating of Perceived Exertion (Exercise)  12    Perceived Dyspnea (Exercise)  12    Duration  Progress to 30 minutes of  aerobic without signs/symptoms of physical distress    Intensity  THRR unchanged  Progression   Progression  Continue to progress workloads to maintain intensity without signs/symptoms of physical distress.    Average METs  2.24      Resistance Training   Training Prescription  Yes    Weight  5    Reps  10-15    Time  5 Minutes      Treadmill   MPH  2.2    Grade  0    Minutes  17    METs  2.68      NuStep   Level  4    SPM  112     Minutes  22    METs  1.8      Home Exercise Plan   Plans to continue exercise at  Home (comment)    Frequency  Add 2 additional days to program exercise sessions.    Initial Home Exercises Provided  03/11/18       Nutrition:  Target Goals: Understanding of nutrition guidelines, daily intake of sodium <1577m, cholesterol <2017m calories 30% from fat and 7% or less from saturated fats, daily to have 5 or more servings of fruits and vegetables.  Biometrics: Pre Biometrics - 03/03/18 1406      Pre Biometrics   Height  5' 11"  (1.803 m)    Weight  210 lb (95.3 kg)    Waist Circumference  42 inches    Hip Circumference  43 inches    Waist to Hip Ratio  0.98 %    BMI (Calculated)  29.3    Triceps Skinfold  9 mm    % Body Fat  27.2 %    Grip Strength  62.29 kg    Flexibility  0 in    Single Leg Stand  2 seconds        Nutrition Therapy Plan and Nutrition Goals: Nutrition Therapy & Goals - 03/11/18 1616      Personal Nutrition Goals   Nutrition Goal  For heart healthy choices add >50% of whole grains, make half their plate fruits and vegetables. Discuss the difference between starchy vegetables and leafy greens, and how leafy vegetables provide fiber, helps maintain healthy weight, helps control blood glucose, and lowers cholesterol.  Discuss purchasing fresh or frozen vegetable to reduce sodium and not to add grease, fat or sugar. Consume <18oz of red meat per week. Consume lean cuts of meats and very little of meats high in sodium and nitrates such as pork and lunch meats. Discussed portion control for all food groups.     Additional Goals?  No      Intervention Plan   Intervention  Nutrition handout(s) given to patient.    Expected Outcomes  Short Term Goal: Understand basic principles of dietary content, such as calories, fat, sodium, cholesterol and nutrients.       Nutrition Assessments: Nutrition Assessments - 03/03/18 1443      MEDFICTS Scores   Pre Score  36        Nutrition Goals Re-Evaluation: Nutrition Goals Re-Evaluation    Row Name 04/01/18 1555 04/23/18 1458 06/17/18 1553 07/08/18 1537 07/29/18 1541     Goals   Current Weight  207 lb 11.2 oz (94.2 kg)  207 lb 12.8 oz (94.3 kg)  208 lb (94.3 kg)  210 lb (95.3 kg)  209 lb (94.8 kg)   Nutrition Goal  For heart healthy choices add >50% of whole grains, make half their plate fruits and vegetables. Discuss the difference between starchy vegetables and leafy greens,  and how leafy vegetables provide fiber, helps maintain healthy weight, helps control blood glucose, and lowers cholesterol.  Discuss purchasing fresh or frozen vegetable to reduce sodium and not to add grease, fat or sugar. Consume <18oz of red meat per week. Consume lean cuts of meats and very little of meats high in sodium and nitrates such as pork and lunch meats. Discussed portion control for all food groups.   For heart healthy choices add >50% of whole grains, make half their plate fruits and vegetables. Discuss the difference between starchy vegetables and leafy greens, and how leafy vegetables provide fiber, helps maintain healthy weight, helps control blood glucose, and lowers cholesterol.  Discuss purchasing fresh or frozen vegetable to reduce sodium and not to add grease, fat or sugar. Consume <18oz of red meat per week. Consume lean cuts of meats and very little of meats high in sodium and nitrates such as pork and lunch meats. Discussed portion control for all food groups.   For heart healthy choices add >50% of whole grains, make half their plate fruits and vegetables. Discuss the difference between starchy vegetables and leafy greens, and how leafy vegetables provide fiber, helps maintain healthy weight, helps control blood glucose, and lowers cholesterol.  Discuss purchasing fresh or frozen vegetable to reduce sodium and not to add grease, fat or sugar. Consume <18oz of red meat per week. Consume lean cuts of meats and very little of  meats high in sodium and nitrates such as pork and lunch meats. Discussed portion control for all food groups.   For heart healthy choices add >50% of whole grains, make half their plate fruits and vegetables. Discuss the difference between starchy vegetables and leafy greens, and how leafy vegetables provide fiber, helps maintain healthy weight, helps control blood glucose, and lowers cholesterol.  Discuss purchasing fresh or frozen vegetable to reduce sodium and not to add grease, fat or sugar. Consume <18oz of red meat per week. Consume lean cuts of meats and very little of meats high in sodium and nitrates such as pork and lunch meats. Discussed portion control for all food groups.   For heart healthy choices add >50% of whole grains, make half their plate fruits and vegetables. Discuss the difference between starchy vegetables and leafy greens, and how leafy vegetables provide fiber, helps maintain healthy weight, helps control blood glucose, and lowers cholesterol.  Discuss purchasing fresh or frozen vegetable to reduce sodium and not to add grease, fat or sugar. Consume <18oz of red meat per week. Consume lean cuts of meats and very little of meats high in sodium and nitrates such as pork and lunch meats. Discussed portion control for all food groups.    Comment  Patient has lost 2.3 lbs since last 30 day review. He continues to say he is following a heart healthy diet. Will continue to monitor for progress.   Patient has maintained his weight since last 30 day review. He continues to say he is following a heart healthy diet. Will continue to monitor for progress.   Patient has gained 1 lb since last 30 day review. She says he is eating heart healthy. Will continue to monitor for progress.   Patient has gained 2 lbs since last 30 day review. He continues to say he is eating heart healthy. Will continue to monitor for progress.   Patient has lost 1 lb since last 30 day review. He continues to say he is eating  heart healthy. Will continue to monitor for progress.  Expected Outcome  Patient will continue to meet his nutrition goals.   Patient will continue to meet his nutrition goals.   Patient will continue to meet his nutrition goals.   Patient will continue to meet his nutrition goals.   Patient will continue to meet his nutrition goals.       Nutrition Goals Discharge (Final Nutrition Goals Re-Evaluation): Nutrition Goals Re-Evaluation - 07/29/18 1541      Goals   Current Weight  209 lb (94.8 kg)    Nutrition Goal  For heart healthy choices add >50% of whole grains, make half their plate fruits and vegetables. Discuss the difference between starchy vegetables and leafy greens, and how leafy vegetables provide fiber, helps maintain healthy weight, helps control blood glucose, and lowers cholesterol.  Discuss purchasing fresh or frozen vegetable to reduce sodium and not to add grease, fat or sugar. Consume <18oz of red meat per week. Consume lean cuts of meats and very little of meats high in sodium and nitrates such as pork and lunch meats. Discussed portion control for all food groups.     Comment  Patient has lost 1 lb since last 30 day review. He continues to say he is eating heart healthy. Will continue to monitor for progress.     Expected Outcome  Patient will continue to meet his nutrition goals.        Psychosocial: Target Goals: Acknowledge presence or absence of significant depression and/or stress, maximize coping skills, provide positive support system. Participant is able to verbalize types and ability to use techniques and skills needed for reducing stress and depression.  Initial Review & Psychosocial Screening: Initial Psych Review & Screening - 03/03/18 1445      Initial Review   Current issues with  None Identified      Family Dynamics   Good Support System?  Yes    Comments  Patient initial QOL score was 27.44 and his PHQ-9 score was 7 with no psychosocial issues  identified.       Barriers   Psychosocial barriers to participate in program  There are no identifiable barriers or psychosocial needs.      Screening Interventions   Interventions  Encouraged to exercise    Expected Outcomes  Short Term goal: Identification and review with participant of any Quality of Life or Depression concerns found by scoring the questionnaire.;Long Term goal: The participant improves quality of Life and PHQ9 Scores as seen by post scores and/or verbalization of changes       Quality of Life Scores: Quality of Life - 03/03/18 1239      Quality of Life Scores   Health/Function Pre  24.88 %    Socioeconomic Pre  29.58 %    Psych/Spiritual Pre  29.64 %    Family Pre  30 %    GLOBAL Pre  27.44 %      Scores of 19 and below usually indicate a poorer quality of life in these areas.  A difference of  2-3 points is a clinically meaningful difference.  A difference of 2-3 points in the total score of the Quality of Life Index has been associated with significant improvement in overall quality of life, self-image, physical symptoms, and general health in studies assessing change in quality of life.   PHQ-9: Recent Review Flowsheet Data    Depression screen [facility] 2/9 03/03/2018   Decreased Interest 0   Down, Depressed, Hopeless 0   PHQ - 2 Score 0  Altered sleeping 0   Tired, decreased energy 3   Change in appetite 3   Feeling bad or failure about yourself  0   Trouble concentrating 1   Moving slowly or fidgety/restless 0   Suicidal thoughts 0   PHQ-9 Score 7   Difficult doing work/chores Not difficult at all     Interpretation of Total Score  Total Score Depression Severity:  1-4 = Minimal depression, 5-9 = Mild depression, 10-14 = Moderate depression, 15-19 = Moderately severe depression, 20-27 = Severe depression   Psychosocial Evaluation and Intervention: Psychosocial Evaluation - 03/03/18 1446      Psychosocial Evaluation & Interventions   Interventions   Relaxation education;Stress management education;Encouraged to exercise with the program and follow exercise prescription    Comments  There are no psychosocial issues identified at orientation.     Expected Outcomes  Patient will have no psychosocial issues identified at discharge.     Continue Psychosocial Services   No Follow up required       Psychosocial Re-Evaluation: Psychosocial Re-Evaluation    [place] Name 04/01/18 1601 04/23/18 1512 05/20/18 1458 06/17/18 1559 07/29/18 1545     Psychosocial Re-Evaluation   Current issues with  None Identified  None Identified  None Identified  None Identified  None Identified   Comments  Patient's initial QOL score as 27.44 and his PHQ-9 score was 7 with no psychosocial issues identified.   Patient's initial QOL score as 27.44 and his PHQ-9 score was 7 with no psychosocial issues identified.   Patient's initial QOL score as 27.44 and his PHQ-9 score was 7 with no psychosocial issues identified.   Patient's initial QOL score as 27.44 and his PHQ-9 score was 7 with no psychosocial issues identified.   Patient's initial QOL score as 27.44 and his PHQ-9 score was 7 with no psychosocial issues identified.    Expected Outcomes  Patient will have no psychosocial issues identified at discharge.   Patient will have no psychosocial issues identified at discharge.   Patient will have no psychosocial issues identified at discharge.   Patient will have no psychosocial issues identified at discharge.   Patient will have no psychosocial issues identified at discharge.    Interventions  Relaxation education;Stress management education;Encouraged to attend Pulmonary Rehabilitation for the exercise  Relaxation education;Stress management education;Encouraged to attend Pulmonary Rehabilitation for the exercise  Relaxation education;Stress management education;Encouraged to attend Pulmonary Rehabilitation for the exercise  Relaxation education;Stress management  education;Encouraged to attend Pulmonary Rehabilitation for the exercise  Relaxation education;Stress management education;Encouraged to attend Pulmonary Rehabilitation for the exercise   Continue Psychosocial Services   No Follow up required  No Follow up required  No Follow up required  No Follow up required  No Follow up required      Psychosocial Discharge (Final Psychosocial Re-Evaluation): Psychosocial Re-Evaluation - 07/29/18 1545      Psychosocial Re-Evaluation   Current issues with  None Identified    Comments  Patient's initial QOL score as 27.44 and his PHQ-9 score was 7 with no psychosocial issues identified.     Expected Outcomes  Patient will have no psychosocial issues identified at discharge.     Interventions  Relaxation education;Stress management education;Encouraged to attend Pulmonary Rehabilitation for the exercise    Continue Psychosocial Services   No Follow up required        Education: Education Goals: Education classes will be provided on a weekly basis, covering required topics. Participant will state understanding/return demonstration of topics  presented.  Learning Barriers/Preferences: Learning Barriers/Preferences - 03/03/18 1439      Learning Barriers/Preferences   Learning Barriers  None    Learning Preferences  Skilled Demonstration;Written Material       Education Topics: How Lungs Work and Diseases: - Discuss the anatomy of the lungs and diseases that can affect the lungs, such as COPD.   PULMONARY REHAB OTHER RESPIRATORY from 07/29/2018 in [place]  Date  03/17/18  Educator  D. Huong Luthi  Instruction Review Code  2- Demonstrated Understanding      Exercise: -Discuss the importance of exercise, FITT principles of exercise, normal and abnormal responses to exercise, and how to exercise safely.   PULMONARY REHAB OTHER RESPIRATORY from 07/29/2018 in [place]  Date  03/11/18  Educator  [place]   Instruction Review Code  2- Demonstrated Understanding      Environmental Irritants: -Discuss types of environmental irritants and how to limit exposure to environmental irritants.   PULMONARY REHAB OTHER RESPIRATORY from 07/29/2018 in [place]  Date  03/25/18  Educator  [place]  Instruction Review Code  2- Demonstrated Understanding      Meds/Inhalers and oxygen: - Discuss respiratory medications, definition of an inhaler and oxygen, and the proper way to use an inhaler and oxygen.   PULMONARY REHAB OTHER RESPIRATORY from 07/29/2018 in [place]  Date  04/01/18  Educator  [place]      Energy Saving Techniques: - Discuss methods to conserve energy and decrease shortness of breath when performing activities of daily living.    PULMONARY REHAB OTHER RESPIRATORY from 07/29/2018 in [place]  Date  07/08/18  Educator  Etheleen Mayhew  Instruction Review Code  2- Demonstrated Understanding      Bronchial Hygiene / Breathing Techniques: - Discuss breathing mechanics, pursed-lip breathing technique,  proper posture, effective ways to clear airways, and other functional breathing techniques   PULMONARY REHAB OTHER RESPIRATORY from 07/29/2018 in [place]  Date  04/15/18  Educator  DC  Instruction Review Code  2- Demonstrated Understanding      Cleaning Equipment: - Provides group verbal and written instruction about the health risks of elevated stress, cause of high stress, and healthy ways to reduce stress.   PULMONARY REHAB OTHER RESPIRATORY from 07/29/2018 in [place]  Date  07/22/18  Educator  D. Javae Braaten   Instruction Review Code  2- Demonstrated Understanding      Nutrition I: Fats: - Discuss the types of cholesterol, what cholesterol does to the body, and how cholesterol levels can be controlled.   PULMONARY REHAB OTHER RESPIRATORY from 07/29/2018 in [place]  Date  07/29/18  Educator  Etheleen Mayhew  Instruction Review Code  2- Demonstrated Understanding      Nutrition II: Labels: -Discuss the different components of food labels and how to read food labels.   PULMONARY REHAB OTHER RESPIRATORY from 07/29/2018 in [place]  Date  05/06/18  Educator  D. Trameka Dorough  Instruction Review Code  2- Demonstrated Understanding      Respiratory Infections: - Discuss the signs and symptoms of respiratory infections, ways to prevent respiratory infections, and the importance of seeking medical treatment when having a respiratory infection.   Stress I: Signs and Symptoms: - Discuss the causes of stress, how stress may lead to anxiety and depression, and ways to limit stress.   Stress II: Relaxation: -Discuss relaxation techniques to limit  stress.   PULMONARY REHAB OTHER RESPIRATORY from 07/29/2018 in [place]  Date  05/27/18  Educator  DC  Instruction Review Code  2- Demonstrated Understanding      Oxygen for Home/Travel: - Discuss how to prepare for travel when on oxygen and proper ways to transport and store oxygen to ensure safety.   PULMONARY REHAB OTHER RESPIRATORY from 07/29/2018 in [place]  Date  06/02/18  Educator  Etheleen Mayhew  Instruction Review Code  2- Demonstrated Understanding      Knowledge Questionnaire Score: Knowledge Questionnaire Score - 03/03/18 1439      Knowledge Questionnaire Score   Pre Score  12/18       Core Components/Risk Factors/Patient Goals at Admission: Personal Goals and Risk Factors at Admission - 03/03/18 1443      Core Components/Risk Factors/Patient Goals on Admission    Weight Management  Yes    Admit Weight  210 lb (95.3 kg)    Goal Weight: Short Term  205 lb (93 kg)    Goal Weight: Long Term  200 lb (90.7 kg)    Expected Outcomes  Short Term: Continue to assess and modify interventions until short term weight is  achieved;Long Term: Adherence to nutrition and physical activity/exercise program aimed toward attainment of established weight goal    Improve shortness of breath with ADL's  Yes    Intervention  Provide education, individualized exercise plan and daily activity instruction to help decrease symptoms of SOB with activities of daily living.    Expected Outcomes  Short Term: Improve cardiorespiratory fitness to achieve a reduction of symptoms when performing ADLs;Long Term: Be able to perform more ADLs without symptoms or delay the onset of symptoms    Diabetes  Yes    Intervention  Provide education about signs/symptoms and action to take for hypo/hyperglycemia.;Provide education about proper nutrition, including hydration, and aerobic/resistive exercise prescription along with prescribed medications to achieve blood glucose in normal ranges: Fasting glucose 65-99 mg/dL    Expected Outcomes  Long Term: Attainment of HbA1C < 7%.    Personal Goal Other  Yes    Personal Goal  Improve DM control; breathe better; increase strength.     Intervention  Patient will attend PR 2 days/week and supplement with exercise 3 days/week.     Expected Outcomes  Patient will meet his personal goals.        Core Components/Risk Factors/Patient Goals Review:  Goals and Risk Factor Review    Row Name 04/01/18 1557 04/23/18 1459 05/20/18 1453 06/17/18 1556 07/08/18 1538     Core Components/Risk Factors/Patient Goals Review   Personal Goals Review  Weight Management/Obesity;Improve shortness of breath with ADL's;Increase knowledge of respiratory medications and ability to use respiratory devices properly.;Diabetes Breathe better; Do ADL's without SOB; get stronger; lose 10 lbs.   Weight Management/Obesity;Improve shortness of breath with ADL's;Increase knowledge of respiratory medications and ability to use respiratory devices properly.;Diabetes Breathe better; do ADL's without SOB; get stronger; lsoe 10 lbs.   Weight  Management/Obesity;Improve shortness of breath with ADL's;Increase knowledge of respiratory medications and ability to use respiratory devices properly.;Diabetes Breathe better; Do ADL's without SOB; Get stronger; Lose 10 lbs.   Weight Management/Obesity;Improve shortness of breath with ADL's;Increase knowledge of respiratory medications and ability to use respiratory devices properly.;Diabetes Breathe better; Do ADL's without SOB; get stronger; lose 10 lbs.   Weight Management/Obesity;Improve shortness of breath with ADL's;Increase knowledge of respiratory medications and ability to use respiratory devices properly.;Diabetes  Breathe better; Do ADL's without SOB; Get stronger; Lose 10 lbs.    Review  Patient has completed 8 sessions losing 2.3 lbs since his initial visit. Patient says he can not see any improvement since he started the program. He is being evaluated by a cardiologist and says a stress test has been planned. He is not sure when. He thinks maybe something going on with his heart could be contributing to his SOB. He has no recent Hgb A1C labs showing. He does not take his glucose often, so he does not always report a reading. Will continue to monitor for progress.   Patient has completed 12 sessions maintaining his weight since last 30 day review. Patient continues to say he does not see any improvement in his breathing or strength contrary to him telling our EP he is getting stronger. He feels like there is more going on than his ILD. He had a heart catheterization 04/20/18 with preliminary results looking like no blockages greater than 40% with finial results with treatment pending. Will continue to monitor for progress.     Patient has completed 15 sessions losing 6 lbs since he started the program. He has missed several sessions since last 30 day review due to cardiovascular workup which was negative. He continues to say that the program has not helped him in his breathing or strength. He is  pleased with his weight loss. Will continue to monitor for progress.   Patient has completed 22 sessions losing 2 lbs since he started the program. He is doing well in the program with progression. He says he feels stronger some days but not other days. He sees a little improvement in his breathing. He has not increased his activity a lot. Will continue to monitor for progress.   Patient has completed 27 sessions gaining 2 lbs since last 30 day review. His reported glucose readings are usually greater than 120. He says he is trying to follow a low carb diabetic diet. He continues to do well in the program with progression.  He has seen some improvement in his breathing but continues to say miminal improvement in his strength or endurance. Will continue to monitor for progress.    Expected Outcomes  Patient will continue to attend sessions and complete the program meeting his personal goals.   Patient will continue to attend sessions and complete the program meeting his personal goals.   Patient will continue to attend sessions and complete the program meeting his personal goals.   Patient will continue to attend sessions and complete the program meeting his personal goals.   Patient will continue to attend sessions and complete the program meeting his personal goals.    [place] Name 07/29/18 1541             Core Components/Risk Factors/Patient Goals Review   Personal Goals Review  Weight Management/Obesity;Improve shortness of breath with ADL's;Increase knowledge of respiratory medications and ability to use respiratory devices properly.;Diabetes Breathe better; Do ADL's without SOB; get stronger; lose 10 lbs.        Review  Patient has completed 33 sessions losing 1 lb since last 30 day review. His reported glucose readings continue to be on average greater than 130. He has done well in the program with progression. He says his strength and stamina continue to improve with some improvement in his SOB with  activity. Will continue to monitor for progress.        Expected Outcomes  Patient will continue  to attend sessions and complete the program meeting his personal goals.           Core Components/Risk Factors/Patient Goals at Discharge (Final Review):  Goals and Risk Factor Review - 07/29/18 1541      Core Components/Risk Factors/Patient Goals Review   Personal Goals Review  Weight Management/Obesity;Improve shortness of breath with ADL's;Increase knowledge of respiratory medications and ability to use respiratory devices properly.;Diabetes   Breathe better; Do ADL's without SOB; get stronger; lose 10 lbs.    Review  Patient has completed 33 sessions losing 1 lb since last 30 day review. His reported glucose readings continue to be on average greater than 130. He has done well in the program with progression. He says his strength and stamina continue to improve with some improvement in his SOB with activity. Will continue to monitor for progress.     Expected Outcomes  Patient will continue to attend sessions and complete the program meeting his personal goals.        ITP Comments: ITP Comments    Row Name 03/10/18 1457 03/11/18 1616         ITP Comments  Patient new to the program completing 2 sessions. Will continue to monitor for progress.   Patient attend the Family Matters class with hosptial chaplian to discuss how this event has impacted their life.          Comments: ITP REVIEW Pt is making expected progress toward pulmonary rehab goals after completing 33 sessions. Recommend continued exercise, life style modification, education, and utilization of breathing techniques to increase stamina and strength and decrease shortness of breath with exertion.

## 2018-08-03 ENCOUNTER — Encounter (HOSPITAL_COMMUNITY)
Admission: RE | Admit: 2018-08-03 | Discharge: 2018-08-03 | Disposition: A | Discharge status: Home / Self Care | Payer: Medicare Other | Source: Ambulatory Visit | Attending: Pulmonary Disease | Admitting: Pulmonary Disease

## 2018-08-03 DIAGNOSIS — J849 Interstitial pulmonary disease, unspecified: Secondary | ICD-10-CM

## 2018-08-03 NOTE — Progress Notes (Signed)
Daily Session Note  Patient Details  Name: Manuel Atkins MRN: [id number] Date of Birth: [birth year] Referring Provider:     PULMONARY REHAB OTHER RESP ORIENTATION from 03/03/2018 in [place]  Referring Provider  Dr. Vaughan Browner      Encounter Date: 08/03/2018  Check In: Session Check In - 08/03/18 1348      Check-In   Supervising physician immediately available to respond to emergencies  See telemetry face sheet for immediately available MD    Location  [facility]    Staff Present  Russella Dar, MS, EP, [facility], Exercise Physiologist;Eyleen Rawlinson Zachery Conch, Exercise Physiologist;Other    Medication changes reported      No    Fall or balance concerns reported     Yes    Comments  Patient has a h/o of falls and reports balance issues.     Tobacco Cessation  No Change    Warm-up and Cool-down  Performed as group-led instruction    Resistance Training Performed  Yes    VAD Patient?  No    PAD/SET Patient?  No      Pain Assessment   Currently in Pain?  No/denies    Pain Score  0-No pain    Multiple Pain Sites  No       Capillary Blood Glucose: No results found for this or any previous visit (from the past 24 hour(s)).    Social History   Tobacco Use  Smoking Status Former Smoker  . Years: 20.00  . Types: Pipe, Cigars  . Last attempt to quit: 11/10/1968  . Years since quitting: 49.7  Smokeless Tobacco Never Used    Goals Met:  Proper associated with RPD/PD & O2 Sat Independence with exercise equipment Using PLB without cueing & demonstrates good technique Exercise tolerated well No report of cardiac concerns or symptoms Strength training completed today  Goals Unmet:  Not Applicable  Comments: Pt able to follow exercise prescription today without complaint.  Will continue to monitor for progression. Check out 2:30.   Dr. Sinda Du is Medical Director for [facility] Pulmonary [facility].

## 2018-08-05 ENCOUNTER — Encounter (HOSPITAL_COMMUNITY)
Admission: RE | Admit: 2018-08-05 | Discharge: 2018-08-05 | Disposition: A | Discharge status: Home / Self Care | Payer: Medicare Other | Source: Ambulatory Visit | Attending: Pulmonary Disease | Admitting: Pulmonary Disease

## 2018-08-05 VITALS — Ht 71.0 in | Wt 210.1 lb

## 2018-08-05 DIAGNOSIS — J849 Interstitial pulmonary disease, unspecified: Secondary | ICD-10-CM | POA: Diagnosis not present

## 2018-08-05 NOTE — Progress Notes (Signed)
Daily Session Note  Patient Details  Name: GATLYN LIPARI MRN: [id number] Date of Birth: 1933/08/16 Referring Provider:     PULMONARY REHAB OTHER RESP ORIENTATION from 03/03/2018 in [place]  Referring Provider  Dr. Vaughan Browner      Encounter Date: 08/05/2018  Check In: Session Check In - 08/05/18 1330      Check-In   Supervising physician immediately available to respond to emergencies  See telemetry face sheet for immediately available MD    Location  [facility]    Staff Present  Russella Dar, MS, EP, [facility], Exercise Physiologist;Amanda Ballard, Exercise Physiologist;Loyal Holzheimer Wynetta Emery, RN, BSN    Medication changes reported      No    Fall or balance concerns reported     Yes    Comments  Patient has a h/o of falls and reports balance issues.     Warm-up and Cool-down  Performed as group-led Higher education careers adviser Performed  Yes    VAD Patient?  No    PAD/SET Patient?  No      Pain Assessment   Currently in Pain?  No/denies    Pain Score  0-No pain    Multiple Pain Sites  No       Capillary Blood Glucose: No results found for this or any previous visit (from the past 24 hour(s)).    Social History   Tobacco Use  Smoking Status Former Smoker  . Years: 20.00  . Types: Pipe, Cigars  . Last attempt to quit: 11/10/1968  . Years since quitting: 49.7  Smokeless Tobacco Never Used    Goals Met:  Proper associated with RPD/PD & O2 Sat Independence with exercise equipment Improved SOB with ADL's Using PLB without cueing & demonstrates good technique Exercise tolerated well No report of cardiac concerns or symptoms Strength training completed today  Goals Unmet:  Not Applicable  Comments: Pt able to follow exercise prescription today without complaint.  Will continue to monitor for progression. Check out 1430.   Dr. Sinda Du is Medical Director for [facility] Pulmonary Re[facility].

## 2018-08-10 ENCOUNTER — Encounter (HOSPITAL_COMMUNITY)
Admission: RE | Admit: 2018-08-10 | Discharge: 2018-08-10 | Disposition: A | Discharge status: Home / Self Care | Payer: Medicare Other | Source: Ambulatory Visit | Attending: Pulmonary Disease | Admitting: Pulmonary Disease

## 2018-08-10 DIAGNOSIS — J849 Interstitial pulmonary disease, unspecified: Secondary | ICD-10-CM | POA: Insufficient documentation

## 2018-08-10 NOTE — Progress Notes (Signed)
Daily Session Note  Patient Details  Name: Manuel Atkins MRN: [id number] Date of Birth: Sep 25, 1933 Referring Provider:     PULMONARY REHAB OTHER RESP ORIENTATION from 03/03/2018 in [place]  Referring Provider  Dr. Vaughan Browner      Encounter Date: 08/10/2018  Check In: Session Check In - 08/10/18 1330      Check-In   Supervising physician immediately available to respond to emergencies  See telemetry face sheet for immediately available MD    Location  [facility]    Staff Present  Russella Dar, MS, EP, [facility], Exercise Physiologist;Yoland Scherr Zachery Conch, Exercise Physiologist    Medication changes reported      No    Fall or balance concerns reported     Yes    Comments  Patient has a h/o of falls and reports balance issues.     Tobacco Cessation  No Change    Warm-up and Cool-down  Performed as group-led instruction    Resistance Training Performed  Yes    VAD Patient?  No    PAD/SET Patient?  No      Pain Assessment   Currently in Pain?  No/denies    Pain Score  0-No pain    Multiple Pain Sites  No       Capillary Blood Glucose: No results found for this or any previous visit (from the past 24 hour(s)).    Social History   Tobacco Use  Smoking Status Former Smoker  . Years: 20.00  . Types: Pipe, Cigars  . Last attempt to quit: 11/10/1968  . Years since quitting: 49.7  Smokeless Tobacco Never Used    Goals Met:  Proper associated with RPD/PD & O2 Sat Independence with exercise equipment Using PLB without cueing & demonstrates good technique Exercise tolerated well No report of cardiac concerns or symptoms Strength training completed today  Goals Unmet:  Not Applicable  Comments: Pt able to follow exercise prescription today without complaint.  Will continue to monitor for progression. Check out 2:30.   Dr. Sinda Du is Medical Director for [facility] Pul[facility] Re[facility].

## 2018-08-12 DIAGNOSIS — B351 Tinea unguium: Secondary | ICD-10-CM | POA: Diagnosis not present

## 2018-08-12 DIAGNOSIS — E1140 Type 2 diabetes mellitus with diabetic neuropathy, unspecified: Secondary | ICD-10-CM | POA: Diagnosis not present

## 2018-08-12 DIAGNOSIS — L110 Acquired keratosis follicularis: Secondary | ICD-10-CM | POA: Diagnosis not present

## 2018-08-12 NOTE — Progress Notes (Signed)
Pulmonary Individual Treatment Plan  Patient Details  Name: Manuel Atkins MRN: [id number] Date of Birth: 1933-09-15 Referring Provider:     PULMONARY REHAB OTHER RESP ORIENTATION from 03/03/2018 in [facility]  Referring Provider  Dr. Vaughan Browner      Initial Encounter Date:    [place] from 03/03/2018 in [facility]  Date  03/03/18      Visit Diagnosis: ILD (interstitial lung disease) ([place])  Patient's Home Medications on Admission:   Current Outpatient Medications:  .  acetaminophen (TYLENOL) 500 MG tablet, Take 1,000 mg by mouth every 6 (six) hours as needed for moderate pain or headache., Disp: , Rfl:  .  ALPRAZolam (XANAX) 0.25 MG tablet, Take 0.5 tablets (0.125 mg total) by mouth 3 (three) times daily as needed for anxiety. for anxiety, Disp: 135 tablet, Rfl: 1 .  amLODipine (NORVASC) 2.5 MG tablet, Take 2.5 mg by mouth daily., Disp: , Rfl:  .  aspirin EC 81 MG tablet, Take 81 mg by mouth daily., Disp: , Rfl:  .  imipramine (TOFRANIL) 25 MG tablet, Take 75 mg by mouth at bedtime, Disp: , Rfl: 0 .  latanoprost (XALATAN) 0.005 % ophthalmic solution, Place 1 drop into both eyes at bedtime., Disp: , Rfl: 6 .  metFORMIN (GLUCOPHAGE-XR) 500 MG 24 hr tablet, Take 1,000 mg by mouth at bedtime., Disp: , Rfl: 1 .  nitroGLYCERIN (NITROSTAT) 0.4 MG SL tablet, Place 0.4 mg under the tongue every 5 (five) minutes as needed for chest pain. , Disp: , Rfl: 1 .  pantoprazole (PROTONIX) 40 MG tablet, Take 1 tablet (40 mg total) by mouth every other day., Disp: 45 tablet, Rfl: 3 .  Probiotic Product (PROBIOTIC DAILY PO), Take 1 capsule by mouth daily. , Disp: , Rfl:   Past Medical History: Past Medical History:  Diagnosis Date  . 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 Dyspnea   . Essential hypertension, benign   . GERD (gastroesophageal reflux disease)   . Headache(784.0)   . History of colon polyps   . History of transfusion of  whole blood   . Interstitial lung disease ([place]) 12/01/2017  . Mixed hyperlipidemia   . Obstructive sleep apnea (adult) (pediatric)    uses bipap @ HS  . Other malaise and fatigue   . Thrombocytopenia ([place]) 11/10/2012  . Type II or unspecified type diabetes mellitus without mention of complication, uncontrolled   . 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 Years: 20.00  . Types: Pipe, Cigars  . Last attempt to quit: 11/10/1968  . Years since quitting: 49.7  Smokeless Tobacco Never Used    Labs: Recent Chemical engineer    Labs for ITP Cardiac and Pulmonary Rehab Latest Ref Rng & Units 11/07/2012 04/20/2018 04/20/2018   Hemoglobin A1c <5.7 % 6.2(H) - -   PHART 7.350 - 7.450 - - 7.304(L)   PCO2ART 32.0 - 48.0 mmHg - - 42.2   HCO3 20.0 - 28.0 mmol/L - 22.3 21.0   TCO2 22 - 32 mmol/L - 24 22   ACIDBASEDEF 0.0 - 2.0 mmol/L - 4.0(H) 5.0(H)   O2SAT % - 63.0 93.0      Capillary Blood Glucose: Lab Results  Component Value Date   GLUCAP 140 (H) 04/20/2018   GLUCAP 168 (H) 04/20/2018   GLUCAP 206 (H) 04/20/2018   GLUCAP 139 (H) 12/15/2017   GLUCAP  133 (H) 06/16/2013     Pulmonary Assessment Scores: Pulmonary Assessment Scores    Row Name 03/03/18 1440 08/12/18 1602       ADL UCSD   ADL Phase  Entry  Exit    SOB Score total  69  71    Rest  0  0    Walk  6  6    Stairs  4  4    Bath  4  4    Dress  4  4    Shop  3  3      CAT Score   CAT Score  18  17      mMRC Score   mMRC Score  3  2       Pulmonary Function Assessment: Pulmonary Function Assessment - 03/03/18 1452      Pulmonary Function Tests   FVC%  58 %    FEV1%  67 %    FEV1/FVC Ratio  82    DLCO%  14.81 %      Post Bronchodilator Spirometry Results   FVC%  54 %    FEV1%  66 %    FEV1/FVC Ratio  86       Exercise Target Goals: Exercise Program Goal: Individual exercise prescription set using results from  initial 6 min walk test and THRR while considering  patient's activity barriers and safety.   Exercise Prescription Goal: Initial exercise prescription builds to 30-45 minutes a day of aerobic activity, 2-3 days per week.  Home exercise guidelines will be given to patient during program as part of exercise prescription that the participant will acknowledge.  Activity Barriers & Risk Stratification: Activity Barriers & Cardiac Risk Stratification - 03/03/18 1405      Activity Barriers & Cardiac Risk Stratification   Activity Barriers  Shortness of Breath    Cardiac Risk Stratification  High       6 Minute Walk: 6 Minute Walk    Row Name 03/03/18 1404 08/05/18 1520       6 Minute Walk   Phase  Initial  Discharge    Distance  1100 feet  1200 feet    Distance % Change  0 %  9 %    Distance Feet Change  0 ft  100 ft    Walk Time  6 minutes  6 minutes    # of Rest Breaks  0  0    MPH  2.08  2.27    METS  2.59  2.74    RPE  12  11    Perceived Dyspnea   14  11    VO2 Peak  7.16  7.49    Symptoms  No  No    Resting HR  77 bpm  93 bpm    Resting BP  154/74  130/76    Resting Oxygen Saturation   98 %  95 %    Exercise Oxygen Saturation  during 6 min walk  90 %  95 %    Max Ex. HR  79 bpm  101 bpm    Max Ex. BP  160/74  146/60    2 Minute Post BP  144/74  128/68       Oxygen Initial Assessment: Oxygen Initial Assessment - 03/03/18 1402      Home Oxygen   Home Oxygen Device  None    Sleep Oxygen Prescription  BiPAP    Home Exercise Oxygen Prescription  None  Home at Rest Exercise Oxygen Prescription  None      Initial 6 min Walk   Oxygen Used  None      Program Oxygen Prescription   Program Oxygen Prescription  None      Intervention   Short Term Goals  To learn and understand importance of monitoring SPO2 with pulse oximeter and demonstrate accurate use of the pulse oximeter.;To learn and understand importance of maintaining oxygen saturations>88%;To learn and  demonstrate proper pursed lip breathing techniques or other breathing techniques.    Long  Term Goals  Verbalizes importance of monitoring SPO2 with pulse oximeter and return demonstration;Maintenance of O2 saturations>88%;Exhibits proper breathing techniques, such as pursed lip breathing or other method taught during program session       Oxygen Re-Evaluation: Oxygen Re-Evaluation    Row Name 04/01/18 1553 04/23/18 1457 05/20/18 1453 06/17/18 1553 07/08/18 1536     Program Oxygen Prescription   Program Oxygen Prescription  None  None  None  None  None     Home Oxygen   Home Oxygen Device  None  None  None  None  None   Sleep Oxygen Prescription  BiPAP  BiPAP  BiPAP  BiPAP  BiPAP   Liters per minute  0  0  0  0  0   Home Exercise Oxygen Prescription  None  None  None  None  None   Home at Rest Exercise Oxygen Prescription  None  None  None  None  None   Compliance with Home Oxygen Use  -  -  -  Yes  Yes     Goals/Expected Outcomes   Short Term Goals  To learn and understand importance of monitoring SPO2 with pulse oximeter and demonstrate accurate use of the pulse oximeter.;To learn and understand importance of maintaining oxygen saturations>88%;To learn and demonstrate proper pursed lip breathing techniques or other breathing techniques.  To learn and understand importance of monitoring SPO2 with pulse oximeter and demonstrate accurate use of the pulse oximeter.;To learn and understand importance of maintaining oxygen saturations>88%;To learn and demonstrate proper pursed lip breathing techniques or other breathing techniques.  To learn and understand importance of monitoring SPO2 with pulse oximeter and demonstrate accurate use of the pulse oximeter.;To learn and understand importance of maintaining oxygen saturations>88%;To learn and demonstrate proper pursed lip breathing techniques or other breathing techniques.  To learn and understand importance of monitoring SPO2 with pulse oximeter  and demonstrate accurate use of the pulse oximeter.;To learn and understand importance of maintaining oxygen saturations>88%;To learn and demonstrate proper pursed lip breathing techniques or other breathing techniques.  To learn and understand importance of monitoring SPO2 with pulse oximeter and demonstrate accurate use of the pulse oximeter.;To learn and understand importance of maintaining oxygen saturations>88%;To learn and demonstrate proper pursed lip breathing techniques or other breathing techniques.   Long  Term Goals  Verbalizes importance of monitoring SPO2 with pulse oximeter and return demonstration;Maintenance of O2 saturations>88%;Exhibits proper breathing techniques, such as pursed lip breathing or other method taught during program session  Verbalizes importance of monitoring SPO2 with pulse oximeter and return demonstration;Maintenance of O2 saturations>88%;Exhibits proper breathing techniques, such as pursed lip breathing or other method taught during program session  Verbalizes importance of monitoring SPO2 with pulse oximeter and return demonstration;Maintenance of O2 saturations>88%;Exhibits proper breathing techniques, such as pursed lip breathing or other method taught during program session  Verbalizes importance of monitoring SPO2 with pulse oximeter and return demonstration;Maintenance of O2 saturations>88%;Exhibits proper  breathing techniques, such as pursed lip breathing or other method taught during program session  Verbalizes importance of monitoring SPO2 with pulse oximeter and return demonstration;Maintenance of O2 saturations>88%;Exhibits proper breathing techniques, such as pursed lip breathing or other method taught during program session   Comments  Patient demonstrates proper pursed lip breathing techniques during exercise and also demonstrates proper usage of pulse oximeter. He is able to verbalize the importance of maintaining his O2 saturations >88%. Will continue to  monitor.   Patient demonstrates proper pursed lip breathing techniques during exercise and also demonstrates proper usage of pulse oximeter. He is able to verbalize the importance of maintaining his O2 saturations >88%. Will continue to monitor.   Patient demonstrates proper pursed lip breathing techniques during exercise and also demonstrates proper usage of pulse oximeter. He is able to verbalize the importance of maintaining his O2 saturations >88%. Will continue to monitor.   Patient demonstrates proper pursed lip breathing techniques during exercise and also demonstrates proper usage of pulse oximeter. He is able to verbalize the importance of maintaining his O2 saturations >88%. Will continue to monitor.   Patient demonstrates proper pursed lip breathing techniques during exercise and also demonstrates proper usage of pulse oximeter. He is able to verbalize the importance of maintaining his O2 saturations >88%. Will continue to monitor.    Goals/Expected Outcomes  Patient will continue to meet his short and long term goals.   Patient will continue to meet his short and long term goals.   Patient will continue to meet his short and long term goals.   Patient will continue to meet his short and long term goals.   Patient will continue to meet his short and long term goals.    [place] Name 07/29/18 1540             Program Oxygen Prescription   Program Oxygen Prescription  None         Home Oxygen   Home Oxygen Device  None       Sleep Oxygen Prescription  BiPAP       Liters per minute  0       Home Exercise Oxygen Prescription  None       Home at Rest Exercise Oxygen Prescription  None       Compliance with Home Oxygen Use  Yes         Goals/Expected Outcomes   Short Term Goals  To learn and understand importance of monitoring SPO2 with pulse oximeter and demonstrate accurate use of the pulse oximeter.;To learn and understand importance of maintaining oxygen saturations>88%;To learn and  demonstrate proper pursed lip breathing techniques or other breathing techniques.       Long  Term Goals  Verbalizes importance of monitoring SPO2 with pulse oximeter and return demonstration;Maintenance of O2 saturations>88%;Exhibits proper breathing techniques, such as pursed lip breathing or other method taught during program session       Comments  Patient demonstrates proper pursed lip breathing techniques during exercise and also demonstrates proper usage of pulse oximeter. He is able to verbalize the importance of maintaining his O2 saturations >88%. Will continue to monitor.        Goals/Expected Outcomes  Patient will continue to meet his short and long term goals.           Oxygen Discharge (Final Oxygen Re-Evaluation): Oxygen Re-Evaluation - 07/29/18 1540      Program Oxygen Prescription   Program Oxygen Prescription  None  Home Oxygen   Home Oxygen Device  None    Sleep Oxygen Prescription  BiPAP    Liters per minute  0    Home Exercise Oxygen Prescription  None    Home at Rest Exercise Oxygen Prescription  None    Compliance with Home Oxygen Use  Yes      Goals/Expected Outcomes   Short Term Goals  To learn and understand importance of monitoring SPO2 with pulse oximeter and demonstrate accurate use of the pulse oximeter.;To learn and understand importance of maintaining oxygen saturations>88%;To learn and demonstrate proper pursed lip breathing techniques or other breathing techniques.    Long  Term Goals  Verbalizes importance of monitoring SPO2 with pulse oximeter and return demonstration;Maintenance of O2 saturations>88%;Exhibits proper breathing techniques, such as pursed lip breathing or other method taught during program session    Comments  Patient demonstrates proper pursed lip breathing techniques during exercise and also demonstrates proper usage of pulse oximeter. He is able to verbalize the importance of maintaining his O2 saturations >88%. Will continue to  monitor.     Goals/Expected Outcomes  Patient will continue to meet his short and long term goals.        Initial Exercise Prescription: Initial Exercise Prescription - 03/03/18 1400      Date of Initial Exercise RX and Referring Provider   Date  03/03/18    Referring Provider  Dr. Vaughan Browner      Treadmill   MPH  1.3    Grade  0    Minutes  15    METs  1.9      NuStep   Level  1    SPM  74    Minutes  20    METs  1.9      Prescription Details   Frequency (times per week)  3    Duration  Progress to 30 minutes of continuous aerobic without signs/symptoms of physical distress      Intensity   THRR 40-80% of Max Heartrate  101-113-126    Ratings of Perceived Exertion  11-13    Perceived Dyspnea  0-4      Progression   Progression  Continue progressive overload as per policy without signs/symptoms or physical distress.      Resistance Training   Training Prescription  Yes    Weight  1    Reps  10-15       Perform Capillary Blood Glucose checks as needed.  Exercise Prescription Changes:  Exercise Prescription Changes    Row Name 03/11/18 1400 03/16/18 1000 03/30/18 0700 04/12/18 1400 04/28/18 1400     Response to Exercise   Blood Pressure (Admit)  -  138/68  130/70  130/60  120/68   Blood Pressure (Exercise)  -  160/70  150/74  150/68  150/62   Blood Pressure (Exit)  -  144/70  134/68  128/66  110/64   Heart Rate (Admit)  -  91 bpm  84 bpm  91 bpm  87 bpm   Heart Rate (Exercise)  -  73 bpm  99 bpm  91 bpm  80 bpm   Heart Rate (Exit)  -  82 bpm  89 bpm  91 bpm  95 bpm   Oxygen Saturation (Admit)  -  97 %  93 %  91 %  96 %   Oxygen Saturation (Exercise)  -  98 %  94 %  90 %  92 %   Oxygen Saturation (Exit)  -  91 %  90 %  97 %  93 %   Rating of Perceived Exertion (Exercise)  -  12  12  11  12    Perceived Dyspnea (Exercise)  -  12  12  11  13    Duration  -  Progress to 30 minutes of  aerobic without signs/symptoms of physical distress  Progress to 30 minutes of   aerobic without signs/symptoms of physical distress  Progress to 30 minutes of  aerobic without signs/symptoms of physical distress  Progress to 30 minutes of  aerobic without signs/symptoms of physical distress   Intensity  -  THRR New 110-119-129  THRR New 106-116-127  THRR New 110-119-129  THRR New 107-118-128     Progression   Progression  -  Continue to progress workloads to maintain intensity without signs/symptoms of physical distress.  Continue to progress workloads to maintain intensity without signs/symptoms of physical distress.  Continue to progress workloads to maintain intensity without signs/symptoms of physical distress.  Continue to progress workloads to maintain intensity without signs/symptoms of physical distress.     Resistance Training   Training Prescription  Yes  Yes  Yes  Yes  Yes   Weight  1  1  2  2  3    Reps  10-15  10-15  10-15  10-15  10-15     Treadmill   MPH  1.3  1.3  1.5  1.7  1.7   Grade  0  0  0  0  0   Minutes  15  15  15  15  15    METs  1.9  1.9  2.15  2.3  2.3     NuStep   Level  1  1  1  2  2    SPM  74  95  92  125  115   Minutes  20  20  20  20  20    METs  1.9  1.9  1.8  1.8  1.9     Home Exercise Plan   Plans to continue exercise at  Home (comment)  Home (comment)  Home (comment)  Home (comment)  Home (comment)   Frequency  Add 2 additional days to program exercise sessions.  Add 2 additional days to program exercise sessions.  Add 2 additional days to program exercise sessions.  Add 2 additional days to program exercise sessions.  Add 2 additional days to program exercise sessions.   Initial Home Exercises Provided  03/11/18  03/11/18  03/11/18  03/11/18  03/11/18   Row Name 05/11/18 0800 05/30/18 1600 06/19/18 1200 07/07/18 1900 07/16/18 1400     Response to Exercise   Blood Pressure (Admit)  128/66  120/64  138/70  142/72  128/72   Blood Pressure (Exercise)  146/68  170/78  180/70  162/68  138/68   Blood Pressure (Exit)  128/66  130/66   140/70  114/70  118/60   Heart Rate (Admit)  102 bpm  99 bpm  86 bpm  94 bpm  91 bpm   Heart Rate (Exercise)  88 bpm  101 bpm  106 bpm  102 bpm  107 bpm   Heart Rate (Exit)  97 bpm  98 bpm  87 bpm  98 bpm  100 bpm   Oxygen Saturation (Admit)  96 %  99 %  94 %  98 %  97 %   Oxygen Saturation (Exercise)  90 %  99 %  90 %  96 %  90 %   Oxygen Saturation (Exit)  90 %  97 %  94 %  98 %  95 %   Rating of Perceived Exertion (Exercise)  12  11  12  11  12    Perceived Dyspnea (Exercise)  13  11  11  12  12    Duration  Progress to 30 minutes of  aerobic without signs/symptoms of physical distress  Progress to 30 minutes of  aerobic without signs/symptoms of physical distress  Progress to 30 minutes of  aerobic without signs/symptoms of physical distress  Progress to 30 minutes of  aerobic without signs/symptoms of physical distress  Progress to 30 minutes of  aerobic without signs/symptoms of physical distress   Intensity  THRR New 116-124-131  THRR New 114-122-130  THRR New 106-117-127  THRR unchanged  THRR unchanged     Progression   Progression  Continue to progress workloads to maintain intensity without signs/symptoms of physical distress.  Continue to progress workloads to maintain intensity without signs/symptoms of physical distress.  Continue to progress workloads to maintain intensity without signs/symptoms of physical distress.  Continue to progress workloads to maintain intensity without signs/symptoms of physical distress.  Continue to progress workloads to maintain intensity without signs/symptoms of physical distress.   Average METs  -  2.1  2.16  3.12  2.24     Resistance Training   Training Prescription  Yes  Yes  Yes  Yes  Yes   Weight  3  3  4  5  5    Reps  10-15  10-15  10-15  10-15  10-15   Time  -  5 Minutes  5 Minutes  5 Minutes  5 Minutes     Treadmill   MPH  1.7  1.7  2  2.1  2.2   Grade  0  0  0  0  0   Minutes  15  15  17  17  17    METs  2.3  2.3  2.53  2.6  2.68      NuStep   Level  2  2  3  3  4    SPM  97  118  123  128  112   Minutes  20  20  22  22  22    METs  2  1.9  1.8  1.9  1.8     Home Exercise Plan   Plans to continue exercise at  Home (comment)  Home (comment)  Home (comment)  Home (comment)  Home (comment)   Frequency  Add 2 additional days to program exercise sessions.  Add 2 additional days to program exercise sessions.  Add 2 additional days to program exercise sessions.  Add 2 additional days to program exercise sessions.  Add 2 additional days to program exercise sessions.   Initial Home Exercises Provided  03/11/18  03/11/18  03/11/18  03/11/18  03/11/18      Exercise Comments:  Exercise Comments    Row Name 03/11/18 1448 03/16/18 1027 03/30/18 0748 04/12/18 1449 04/28/18 1416   Exercise Comments  Patient recieved the take home exercise plan today 03/11/2018. THR was addressed as were safety guidelines for being active when not here in PR. Patient demonstrated an understanding and was encouraged to ask any future questions as they arise.   Patient has been doing well in PR. Patient has just started the program and is handling the initial exercise progression well. Patient will be progressed in time.  Patient is doing well in PR. Patient has increased his speed on the treadmill and his weights for the warm up portion of the session have increased to 2lbs. Patient has stated to me that he feels some more strength. Patient has still only been for PR for 6 sessions. Patient will be progressed more over time to meet his goals.    Patient is doing well in PR. Patient has increased his level on the nustep machine and increased his speed on the treadmill to 1.7. Patient states that he feels more energy to do tasks around the house.   Patient has been doing well in PR, increasing his weight on the warm up portion of the sessions to 3lbs and maintaining his levels on the machines. Patient has not attended class since 04/15/2018 so no more progression has yet  been made.    [place] Name 05/11/18 0818 06/19/18 1258 07/07/18 1935 07/16/18 1459 07/30/18 0740   Exercise Comments  Patient is doing well in PR. 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 Patient will be progressed in time.   Patient is progressing at a steady rate. He is breathing better and increasing in weights and equipment workloads.   Patient is progressing at a steady rate. He is breathing better and increasing in weights and equipment workloads.   Patient is progressing at a steady rate. He is breathing better and increasing in weights and equipment workloads.   Patient is progressing at a steady rate. He is breathing better and increasing in weights and equipment workloads.       Exercise Goals and Review:  Exercise Goals    Row Name 03/03/18 1406             Exercise Goals   Increase Physical Activity  Yes       Intervention  Provide advice, education, support and counseling about physical activity/exercise needs.;Develop an individualized exercise prescription for aerobic and resistive training based on initial evaluation findings, risk stratification, comorbidities and participant's personal goals.       Expected Outcomes  Short Term: Attend rehab on a regular basis to increase amount of physical activity.       Increase Strength and Stamina  Yes       Intervention  Provide advice, education, support and counseling about physical activity/exercise needs.;Develop an individualized exercise prescription for aerobic and resistive training based on initial evaluation findings, risk stratification, comorbidities and participant's personal goals.       Expected Outcomes  Short Term: Increase workloads from initial exercise prescription for resistance, speed, and METs.       Able to understand and use rate of perceived exertion (RPE) scale  Yes       Intervention  Provide education and explanation on how to use RPE scale       Expected Outcomes   Short Term: Able to use RPE daily in rehab to express subjective intensity level;Long Term:  Able to use RPE to guide intensity level when exercising independently       Able to understand and use Dyspnea scale  Yes       Intervention  Provide education and explanation on how to use Dyspnea scale       Expected Outcomes  Short Term: Able to use Dyspnea scale daily in rehab to express subjective sense of shortness of breath during exertion;Long Term: Able to use Dyspnea scale to guide intensity level when exercising independently  Knowledge and understanding of Target Heart Rate Range (THRR)  Yes       Intervention  Provide education and explanation of THRR including how the numbers were predicted and where they are located for reference       Expected Outcomes  Short Term: Able to state/look up THRR;Long Term: Able to use THRR to govern intensity when exercising independently;Short Term: Able to use daily as guideline for intensity in rehab       Able to check pulse independently  Yes       Intervention  Provide education and demonstration on how to check pulse in carotid and radial arteries.;Review the importance of being able to check your own pulse for safety during independent exercise       Expected Outcomes  Short Term: Able to explain why pulse checking is important during independent exercise;Long Term: Able to check pulse independently and accurately       Understanding of Exercise Prescription  Yes       Intervention  Provide education, explanation, and written materials on patient's individual exercise prescription       Expected Outcomes  Short Term: Able to explain program exercise prescription;Long Term: Able to explain home exercise prescription to exercise independently          Exercise Goals Re-Evaluation : Exercise Goals Re-Evaluation    Row Name 03/30/18 0746 04/20/18 0831 05/17/18 1511 06/19/18 1248 07/07/18 1932     Exercise Goal Re-Evaluation   Exercise Goals Review   Increase Physical Activity;Increase Strength and Stamina;Able to check pulse independently;Able to understand and use rate of perceived exertion (RPE) scale;Knowledge and understanding of Target Heart Rate Range (THRR);Able to understand and use Dyspnea scale;Understanding of Exercise Prescription  Increase Physical Activity;Increase Strength and Stamina;Able to check pulse independently;Able to understand and use rate of perceived exertion (RPE) scale;Knowledge and understanding of Target Heart Rate Range (THRR);Able to understand and use Dyspnea scale;Understanding of Exercise Prescription  Increase Physical Activity;Increase Strength and Stamina;Able to check pulse independently;Able to understand and use rate of perceived exertion (RPE) scale;Knowledge and understanding of Target Heart Rate Range (THRR);Able to understand and use Dyspnea scale;Understanding of Exercise Prescription  Increase Strength and Stamina;Able to understand and use rate of perceived exertion (RPE) scale;Able to understand and use Dyspnea scale;Knowledge and understanding of Target Heart Rate Range (THRR);Understanding of Exercise Prescription  Increase Physical Activity;Increase Strength and Stamina;Able to understand and use rate of perceived exertion (RPE) scale;Able to understand and use Dyspnea scale;Understanding of Exercise Prescription   Comments  Patient is doing well in PR. Patient has increased his speed on the treadmill and his weights for the warm up portion of the session have increased to 2lbs. Patient has stated to me that he feels some more strength. Patient has still only been for PR for 6 sessions. Patient will be progressed more over time to meet his goals.    Patient has been doing very well in PR. Patient has stated to me that he feels much more energy and strength from the program. Patient has become less SOB he states. Patient has been doing well on his nustep and has been progressed to level 2 on the machine.    Patient has been doing very well in CR. Patient has just recently returned from getting several tests done that have hindered his attendance. Patient has stated to me that he has much better strength since beginning the program however he is still working on being able to breathe better and  working on breathing techniques.   Patient has been able to handle increases in weights and equipment workloads. His blood sugar readings fasting is still high. Patient feels he is breathing better. He is not sure this is making him stronger. We reassure him that he is improving based on his being able to handle increases in his workloads. Patient has lost 4lbs from start of program. Has not lost any more and is not gaining any weight either. Weight staying between 206-208lbs.   Patient has been able to handle increases in weights and equipment workloads. His blood sugar readings fasting is still high. Patient feels he is breathing better. He is not sure this is making him stronger. We reassure him that he is improving based on his being able to handle increases in his workloads. Patient has lost 4lbs from start of program. Has not lost any more and is not gaining any weight either. Weight holding his weight at 210lbs.    Expected Outcomes  Patient wishes to lose weight, breathe better, and to gain strength.   Patient wishes to lose weight, breathe better, and to gain strength.   Patient wishes to lose weight, breathe better, and to gain strength.   More DM control, breath better, lose weight and improve strength  More DM control, breath better, lose weight and improve strength   Row Name 07/16/18 1457 07/30/18 0739           Exercise Goal Re-Evaluation   Exercise Goals Review  Increase Physical Activity;Increase Strength and Stamina;Able to understand and use rate of perceived exertion (RPE) scale;Able to understand and use Dyspnea scale;Understanding of Exercise Prescription  Increase Physical Activity;Increase Strength  and Stamina;Able to understand and use rate of perceived exertion (RPE) scale;Able to understand and use Dyspnea scale;Understanding of Exercise Prescription      Comments  Patient has been able to handle increases in weights and equipment workloads. He has increased his level on the nustep to 4 and his speed on the treadmill to 2.2 mph. Patient feels he is breathing better. We reassure him that he is improving based on his being able to handle increases in his workloads.  Weight holding his weight at 210lbs.   Patient has been able to handle increases in weights and equipment workloads. He has increased his level on the nustep to 4 and his speed on the treadmill to 2.2 mph. Will increase his speed to 2.3. Patient feels he is breathing better. We reassure him that he is improving based on his being able to handle increases in his workloads.  Weight holding his weight at 210lbs.       Expected Outcomes  More DM control, breath better, lose weight and improve strength  More DM control, breath better, lose weight and improve strength         Discharge Exercise Prescription (Final Exercise Prescription Changes): Exercise Prescription Changes - 07/16/18 1400      Response to Exercise   Blood Pressure (Admit)  128/72    Blood Pressure (Exercise)  138/68    Blood Pressure (Exit)  118/60    Heart Rate (Admit)  91 bpm    Heart Rate (Exercise)  107 bpm    Heart Rate (Exit)  100 bpm    Oxygen Saturation (Admit)  97 %    Oxygen Saturation (Exercise)  90 %    Oxygen Saturation (Exit)  95 %    Rating of Perceived Exertion (Exercise)  12    Perceived Dyspnea (Exercise)  12    Duration  Progress to 30 minutes of  aerobic without signs/symptoms of physical distress    Intensity  THRR unchanged      Progression   Progression  Continue to progress workloads to maintain intensity without signs/symptoms of physical distress.    Average METs  2.24      Resistance Training   Training Prescription  Yes     Weight  5    Reps  10-15    Time  5 Minutes      Treadmill   MPH  2.2    Grade  0    Minutes  17    METs  2.68      NuStep   Level  4    SPM  112    Minutes  22    METs  1.8      Home Exercise Plan   Plans to continue exercise at  Home (comment)    Frequency  Add 2 additional days to program exercise sessions.    Initial Home Exercises Provided  03/11/18       Nutrition:  Target Goals: Understanding of nutrition guidelines, daily intake of sodium <1514m, cholesterol <2070m calories 30% from fat and 7% or less from saturated fats, daily to have 5 or more servings of fruits and vegetables.  Biometrics: Pre Biometrics - 03/03/18 1406      Pre Biometrics   Height  5' 11"  (1.803 m)    Weight  95.3 kg    Waist Circumference  42 inches    Hip Circumference  43 inches    Waist to Hip Ratio  0.98 %    BMI (Calculated)  29.3    Triceps Skinfold  9 mm    % Body Fat  27.2 %    Grip Strength  62.29 kg    Flexibility  0 in    Single Leg Stand  2 seconds      Post Biometrics - 08/05/18 1522       Post  Biometrics   Height  5' 11"  (1.803 m)    Weight  95.3 kg    Waist Circumference  42 inches    Hip Circumference  44 inches    Waist to Hip Ratio  0.95 %    BMI (Calculated)  29.32    Triceps Skinfold  4 mm    % Body Fat  24 %    Grip Strength  58.16 kg    Flexibility  0 in    Single Leg Stand  3.04 seconds       Nutrition Therapy Plan and Nutrition Goals: Nutrition Therapy & Goals - 03/11/18 1616      Personal Nutrition Goals   Nutrition Goal  For heart healthy choices add >50% of whole grains, make half their plate fruits and vegetables. Discuss the difference between starchy vegetables and leafy greens, and how leafy vegetables provide fiber, helps maintain healthy weight, helps control blood glucose, and lowers cholesterol.  Discuss purchasing fresh or frozen vegetable to reduce sodium and not to add grease, fat or sugar. Consume <18oz of red meat per week. Consume  lean cuts of meats and very little of meats high in sodium and nitrates such as pork and lunch meats. Discussed portion control for all food groups.     Additional Goals?  No      Intervention Plan   Intervention  Nutrition handout(s) given to patient.    Expected Outcomes  Short Term Goal:  Understand basic principles of dietary content, such as calories, fat, sodium, cholesterol and nutrients.       Nutrition Assessments: Nutrition Assessments - 08/12/18 1604      MEDFICTS Scores   Pre Score  36    Post Score  56    Score Difference  20       Nutrition Goals Re-Evaluation: Nutrition Goals Re-Evaluation    Row Name 04/01/18 1555 04/23/18 1458 06/17/18 1553 07/08/18 1537 07/29/18 1541     Goals   Current Weight  207 lb 11.2 oz (94.2 kg)  207 lb 12.8 oz (94.3 kg)  208 lb (94.3 kg)  210 lb (95.3 kg)  209 lb (94.8 kg)   Nutrition Goal  For heart healthy choices add >50% of whole grains, make half their plate fruits and vegetables. Discuss the difference between starchy vegetables and leafy greens, and how leafy vegetables provide fiber, helps maintain healthy weight, helps control blood glucose, and lowers cholesterol.  Discuss purchasing fresh or frozen vegetable to reduce sodium and not to add grease, fat or sugar. Consume <18oz of red meat per week. Consume lean cuts of meats and very little of meats high in sodium and nitrates such as pork and lunch meats. Discussed portion control for all food groups.   For heart healthy choices add >50% of whole grains, make half their plate fruits and vegetables. Discuss the difference between starchy vegetables and leafy greens, and how leafy vegetables provide fiber, helps maintain healthy weight, helps control blood glucose, and lowers cholesterol.  Discuss purchasing fresh or frozen vegetable to reduce sodium and not to add grease, fat or sugar. Consume <18oz of red meat per week. Consume lean cuts of meats and very little of meats high in sodium and  nitrates such as pork and lunch meats. Discussed portion control for all food groups.   For heart healthy choices add >50% of whole grains, make half their plate fruits and vegetables. Discuss the difference between starchy vegetables and leafy greens, and how leafy vegetables provide fiber, helps maintain healthy weight, helps control blood glucose, and lowers cholesterol.  Discuss purchasing fresh or frozen vegetable to reduce sodium and not to add grease, fat or sugar. Consume <18oz of red meat per week. Consume lean cuts of meats and very little of meats high in sodium and nitrates such as pork and lunch meats. Discussed portion control for all food groups.   For heart healthy choices add >50% of whole grains, make half their plate fruits and vegetables. Discuss the difference between starchy vegetables and leafy greens, and how leafy vegetables provide fiber, helps maintain healthy weight, helps control blood glucose, and lowers cholesterol.  Discuss purchasing fresh or frozen vegetable to reduce sodium and not to add grease, fat or sugar. Consume <18oz of red meat per week. Consume lean cuts of meats and very little of meats high in sodium and nitrates such as pork and lunch meats. Discussed portion control for all food groups.   For heart healthy choices add >50% of whole grains, make half their plate fruits and vegetables. Discuss the difference between starchy vegetables and leafy greens, and how leafy vegetables provide fiber, helps maintain healthy weight, helps control blood glucose, and lowers cholesterol.  Discuss purchasing fresh or frozen vegetable to reduce sodium and not to add grease, fat or sugar. Consume <18oz of red meat per week. Consume lean cuts of meats and very little of meats high in sodium and nitrates such as pork and lunch  meats. Discussed portion control for all food groups.    Comment  Patient has lost 2.3 lbs since last 30 day review. He continues to say he is following a heart  healthy diet. Will continue to monitor for progress.   Patient has maintained his weight since last 30 day review. He continues to say he is following a heart healthy diet. Will continue to monitor for progress.   Patient has gained 1 lb since last 30 day review. She says he is eating heart healthy. Will continue to monitor for progress.   Patient has gained 2 lbs since last 30 day review. He continues to say he is eating heart healthy. Will continue to monitor for progress.   Patient has lost 1 lb since last 30 day review. He continues to say he is eating heart healthy. Will continue to monitor for progress.    Expected Outcome  Patient will continue to meet his nutrition goals.   Patient will continue to meet his nutrition goals.   Patient will continue to meet his nutrition goals.   Patient will continue to meet his nutrition goals.   Patient will continue to meet his nutrition goals.       Nutrition Goals Discharge (Final Nutrition Goals Re-Evaluation): Nutrition Goals Re-Evaluation - 07/29/18 1541      Goals   Current Weight  209 lb (94.8 kg)    Nutrition Goal  For heart healthy choices add >50% of whole grains, make half their plate fruits and vegetables. Discuss the difference between starchy vegetables and leafy greens, and how leafy vegetables provide fiber, helps maintain healthy weight, helps control blood glucose, and lowers cholesterol.  Discuss purchasing fresh or frozen vegetable to reduce sodium and not to add grease, fat or sugar. Consume <18oz of red meat per week. Consume lean cuts of meats and very little of meats high in sodium and nitrates such as pork and lunch meats. Discussed portion control for all food groups.     Comment  Patient has lost 1 lb since last 30 day review. He continues to say he is eating heart healthy. Will continue to monitor for progress.     Expected Outcome  Patient will continue to meet his nutrition goals.        Psychosocial: Target Goals: Acknowledge  presence or absence of significant depression and/or stress, maximize coping skills, provide positive support system. Participant is able to verbalize types and ability to use techniques and skills needed for reducing stress and depression.  Initial Review & Psychosocial Screening: Initial Psych Review & Screening - 03/03/18 1445      Initial Review   Current issues with  None Identified      Family Dynamics   Good Support System?  Yes    Comments  Patient initial QOL score was 27.44 and his PHQ-9 score was 7 with no psychosocial issues identified.       Barriers   Psychosocial barriers to participate in program  There are no identifiable barriers or psychosocial needs.      Screening Interventions   Interventions  Encouraged to exercise    Expected Outcomes  Short Term goal: Identification and review with participant of any Quality of Life or Depression concerns found by scoring the questionnaire.;Long Term goal: The participant improves quality of Life and PHQ9 Scores as seen by post scores and/or verbalization of changes       Quality of Life Scores: Quality of Life - 08/11/18 1407  Quality of Life   Select  Quality of Life      Quality of Life Scores   Health/Function Pre  24.88 %    Health/Function Post  22.03 %    Health/Function % Change  -11.45 %    Socioeconomic Pre  29.58 %    Socioeconomic Post  25.75 %    Socioeconomic % Change   -12.95 %    Psych/Spiritual Pre  29.64 %    Psych/Spiritual Post  24 %    Psych/Spiritual % Change  -19.03 %    Family Pre  30 %    Family Post  25.2 %    Family % Change  -16 %    GLOBAL Pre  27.44 %    GLOBAL Post  23.61 %    GLOBAL % Change  -13.96 %      Scores of 19 and below usually indicate a poorer quality of life in these areas.  A difference of  2-3 points is a clinically meaningful difference.  A difference of 2-3 points in the total score of the Quality of Life Index has been associated with significant improvement in  overall quality of life, self-image, physical symptoms, and general health in studies assessing change in quality of life.   PHQ-9: Recent Review Flowsheet Data    Depression screen [facility] 2/9 08/12/2018 03/03/2018   Decreased Interest 1 0   Down, Depressed, Hopeless 0 0   PHQ - 2 Score 1 0   Altered sleeping 0 0   Tired, decreased energy 2 3   Change in appetite 0 3   Feeling bad or failure about yourself  0 0   Trouble concentrating 1 1   Moving slowly or fidgety/restless 0 0   Suicidal thoughts 0 0   PHQ-9 Score 4 7   Difficult doing work/chores - Not difficult at all     Interpretation of Total Score  Total Score Depression Severity:  1-4 = Minimal depression, 5-9 = Mild depression, 10-14 = Moderate depression, 15-19 = Moderately severe depression, 20-27 = Severe depression   Psychosocial Evaluation and Intervention: Psychosocial Evaluation - 08/12/18 1616      Discharge Psychosocial Assessment & Intervention   Comments  Patient has no psychosocial issues identified at discharge. His exit QOL score decreased by 13.96% at 23.61% but his PHQ-9 score 7 to 4. His CAT score decreased by 1 18 to 17.       Psychosocial Re-Evaluation: Psychosocial Re-Evaluation    [place] Name 04/01/18 1601 04/23/18 1512 05/20/18 1458 06/17/18 1559 07/29/18 1545     Psychosocial Re-Evaluation   Current issues with  None Identified  None Identified  None Identified  None Identified  None Identified   Comments  Patient's initial QOL score as 27.44 and his PHQ-9 score was 7 with no psychosocial issues identified.   Patient's initial QOL score as 27.44 and his PHQ-9 score was 7 with no psychosocial issues identified.   Patient's initial QOL score as 27.44 and his PHQ-9 score was 7 with no psychosocial issues identified.   Patient's initial QOL score as 27.44 and his PHQ-9 score was 7 with no psychosocial issues identified.   Patient's initial QOL score as 27.44 and his PHQ-9 score was 7 with no psychosocial  issues identified.    Expected Outcomes  Patient will have no psychosocial issues identified at discharge.   Patient will have no psychosocial issues identified at discharge.   Patient will have no psychosocial issues identified at  discharge.   Patient will have no psychosocial issues identified at discharge.   Patient will have no psychosocial issues identified at discharge.    Interventions  Relaxation education;Stress management education;Encouraged to attend Pulmonary Rehabilitation for the exercise  Relaxation education;Stress management education;Encouraged to attend Pulmonary Rehabilitation for the exercise  Relaxation education;Stress management education;Encouraged to attend Pulmonary Rehabilitation for the exercise  Relaxation education;Stress management education;Encouraged to attend Pulmonary Rehabilitation for the exercise  Relaxation education;Stress management education;Encouraged to attend Pulmonary Rehabilitation for the exercise   Continue Psychosocial Services   No Follow up required  No Follow up required  No Follow up required  No Follow up required  No Follow up required      Psychosocial Discharge (Final Psychosocial Re-Evaluation): Psychosocial Re-Evaluation - 07/29/18 1545      Psychosocial Re-Evaluation   Current issues with  None Identified    Comments  Patient's initial QOL score as 27.44 and his PHQ-9 score was 7 with no psychosocial issues identified.     Expected Outcomes  Patient will have no psychosocial issues identified at discharge.     Interventions  Relaxation education;Stress management education;Encouraged to attend Pulmonary Rehabilitation for the exercise    Continue Psychosocial Services   No Follow up required        Education: Education Goals: Education classes will be provided on a weekly basis, covering required topics. Participant will state understanding/return demonstration of topics presented.  Learning Barriers/Preferences: Learning  Barriers/Preferences - 03/03/18 1439      Learning Barriers/Preferences   Learning Barriers  None    Learning Preferences  Skilled Demonstration;Written Material       Education Topics: How Lungs Work and Diseases: - Discuss the anatomy of the lungs and diseases that can affect the lungs, such as COPD.   PULMONARY REHAB OTHER RESPIRATORY from 07/29/2018 in [place]  Date  03/17/18  Educator  D. Coad  Instruction Review Code  2- Demonstrated Understanding      Exercise: -Discuss the importance of exercise, FITT principles of exercise, normal and abnormal responses to exercise, and how to exercise safely.   PULMONARY REHAB OTHER RESPIRATORY from 07/29/2018 in [place]  Date  03/11/18  Educator  [place]  Instruction Review Code  2- Demonstrated Understanding      Environmental Irritants: -Discuss types of environmental irritants and how to limit exposure to environmental irritants.   PULMONARY REHAB OTHER RESPIRATORY from 07/29/2018 in [place]  Date  03/25/18  Educator  [place]  Instruction Review Code  2- Demonstrated Understanding      Meds/Inhalers and oxygen: - Discuss respiratory medications, definition of an inhaler and oxygen, and the proper way to use an inhaler and oxygen.   PULMONARY REHAB OTHER RESPIRATORY from 07/29/2018 in [place]  Date  04/01/18  Educator  DC      Energy Saving Techniques: - Discuss methods to conserve energy and decrease shortness of breath when performing activities of daily living.    PULMONARY REHAB OTHER RESPIRATORY from 07/29/2018 in [place]  Date  07/08/18  Educator  Etheleen Mayhew  Instruction Review Code  2- Demonstrated Understanding      Bronchial Hygiene / Breathing Techniques: - Discuss breathing mechanics, pursed-lip breathing technique,  proper posture, effective ways to clear airways, and other functional  breathing techniques   PULMONARY REHAB OTHER RESPIRATORY from 07/29/2018 in [place]  Date  04/15/18  Educator  DC  Instruction Review  Code  2- Demonstrated Understanding      Cleaning Equipment: - Provides group verbal and written instruction about the health risks of elevated stress, cause of high stress, and healthy ways to reduce stress.   PULMONARY REHAB OTHER RESPIRATORY from 07/29/2018 in [place]  Date  07/22/18  Educator  D. Coad   Instruction Review Code  2- Demonstrated Understanding      Nutrition I: Fats: - Discuss the types of cholesterol, what cholesterol does to the body, and how cholesterol levels can be controlled.   PULMONARY REHAB OTHER RESPIRATORY from 07/29/2018 in [place]  Date  07/29/18  Educator  Etheleen Mayhew  Instruction Review Code  2- Demonstrated Understanding      Nutrition II: Labels: -Discuss the different components of food labels and how to read food labels.   PULMONARY REHAB OTHER RESPIRATORY from 07/29/2018 in [place]  Date  05/06/18  Educator  D. Coad  Instruction Review Code  2- Demonstrated Understanding      Respiratory Infections: - Discuss the signs and symptoms of respiratory infections, ways to prevent respiratory infections, and the importance of seeking medical treatment when having a respiratory infection.   Stress I: Signs and Symptoms: - Discuss the causes of stress, how stress may lead to anxiety and depression, and ways to limit stress.   Stress II: Relaxation: -Discuss relaxation techniques to limit stress.   PULMONARY REHAB OTHER RESPIRATORY from 07/29/2018 in [place]  Date  05/27/18  Educator  DC  Instruction Review Code  2- Demonstrated Understanding      Oxygen for Home/Travel: - Discuss how to prepare for travel when on oxygen and proper ways to transport and store oxygen to ensure  safety.   PULMONARY REHAB OTHER RESPIRATORY from 07/29/2018 in [place]  Date  06/02/18  Educator  Etheleen Mayhew  Instruction Review Code  2- Demonstrated Understanding      Knowledge Questionnaire Score: Knowledge Questionnaire Score - 08/12/18 1604      Knowledge Questionnaire Score   Pre Score  12/18    Post Score  15/18       Core Components/Risk Factors/Patient Goals at Admission: Personal Goals and Risk Factors at Admission - 03/03/18 1443      Core Components/Risk Factors/Patient Goals on Admission    Weight Management  Yes    Admit Weight  210 lb (95.3 kg)    Goal Weight: Short Term  205 lb (93 kg)    Goal Weight: Long Term  200 lb (90.7 kg)    Expected Outcomes  Short Term: Continue to assess and modify interventions until short term weight is achieved;Long Term: Adherence to nutrition and physical activity/exercise program aimed toward attainment of established weight goal    Improve shortness of breath with ADL's  Yes    Intervention  Provide education, individualized exercise plan and daily activity instruction to help decrease symptoms of SOB with activities of daily living.    Expected Outcomes  Short Term: Improve cardiorespiratory fitness to achieve a reduction of symptoms when performing ADLs;Long Term: Be able to perform more ADLs without symptoms or delay the onset of symptoms    Diabetes  Yes    Intervention  Provide education about signs/symptoms and action to take for hypo/hyperglycemia.;Provide education about proper nutrition, including hydration, and aerobic/resistive exercise prescription along with prescribed medications to achieve blood glucose in normal ranges: Fasting glucose 65-99 mg/dL    Expected Outcomes  Long Term: Attainment of HbA1C < 7%.    Personal Goal Other  Yes    Personal Goal  Improve DM control; breathe better; increase strength.     Intervention  Patient will attend PR 2 days/week and supplement with exercise 3  days/week.     Expected Outcomes  Patient will meet his personal goals.        Core Components/Risk Factors/Patient Goals Review:  Goals and Risk Factor Review    Row Name 04/01/18 1557 04/23/18 1459 05/20/18 1453 06/17/18 1556 07/08/18 1538     Core Components/Risk Factors/Patient Goals Review   Personal Goals Review  Weight Management/Obesity;Improve shortness of breath with ADL's;Increase knowledge of respiratory medications and ability to use respiratory devices properly.;Diabetes Breathe better; Do ADL's without SOB; get stronger; lose 10 lbs.   Weight Management/Obesity;Improve shortness of breath with ADL's;Increase knowledge of respiratory medications and ability to use respiratory devices properly.;Diabetes Breathe better; do ADL's without SOB; get stronger; lsoe 10 lbs.   Weight Management/Obesity;Improve shortness of breath with ADL's;Increase knowledge of respiratory medications and ability to use respiratory devices properly.;Diabetes Breathe better; Do ADL's without SOB; Get stronger; Lose 10 lbs.   Weight Management/Obesity;Improve shortness of breath with ADL's;Increase knowledge of respiratory medications and ability to use respiratory devices properly.;Diabetes Breathe better; Do ADL's without SOB; get stronger; lose 10 lbs.   Weight Management/Obesity;Improve shortness of breath with ADL's;Increase knowledge of respiratory medications and ability to use respiratory devices properly.;Diabetes Breathe better; Do ADL's without SOB; Get stronger; Lose 10 lbs.    Review  Patient has completed 8 sessions losing 2.3 lbs since his initial visit. Patient says he can not see any improvement since he started the program. He is being evaluated by a cardiologist and says a stress test has been planned. He is not sure when. He thinks maybe something going on with his heart could be contributing to his SOB. He has no recent Hgb A1C labs showing. He does not take his glucose often, so he does not  always report a reading. Will continue to monitor for progress.   Patient has completed 12 sessions maintaining his weight since last 30 day review. Patient continues to say he does not see any improvement in his breathing or strength contrary to him telling our EP he is getting stronger. He feels like there is more going on than his ILD. He had a heart catheterization 04/20/18 with preliminary results looking like no blockages greater than 40% with finial results with treatment pending. Will continue to monitor for progress.     Patient has completed 15 sessions losing 6 lbs since he started the program. He has missed several sessions since last 30 day review due to cardiovascular workup which was negative. He continues to say that the program has not helped him in his breathing or strength. He is pleased with his weight loss. Will continue to monitor for progress.   Patient has completed 22 sessions losing 2 lbs since he started the program. He is doing well in the program with progression. He says he feels stronger some days but not other days. He sees a little improvement in his breathing. He has not increased his activity a lot. Will continue to monitor for progress.   Patient has completed 27 sessions gaining 2 lbs since last 30 day review. His reported glucose readings are usually greater than 120. He says he is trying to follow a low carb diabetic diet. He continues to do well in the program with  progression.  He has seen some improvement in his breathing but continues to say miminal improvement in his strength or endurance. Will continue to monitor for progress.    Expected Outcomes  Patient will continue to attend sessions and complete the program meeting his personal goals.   Patient will continue to attend sessions and complete the program meeting his personal goals.   Patient will continue to attend sessions and complete the program meeting his personal goals.   Patient will continue to attend sessions  and complete the program meeting his personal goals.   Patient will continue to attend sessions and complete the program meeting his personal goals.    [place] Name 07/29/18 1541 08/12/18 1607           Core Components/Risk Factors/Patient Goals Review   Personal Goals Review  Weight Management/Obesity;Improve shortness of breath with ADL's;Increase knowledge of respiratory medications and ability to use respiratory devices properly.;Diabetes Breathe better; Do ADL's without SOB; get stronger; lose 10 lbs.   Weight Management/Obesity;Improve shortness of breath with ADL's;Increase knowledge of respiratory medications and ability to use respiratory devices properly.;Diabetes DM controll; breathe better; increase strength.       Review  Patient has completed 33 sessions losing 1 lb since last 30 day review. His reported glucose readings continue to be on average greater than 130. He has done well in the program with progression. He says his strength and stamina continue to improve with some improvement in his SOB with activity. Will continue to monitor for progress.   Patient completed the program with 36 sessions losing one lb overall. He did well overall with exit measurements improving in balance and his exit walk test improved by 9%. His pulmonary assessment scores did not improve but was essentially the same. He tolerated all progressions but says he does not feel better since he started. His reports his breathing also did not improve. He feels like there are other issues going on that has not been diagnosed yet. He is working with his cardiologist and pulmonologist and has already done several diagnostics testing that were all unremarkable. He says the program did help him get up and doing something and helped him establish an exercise program. He plans to continue exercising at home by walking. PR will f/u for one year.       Expected Outcomes  Patient will continue to attend sessions and complete the  program meeting his personal goals.   Patient will continue to exercise at home by walking and continue work toward meeting his personal goals.          Core Components/Risk Factors/Patient Goals at Discharge (Final Review):  Goals and Risk Factor Review - 08/12/18 1607      Core Components/Risk Factors/Patient Goals Review   Personal Goals Review  Weight Management/Obesity;Improve shortness of breath with ADL's;Increase knowledge of respiratory medications and ability to use respiratory devices properly.;Diabetes   DM controll; breathe better; increase strength.    Review  Patient completed the program with 36 sessions losing one lb overall. He did well overall with exit measurements improving in balance and his exit walk test improved by 9%. His pulmonary assessment scores did not improve but was essentially the same. He tolerated all progressions but says he does not feel better since he started. His reports his breathing also did not improve. He feels like there are other issues going on that has not been diagnosed yet. He is working with his cardiologist and pulmonologist and has already  done several diagnostics testing that were all unremarkable. He says the program did help him get up and doing something and helped him establish an exercise program. He plans to continue exercising at home by walking. PR will f/u for one year.     Expected Outcomes  Patient will continue to exercise at home by walking and continue work toward meeting his personal goals.        ITP Comments: ITP Comments    Row Name 03/10/18 1457 03/11/18 1616         ITP Comments  Patient new to the program completing 2 sessions. Will continue to monitor for progress.   Patient attend the Family Matters class with hosptial chaplian to discuss how this event has impacted their life.          Comments: Patient graduated from Pulmonary Rehabilitation today on 08/10/2018 after completing 36 sessions. He achieved LTG of 30  minutes of aerobic exercise at Max Met level of 2.76. All patients vitals are WNL. Patient has met with dietician. Discharge instruction has been reviewed in detail and patient stated an understanding of material given. Patient plans to continue exercising at home on by walking. Pulmonary Rehab staff will make f/u calls at 1 month, 6 months, and 1 year. Patient had no complaints of any abnormal S/S or pain on their exit visit.

## 2018-08-12 NOTE — Progress Notes (Signed)
Discharge Progress Report  Patient Details  Name: Manuel Atkins MRN: [id number] Date of Birth: 10/29/1933 Referring Provider:     PULMONARY REHAB OTHER RESP ORIENTATION from 03/03/2018 in [place]  Referring Provider  Dr. Vaughan Browner       Number of Visits: 36  Reason for Discharge:  Patient reached a stable level of exercise. Patient independent in their exercise. Patient has met program and personal goals.  Smoking History:  Social History   Tobacco Use  Smoking Status Former Smoker  . Years: 20.00  . Types: Pipe, Cigars  . Last attempt to quit: 11/10/1968  . Years since quitting: 49.7  Smokeless Tobacco Never Used    Diagnosis:  ILD (interstitial lung disease) ([place])  ADL UCSD: Pulmonary Assessment Scores    Row Name 03/03/18 1440 08/12/18 1602       ADL UCSD   ADL Phase  Entry  Exit    SOB Score total  69  71    Rest  0  0    Walk  6  6    Stairs  4  4    Bath  4  4    Dress  4  4    Shop  3  3      CAT Score   CAT Score  18  17      mMRC Score   mMRC Score  3  2       Initial Exercise Prescription: Initial Exercise Prescription - 03/03/18 1400      Date of Initial Exercise RX and Referring Provider   Date  03/03/18    Referring Provider  Dr. Vaughan Browner      Treadmill   MPH  1.3    Grade  0    Minutes  15    METs  1.9      NuStep   Level  1    SPM  74    Minutes  20    METs  1.9      Prescription Details   Frequency (times per week)  3    Duration  Progress to 30 minutes of continuous aerobic without signs/symptoms of physical distress      Intensity   THRR 40-80% of Max Heartrate  101-113-126    Ratings of Perceived Exertion  11-13    Perceived Dyspnea  0-4      Progression   Progression  Continue progressive overload as per policy without signs/symptoms or physical distress.      Resistance Training   Training Prescription  Yes    Weight  1    Reps  10-15       Discharge Exercise Prescription (Final  Exercise Prescription Changes): Exercise Prescription Changes - 07/16/18 1400      Response to Exercise   Blood Pressure (Admit)  128/72    Blood Pressure (Exercise)  138/68    Blood Pressure (Exit)  118/60    Heart Rate (Admit)  91 bpm    Heart Rate (Exercise)  107 bpm    Heart Rate (Exit)  100 bpm    Oxygen Saturation (Admit)  97 %    Oxygen Saturation (Exercise)  90 %    Oxygen Saturation (Exit)  95 %    Rating of Perceived Exertion (Exercise)  12    Perceived Dyspnea (Exercise)  12    Duration  Progress to 30 minutes of  aerobic without signs/symptoms of physical distress    Intensity  THRR  unchanged      Progression   Progression  Continue to progress workloads to maintain intensity without signs/symptoms of physical distress.    Average METs  2.24      Resistance Training   Training Prescription  Yes    Weight  5    Reps  10-15    Time  5 Minutes      Treadmill   MPH  2.2    Grade  0    Minutes  17    METs  2.68      NuStep   Level  4    SPM  112    Minutes  22    METs  1.8      Home Exercise Plan   Plans to continue exercise at  Home (comment)    Frequency  Add 2 additional days to program exercise sessions.    Initial Home Exercises Provided  03/11/18       Functional Capacity: 6 Minute Walk    Row Name 03/03/18 1404 08/05/18 1520       6 Minute Walk   Phase  Initial  Discharge    Distance  1100 feet  1200 feet    Distance % Change  0 %  9 %    Distance Feet Change  0 ft  100 ft    Walk Time  6 minutes  6 minutes    # of Rest Breaks  0  0    MPH  2.08  2.27    METS  2.59  2.74    RPE  12  11    Perceived Dyspnea   14  11    VO2 Peak  7.16  7.49    Symptoms  No  No    Resting HR  77 bpm  93 bpm    Resting BP  154/74  130/76    Resting Oxygen Saturation   98 %  95 %    Exercise Oxygen Saturation  during 6 min walk  90 %  95 %    Max Ex. HR  79 bpm  101 bpm    Max Ex. BP  160/74  146/60    2 Minute Post BP  144/74  128/68        Psychological, QOL, Others - Outcomes: PHQ 2/9: Depression screen [facility] 2/9 08/12/2018 03/03/2018  Decreased Interest 1 0  Down, Depressed, Hopeless 0 0  PHQ - 2 Score 1 0  Altered sleeping 0 0  Tired, decreased energy 2 3  Change in appetite 0 3  Feeling bad or failure about yourself  0 0  Trouble concentrating 1 1  Moving slowly or fidgety/restless 0 0  Suicidal thoughts 0 0  PHQ-9 Score 4 7  Difficult doing work/chores - Not difficult at all    Quality of Life: Quality of Life - 08/11/18 1407      Quality of Life   Select  Quality of Life      Quality of Life Scores   Health/Function Pre  24.88 %    Health/Function Post  22.03 %    Health/Function % Change  -11.45 %    Socioeconomic Pre  29.58 %    Socioeconomic Post  25.75 %    Socioeconomic % Change   -12.95 %    Psych/Spiritual Pre  29.64 %    Psych/Spiritual Post  24 %    Psych/Spiritual % Change  -19.03 %    Family Pre  30 %  Family Post  25.2 %    Family % Change  -16 %    GLOBAL Pre  27.44 %    GLOBAL Post  23.61 %    GLOBAL % Change  -13.96 %       Personal Goals: Goals established at orientation with interventions provided to work toward goal. Personal Goals and Risk Factors at Admission - 03/03/18 1443      Core Components/Risk Factors/Patient Goals on Admission    Weight Management  Yes    Admit Weight  210 lb (95.3 kg)    Goal Weight: Short Term  205 lb (93 kg)    Goal Weight: Long Term  200 lb (90.7 kg)    Expected Outcomes  Short Term: Continue to assess and modify interventions until short term weight is achieved;Long Term: Adherence to nutrition and physical activity/exercise program aimed toward attainment of established weight goal    Improve shortness of breath with ADL's  Yes    Intervention  Provide education, individualized exercise plan and daily activity instruction to help decrease symptoms of SOB with activities of daily living.    Expected Outcomes  Short Term: Improve  cardiorespiratory fitness to achieve a reduction of symptoms when performing ADLs;Long Term: Be able to perform more ADLs without symptoms or delay the onset of symptoms    Diabetes  Yes    Intervention  Provide education about signs/symptoms and action to take for hypo/hyperglycemia.;Provide education about proper nutrition, including hydration, and aerobic/resistive exercise prescription along with prescribed medications to achieve blood glucose in normal ranges: Fasting glucose 65-99 mg/dL    Expected Outcomes  Long Term: Attainment of HbA1C < 7%.    Personal Goal Other  Yes    Personal Goal  Improve DM control; breathe better; increase strength.     Intervention  Patient will attend PR 2 days/week and supplement with exercise 3 days/week.     Expected Outcomes  Patient will meet his personal goals.         Personal Goals Discharge: Goals and Risk Factor Review    Row Name 04/01/18 1557 04/23/18 1459 05/20/18 1453 06/17/18 1556 07/08/18 1538     Core Components/Risk Factors/Patient Goals Review   Personal Goals Review  Weight Management/Obesity;Improve shortness of breath with ADL's;Increase knowledge of respiratory medications and ability to use respiratory devices properly.;Diabetes Breathe better; Do ADL's without SOB; get stronger; lose 10 lbs.   Weight Management/Obesity;Improve shortness of breath with ADL's;Increase knowledge of respiratory medications and ability to use respiratory devices properly.;Diabetes Breathe better; do ADL's without SOB; get stronger; lsoe 10 lbs.   Weight Management/Obesity;Improve shortness of breath with ADL's;Increase knowledge of respiratory medications and ability to use respiratory devices properly.;Diabetes Breathe better; Do ADL's without SOB; Get stronger; Lose 10 lbs.   Weight Management/Obesity;Improve shortness of breath with ADL's;Increase knowledge of respiratory medications and ability to use respiratory devices properly.;Diabetes Breathe better; Do  ADL's without SOB; get stronger; lose 10 lbs.   Weight Management/Obesity;Improve shortness of breath with ADL's;Increase knowledge of respiratory medications and ability to use respiratory devices properly.;Diabetes Breathe better; Do ADL's without SOB; Get stronger; Lose 10 lbs.    Review  Patient has completed 8 sessions losing 2.3 lbs since his initial visit. Patient says he can not see any improvement since he started the program. He is being evaluated by a cardiologist and says a stress test has been planned. He is not sure when. He thinks maybe something going on with his heart could be contributing  to his SOB. He has no recent Hgb A1C labs showing. He does not take his glucose often, so he does not always report a reading. Will continue to monitor for progress.   Patient has completed 12 sessions maintaining his weight since last 30 day review. Patient continues to say he does not see any improvement in his breathing or strength contrary to him telling our EP he is getting stronger. He feels like there is more going on than his ILD. He had a heart catheterization 04/20/18 with preliminary results looking like no blockages greater than 40% with finial results with treatment pending. Will continue to monitor for progress.     Patient has completed 15 sessions losing 6 lbs since he started the program. He has missed several sessions since last 30 day review due to cardiovascular workup which was negative. He continues to say that the program has not helped him in his breathing or strength. He is pleased with his weight loss. Will continue to monitor for progress.   Patient has completed 22 sessions losing 2 lbs since he started the program. He is doing well in the program with progression. He says he feels stronger some days but not other days. He sees a little improvement in his breathing. He has not increased his activity a lot. Will continue to monitor for progress.   Patient has completed 27 sessions  gaining 2 lbs since last 30 day review. His reported glucose readings are usually greater than 120. He says he is trying to follow a low carb diabetic diet. He continues to do well in the program with progression.  He has seen some improvement in his breathing but continues to say miminal improvement in his strength or endurance. Will continue to monitor for progress.    Expected Outcomes  Patient will continue to attend sessions and complete the program meeting his personal goals.   Patient will continue to attend sessions and complete the program meeting his personal goals.   Patient will continue to attend sessions and complete the program meeting his personal goals.   Patient will continue to attend sessions and complete the program meeting his personal goals.   Patient will continue to attend sessions and complete the program meeting his personal goals.    [place] Name 07/29/18 1541 08/12/18 1607           Core Components/Risk Factors/Patient Goals Review   Personal Goals Review  Weight Management/Obesity;Improve shortness of breath with ADL's;Increase knowledge of respiratory medications and ability to use respiratory devices properly.;Diabetes Breathe better; Do ADL's without SOB; get stronger; lose 10 lbs.   Weight Management/Obesity;Improve shortness of breath with ADL's;Increase knowledge of respiratory medications and ability to use respiratory devices properly.;Diabetes DM controll; breathe better; increase strength.       Review  Patient has completed 33 sessions losing 1 lb since last 30 day review. His reported glucose readings continue to be on average greater than 130. He has done well in the program with progression. He says his strength and stamina continue to improve with some improvement in his SOB with activity. Will continue to monitor for progress.   Patient completed the program with 36 sessions losing one lb overall. He did well overall with exit measurements improving in balance and  his exit walk test improved by 9%. His pulmonary assessment scores did not improve but was essentially the same. He tolerated all progressions but says he does not feel better since he started. His reports his breathing  also did not improve. He feels like there are other issues going on that has not been diagnosed yet. He is working with his cardiologist and pulmonologist and has already done several diagnostics testing that were all unremarkable. He says the program did help him get up and doing something and helped him establish an exercise program. He plans to continue exercising at home by walking. PR will f/u for one year.       Expected Outcomes  Patient will continue to attend sessions and complete the program meeting his personal goals.   Patient will continue to exercise at home by walking and continue work toward meeting his personal goals.          Exercise Goals and Review: Exercise Goals    Row Name 03/03/18 1406             Exercise Goals   Increase Physical Activity  Yes       Intervention  Provide advice, education, support and counseling about physical activity/exercise needs.;Develop an individualized exercise prescription for aerobic and resistive training based on initial evaluation findings, risk stratification, comorbidities and participant's personal goals.       Expected Outcomes  Short Term: Attend rehab on a regular basis to increase amount of physical activity.       Increase Strength and Stamina  Yes       Intervention  Provide advice, education, support and counseling about physical activity/exercise needs.;Develop an individualized exercise prescription for aerobic and resistive training based on initial evaluation findings, risk stratification, comorbidities and participant's personal goals.       Expected Outcomes  Short Term: Increase workloads from initial exercise prescription for resistance, speed, and METs.       Able to understand and use rate of perceived  exertion (RPE) scale  Yes       Intervention  Provide education and explanation on how to use RPE scale       Expected Outcomes  Short Term: Able to use RPE daily in rehab to express subjective intensity level;Long Term:  Able to use RPE to guide intensity level when exercising independently       Able to understand and use Dyspnea scale  Yes       Intervention  Provide education and explanation on how to use Dyspnea scale       Expected Outcomes  Short Term: Able to use Dyspnea scale daily in rehab to express subjective sense of shortness of breath during exertion;Long Term: Able to use Dyspnea scale to guide intensity level when exercising independently       Knowledge and understanding of Target Heart Rate Range (THRR)  Yes       Intervention  Provide education and explanation of THRR including how the numbers were predicted and where they are located for reference       Expected Outcomes  Short Term: Able to state/look up THRR;Long Term: Able to use THRR to govern intensity when exercising independently;Short Term: Able to use daily as guideline for intensity in rehab       Able to check pulse independently  Yes       Intervention  Provide education and demonstration on how to check pulse in carotid and radial arteries.;Review the importance of being able to check your own pulse for safety during independent exercise       Expected Outcomes  Short Term: Able to explain why pulse checking is important during independent exercise;Long Term: Able to check pulse  independently and accurately       Understanding of Exercise Prescription  Yes       Intervention  Provide education, explanation, and written materials on patient's individual exercise prescription       Expected Outcomes  Short Term: Able to explain program exercise prescription;Long Term: Able to explain home exercise prescription to exercise independently          Nutrition & Weight - Outcomes: Pre Biometrics - 03/03/18 1406      Pre  Biometrics   Height  _0  (1.803 m)    Weight  95.3 kg    Waist Circumference  42 inches    Hip Circumference  43 inches    Waist to Hip Ratio  0.98 %    BMI (Calculated)  29.3    Triceps Skinfold  9 mm    % Body Fat  27.2 %    Grip Strength  62.29 kg    Flexibility  0 in    Single Leg Stand  2 seconds      Post Biometrics - 08/05/18 1522       Post  Biometrics   Height  _1  (1.803 m)    Weight  95.3 kg    Waist Circumference  42 inches    Hip Circumference  44 inches    Waist to Hip Ratio  0.95 %    BMI (Calculated)  29.32    Triceps Skinfold  4 mm    % Body Fat  24 %    Grip Strength  58.16 kg    Flexibility  0 in    Single Leg Stand  3.04 seconds       Nutrition: Nutrition Therapy & Goals - 03/11/18 1616      Personal Nutrition Goals   Nutrition Goal  For heart healthy choices add >50% of whole grains, make half their plate fruits and vegetables. Discuss the difference between starchy vegetables and leafy greens, and how leafy vegetables provide fiber, helps maintain healthy weight, helps control blood glucose, and lowers cholesterol.  Discuss purchasing fresh or frozen vegetable to reduce sodium and not to add grease, fat or sugar. Consume <18oz of red meat per week. Consume lean cuts of meats and very little of meats high in sodium and nitrates such as pork and lunch meats. Discussed portion control for all food groups.     Additional Goals?  No      Intervention Plan   Intervention  Nutrition handout(s) given to patient.    Expected Outcomes  Short Term Goal: Understand basic principles of dietary content, such as calories, fat, sodium, cholesterol and nutrients.       Nutrition Discharge: Nutrition Assessments - 08/12/18 1604      MEDFICTS Scores   Pre Score  36    Post Score  56    Score Difference  20       Education Questionnaire Score: Knowledge Questionnaire Score - 08/12/18 1604      Knowledge Questionnaire Score   Pre Score  12/18    Post  Score  15/18       Goals reviewed with patient; copy given to patient.

## 2018-08-24 DIAGNOSIS — Z85820 Personal history of malignant melanoma of skin: Secondary | ICD-10-CM | POA: Diagnosis not present

## 2018-08-24 DIAGNOSIS — L570 Actinic keratosis: Secondary | ICD-10-CM | POA: Diagnosis not present

## 2018-08-26 DIAGNOSIS — G44229 Chronic tension-type headache, not intractable: Secondary | ICD-10-CM | POA: Diagnosis not present

## 2018-09-17 ENCOUNTER — Encounter (HOSPITAL_COMMUNITY)
Admission: RE | Admit: 2018-09-17 | Discharge: 2018-09-17 | Disposition: A | Discharge status: Home / Self Care | Payer: Medicare Other | Source: Ambulatory Visit | Attending: Internal Medicine | Admitting: Internal Medicine

## 2018-09-17 DIAGNOSIS — K5190 Ulcerative colitis, unspecified, without complications: Secondary | ICD-10-CM | POA: Diagnosis not present

## 2018-09-17 MED ORDER — SODIUM CHLORIDE 0.9 % IV SOLN
500.0000 mg | Freq: Once | INTRAVENOUS | Status: AC
  Administered 2018-09-17: 500 mg via INTRAVENOUS
  Filled 2018-09-17: qty 50

## 2018-09-17 MED ORDER — SODIUM CHLORIDE 0.9 % IV SOLN
Freq: Once | INTRAVENOUS | Status: AC
  Administered 2018-09-17: 250 mL via INTRAVENOUS

## 2018-09-28 DIAGNOSIS — H40013 Open angle with borderline findings, low risk, bilateral: Secondary | ICD-10-CM | POA: Diagnosis not present

## 2018-10-09 DIAGNOSIS — E782 Mixed hyperlipidemia: Secondary | ICD-10-CM | POA: Diagnosis not present

## 2018-10-09 DIAGNOSIS — I10 Essential (primary) hypertension: Secondary | ICD-10-CM | POA: Diagnosis not present

## 2018-10-09 DIAGNOSIS — K219 Gastro-esophageal reflux disease without esophagitis: Secondary | ICD-10-CM | POA: Diagnosis not present

## 2018-10-13 ENCOUNTER — Telehealth: Payer: Self-pay | Admitting: Pulmonary Disease

## 2018-10-13 NOTE — Telephone Encounter (Signed)
I have left vm for Tara in Resp Therapy to call pt & try to get him scheduled at [facility] for pft prior to appt with PM.  Vallarie Mare has CT order & will get it scheduled

## 2018-10-13 NOTE — Telephone Encounter (Signed)
I got it scheduled pt aware of appt 10/18/18

## 2018-10-13 NOTE — Telephone Encounter (Signed)
Pt currently scheduled for  OV on 10/15/18 with Dr. Vaughan Browner. Pt last seen 7/11/9 and instructed to have CT, PFT and SMW piror to next OV in Jan.  I have spoken to pt, who stated that he is not having any worsen symptoms. Pt stated he scheduled 10/15/18 OV, due to receiving a recall letter from our office.   Pt is requesting to have all test and OV prior to Jan, due to switching insurance. 10/15/18 OV has been rescheduled for 10/27/18.  PCC's can CT and PFT be performed prior to 10/27/18. Thanks

## 2018-10-15 ENCOUNTER — Ambulatory Visit: Payer: Medicare Other

## 2018-10-15 ENCOUNTER — Ambulatory Visit: Payer: Medicare Other | Admitting: Pulmonary Disease

## 2018-10-18 ENCOUNTER — Ambulatory Visit (HOSPITAL_COMMUNITY)
Admission: RE | Admit: 2018-10-18 | Discharge: 2018-10-18 | Disposition: A | Discharge status: Home / Self Care | Payer: Medicare Other | Source: Ambulatory Visit | Attending: Pulmonary Disease | Admitting: Pulmonary Disease

## 2018-10-18 DIAGNOSIS — J849 Interstitial pulmonary disease, unspecified: Secondary | ICD-10-CM | POA: Diagnosis not present

## 2018-10-18 DIAGNOSIS — R918 Other nonspecific abnormal finding of lung field: Secondary | ICD-10-CM | POA: Diagnosis not present

## 2018-10-20 ENCOUNTER — Ambulatory Visit (INDEPENDENT_AMBULATORY_CARE_PROVIDER_SITE_OTHER): Payer: Medicare Other | Admitting: Pulmonary Disease

## 2018-10-20 DIAGNOSIS — J849 Interstitial pulmonary disease, unspecified: Secondary | ICD-10-CM

## 2018-10-20 LAB — PULMONARY FUNCTION TEST
DL/VA % PRED: 91 %
DL/VA: 4.24 ml/min/mmHg/L
DLCO UNC % PRED: 46 %
DLCO UNC: 15.85 ml/min/mmHg
FEF 25-75 Pre: 2.89 L/sec
FEF2575-%Pred-Pre: 146 %
FEV1-%Pred-Pre: 66 %
FEV1-Pre: 1.96 L
FEV1FVC-%Pred-Pre: 120 %
FEV6-%PRED-PRE: 59 %
FEV6-Pre: 2.29 L
FEV6FVC-%PRED-PRE: 107 %
FVC-%Pred-Pre: 55 %
FVC-PRE: 2.3 L
PRE FEV6/FVC RATIO: 100 %
Pre FEV1/FVC ratio: 85 %

## 2018-10-20 NOTE — Progress Notes (Signed)
Spirometry and Dlco done today. 

## 2018-10-26 ENCOUNTER — Telehealth: Payer: Self-pay | Admitting: Pulmonary Disease

## 2018-10-26 DIAGNOSIS — I700 Atherosclerosis of aorta: Secondary | ICD-10-CM | POA: Diagnosis not present

## 2018-10-26 DIAGNOSIS — I2510 Atherosclerotic heart disease of native coronary artery without angina pectoris: Secondary | ICD-10-CM | POA: Diagnosis not present

## 2018-10-26 DIAGNOSIS — R0609 Other forms of dyspnea: Secondary | ICD-10-CM | POA: Diagnosis not present

## 2018-10-26 NOTE — Telephone Encounter (Signed)
Left detailed message requesting that pt arrive 68mn prior to appt time on 10/27/18 to complete ILD questionnaire. Nothing further is needed.

## 2018-10-27 ENCOUNTER — Encounter: Payer: Self-pay | Admitting: Pulmonary Disease

## 2018-10-27 ENCOUNTER — Ambulatory Visit (INDEPENDENT_AMBULATORY_CARE_PROVIDER_SITE_OTHER): Payer: Medicare Other | Admitting: *Deleted

## 2018-10-27 ENCOUNTER — Ambulatory Visit (INDEPENDENT_AMBULATORY_CARE_PROVIDER_SITE_OTHER): Payer: Medicare Other | Admitting: Pulmonary Disease

## 2018-10-27 VITALS — BP 136/70 | HR 102 | Ht 71.0 in | Wt 208.0 lb

## 2018-10-27 DIAGNOSIS — J849 Interstitial pulmonary disease, unspecified: Secondary | ICD-10-CM

## 2018-10-27 DIAGNOSIS — R0602 Shortness of breath: Secondary | ICD-10-CM | POA: Diagnosis not present

## 2018-10-27 DIAGNOSIS — I2510 Atherosclerotic heart disease of native coronary artery without angina pectoris: Secondary | ICD-10-CM

## 2018-10-27 NOTE — Patient Instructions (Signed)
I have reviewed your CT and lung function test which shows stable interstitial lung disease  We will continue to monitor this closely We will follow back in 6 months with spirometry, diffusion capacity and 6-minute walk test

## 2018-10-27 NOTE — Progress Notes (Signed)
Manuel Atkins    [id number]    03/05/1933  Primary Care Physician:Daniel, Mitzie Na, MD  Referring Physician: Caryl Bis, MD [address] [place], [place] [address]  Chief complaint: Follow up for interstitial lung disease.   HPI: 84 year old with history of OSA, ulcerative colitis, GERD, elevated transaminitis.  Hospitalized in early January 2019 at [facility] for dyspnea on exertion, hypoxia.  He had been evaluated with echo showing normal EF, proBNP was normal, d-dimer was normal.  He was started on Lasix without improvement.  He had a CT scan which showed mild bronchiectasis with basal interstitial lung disease.  He was started on 20 mg of prednisone and referred to pulmonary.    He was seen by pulmonary at [facility] but wants to transition care to [facility]. He follows with Dr. Laural Golden for ulcerative colitis and is on infliximab and mesalamine.  GERD is complicated by esophageal stricture status post dilation 4 years ago.  Symptoms are stable on PPI.  Elevated transaminases are thought to be secondary to fatty liver.  He still has occasional dysphagia and choking on food. Seen by Dr. Laural Golden and underwent dilation of esophageal stricture on 12/15/17.  Heartburn symptoms are controlled on therapy.  He is had a work-up by Dr. Virgina Jock, cardiology with right and left heart cath showing nonobstructive coronary artery disease, no evidence of pulmonary hypertension.   Pets: Has dogs.  Exposed to farm animals in childhood.  No birds Occupation: Retired Art gallery manager.  Used to work in [employer]. Exposures: May have been exposed to asbestos.  Reports exposure to cotton dust.  He has a hobby of woodworking and is exposed to wood dust.  No mold, hot tubs. jacuzzi. Smoking history: 10-pack-year smoking history.  Quit in 1980  Travel History: Not significant  Interim history: Finished pulmonary rehab Seen on exertion is stable.  Denies any cough, sputum production, fevers,  chills.  Outpatient Encounter Medications as of 10/27/2018  Medication Sig  . acetaminophen (TYLENOL) 500 MG tablet Take 1,000 mg by mouth every 6 (six) hours as needed for moderate pain or headache.  . ALPRAZolam (XANAX) 0.25 MG tablet Take 0.5 tablets (0.125 mg total) by mouth 3 (three) times daily as needed for anxiety. for anxiety  . amLODipine (NORVASC) 2.5 MG tablet Take 2.5 mg by mouth daily.  Marland Kitchen aspirin EC 81 MG tablet Take 81 mg by mouth daily.  Marland Kitchen imipramine (TOFRANIL) 25 MG tablet Take 75 mg by mouth at bedtime  . latanoprost (XALATAN) 0.005 % ophthalmic solution Place 1 drop into both eyes at bedtime.  . metFORMIN (GLUCOPHAGE-XR) 500 MG 24 hr tablet Take 1,000 mg by mouth at bedtime.  . nitroGLYCERIN (NITROSTAT) 0.4 MG SL tablet Place 0.4 mg under the tongue every 5 (five) minutes as needed for chest pain.   . pantoprazole (PROTONIX) 40 MG tablet Take 1 tablet (40 mg total) by mouth every other day.  . Probiotic Product (PROBIOTIC DAILY PO) Take 1 capsule by mouth daily.    No facility-administered encounter medications on file as of 10/27/2018.    Physical Exam: Blood pressure 136/70, pulse (!) 102, height 5' 11"  (1.803 m), weight 208 lb (94.3 kg), SpO2 99 %. Gen:      No acute distress HEENT:  EOMI, sclera anicteric Neck:     No masses; no thyromegaly Lungs:    Clear to auscultation bilaterally; normal respiratory effort CV:         Regular  rate and rhythm; no murmurs Abd:      + bowel sounds; soft, non-tender; no palpable masses, no distension Ext:    No edema; adequate peripheral perfusion Skin:      Warm and dry; no rash Neuro: alert and oriented x 3 Psych: normal mood and affect  Data Reviewed: CT scan 07/27/2008-mild basal atelectasis right upper lobe pulmonary embolism CT scan 11/16/17-mild bronchiectasis, basal reticulation right greater than left.  Borderline right hilar lymphadenopathy.  Nodular liver contour possible cirrhosis High-resolution CT 01/26/18- patchy  ground glass attenuation, mild bronchiectasis, septal thickening with no basal gradient.  No honeycombing. Indeterminate for UIP.  Hepatic steatosis, aortic atherosclerosis, left main and left anterior coronary artery disease. High-resolution CT 05/04/2018-mild basal predominant lung fibrosis stable from prior exam. High-resolution CT 10/18/2018-stable interstitial lung disease I have reviewed the images personally.  Barium swallow 05/31/13- mild impairment of esophageal motility, stricture at GE junction with obstruction of barium tablet.  Labs Connective tissue serologies 11/23/17-ANA, ACE, CCP, rheumatoid factor all negative Hypersenitivity panel-negative  PFTs  12/23/17 FVC 2.30 (54%), FEV1 1.97 [66%], F/F 86, TLC 64%, DLCO 42% No obstruction, moderate restriction with severe diffusion defect  05/18/2018 FVC 2.32 [55%), FEV1 1.91 [65%], F/F 82, DLCO 43% Moderate restriction, severe diffusion defect.  10/20/2018 FVC 2.30 [55%], FEV1 1.96 [6 6%], F/F 85, DLCO 46% Moderate restriction, severe diffusion defect.  6-minute walk 03/03/18- 338 m 6-minute walk 05/20/18- 288 m 6-minute walk 10/27/18- 363 m  Cardiac RHC 04/20/18 RA: 9 mmHg RV: 29/7 mmHg PA: 29/12 mmHg, mean PAP 19 mmHg PCWP: 9 mmHg LVEDP: 14 mmHg  CO: 4.8 L/min CI: 2.3 L/min/m2  Sleep Received copy of sleep study from [facility] pulmonary, 08/01/2011 BiPAP titration.  Titrated to IPAP 17, EPAP 13.  Severe PLM's noted.  Consider treatment for restless leg syndrome.  Assessment:  Follow-up for bronchiectasis, interstitial lung disease. He could have interstitial lung disease from occupational, recreational exposure to cotton dust, wood dust, possible asbestos.  Other considerations include pneumonitis from ulcerative colitis or from infliximab, meslamine therapy or chronic aspiration from esophageal stricture.  Connective tissue serologies are negative. There is no clear evidence that he has IPF.   Cardiac work-up as noted  above with no evidence of significant coronary artery disease or pulmonary hypertension. Cardio pulmonary exercise test shows dyspnea likely from combination of cardiac, pulmonary factors. For definite diagnosis of ILD and treatment he will need an open lung biopsy. We had an extensive discussion about risks benefits of the biopsy versus empirically treating him with anti-fibrotics Discussed the option of Envisia test through bronchoscope but he would like to defer all biopsies since his symptoms are stable  Follow-up spirometry, diffusion capacity and 6-minute walk test in 6 months.  OSA Continues on BiPAP  Plan/Recommendations: - Spirometry, diffusion capacity. 6 MW  Marshell Garfinkel MD [facility] 10/27/2018, 9:49 AM  CC: Caryl Bis, MD

## 2018-10-27 NOTE — Progress Notes (Signed)
SIX MIN WALK 10/27/2018 05/18/2018 04/27/2018 01/15/2018  Medications none Norvasc 1.28m tab, Asa 841m Lialda 1.2g, Metformin 50021mProtonix 45m20mll taken approx 8:30 am.  - -  Supplimental Oxygen during Test? (L/min) No No No No  Laps 10 6 - -  Partial Lap (in Meters) 23 0 - -  Baseline BP (sitting) 144/80 124/66 - -  Baseline Heartrate 84 87 - -  Baseline Dyspnea (Borg Scale) 3 2 - -  Baseline Fatigue (Borg Scale) 4 0.5 - -  Baseline SPO2 95 99 - -  BP (sitting) 160/92 132/70 - -  Heartrate 89 97 - -  Dyspnea (Borg Scale) 4 3 - -  Fatigue (Borg Scale) 4 2 - -  SPO2 94 100 - -  BP (sitting) 160/90 126/68 - -  Heartrate 88 77 - -  SPO2 98 98 - -  Stopped or Paused before Six Minutes No No - -  Interpretation Dizziness Hip pain - -  Distance Completed 363 288 - -  Tech Comments: - pt performed test with the use of a cane.  tolerated walk well.   pt walked a moderate pace with a cane, tolerated walk well -

## 2018-10-28 DIAGNOSIS — L110 Acquired keratosis follicularis: Secondary | ICD-10-CM | POA: Diagnosis not present

## 2018-10-28 DIAGNOSIS — E1140 Type 2 diabetes mellitus with diabetic neuropathy, unspecified: Secondary | ICD-10-CM | POA: Diagnosis not present

## 2018-10-28 DIAGNOSIS — B351 Tinea unguium: Secondary | ICD-10-CM | POA: Diagnosis not present

## 2018-11-08 DIAGNOSIS — E782 Mixed hyperlipidemia: Secondary | ICD-10-CM | POA: Diagnosis not present

## 2018-11-08 DIAGNOSIS — K219 Gastro-esophageal reflux disease without esophagitis: Secondary | ICD-10-CM | POA: Diagnosis not present

## 2018-11-08 DIAGNOSIS — I10 Essential (primary) hypertension: Secondary | ICD-10-CM | POA: Diagnosis not present

## 2018-11-08 DIAGNOSIS — E1122 Type 2 diabetes mellitus with diabetic chronic kidney disease: Secondary | ICD-10-CM | POA: Diagnosis not present

## 2018-11-08 DIAGNOSIS — K5100 Ulcerative (chronic) pancolitis without complications: Secondary | ICD-10-CM | POA: Diagnosis not present

## 2018-11-08 DIAGNOSIS — N400 Enlarged prostate without lower urinary tract symptoms: Secondary | ICD-10-CM | POA: Diagnosis not present

## 2018-11-12 ENCOUNTER — Telehealth: Payer: Self-pay | Admitting: Pulmonary Disease

## 2018-11-12 ENCOUNTER — Encounter (HOSPITAL_COMMUNITY)
Admission: RE | Admit: 2018-11-12 | Discharge: 2018-11-12 | Disposition: A | Discharge status: Home / Self Care | Payer: Medicare Other | Source: Ambulatory Visit | Attending: Internal Medicine | Admitting: Internal Medicine

## 2018-11-12 DIAGNOSIS — K5190 Ulcerative colitis, unspecified, without complications: Secondary | ICD-10-CM | POA: Insufficient documentation

## 2018-11-12 MED ORDER — INFLIXIMAB 100 MG IV SOLR
5.0000 mg/kg | Freq: Once | INTRAVENOUS | Status: AC
Start: 2018-11-12 — End: 2018-11-12
  Administered 2018-11-12: 500 mg via INTRAVENOUS
  Filled 2018-11-12: qty 50

## 2018-11-12 MED ORDER — SODIUM CHLORIDE 0.9 % IV SOLN
Freq: Once | INTRAVENOUS | Status: AC
  Administered 2018-11-12: 250 mL via INTRAVENOUS

## 2018-11-12 NOTE — Progress Notes (Signed)
Pt states that he took his tylenol, claritin and benadryl at home before coming to hospital.

## 2018-11-12 NOTE — Telephone Encounter (Signed)
I have checked with [facility] and she has not seen this form. Rodena Piety do you happen to have this?

## 2018-11-12 NOTE — Telephone Encounter (Signed)
I don't have these forms but I just spoke with [place] and she will be faxing to my attn right now

## 2018-11-12 NOTE — Telephone Encounter (Signed)
I now have this form for Dr. Vaughan Browner to sign but he will not be in office until 11/24/2018. The form also has the patient's 30 day and 90 day download. When Dr. Vaughan Browner returns to office I will get him to sign the form then

## 2018-11-15 ENCOUNTER — Encounter: Payer: Self-pay | Admitting: Internal Medicine

## 2018-11-15 DIAGNOSIS — R5383 Other fatigue: Secondary | ICD-10-CM | POA: Diagnosis not present

## 2018-11-15 DIAGNOSIS — E1122 Type 2 diabetes mellitus with diabetic chronic kidney disease: Secondary | ICD-10-CM | POA: Diagnosis not present

## 2018-11-15 DIAGNOSIS — I10 Essential (primary) hypertension: Secondary | ICD-10-CM | POA: Diagnosis not present

## 2018-11-15 DIAGNOSIS — E871 Hypo-osmolality and hyponatremia: Secondary | ICD-10-CM | POA: Diagnosis not present

## 2018-11-15 DIAGNOSIS — E1165 Type 2 diabetes mellitus with hyperglycemia: Secondary | ICD-10-CM | POA: Diagnosis not present

## 2018-11-15 DIAGNOSIS — E782 Mixed hyperlipidemia: Secondary | ICD-10-CM | POA: Diagnosis not present

## 2018-11-15 DIAGNOSIS — N183 Chronic kidney disease, stage 3 (moderate): Secondary | ICD-10-CM | POA: Diagnosis not present

## 2018-11-15 DIAGNOSIS — K219 Gastro-esophageal reflux disease without esophagitis: Secondary | ICD-10-CM | POA: Diagnosis not present

## 2018-11-15 NOTE — Telephone Encounter (Signed)
Will hold message until Dr. Vaughan Browner returns to clinic on 1/15.

## 2018-11-16 DIAGNOSIS — G43909 Migraine, unspecified, not intractable, without status migrainosus: Secondary | ICD-10-CM | POA: Diagnosis not present

## 2018-11-16 DIAGNOSIS — E782 Mixed hyperlipidemia: Secondary | ICD-10-CM | POA: Diagnosis not present

## 2018-11-16 DIAGNOSIS — I10 Essential (primary) hypertension: Secondary | ICD-10-CM | POA: Diagnosis not present

## 2018-11-16 DIAGNOSIS — E1122 Type 2 diabetes mellitus with diabetic chronic kidney disease: Secondary | ICD-10-CM | POA: Diagnosis not present

## 2018-11-16 DIAGNOSIS — D696 Thrombocytopenia, unspecified: Secondary | ICD-10-CM | POA: Diagnosis not present

## 2018-11-17 DIAGNOSIS — I10 Essential (primary) hypertension: Secondary | ICD-10-CM | POA: Diagnosis not present

## 2018-11-17 DIAGNOSIS — I2510 Atherosclerotic heart disease of native coronary artery without angina pectoris: Secondary | ICD-10-CM | POA: Diagnosis not present

## 2018-11-17 DIAGNOSIS — R0609 Other forms of dyspnea: Secondary | ICD-10-CM | POA: Diagnosis not present

## 2018-11-17 DIAGNOSIS — E119 Type 2 diabetes mellitus without complications: Secondary | ICD-10-CM | POA: Diagnosis not present

## 2018-11-17 NOTE — Telephone Encounter (Signed)
Message held until Dr. Matilde Bash return to office, 11/24/18.

## 2018-11-18 NOTE — Telephone Encounter (Signed)
Message being held until 11/24/18.

## 2018-11-24 ENCOUNTER — Ambulatory Visit: Payer: Medicare Other | Admitting: Pulmonary Disease

## 2018-11-24 NOTE — Telephone Encounter (Signed)
Dr. Vaughan Browner is back in the office today, 11/24/18.  Rodena Piety please advise if forms are signed today.  Will route to [place] to follow up

## 2018-11-24 NOTE — Telephone Encounter (Signed)
I actually gave the forms to him the other day when he was here to see just one patient. I'm not sure what happened to the form and download. Margie isn't here today so who knows.

## 2018-11-25 DIAGNOSIS — H2512 Age-related nuclear cataract, left eye: Secondary | ICD-10-CM | POA: Diagnosis not present

## 2018-11-25 DIAGNOSIS — H25013 Cortical age-related cataract, bilateral: Secondary | ICD-10-CM | POA: Diagnosis not present

## 2018-11-25 DIAGNOSIS — H401134 Primary open-angle glaucoma, bilateral, indeterminate stage: Secondary | ICD-10-CM | POA: Diagnosis not present

## 2018-11-25 DIAGNOSIS — H35033 Hypertensive retinopathy, bilateral: Secondary | ICD-10-CM | POA: Diagnosis not present

## 2018-11-25 DIAGNOSIS — H2513 Age-related nuclear cataract, bilateral: Secondary | ICD-10-CM | POA: Diagnosis not present

## 2018-11-25 NOTE — Telephone Encounter (Signed)
Margie- have the forms been signed yet? Please advise thanks

## 2018-11-25 NOTE — Telephone Encounter (Signed)
Form has been placed in Dr. Matilde Bash folder to be completed.   Will route to Dr. Vaughan Browner to make aware.

## 2018-11-26 NOTE — Telephone Encounter (Signed)
Message previously routed to Dr. Vaughan Browner.

## 2018-11-28 DIAGNOSIS — G4733 Obstructive sleep apnea (adult) (pediatric): Secondary | ICD-10-CM | POA: Diagnosis not present

## 2018-11-29 NOTE — Telephone Encounter (Signed)
Dr. Vaughan Browner returns to the office today. Message will also be routed to [facility].

## 2018-11-29 NOTE — Telephone Encounter (Signed)
Spoke to [place], who states that forms have been completed and faxed back to [facility]. Nothing further is needed.

## 2018-12-07 DIAGNOSIS — Z23 Encounter for immunization: Secondary | ICD-10-CM | POA: Diagnosis not present

## 2018-12-14 DIAGNOSIS — H2512 Age-related nuclear cataract, left eye: Secondary | ICD-10-CM | POA: Diagnosis not present

## 2018-12-14 DIAGNOSIS — H52222 Regular astigmatism, left eye: Secondary | ICD-10-CM | POA: Diagnosis not present

## 2018-12-14 DIAGNOSIS — H25812 Combined forms of age-related cataract, left eye: Secondary | ICD-10-CM | POA: Diagnosis not present

## 2018-12-28 DIAGNOSIS — L570 Actinic keratosis: Secondary | ICD-10-CM | POA: Diagnosis not present

## 2018-12-29 DIAGNOSIS — G4733 Obstructive sleep apnea (adult) (pediatric): Secondary | ICD-10-CM | POA: Diagnosis not present

## 2019-01-06 DIAGNOSIS — E1140 Type 2 diabetes mellitus with diabetic neuropathy, unspecified: Secondary | ICD-10-CM | POA: Diagnosis not present

## 2019-01-06 DIAGNOSIS — B351 Tinea unguium: Secondary | ICD-10-CM | POA: Diagnosis not present

## 2019-01-06 DIAGNOSIS — L110 Acquired keratosis follicularis: Secondary | ICD-10-CM | POA: Diagnosis not present

## 2019-01-07 ENCOUNTER — Encounter (HOSPITAL_COMMUNITY): Payer: Self-pay

## 2019-01-07 ENCOUNTER — Encounter (HOSPITAL_COMMUNITY)
Admission: RE | Admit: 2019-01-07 | Discharge: 2019-01-07 | Disposition: A | Discharge status: Home / Self Care | Payer: Medicare Other | Source: Ambulatory Visit | Attending: Internal Medicine | Admitting: Internal Medicine

## 2019-01-07 DIAGNOSIS — K5190 Ulcerative colitis, unspecified, without complications: Secondary | ICD-10-CM | POA: Diagnosis not present

## 2019-01-07 MED ORDER — SODIUM CHLORIDE 0.9 % IV SOLN
Freq: Once | INTRAVENOUS | Status: AC
  Administered 2019-01-07: 09:00:00 via INTRAVENOUS

## 2019-01-07 MED ORDER — SODIUM CHLORIDE 0.9 % IV SOLN
5.0000 mg/kg | Freq: Once | INTRAVENOUS | Status: AC
  Administered 2019-01-07: 500 mg via INTRAVENOUS
  Filled 2019-01-07: qty 50

## 2019-01-10 DIAGNOSIS — H2511 Age-related nuclear cataract, right eye: Secondary | ICD-10-CM | POA: Diagnosis not present

## 2019-01-10 DIAGNOSIS — H25011 Cortical age-related cataract, right eye: Secondary | ICD-10-CM | POA: Diagnosis not present

## 2019-01-11 ENCOUNTER — Encounter (INDEPENDENT_AMBULATORY_CARE_PROVIDER_SITE_OTHER): Payer: Self-pay | Admitting: Internal Medicine

## 2019-01-11 ENCOUNTER — Ambulatory Visit (INDEPENDENT_AMBULATORY_CARE_PROVIDER_SITE_OTHER): Payer: Medicare Other | Admitting: Internal Medicine

## 2019-01-11 VITALS — BP 146/80 | HR 92 | Temp 97.3°F | Resp 18 | Ht 72.0 in | Wt 209.8 lb

## 2019-01-11 DIAGNOSIS — K5100 Ulcerative (chronic) pancolitis without complications: Secondary | ICD-10-CM

## 2019-01-11 NOTE — Patient Instructions (Signed)
Will request copy of recent blood work from Dr. Olena Heckle office.

## 2019-01-11 NOTE — Progress Notes (Signed)
Presenting complaint;  Follow-up for ulcerative colitis.  Database and subjective:  Patient is 85 year old Caucasian male with over 50-year history of ulcerative colitis who has been maintained on infliximab for the past few years who is here for scheduled visit accompanied by his wife.  He was last seen on 07/06/2018.  On his last visit it was decided to discontinue Lialda because of interstitial lung disease.  There was concern if oral mesalamine was playing a role.  Patient states he has had no problems since stopping the diet.  If anything he noted more constipation.  He has not required any laxative for it.  He is generally having 1 formed stool daily.  He denies abdominal pain melena or rectal bleeding.  Last infliximab infusion was on 01/07/2019.  He has not had any side effects. He feels his interstitial lung disease has stabilized.  He was going to [employer] but since flu season he has decided not to go there. He feels heartburn is well controlled with PPI every other day.  He denies dysphagia. He had blood work by Dr. Gar Ponto within the last month or 2 and it was normal.  Current Medications: Outpatient Encounter Medications as of 01/11/2019  Medication Sig  . acetaminophen (TYLENOL) 500 MG tablet Take 1,000 mg by mouth every 6 (six) hours as needed for moderate pain or headache.  . ALPRAZolam (XANAX) 0.25 MG tablet Take 0.5 tablets (0.125 mg total) by mouth 3 (three) times daily as needed for anxiety. for anxiety  . amLODipine (NORVASC) 2.5 MG tablet Take 2.5 mg by mouth daily.  Marland Kitchen aspirin EC 81 MG tablet Take 81 mg by mouth daily.  Marland Kitchen imipramine (TOFRANIL) 25 MG tablet Take 75 mg by mouth at bedtime  . inFLIXimab in sodium chloride 0.9 % Inject 5 mg/kg into the vein every 8 (eight) weeks.  Marland Kitchen latanoprost (XALATAN) 0.005 % ophthalmic solution Place 1 drop into both eyes at bedtime.  . metFORMIN (GLUCOPHAGE-XR) 500 MG 24 hr tablet Take 1,000 mg by mouth at bedtime.  .  nitroGLYCERIN (NITROSTAT) 0.4 MG SL tablet Place 0.4 mg under the tongue every 5 (five) minutes as needed for chest pain.   . pantoprazole (PROTONIX) 40 MG tablet Take 1 tablet (40 mg total) by mouth every other day.  . Probiotic Product (PROBIOTIC DAILY PO) Take 1 capsule by mouth daily.   . rosuvastatin (CRESTOR) 10 MG tablet TAKE ONE TABLET BY MOUTH DAILY AT NIGHT   No facility-administered encounter medications on file as of 01/11/2019.     Objective: Blood pressure (!) 146/80, pulse 92, temperature (!) 97.3 F (36.3 C), temperature source Oral, resp. rate 18, height 6' (1.829 m), weight 209 lb 12.8 oz (95.2 kg). Patient is alert and in no acute distress. Conjunctiva is pink. Sclera is nonicteric Oropharyngeal mucosa is normal. No neck masses or thyromegaly noted. Cardiac exam with regular rhythm normal S1 and S2. No murmur or gallop noted. Auscultation of lungs reveal bibasilar dry crackles. Abdomen is full but soft and nontender with organomegaly or masses. He has trace edema around left ankle.   Assessment:  #1.  Chronic ulcerative colitis.  He is presently on infliximab infusion every 8 weeks for maintenance.  His disease has not relapsed on stopping oral mesalamine.  He remains in remission.  Given chronicity and severity of his disease I feel he should continue with infliximab until it stops working or he experiences side effects. Patient is fully aware of potential complications and he should avoid  being in crowds but typically do flu season.   Plan:  Continue infliximab infusion every 8 weeks for maintenance as before. Will request copy of recent blood work from Dr. Olena Heckle office for review. Patient will call if he experiences diarrhea or rectal bleeding otherwise return for office visit in 6 months.

## 2019-01-18 DIAGNOSIS — H25811 Combined forms of age-related cataract, right eye: Secondary | ICD-10-CM | POA: Diagnosis not present

## 2019-01-18 DIAGNOSIS — H2511 Age-related nuclear cataract, right eye: Secondary | ICD-10-CM | POA: Diagnosis not present

## 2019-01-18 DIAGNOSIS — H52221 Regular astigmatism, right eye: Secondary | ICD-10-CM | POA: Diagnosis not present

## 2019-01-27 DIAGNOSIS — G4733 Obstructive sleep apnea (adult) (pediatric): Secondary | ICD-10-CM | POA: Diagnosis not present

## 2019-02-05 DIAGNOSIS — G43909 Migraine, unspecified, not intractable, without status migrainosus: Secondary | ICD-10-CM | POA: Diagnosis not present

## 2019-02-05 DIAGNOSIS — I10 Essential (primary) hypertension: Secondary | ICD-10-CM | POA: Diagnosis not present

## 2019-02-05 DIAGNOSIS — E1122 Type 2 diabetes mellitus with diabetic chronic kidney disease: Secondary | ICD-10-CM | POA: Diagnosis not present

## 2019-02-05 DIAGNOSIS — D696 Thrombocytopenia, unspecified: Secondary | ICD-10-CM | POA: Diagnosis not present

## 2019-02-05 DIAGNOSIS — E782 Mixed hyperlipidemia: Secondary | ICD-10-CM | POA: Diagnosis not present

## 2019-02-07 DIAGNOSIS — E782 Mixed hyperlipidemia: Secondary | ICD-10-CM | POA: Diagnosis not present

## 2019-02-07 DIAGNOSIS — E1122 Type 2 diabetes mellitus with diabetic chronic kidney disease: Secondary | ICD-10-CM | POA: Diagnosis not present

## 2019-02-27 DIAGNOSIS — G4733 Obstructive sleep apnea (adult) (pediatric): Secondary | ICD-10-CM | POA: Diagnosis not present

## 2019-02-28 ENCOUNTER — Encounter (INDEPENDENT_AMBULATORY_CARE_PROVIDER_SITE_OTHER): Payer: Self-pay

## 2019-03-02 NOTE — Progress Notes (Signed)
Patient called stating that Dr. Laural Golden has advised he put remicade infusion off at this time.

## 2019-03-04 ENCOUNTER — Encounter (HOSPITAL_COMMUNITY): Admission: RE | Admit: 2019-03-04 | Payer: Medicare Other | Source: Ambulatory Visit

## 2019-03-10 DIAGNOSIS — E782 Mixed hyperlipidemia: Secondary | ICD-10-CM | POA: Diagnosis not present

## 2019-03-10 DIAGNOSIS — I10 Essential (primary) hypertension: Secondary | ICD-10-CM | POA: Diagnosis not present

## 2019-03-17 ENCOUNTER — Other Ambulatory Visit: Payer: Self-pay

## 2019-03-17 NOTE — Patient Outreach (Signed)
[place] [facility]) Care Management  03/17/2019  Manuel Atkins 30-Nov-1932 [id number]  Medication Adherence call to Manuel Atkins HIPPA Compliant Voice message left with a call back number. Manuel Atkins is showing past due under [place].   [place] Management Direct Dial [phone]  Fax [phone] [email or web address]

## 2019-03-28 DIAGNOSIS — E782 Mixed hyperlipidemia: Secondary | ICD-10-CM | POA: Diagnosis not present

## 2019-03-28 DIAGNOSIS — E1122 Type 2 diabetes mellitus with diabetic chronic kidney disease: Secondary | ICD-10-CM | POA: Diagnosis not present

## 2019-03-28 DIAGNOSIS — N183 Chronic kidney disease, stage 3 (moderate): Secondary | ICD-10-CM | POA: Diagnosis not present

## 2019-03-29 DIAGNOSIS — G4733 Obstructive sleep apnea (adult) (pediatric): Secondary | ICD-10-CM | POA: Diagnosis not present

## 2019-03-31 DIAGNOSIS — D696 Thrombocytopenia, unspecified: Secondary | ICD-10-CM | POA: Diagnosis not present

## 2019-03-31 DIAGNOSIS — E1122 Type 2 diabetes mellitus with diabetic chronic kidney disease: Secondary | ICD-10-CM | POA: Diagnosis not present

## 2019-03-31 DIAGNOSIS — Z0001 Encounter for general adult medical examination with abnormal findings: Secondary | ICD-10-CM | POA: Diagnosis not present

## 2019-03-31 DIAGNOSIS — G43909 Migraine, unspecified, not intractable, without status migrainosus: Secondary | ICD-10-CM | POA: Diagnosis not present

## 2019-03-31 DIAGNOSIS — I10 Essential (primary) hypertension: Secondary | ICD-10-CM | POA: Diagnosis not present

## 2019-04-01 ENCOUNTER — Encounter (INDEPENDENT_AMBULATORY_CARE_PROVIDER_SITE_OTHER): Payer: Self-pay

## 2019-04-06 DIAGNOSIS — G44229 Chronic tension-type headache, not intractable: Secondary | ICD-10-CM | POA: Diagnosis not present

## 2019-04-09 DIAGNOSIS — E782 Mixed hyperlipidemia: Secondary | ICD-10-CM | POA: Diagnosis not present

## 2019-04-09 DIAGNOSIS — E1122 Type 2 diabetes mellitus with diabetic chronic kidney disease: Secondary | ICD-10-CM | POA: Diagnosis not present

## 2019-04-19 ENCOUNTER — Other Ambulatory Visit: Payer: Self-pay

## 2019-04-19 NOTE — Patient Outreach (Signed)
[place] [facility]) Care Management  04/19/2019  [name] 1933/08/27 [id number]   Medication Adherence call to Manuel Atkins [place] Identifiers Verify spoke with patient he is due on Rosuvastatin 10 mg patient explain he was taken off for a few month but now doctor put him back on it and has plenty at this time. Manuel Atkins is showing past due under [place].   [place] Management Direct Dial [phone]  Fax [phone] [name]

## 2019-04-24 ENCOUNTER — Encounter (INDEPENDENT_AMBULATORY_CARE_PROVIDER_SITE_OTHER): Payer: Self-pay

## 2019-04-29 DIAGNOSIS — G4733 Obstructive sleep apnea (adult) (pediatric): Secondary | ICD-10-CM | POA: Diagnosis not present

## 2019-05-02 ENCOUNTER — Encounter (INDEPENDENT_AMBULATORY_CARE_PROVIDER_SITE_OTHER): Payer: Self-pay

## 2019-05-02 DIAGNOSIS — Z961 Presence of intraocular lens: Secondary | ICD-10-CM | POA: Diagnosis not present

## 2019-05-10 ENCOUNTER — Encounter (INDEPENDENT_AMBULATORY_CARE_PROVIDER_SITE_OTHER): Payer: Self-pay | Admitting: *Deleted

## 2019-05-17 ENCOUNTER — Telehealth (INDEPENDENT_AMBULATORY_CARE_PROVIDER_SITE_OTHER): Payer: Self-pay | Admitting: *Deleted

## 2019-05-17 NOTE — Telephone Encounter (Signed)
Patient call, he is ready to get his Remicade back on schedule since he hasn't done it since COVID started - he is aware you are off until next week and won't be able to work on it until then

## 2019-05-23 DIAGNOSIS — B351 Tinea unguium: Secondary | ICD-10-CM | POA: Diagnosis not present

## 2019-05-23 DIAGNOSIS — L110 Acquired keratosis follicularis: Secondary | ICD-10-CM | POA: Diagnosis not present

## 2019-05-23 DIAGNOSIS — E1140 Type 2 diabetes mellitus with diabetic neuropathy, unspecified: Secondary | ICD-10-CM | POA: Diagnosis not present

## 2019-05-25 NOTE — Telephone Encounter (Signed)
I will review is current PA to see if it needs to be updated. I will send a new order to [facility] if needed. Patient will be made aware.

## 2019-05-29 DIAGNOSIS — G4733 Obstructive sleep apnea (adult) (pediatric): Secondary | ICD-10-CM | POA: Diagnosis not present

## 2019-05-31 DIAGNOSIS — G4733 Obstructive sleep apnea (adult) (pediatric): Secondary | ICD-10-CM | POA: Diagnosis not present

## 2019-05-31 NOTE — Telephone Encounter (Signed)
Last seen 10/2018 , please set up for a video visit with APP today

## 2019-05-31 NOTE — Telephone Encounter (Signed)
Received the following email from patient:   "Dr Vaughan Browner, I am experiencing really bad breathing problems. I expect the hot and humid weather has something to do with it. I would have to say there are times I would call my situation moderately severe. I have not felt that I might pass out, but there are times when I feel I can not take another step. I have a fairly consistent cough but no temperature or blood pressure problems. I feel if I am able to function anything like normal I may need some oxygen at times. I have stayed close to home and do not go in crowds, so I don't see COVID-19 as a problem. Be glad to come in to see you if you think I should.  Thank you, Manuel Atkins"  He normally sees Dr. Vaughan Browner for IPF and OSA. He stated that he is not concerned about COVID, but he has a cough and increased SOB (which are symptoms of COVID). Dr. Vaughan Browner is not scheduled to be back in the office until last next week.   TP, do you think it would be safe for him to come into the office to see someone or will be it best for him to get scheduled for a televisit? Thanks!

## 2019-06-01 DIAGNOSIS — L570 Actinic keratosis: Secondary | ICD-10-CM | POA: Diagnosis not present

## 2019-06-10 DIAGNOSIS — I10 Essential (primary) hypertension: Secondary | ICD-10-CM | POA: Diagnosis not present

## 2019-06-10 DIAGNOSIS — E782 Mixed hyperlipidemia: Secondary | ICD-10-CM | POA: Diagnosis not present

## 2019-06-16 ENCOUNTER — Ambulatory Visit (INDEPENDENT_AMBULATORY_CARE_PROVIDER_SITE_OTHER): Payer: Medicare Other | Admitting: Adult Health

## 2019-06-16 ENCOUNTER — Other Ambulatory Visit: Payer: Medicare Other

## 2019-06-16 ENCOUNTER — Encounter: Payer: Self-pay | Admitting: Adult Health

## 2019-06-16 ENCOUNTER — Other Ambulatory Visit: Payer: Self-pay

## 2019-06-16 DIAGNOSIS — R0609 Other forms of dyspnea: Secondary | ICD-10-CM | POA: Diagnosis not present

## 2019-06-16 DIAGNOSIS — R0602 Shortness of breath: Secondary | ICD-10-CM | POA: Diagnosis not present

## 2019-06-16 DIAGNOSIS — J849 Interstitial pulmonary disease, unspecified: Secondary | ICD-10-CM | POA: Diagnosis not present

## 2019-06-16 DIAGNOSIS — G4733 Obstructive sleep apnea (adult) (pediatric): Secondary | ICD-10-CM | POA: Diagnosis not present

## 2019-06-16 NOTE — Patient Instructions (Signed)
Continue on current regimen. Mucinex DM as needed for cough and congestion Follow-up in 1 week with labs and chest x-ray

## 2019-06-16 NOTE — Progress Notes (Signed)
Virtual Visit via Telephone Note  I connected with Manuel Atkins on 06/16/19 at  9:00 AM EDT by telephone and verified that I am speaking with the correct person using two identifiers.  Location: Patient: Home  Provider: Office    I discussed the limitations, risks, security and privacy concerns of performing an evaluation and management service by telephone and the availability of in person appointments. I also discussed with the patient that there may be a patient responsible charge related to this service. The patient expressed understanding and agreed to proceed.   History of Present Illness: Today's tele-visit is a follow up office visit for dyspnea  Patient is an 85 year old male former smoker followed for bronchiectasis and possible interstitial lung disease.  Risk factors for interstitial lung disease are occupational and also underlying autoimmune with ulcerative colitis and previous treatment with infliximab, meslamine or chronic aspiration from esophageal stricture (Occupational exposure to cotton dust, wood dust and possible asbestos) .  Medical history significant for ulcerative colitis   Patient is here for a follow-up.  Has known bronchiectasis and suspected interstitial lung disease on CT scan.  Serial CT scans have been stable with bronchiectasis and interstitial changes.  Patient is suspected to have underlying interstitial lung disease with basilar predominant lung fibrosis.  He does have some occupational risk factors along with autoimmune as he is ulcerative colitis.  Also has chronic dysphasia from previous esophageal stricture so aspiration could be also be a contributing factor.  Patient has had chronic dyspnea mainly with activity.  He says over the last 6 months feels that his shortness of breath is slowly getting worse.  He gets winded with heavy activity.  Does have intermittent cough.  No fever, orthopnea or increased leg swelling. He has done pulmonary rehab finished  in 2019.  Says it feels like that this helped his strength a little bit not sure it helped his shortness of breath.   Previously tried on Symbicort with no perceived clinical benefit Has checked oxygen levels at home with levels above 90% at rest.    Says his ulcerative colitis has been under excellent control.   Patient is on BiPAP.  Says he wears it each night.  Has known reflux.  Previous esophageal stricture with dilatation.  Says overall swallowing is okay.  He does get choked on big pills.  No overt reflux. Managed on PPI therapy  Observations/Objective:  CT scan 07/27/2008-mild basal atelectasis right upper lobe pulmonary embolism CT scan 11/16/17-mild bronchiectasis, basal reticulation right greater than left.  Borderline right hilar lymphadenopathy.  Nodular liver contour possible cirrhosis High-resolution CT 01/26/18- patchy ground glass attenuation, mild bronchiectasis, septal thickening with no basal gradient.  No honeycombing. Indeterminate for UIP.  Hepatic steatosis, aortic atherosclerosis, left main and left anterior coronary artery disease. High-resolution CT 05/04/2018-mild basal predominant lung fibrosis stable from prior exam. High-resolution CT 10/18/2018-stable interstitial lung disease I have reviewed the images personally.  Barium swallow 05/31/13- mild impairment of esophageal motility, stricture at GE junction with obstruction of barium tablet.  Labs Connective tissue serologies 11/23/17-ANA, ACE, CCP, rheumatoid factor all negative Hypersenitivity panel-negative  PFTs  12/23/17 FVC 2.30 (54%), FEV1 1.97 [66%], F/F 86, TLC 64%, DLCO 42% No obstruction, moderate restriction with severe diffusion defect  05/18/2018 FVC 2.32 [55%), FEV1 1.91 [65%], F/F 82, DLCO 43% Moderate restriction, severe diffusion defect.  10/20/2018 FVC 2.30 [55%], FEV1 1.96 [6 6%], F/F 85, DLCO 46% Moderate restriction, severe diffusion defect.  6-minute walk 03/03/18- 338 m 6-minute  walk 05/20/18- 288 m 6-minute walk 10/27/18- 363 m  Cardiac RHC 04/20/18 RA: 9 mmHg RV: 29/7 mmHg PA: 29/12 mmHg, mean PAP 19 mmHg PCWP: 9 mmHg LVEDP: 14 mmHg  CO: 4.8 L/min CI: 2.3 L/min/m2  Sleep Received copy of sleep study from [facility] pulmonary, 08/01/2011 BiPAP titration.  Titrated to IPAP 17, EPAP 13.  Severe PLM's noted.  Consider treatment for restless leg syndrome.  Assessment and Plan: Bronchiectasis and interstitial lung disease-check chest x-ray on return visit next week  No symptoms of acute exacerbation..  Chronic dyspnea suspect is multifactorial with underlying interstitial lung disease and multiple comorbidities. Will check chest x-ray and labs on return.  Check walking O2 saturations on return visit  Obstructive sleep apnea on BiPAP Bring SD card to next visit first BiPAP download  Dysphagia/GERD-continue on Protonix.  GERD and aspiration precautions  Plan  Patient Instructions  Continue on current regimen. Mucinex DM as needed for cough and congestion Follow-up in 1 week with labs and chest x-ray       Follow Up Instructions: Follow up in 1 week with chest xray and labs.     I discussed the assessment and treatment plan with the patient. The patient was provided an opportunity to ask questions and all were answered. The patient agreed with the plan and demonstrated an understanding of the instructions.   The patient was advised to call back or seek an in-person evaluation if the symptoms worsen or if the condition fails to improve as anticipated.  I provided 22 minutes of non-face-to-face time during this encounter.   Rexene Edison, NP

## 2019-06-20 DIAGNOSIS — B880 Other acariasis: Secondary | ICD-10-CM | POA: Diagnosis not present

## 2019-06-23 ENCOUNTER — Ambulatory Visit (INDEPENDENT_AMBULATORY_CARE_PROVIDER_SITE_OTHER): Payer: Medicare Other

## 2019-06-23 ENCOUNTER — Encounter: Payer: Self-pay | Admitting: Adult Health

## 2019-06-23 ENCOUNTER — Other Ambulatory Visit: Payer: Self-pay

## 2019-06-23 ENCOUNTER — Ambulatory Visit (INDEPENDENT_AMBULATORY_CARE_PROVIDER_SITE_OTHER): Payer: Medicare Other | Admitting: Adult Health

## 2019-06-23 DIAGNOSIS — R0602 Shortness of breath: Secondary | ICD-10-CM | POA: Diagnosis not present

## 2019-06-23 DIAGNOSIS — J849 Interstitial pulmonary disease, unspecified: Secondary | ICD-10-CM | POA: Diagnosis not present

## 2019-06-23 DIAGNOSIS — G4733 Obstructive sleep apnea (adult) (pediatric): Secondary | ICD-10-CM | POA: Diagnosis not present

## 2019-06-23 DIAGNOSIS — R0609 Other forms of dyspnea: Secondary | ICD-10-CM

## 2019-06-23 LAB — CBC WITH DIFFERENTIAL/PLATELET
Basophils Absolute: 0.1 10*3/uL (ref 0.0–0.1)
Basophils Relative: 1.1 % (ref 0.0–3.0)
Eosinophils Absolute: 0.1 10*3/uL (ref 0.0–0.7)
Eosinophils Relative: 1.2 % (ref 0.0–5.0)
HCT: 41.9 % (ref 39.0–52.0)
Hemoglobin: 14.3 g/dL (ref 13.0–17.0)
Lymphocytes Relative: 22.8 % (ref 12.0–46.0)
Lymphs Abs: 1.8 10*3/uL (ref 0.7–4.0)
MCHC: 34.2 g/dL (ref 30.0–36.0)
MCV: 95.6 fl (ref 78.0–100.0)
Monocytes Absolute: 0.8 10*3/uL (ref 0.1–1.0)
Monocytes Relative: 10.6 % (ref 3.0–12.0)
Neutro Abs: 5 10*3/uL (ref 1.4–7.7)
Neutrophils Relative %: 64.3 % (ref 43.0–77.0)
Platelets: 180 10*3/uL (ref 150.0–400.0)
RBC: 4.38 Mil/uL (ref 4.22–5.81)
RDW: 13.1 % (ref 11.5–15.5)
WBC: 7.8 10*3/uL (ref 4.0–10.5)

## 2019-06-23 LAB — COMPREHENSIVE METABOLIC PANEL WITH GFR
ALT: 35 U/L (ref 0–53)
AST: 31 U/L (ref 0–37)
Albumin: 4.9 g/dL (ref 3.5–5.2)
Alkaline Phosphatase: 57 U/L (ref 39–117)
BUN: 34 mg/dL — ABNORMAL HIGH (ref 6–23)
CO2: 25 meq/L (ref 19–32)
Calcium: 9.6 mg/dL (ref 8.4–10.5)
Chloride: 103 meq/L (ref 96–112)
Creatinine, Ser: 1.54 mg/dL — ABNORMAL HIGH (ref 0.40–1.50)
GFR: 43.25 mL/min — ABNORMAL LOW
Glucose, Bld: 163 mg/dL — ABNORMAL HIGH (ref 70–99)
Potassium: 4.3 meq/L (ref 3.5–5.1)
Sodium: 138 meq/L (ref 135–145)
Total Bilirubin: 1.1 mg/dL (ref 0.2–1.2)
Total Protein: 8.4 g/dL — ABNORMAL HIGH (ref 6.0–8.3)

## 2019-06-23 LAB — BRAIN NATRIURETIC PEPTIDE: Pro B Natriuretic peptide (BNP): 30 pg/mL (ref 0.0–100.0)

## 2019-06-23 NOTE — Assessment & Plan Note (Signed)
ILD changes on CT - stable 10/2018 . Decreased activity tolerance /Progressive DOE suspect is multifactoral with multiple comorbdities along with ILD .  Check labs today .  Walk test in office today with no desats.   Plan  Patient Instructions  May try ProAir 2 puffs every 4hrs as needed for shortness of breath /wheezing .  Continue on BIPAP At bedtime  .  Keep up good work.  Do not drive if sleepy  Activity as tolerated.  Mucinex DM as needed for cough and congestion Follow up in 4 months with Dr. Vaughan Browner or [place] NP with PFT and 6 min walk test .

## 2019-06-23 NOTE — Progress Notes (Signed)
@Patient  ID: Manuel Atkins, male    DOB: Aug 28, 1933, 85 y.o.   MRN: [id number]  Chief Complaint  Patient presents with  . Follow-up    Bronchiectasis     Referring provider: Caryl Bis, MD  HPI: 85 year old male former smoker followed for bronchiectasis possible interstitial lung disease.  interstitial lung disease risk factors include occupational, underlying autoimmune with ulcerative colitis, previous treatment with infliximab, mesalamine, or chronic aspiration with esophageal stricture.  His occupational exposure was to cotton dust, wood dust and possible asbestos. Medical history is significant for ulcerative colitis, obstructive sleep apnea on BiPAP   TEST/EVENTS :  CT scan 07/27/2008-mild basal atelectasis right upper lobe pulmonary embolism CT scan 11/16/17-mild bronchiectasis, basal reticulation right greater than left. Borderline right hilar lymphadenopathy. Nodular liver contour possible cirrhosis High-resolution CT 01/26/18-patchy ground glass attenuation, mild bronchiectasis, septal thickening with no basal gradient. No honeycombing. Indeterminate for UIP. Hepatic steatosis, aortic atherosclerosis, left main and left anterior coronary artery disease. High-resolution CT 05/04/2018-mild basal predominant lung fibrosis stable from prior exam. High-resolution CT12/07/2018-stable interstitial lung disease I have reviewed the images personally.  Barium swallow 05/31/13-mild impairment of esophageal motility, stricture at GE junction with obstruction of barium tablet.  Labs Connective tissue serologies 11/23/17-ANA, ACE, CCP, rheumatoid factor all negative Hypersenitivity panel-negative  PFTs 12/23/17 FVC 2.30 (54%), FEV1 1.97 [66%], F/F 86, TLC 64%, DLCO 42% No obstruction, moderate restriction with severe diffusion defect  05/18/2018 FVC 2.32 [55%), FEV1 1.91 [65%], F/F 82, DLCO 43% Moderate restriction, severe diffusion defect.  10/20/2018 FVC 2.30 [55%], FEV1  1.96 [6 6%], F/F 85, DLCO 46% Moderate restriction, severe diffusion defect.  6-minute walk 03/03/18-338 m 6-minute walk 05/20/18-288 m 6-minute walk 10/27/18- 363 m  Cardiac RHC 04/20/18 RA: 9 mmHg RV: 29/7 mmHg PA: 29/12 mmHg, mean PAP 19 mmHg PCWP: 9 mmHg LVEDP: 14 mmHg  CO: 4.8 L/min CI: 2.3 L/min/m2  Sleep Received copy of sleep study from [facility], 08/01/2011 BiPAP titration. Titrated to IPAP 17, EPAP 13. Severe PLM's noted. Consider treatment for restless leg syndrome.  06/23/2019 Follow up : Bronchiectasis, dyspnea, obstructive sleep apnea Patient returns for a one-week follow-up.  Patient has some known underlying mild bronchiectasis noted on CT chest.  High-resolution CT chest December 2019 showed stable interstitial lung disease changes.  (Patchy peripheral predominant areas of septal thickening, minimal peribronchial groundglass attenuation and scattered areas of mild cylindrical bronchiectasis )  Last visit patient was complaining of worsening shortness of breath over the last 6 months.  Occurs mostly with activity.  Feels that he has decreased activity tolerance.  Has intermittent dry cough. Most concern is that is not able to be as active as he used to . Was able to plant garden and now it is too hard for him.   Patient has underlying obstructive sleep apnea is on nocturnal BiPAP.  He is on IPAP 14, EPAP 10 cm H2O.  BiPAP download shows excellent compliance with 97% usage at 9.5 hours.  AHI 4.6. feels he gets good night sleep but still feels tired during day.     Allergies  Allergen Reactions  . Mercaptopurine Other (See Comments)    High Fever, Chills, Fatigue    Immunization History  Administered Date(s) Administered  . Influenza, High Dose Seasonal PF 07/19/2018  . Influenza-Unspecified 10/09/2017  . PPD Test 11/01/2012  . Pneumococcal Polysaccharide-23 11/11/2007  . Tdap 11/11/2007    Past Medical History:  Diagnosis Date  . 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 Dyspnea   .  Essential hypertension, benign   . GERD (gastroesophageal reflux disease)   . Headache(784.0)   . History of colon polyps   . History of transfusion of whole blood   . Interstitial lung disease ([place]) 12/01/2017  . Mixed hyperlipidemia   . Obstructive sleep apnea (adult) (pediatric)    uses bipap @ HS  . Other malaise and fatigue   . Thrombocytopenia ([place]) 11/10/2012  . Type II or unspecified type diabetes mellitus without mention of complication, uncontrolled   . 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HCC)     Tobacco History: Social History   Tobacco Use  Smoking Status Former Smoker  . Years: 20.00  . Types: Pipe, Cigars  . Quit date: 11/10/1968  . Years since quitting: 50.6  Smokeless Tobacco Never Used   Counseling given: Not Answered   Outpatient Medications Prior to Visit  Medication Sig Dispense Refill  . acetaminophen (TYLENOL) 500 MG tablet Take 1,000 mg by mouth every 6 (six) hours as needed for moderate pain or headache.    . ALPRAZolam (XANAX) 0.25 MG tablet Take 0.5 tablets (0.125 mg total) by mouth 3 (three) times daily as needed for anxiety. for anxiety 135 tablet 1  . aspirin EC 81 MG tablet Take 81 mg by mouth daily.    Marland Kitchen FLOMAX 0.4 MG CAPS capsule     . glipiZIDE (GLUCOTROL XL) 2.5 MG 24 hr tablet     . imipramine (TOFRANIL) 25 MG tablet Take 75 mg by mouth at bedtime  0  . inFLIXimab in sodium chloride 0.9 % Inject 5 mg/kg into the vein every 8 (eight) weeks.    Marland Kitchen latanoprost (XALATAN) 0.005 % ophthalmic solution Place 1 drop into both eyes at bedtime.  6  . losartan (COZAAR) 100 MG tablet     . metFORMIN (GLUCOPHAGE-XR) 500 MG 24 hr tablet Take 1,500 mg by mouth at bedtime.   1  . nitroGLYCERIN (NITROSTAT) 0.4 MG SL tablet Place 0.4 mg under the tongue every 5 (five) minutes as needed for chest pain.   1  . pantoprazole (PROTONIX) 40 MG tablet Take 1 tablet (40 mg total) by mouth every other day. 45 tablet 3  . Probiotic  Product (PROBIOTIC DAILY PO) Take 1 capsule by mouth daily.     . rosuvastatin (CRESTOR) 10 MG tablet TAKE ONE TABLET BY MOUTH DAILY AT NIGHT     No facility-administered medications prior to visit.      Review of Systems:   Constitutional:   No  weight loss, night sweats,  Fevers, chills, + fatigue, or  lassitude.  HEENT:   No headaches,  Difficulty swallowing,  Tooth/dental problems, or  Sore throat,                No sneezing, itching, ear ache, nasal congestion, post nasal drip,   CV:  No chest pain,  Orthopnea, PND, swelling in lower extremities, anasarca, dizziness, palpitations, syncope.   GI  No heartburn, indigestion, abdominal pain, nausea, vomiting, diarrhea, change in bowel habits, loss of appetite, bloody stools.   Resp:    No excess mucus, no productive cough,  No non-productive cough,  No coughing up of blood.  No change in color of mucus.  No wheezing.  No chest wall deformity  Skin: no rash or lesions.  GU: no dysuria, change in color of urine, no urgency or frequency.  No flank pain, no hematuria   MS:  No joint pain or swelling.  No decreased range of motion.  No back  pain.    Physical Exam  BP 138/64 (BP Location: Left Arm, Cuff Size: Normal)   Pulse 81   Temp 97.7 F (36.5 C) (Oral)   Ht 5' 11.5" (1.816 m)   Wt 204 lb (92.5 kg)   SpO2 98%   BMI 28.06 kg/m   GEN: A/Ox3; pleasant , NAD, elderly    HEENT:  [place]/AT,    NOSE-clear, THROAT-clear, no lesions, no postnasal drip or exudate noted.   NECK:  Supple w/ fair ROM; no JVD; normal carotid impulses w/o bruits; no thyromegaly or nodules palpated; no lymphadenopathy.    RESP  BB crackles   no accessory muscle use, no dullness to percussion  CARD:  RRR, no m/r/g, no peripheral edema, pulses intact, no cyanosis or clubbing.  GI:   Soft & nt; nml bowel sounds; no organomegaly or masses detected.   Musco: Warm bil, no deformities or joint swelling noted.   Neuro: alert, no focal deficits noted.     Skin: Warm, no lesions or rashes    Lab Results:  CBC  BNP No results found for: BNP  ProBNP No results found for: PROBNP  Imaging: No results found.    PFT Results Latest Ref Rng & Units 10/20/2018 05/20/2018 12/23/2017  FVC-Pre L 2.30 2.32 2.44  FVC-Predicted Pre % 55 55 58  FVC-Post L - - 2.30  FVC-Predicted Post % - - 54  Pre FEV1/FVC % % 85 82 82  Post FEV1/FCV % % - - 86  FEV1-Pre L 1.96 1.91 2.01  FEV1-Predicted Pre % 66 65 67  FEV1-Post L - - 1.97  DLCO UNC% % 46 43 43  DLCO COR %Predicted % 91 79 75  TLC L - - 4.77  TLC % Predicted % - - 64  RV % Predicted % - - 75    No results found for: NITRICOXIDE      Assessment & Plan:   ILD (interstitial lung disease) (HCC) ILD changes on CT - stable 10/2018 . Decreased activity tolerance /Progressive DOE suspect is multifactoral with multiple comorbdities along with ILD .  Check labs today .  Walk test in office today with no desats.   Plan  Patient Instructions  May try ProAir 2 puffs every 4hrs as needed for shortness of breath /wheezing .  Continue on BIPAP At bedtime  .  Keep up good work.  Do not drive if sleepy  Activity as tolerated.  Mucinex DM as needed for cough and congestion Follow up in 4 months with Dr. Vaughan Browner or Parrett NP with PFT and 6 min walk test .        OSA (obstructive sleep apnea) Cont on BIPAP  execellent control and compliance   Plan  Patient Instructions  May try ProAir 2 puffs every 4hrs as needed for shortness of breath /wheezing .  Continue on BIPAP At bedtime  .  Keep up good work.  Do not drive if sleepy  Activity as tolerated.  Mucinex DM as needed for cough and congestion Follow up in 4 months with Dr. Vaughan Browner or Parrett NP with PFT and 6 min walk test .        Dyspnea Progressive DOE suspect is multifactoral with ILD , co-morbidities .  Will check PFT and 6 min walk test on return  CXR and labs today   Plan  Patient Instructions  May try ProAir 2  puffs every 4hrs as needed for shortness of breath /wheezing .  Continue on BIPAP At  bedtime  .  Keep up good work.  Do not drive if sleepy  Activity as tolerated.  Mucinex DM as needed for cough and congestion Follow up in 3-4  months with Dr. Vaughan Browner or Parrett NP with PFT and 6 min walk test .         Rexene Edison, NP 06/23/2019

## 2019-06-23 NOTE — Assessment & Plan Note (Signed)
Cont on BIPAP  execellent control and compliance   Plan  Patient Instructions  May try ProAir 2 puffs every 4hrs as needed for shortness of breath /wheezing .  Continue on BIPAP At bedtime  .  Keep up good work.  Do not drive if sleepy  Activity as tolerated.  Mucinex DM as needed for cough and congestion Follow up in 4 months with Dr. Vaughan Browner or Marion Seese NP with PFT and 6 min walk test .

## 2019-06-23 NOTE — Assessment & Plan Note (Signed)
Progressive DOE suspect is multifactoral with ILD , co-morbidities .  Will check PFT and 6 min walk test on return  CXR and labs today   Plan  Patient Instructions  May try ProAir 2 puffs every 4hrs as needed for shortness of breath /wheezing .  Continue on BIPAP At bedtime  .  Keep up good work.  Do not drive if sleepy  Activity as tolerated.  Mucinex DM as needed for cough and congestion Follow up in 3-4  months with Dr. Vaughan Browner or Siraj Dermody NP with PFT and 6 min walk test .

## 2019-06-23 NOTE — Patient Instructions (Addendum)
May try ProAir 2 puffs every 4hrs as needed for shortness of breath /wheezing .  Continue on BIPAP At bedtime  .  Keep up good work.  Do not drive if sleepy  Activity as tolerated.  Mucinex DM as needed for cough and congestion Follow up in 3-4  months with Dr. Vaughan Browner or Latron Ribas NP with PFT and 6 min walk test .

## 2019-06-24 ENCOUNTER — Encounter (HOSPITAL_COMMUNITY): Payer: Self-pay

## 2019-06-24 ENCOUNTER — Encounter (HOSPITAL_COMMUNITY)
Admission: RE | Admit: 2019-06-24 | Discharge: 2019-06-24 | Disposition: A | Discharge status: Home / Self Care | Payer: Medicare Other | Source: Ambulatory Visit | Attending: Internal Medicine | Admitting: Internal Medicine

## 2019-06-24 ENCOUNTER — Other Ambulatory Visit: Payer: Self-pay

## 2019-06-24 DIAGNOSIS — K5190 Ulcerative colitis, unspecified, without complications: Secondary | ICD-10-CM | POA: Diagnosis not present

## 2019-06-24 MED ORDER — SODIUM CHLORIDE 0.9 % IV SOLN
Freq: Once | INTRAVENOUS | Status: AC
  Administered 2019-06-24: 250 mL via INTRAVENOUS

## 2019-06-24 MED ORDER — INFLIXIMAB 100 MG IV SOLR
500.0000 mg | INTRAVENOUS | Status: DC
  Administered 2019-06-24: 500 mg via INTRAVENOUS
  Filled 2019-06-24: qty 50

## 2019-06-24 MED ORDER — ACETAMINOPHEN 325 MG PO TABS: 650.0000 mg | ORAL_TABLET | Freq: Once | ORAL | Status: DC

## 2019-06-24 MED ORDER — DIPHENHYDRAMINE HCL 25 MG PO CAPS
50.0000 mg | ORAL_CAPSULE | Freq: Once | ORAL | Status: DC
Start: 2019-06-24 — End: 2019-06-25

## 2019-06-24 MED ORDER — LORATADINE 10 MG PO TABS: 10.0000 mg | ORAL_TABLET | Freq: Once | ORAL | Status: DC

## 2019-06-27 ENCOUNTER — Telehealth: Payer: Self-pay | Admitting: Adult Health

## 2019-06-27 ENCOUNTER — Other Ambulatory Visit: Payer: Self-pay

## 2019-06-27 DIAGNOSIS — J849 Interstitial pulmonary disease, unspecified: Secondary | ICD-10-CM

## 2019-06-27 DIAGNOSIS — R0609 Other forms of dyspnea: Secondary | ICD-10-CM

## 2019-06-27 MED ORDER — ALBUTEROL SULFATE HFA 108 (90 BASE) MCG/ACT IN AERS: 1.0000 | INHALATION_SPRAY | Freq: Four times a day (QID) | RESPIRATORY_TRACT | 2 refills | Status: DC | PRN

## 2019-06-27 NOTE — Telephone Encounter (Signed)
Patient was seen by TP on 06/23/19 for an OV. She would like for patient to have a PFT (Spirometry and DLCO) in 3  months. Per patient's chart, he has already been scheduled for a OV on 11/13.   Will forward to [facility] for follow up on PFT. Thanks!

## 2019-06-28 NOTE — Telephone Encounter (Signed)
TP please clarify.

## 2019-06-28 NOTE — Telephone Encounter (Signed)
Called patient and went over chest xray results with stable ILD changes.

## 2019-06-29 DIAGNOSIS — G4733 Obstructive sleep apnea (adult) (pediatric): Secondary | ICD-10-CM | POA: Diagnosis not present

## 2019-06-30 NOTE — Telephone Encounter (Signed)
Attempted to call pt to schedule pft -no answer and no vm -pr

## 2019-07-01 NOTE — Telephone Encounter (Signed)
Scheduled patient prior to OV with TP on 10/24/2019 that was previously scheduled -pr

## 2019-07-19 ENCOUNTER — Other Ambulatory Visit: Payer: Self-pay

## 2019-07-19 ENCOUNTER — Ambulatory Visit (INDEPENDENT_AMBULATORY_CARE_PROVIDER_SITE_OTHER): Payer: Medicare Other | Admitting: Internal Medicine

## 2019-07-19 ENCOUNTER — Telehealth: Payer: Self-pay | Admitting: Adult Health

## 2019-07-19 ENCOUNTER — Encounter (INDEPENDENT_AMBULATORY_CARE_PROVIDER_SITE_OTHER): Payer: Self-pay | Admitting: Internal Medicine

## 2019-07-19 VITALS — BP 132/76 | HR 84 | Temp 97.7°F | Ht 72.0 in | Wt 205.3 lb

## 2019-07-19 DIAGNOSIS — K5100 Ulcerative (chronic) pancolitis without complications: Secondary | ICD-10-CM | POA: Diagnosis not present

## 2019-07-19 DIAGNOSIS — N184 Chronic kidney disease, stage 4 (severe): Secondary | ICD-10-CM | POA: Diagnosis not present

## 2019-07-19 DIAGNOSIS — K219 Gastro-esophageal reflux disease without esophagitis: Secondary | ICD-10-CM | POA: Diagnosis not present

## 2019-07-19 NOTE — Telephone Encounter (Signed)
Called and spoke with pt to let him know his 6 minute walk test is an outpatient test only and must be performed at the [facility] office. Pt verbalized understanding with no additional questions. Nothing further needed at this time.

## 2019-07-19 NOTE — Progress Notes (Signed)
Presenting complaint;  Follow-up for ulcerative colitis and chronic GERD.  Database and subjective:  Patient is 85 year old Caucasian male with a 7-year history of ulcerative colitis presently maintained on infliximab for maintenance as well as history of GERD complicated by esophageal stricture who is here for scheduled visit.  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 Esophageal stricture was initially dilated in August 2014 and more recently in February 2019.  His last visit was in March 2020. He now returns for scheduled visit.  He has no GI complaints.  His bowels move every day.  He passes formed stool.  He denies abdominal pain melena or rectal bleeding.  He has heartburn occasionally with certain foods.  He experiences occasional solid food dysphagia but has not progressed.  He has not had any episodes of food impaction.  He feels PPI every other day is adequate.  He has lost 4 pounds since his last visit.  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 He continues to experience dyspnea with minimal exertion.  He remains under care of Dr. Vaughan Browner of [facility].  Current Medications: Outpatient Encounter Medications as of 07/19/2019  Medication Sig  . acetaminophen (TYLENOL) 500 MG tablet Take 1,000 mg by mouth every 6 (six) hours as needed for moderate pain or headache.  . albuterol (PROAIR HFA) 108 (90 Base) MCG/ACT inhaler Inhale 1-2 puffs into the lungs every 6 (six) hours as needed for wheezing or shortness of breath.  . ALPRAZolam (XANAX) 0.25 MG tablet Take 0.5 tablets (0.125 mg total) by mouth 3 (three) times daily as needed for anxiety. for anxiety  . aspirin EC 81 MG tablet Take 81 mg by mouth daily.  Marland Kitchen glipiZIDE (GLUCOTROL XL) 2.5 MG 24 hr tablet Take 2.5 mg by mouth daily with breakfast.   . imipramine (TOFRANIL)  25 MG tablet Take 75 mg by mouth at bedtime  . inFLIXimab in sodium chloride 0.9 % Inject 5 mg/kg into the vein every 8 (eight) weeks.  Marland Kitchen latanoprost (XALATAN) 0.005 % ophthalmic solution Place 1 drop into both eyes at bedtime.  Marland Kitchen losartan (COZAAR) 100 MG tablet Take 100 mg by mouth daily.   . metFORMIN (GLUCOPHAGE-XR) 500 MG 24 hr tablet Take 1,500 mg by mouth at bedtime. Patient takes 500 mg in the morning and 1000 mg at night.  . nitroGLYCERIN (NITROSTAT) 0.4 MG SL tablet Place 0.4 mg under the tongue every 5 (five) minutes as needed for chest pain.   . pantoprazole (PROTONIX) 40 MG tablet Take 1 tablet (40 mg total) by mouth every other day.  . Probiotic Product (PROBIOTIC DAILY PO) Take 1 capsule by mouth daily.   . rosuvastatin (CRESTOR) 10 MG tablet TAKE ONE TABLET BY MOUTH DAILY AT NIGHT  . FLOMAX 0.4 MG CAPS capsule    No facility-administered encounter medications on file as of 07/19/2019.      Objective: Blood pressure 132/76, pulse 84, temperature 97.7 F (36.5 C), temperature source Oral, height 6' (1.829 m), weight 205 lb 4.8 oz (93.1 kg). Patient is alert and in no acute distress. He is wearing facial mask. Conjunctiva is pink. Sclera is nonicteric Oropharyngeal mucosa is normal. No neck masses or thyromegaly noted. Cardiac exam with regular rhythm normal S1 and S2.  He has faint systolic murmur best heard at aortic area. Lungs are clear  to auscultation. Abdomen is full but soft and nontender with organomegaly or masses. No LE edema or clubbing noted.  Labs/studies Results:  CBC Latest Ref Rng & Units 06/23/2019 03/05/2018 03/28/2013  WBC 4.0 - 10.5 K/uL 7.8 6.5 9.6  Hemoglobin 13.0 - 17.0 g/dL 14.3 15.3 15.2  Hematocrit 39.0 - 52.0 % 41.9 43.8 44.3  Platelets 150.0 - 400.0 K/uL 180.0 178.0 206    CMP Latest Ref Rng & Units 06/23/2019 11/15/2012 11/09/2012  Glucose 70 - 99 mg/dL 163(H) - 167(H)  BUN 6 - 23 mg/dL 34(H) - 15  Creatinine 0.40 - 1.50 mg/dL 1.54(H) - 0.78   Sodium 135 - 145 mEq/L 138 - 136  Potassium 3.5 - 5.1 mEq/L 4.3 - 4.9  Chloride 96 - 112 mEq/L 103 - 107  CO2 19 - 32 mEq/L 25 - 23  Calcium 8.4 - 10.5 mg/dL 9.6 9.6 7.9(L)  Total Protein 6.0 - 8.3 g/dL 8.4(H) - -  Total Bilirubin 0.2 - 1.2 mg/dL 1.1 - -  Alkaline Phos 39 - 117 U/L 57 - -  AST 0 - 37 U/L 31 - -  ALT 0 - 53 U/L 35 - -    Hepatic Function Latest Ref Rng & Units 06/23/2019 11/08/2012 09/24/2012  Total Protein 6.0 - 8.3 g/dL 8.4(H) 6.6 6.9  Albumin 3.5 - 5.2 g/dL 4.9 2.7(L) 3.6  AST 0 - 37 U/L _0 ALT 0 - 53 U/L 35 24 39  Alk Phosphatase 39 - 117 U/L 57 52 52  Total Bilirubin 0.2 - 1.2 mg/dL 1.1 0.9 1.2     Assessment:  #1.  Chronic ulcerative colitis.  Patient remains on infliximab for maintenance and appears to be in remission.  He also had been on oral mesalamine at one point but was discontinued as there was concern for pulmonary injury but stopping the medication did not make any difference.  He is not having any side effects with infliximab.  He has not undergone surveillance exam because of his age.  #2.  Chronic GERD complicated by esophageal stricture which was last dilated in February 2019.  He appears to be doing well with antireflux measures and PPI every other day.  #3.  Recent lab studies reviewed.  Serum creatinine is elevated but has been elevated in the past as well.  It was 1.62 on 11/16/2018. He may have chronic kidney disease to be followed by Dr. Gar Ponto.   Plan:  Patient will continue infliximab infusion every 8 weeks for maintenance. Patient advised to call office should dysphagia worsens. Office visit in 6 months.

## 2019-07-19 NOTE — Patient Instructions (Signed)
Please remember to discuss results of serum creatinine with Dr. Gar Ponto.

## 2019-07-20 DIAGNOSIS — R5383 Other fatigue: Secondary | ICD-10-CM | POA: Diagnosis not present

## 2019-07-20 DIAGNOSIS — E1122 Type 2 diabetes mellitus with diabetic chronic kidney disease: Secondary | ICD-10-CM | POA: Diagnosis not present

## 2019-07-20 DIAGNOSIS — E1165 Type 2 diabetes mellitus with hyperglycemia: Secondary | ICD-10-CM | POA: Diagnosis not present

## 2019-07-20 DIAGNOSIS — G4733 Obstructive sleep apnea (adult) (pediatric): Secondary | ICD-10-CM | POA: Diagnosis not present

## 2019-07-20 DIAGNOSIS — K7581 Nonalcoholic steatohepatitis (NASH): Secondary | ICD-10-CM | POA: Diagnosis not present

## 2019-07-20 DIAGNOSIS — E782 Mixed hyperlipidemia: Secondary | ICD-10-CM | POA: Diagnosis not present

## 2019-07-25 ENCOUNTER — Other Ambulatory Visit (INDEPENDENT_AMBULATORY_CARE_PROVIDER_SITE_OTHER): Payer: Self-pay | Admitting: Internal Medicine

## 2019-07-25 DIAGNOSIS — E1122 Type 2 diabetes mellitus with diabetic chronic kidney disease: Secondary | ICD-10-CM | POA: Diagnosis not present

## 2019-07-25 DIAGNOSIS — G43909 Migraine, unspecified, not intractable, without status migrainosus: Secondary | ICD-10-CM | POA: Diagnosis not present

## 2019-07-25 DIAGNOSIS — D696 Thrombocytopenia, unspecified: Secondary | ICD-10-CM | POA: Diagnosis not present

## 2019-07-25 DIAGNOSIS — I10 Essential (primary) hypertension: Secondary | ICD-10-CM | POA: Diagnosis not present

## 2019-07-25 DIAGNOSIS — K5100 Ulcerative (chronic) pancolitis without complications: Secondary | ICD-10-CM | POA: Diagnosis not present

## 2019-07-25 DIAGNOSIS — Z23 Encounter for immunization: Secondary | ICD-10-CM | POA: Diagnosis not present

## 2019-08-01 DIAGNOSIS — H401211 Low-tension glaucoma, right eye, mild stage: Secondary | ICD-10-CM | POA: Diagnosis not present

## 2019-08-03 DIAGNOSIS — E1140 Type 2 diabetes mellitus with diabetic neuropathy, unspecified: Secondary | ICD-10-CM | POA: Diagnosis not present

## 2019-08-03 DIAGNOSIS — B351 Tinea unguium: Secondary | ICD-10-CM | POA: Diagnosis not present

## 2019-08-03 DIAGNOSIS — L110 Acquired keratosis follicularis: Secondary | ICD-10-CM | POA: Diagnosis not present

## 2019-08-08 ENCOUNTER — Other Ambulatory Visit: Payer: Self-pay

## 2019-08-08 NOTE — Patient Outreach (Signed)
[facility]) Care Management  08/08/2019  Manuel Atkins 1933-01-30 [id number]   Medication Adherence call to Manuel Atkins [facility] spoke with patient he is past due on Losartan 100 mg patient explain he is taking 1 tablet daily and has enough for onother month.Manuel Atkins is showing past due under [place].   [facility] Management Direct Dial [phone]  Fax [phone] [email or web address]

## 2019-08-10 DIAGNOSIS — E782 Mixed hyperlipidemia: Secondary | ICD-10-CM | POA: Diagnosis not present

## 2019-08-10 DIAGNOSIS — E1122 Type 2 diabetes mellitus with diabetic chronic kidney disease: Secondary | ICD-10-CM | POA: Diagnosis not present

## 2019-08-19 ENCOUNTER — Other Ambulatory Visit: Payer: Self-pay

## 2019-08-19 ENCOUNTER — Encounter (HOSPITAL_COMMUNITY)
Admission: RE | Admit: 2019-08-19 | Discharge: 2019-08-19 | Disposition: A | Discharge status: Home / Self Care | Payer: Medicare Other | Source: Ambulatory Visit | Attending: Internal Medicine | Admitting: Internal Medicine

## 2019-08-19 DIAGNOSIS — K5190 Ulcerative colitis, unspecified, without complications: Secondary | ICD-10-CM | POA: Insufficient documentation

## 2019-08-19 MED ORDER — SODIUM CHLORIDE 0.9 % IV SOLN
Freq: Once | INTRAVENOUS | Status: AC
  Administered 2019-08-19: 08:00:00 via INTRAVENOUS

## 2019-08-19 MED ORDER — SODIUM CHLORIDE 0.9 % IV SOLN
5.0000 mg/kg | Freq: Once | INTRAVENOUS | Status: AC
  Administered 2019-08-19: 500 mg via INTRAVENOUS
  Filled 2019-08-19: qty 50

## 2019-08-19 NOTE — Progress Notes (Signed)
Pt took his tylenol, claritin and benadryl at home before coming to hospital.

## 2019-09-09 DIAGNOSIS — E7800 Pure hypercholesterolemia, unspecified: Secondary | ICD-10-CM | POA: Diagnosis not present

## 2019-09-09 DIAGNOSIS — I10 Essential (primary) hypertension: Secondary | ICD-10-CM | POA: Diagnosis not present

## 2019-09-22 ENCOUNTER — Encounter (INDEPENDENT_AMBULATORY_CARE_PROVIDER_SITE_OTHER): Payer: Self-pay

## 2019-09-26 ENCOUNTER — Telehealth: Payer: Self-pay | Admitting: Adult Health

## 2019-09-26 ENCOUNTER — Other Ambulatory Visit: Payer: Self-pay

## 2019-09-26 ENCOUNTER — Observation Stay (HOSPITAL_COMMUNITY)
Admission: EM | Admit: 2019-09-26 | Discharge: 2019-09-27 | Disposition: A | Discharge status: Home / Self Care | Payer: Medicare Other | Attending: Family Medicine | Admitting: Family Medicine

## 2019-09-26 ENCOUNTER — Ambulatory Visit (HOSPITAL_COMMUNITY)
Admission: RE | Admit: 2019-09-26 | Discharge: 2019-09-26 | Disposition: A | Discharge status: Home / Self Care | Payer: Medicare Other | Source: Ambulatory Visit | Attending: Adult Health | Admitting: Adult Health

## 2019-09-26 ENCOUNTER — Encounter (HOSPITAL_COMMUNITY): Payer: Self-pay | Admitting: *Deleted

## 2019-09-26 ENCOUNTER — Encounter: Payer: Self-pay | Admitting: Adult Health

## 2019-09-26 ENCOUNTER — Ambulatory Visit (INDEPENDENT_AMBULATORY_CARE_PROVIDER_SITE_OTHER): Payer: Medicare Other | Admitting: Adult Health

## 2019-09-26 VITALS — BP 118/66 | HR 92 | Temp 98.0°F | Ht 71.0 in | Wt 207.0 lb

## 2019-09-26 DIAGNOSIS — J849 Interstitial pulmonary disease, unspecified: Secondary | ICD-10-CM | POA: Insufficient documentation

## 2019-09-26 DIAGNOSIS — Z20828 Contact with and (suspected) exposure to other viral communicable diseases: Secondary | ICD-10-CM | POA: Insufficient documentation

## 2019-09-26 DIAGNOSIS — Z86711 Personal history of pulmonary embolism: Secondary | ICD-10-CM | POA: Diagnosis present

## 2019-09-26 DIAGNOSIS — R0602 Shortness of breath: Secondary | ICD-10-CM | POA: Insufficient documentation

## 2019-09-26 DIAGNOSIS — R0609 Other forms of dyspnea: Secondary | ICD-10-CM

## 2019-09-26 DIAGNOSIS — E1169 Type 2 diabetes mellitus with other specified complication: Secondary | ICD-10-CM

## 2019-09-26 DIAGNOSIS — K219 Gastro-esophageal reflux disease without esophagitis: Secondary | ICD-10-CM | POA: Diagnosis not present

## 2019-09-26 DIAGNOSIS — I2699 Other pulmonary embolism without acute cor pulmonale: Secondary | ICD-10-CM | POA: Diagnosis not present

## 2019-09-26 DIAGNOSIS — Z87891 Personal history of nicotine dependence: Secondary | ICD-10-CM | POA: Diagnosis not present

## 2019-09-26 DIAGNOSIS — Z7982 Long term (current) use of aspirin: Secondary | ICD-10-CM | POA: Diagnosis not present

## 2019-09-26 DIAGNOSIS — G4733 Obstructive sleep apnea (adult) (pediatric): Secondary | ICD-10-CM

## 2019-09-26 DIAGNOSIS — R2681 Unsteadiness on feet: Secondary | ICD-10-CM | POA: Insufficient documentation

## 2019-09-26 DIAGNOSIS — E119 Type 2 diabetes mellitus without complications: Secondary | ICD-10-CM | POA: Insufficient documentation

## 2019-09-26 DIAGNOSIS — I2693 Single subsegmental pulmonary embolism without acute cor pulmonale: Secondary | ICD-10-CM | POA: Diagnosis not present

## 2019-09-26 DIAGNOSIS — M6281 Muscle weakness (generalized): Secondary | ICD-10-CM | POA: Insufficient documentation

## 2019-09-26 DIAGNOSIS — I10 Essential (primary) hypertension: Secondary | ICD-10-CM | POA: Diagnosis not present

## 2019-09-26 DIAGNOSIS — K2100 Gastro-esophageal reflux disease with esophagitis, without bleeding: Secondary | ICD-10-CM

## 2019-09-26 DIAGNOSIS — R200 Anesthesia of skin: Secondary | ICD-10-CM

## 2019-09-26 DIAGNOSIS — Z79899 Other long term (current) drug therapy: Secondary | ICD-10-CM | POA: Insufficient documentation

## 2019-09-26 DIAGNOSIS — Z888 Allergy status to other drugs, medicaments and biological substances status: Secondary | ICD-10-CM | POA: Diagnosis not present

## 2019-09-26 DIAGNOSIS — E785 Hyperlipidemia, unspecified: Secondary | ICD-10-CM

## 2019-09-26 DIAGNOSIS — E782 Mixed hyperlipidemia: Secondary | ICD-10-CM | POA: Insufficient documentation

## 2019-09-26 DIAGNOSIS — R918 Other nonspecific abnormal finding of lung field: Secondary | ICD-10-CM | POA: Diagnosis not present

## 2019-09-26 LAB — CBC WITH DIFFERENTIAL/PLATELET
Absolute Monocytes: 1665 cells/uL — ABNORMAL HIGH (ref 200–950)
Basophils Absolute: 57 cells/uL (ref 0–200)
Basophils Relative: 0.7 %
Eosinophils Absolute: 131 cells/uL (ref 15–500)
Eosinophils Relative: 1.6 %
HCT: 39.8 % (ref 38.5–50.0)
Hemoglobin: 13.6 g/dL (ref 13.2–17.1)
Lymphs Abs: 1246 cells/uL (ref 850–3900)
MCH: 32.2 pg (ref 27.0–33.0)
MCHC: 34.2 g/dL (ref 32.0–36.0)
MCV: 94.1 fL (ref 80.0–100.0)
MPV: 10.5 fL (ref 7.5–12.5)
Monocytes Relative: 20.3 %
Neutro Abs: 5100 cells/uL (ref 1500–7800)
Neutrophils Relative %: 62.2 %
Platelets: 168 10*3/uL (ref 140–400)
RBC: 4.23 10*6/uL (ref 4.20–5.80)
RDW: 12.6 % (ref 11.0–15.0)
Total Lymphocyte: 15.2 %
WBC: 8.2 10*3/uL (ref 3.8–10.8)

## 2019-09-26 LAB — BASIC METABOLIC PANEL
BUN/Creatinine Ratio: 14 (calc) (ref 6–22)
BUN: 23 mg/dL (ref 7–25)
CO2: 25 mmol/L (ref 20–32)
Calcium: 9.2 mg/dL (ref 8.6–10.3)
Chloride: 101 mmol/L (ref 98–110)
Creat: 1.65 mg/dL — ABNORMAL HIGH (ref 0.70–1.11)
Glucose, Bld: 116 mg/dL — ABNORMAL HIGH (ref 65–99)
Potassium: 4.4 mmol/L (ref 3.5–5.3)
Sodium: 135 mmol/L (ref 135–146)

## 2019-09-26 LAB — POCT I-STAT CREATININE: Creatinine, Ser: 1.7 mg/dL — ABNORMAL HIGH (ref 0.61–1.24)

## 2019-09-26 LAB — BRAIN NATRIURETIC PEPTIDE: Brain Natriuretic Peptide: 23 pg/mL (ref <100)

## 2019-09-26 MED ORDER — ALBUTEROL SULFATE (2.5 MG/3ML) 0.083% IN NEBU: 5.0000 mg | INHALATION_SOLUTION | Freq: Once | RESPIRATORY_TRACT | Status: DC

## 2019-09-26 MED ORDER — LATANOPROST 0.005 % OP SOLN
1.0000 [drp] | Freq: Every day | OPHTHALMIC | Status: DC
  Filled 2019-09-26: qty 2.5

## 2019-09-26 MED ORDER — ROSUVASTATIN CALCIUM 10 MG PO TABS
10.0000 mg | ORAL_TABLET | Freq: Every evening | ORAL | Status: DC
  Filled 2019-09-26 (×2): qty 1

## 2019-09-26 MED ORDER — ASPIRIN EC 81 MG PO TBEC
81.0000 mg | DELAYED_RELEASE_TABLET | Freq: Every day | ORAL | Status: DC
  Administered 2019-09-27: 11:00:00 81 mg via ORAL
  Filled 2019-09-26: qty 1

## 2019-09-26 MED ORDER — IOHEXOL 350 MG/ML SOLN
100.0000 mL | Freq: Once | INTRAVENOUS | Status: AC | PRN
  Administered 2019-09-26: 100 mL via INTRAVENOUS

## 2019-09-26 MED ORDER — PANTOPRAZOLE SODIUM 40 MG PO TBEC
40.0000 mg | DELAYED_RELEASE_TABLET | ORAL | Status: DC
  Administered 2019-09-27: 40 mg via ORAL
  Filled 2019-09-26: qty 1

## 2019-09-26 MED ORDER — TAMSULOSIN HCL 0.4 MG PO CAPS: 0.4000 mg | ORAL_CAPSULE | Freq: Every day | ORAL | Status: DC

## 2019-09-26 MED ORDER — NITROGLYCERIN 0.4 MG SL SUBL: 0.4000 mg | SUBLINGUAL_TABLET | SUBLINGUAL | Status: DC | PRN

## 2019-09-26 MED ORDER — ENOXAPARIN SODIUM 100 MG/ML ~~LOC~~ SOLN
1.0000 mg/kg | Freq: Two times a day (BID) | SUBCUTANEOUS | Status: DC
  Administered 2019-09-27: 03:00:00 95 mg via SUBCUTANEOUS
  Filled 2019-09-26: qty 1

## 2019-09-26 MED ORDER — ACETAMINOPHEN 500 MG PO TABS: 1000.0000 mg | ORAL_TABLET | Freq: Four times a day (QID) | ORAL | Status: DC | PRN

## 2019-09-26 MED ORDER — GLIPIZIDE ER 2.5 MG PO TB24
2.5000 mg | ORAL_TABLET | Freq: Every day | ORAL | Status: DC
  Administered 2019-09-27: 11:00:00 2.5 mg via ORAL
  Filled 2019-09-26 (×3): qty 1

## 2019-09-26 MED ORDER — LOSARTAN POTASSIUM 50 MG PO TABS
100.0000 mg | ORAL_TABLET | Freq: Every day | ORAL | Status: DC
  Administered 2019-09-27: 100 mg via ORAL
  Filled 2019-09-26: qty 2

## 2019-09-26 MED ORDER — IMIPRAMINE HCL 25 MG PO TABS
75.0000 mg | ORAL_TABLET | Freq: Every day | ORAL | Status: DC
  Filled 2019-09-26 (×2): qty 1

## 2019-09-26 MED ORDER — ALBUTEROL SULFATE HFA 108 (90 BASE) MCG/ACT IN AERS
1.0000 | INHALATION_SPRAY | Freq: Four times a day (QID) | RESPIRATORY_TRACT | Status: DC | PRN
  Filled 2019-09-26: qty 6.7

## 2019-09-26 MED ORDER — ALPRAZOLAM 0.25 MG PO TABS: 0.1250 mg | ORAL_TABLET | Freq: Three times a day (TID) | ORAL | Status: DC | PRN

## 2019-09-26 NOTE — Telephone Encounter (Signed)
Patient seen in office today with worsening dyspnea . No hypoxia or hypotension on exam  Hx of PE in past  CTa Chest ordered and completed at [facility]   Results noted in Epic :  There is no central obstructing pulmonary embolus or abnormal filling defect to the level of the segmental pulmonary arteries to suggest a clinically significant acute pulmonary embolus. 3. There is a focal low-attenuation filling defect within a subsegmental branch of the right lower lobe pulmonary artery. This is an area of the lung which is obscured by motion artifact. This is equivocal for acute pulmonary embolus.  Patient called with results. Patient will go to ER for eval and consideration of treatment/admission  Patient in aggrement . No distress. Wife to transport in [facility] to [facility] ER .   [facility] ER called, Triage RN Legrand Como advised of report and patient to arrive shortly.

## 2019-09-26 NOTE — Assessment & Plan Note (Addendum)
Progressive decline over the last 3 months-unclear if this is a component of progressive ILD.  He has no increased cough.  O2 saturations are adequate on room air.  He does have a history of PE in the past.  We will go ahead and order a CTA with PE protocol.  As long as kidney function will allow.  If kidney function is not adequate we will most likely complete a VQ scan.. Will need a high-resolution CT chest to further evaluate if unable to complete CT chest (due to kidney fxn )  Look at cbc and bnp as well .   Plan  Patient Instructions  May try ProAir 2 puffs every 4hrs as needed for shortness of breath /wheezing .  Stat Labs today  We are setting your for a CT Chest scan  Continue on BIPAP At bedtime.  BIPAP pressures changes.  Follow up in 3 weeks with Dr. Vaughan Browner or Tawn Fitzner NP with PFT and 6 min walk test .
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
## 2019-09-26 NOTE — Progress Notes (Signed)
[place] Progress Note Patient Name: Manuel Atkins DOB: 11/20/1932 MRN: [id number]   Date of Service  09/26/2019  HPI/Events of Note  I was called by Dr. Clovis Riley of radiology regarding CTA lungs suggestive of PE in a RLL sub-segmental branch. Pt was at home.  eICU Interventions  I called Pt and instructed him to proceed to the ED ASAP for further evaluation and Rx. Dr. Thompson Caul had already called him and he is on his way. He is clinically stable at the time of the conversation, and going to be driven to the ED at [facility] by family.        Kerry Kass Ogan 09/26/2019, 8:11 PM

## 2019-09-26 NOTE — Assessment & Plan Note (Signed)
Interstitial lung disease on CT scan favoring NSIP.  Patient has significant clinical decline over the last 3 months questionable etiology.  Will check CT chest to rule out PE and progression of ILD if kidney function allows. No desaturations on room air are walking today in the office O2 saturations maintained 99 to 100%

## 2019-09-26 NOTE — ED Notes (Signed)
Failed PIV attempt x2. Second RN to room

## 2019-09-26 NOTE — Progress Notes (Signed)
@Patient  ID: Manuel Atkins, male    DOB: Jan 05, 1933, 85 y.o.   MRN: [id number]  Chief Complaint  Patient presents with  . Acute Visit    ILD     Referring provider: Caryl Bis, MD  HPI: 85 year old male former smoker followed for bronchiectasis with interstitial lung disease (ILD risk factors include occupational, underlying autoimmune with ulcerative colitis, previous treatment with infliximab, mesalamine and previous chronic aspiration with esophageal stricture.  Occupational exposure was to cotton, wood, dust and possible asbestos. Medical history significant for ulcerative colitis ([place]) , and obstructive sleep apnea on BiPAP Hx of PE ~10-15 yrs ago- ? tx with coumadin   TEST/EVENTS :  CT scan 07/27/2008-mild basal atelectasis right upper lobe pulmonary embolism CT scan 11/16/17-mild bronchiectasis, basal reticulation right greater than left. Borderline right hilar lymphadenopathy. Nodular liver contour possible cirrhosis High-resolution CT 01/26/18-patchy ground glass attenuation, mild bronchiectasis, septal thickening with no basal gradient. No honeycombing. Indeterminate for UIP. Hepatic steatosis, aortic atherosclerosis, left main and left anterior coronary artery disease. High-resolution CT 05/04/2018-mild basal predominant lung fibrosis stable from prior exam. High-resolution CT12/07/2018-stable interstitial lung disease I have reviewed the images personally.  Barium swallow 05/31/13-mild impairment of esophageal motility, stricture at GE junction with obstruction of barium tablet.  Labs Connective tissue serologies 11/23/17-ANA, ACE, CCP, rheumatoid factor all negative Hypersenitivity panel-negative  PFTs 12/23/17 FVC 2.30 (54%), FEV1 1.97 [66%], F/F 86, TLC 64%, DLCO 42% No obstruction, moderate restriction with severe diffusion defect  05/18/2018 FVC 2.32 [55%), FEV1 1.91 [65%], F/F 82, DLCO 43% Moderate restriction, severe diffusion defect.   10/20/2018 FVC 2.30 [55%], FEV1 1.96 [6 6%], F/F 85, DLCO 46% Moderate restriction, severe diffusion defect.  6-minute walk 03/03/18-338 m 6-minute walk 05/20/18-288 m 6-minute walk 10/27/18- 363 m  Cardiac RHC 04/20/18 RA: 9 mmHg RV: 29/7 mmHg PA: 29/12 mmHg, mean PAP 19 mmHg PCWP: 9 mmHg LVEDP: 14 mmHg  CO: 4.8 L/min CI: 2.3 L/min/m2  Sleep Received copy of sleep study from [facility], 08/01/2011 BiPAP titration. Titrated to IPAP 17, EPAP 13. Severe PLM's noted. Consider treatment for restless leg syndrome.   09/26/2019 Acute OV : ILD  Patient presents for progressively worsening shortness of breath.  Patient has known bronchiectasis and ILD.  Most recent high-resolution CT chest December 2019 showed no significant progression of fibrotic changes on CT favoring a NSIP diagnosis. Patient complains over the last 3 months that he has had progressive shortness of breath, decreased activity and exercise capacity.  He does have minimal dry cough.  He denies any chest pain orthopnea or leg swelling.  Says appetite has been low. Patient has upcoming PFT and 6-minute walk test next month. During last visit O2 saturations remained adequate greater than 90% on room air walking.  Patient says he has been checking his oxygen at home and is noticing that he is having frequent desaturations below 88% with any activity. Labs in August showed a normal BNP and hemoglobin.  Right and left heart cath in 2019 showed nonobstructive disease and no pulmonary hypertension.  Cardiopulmonary stress test July 2019 showed no desaturations during exercise.  Mild impairment of exercise capacity as a result of combination of circulatory respiratory and deconditioning. Today in the office walk test shows no desats 98-100% on room air. Confirmed with forehead and finger probe. Today in office pt personal pulse ox would not work.   Patient has underlying obstructive sleep apnea is on nocturnal BiPAP.   Patient is very compliant with his BiPAP.  Download shows excellent compliance with daily average usage around 10.5 hours.  He has minimal leaks.  Patient is on IPAP 14 EPAP of 10.  Control is not as good as last visit.  AHI is 14.3.  Main events are central in nature at 10/hour.    patient says he does feel tired.  He denies any new medications.  Denies any sedating meds.  Worse for last 2-3 months can not do anything . Was able to get out in yard and garden, now cant barely get out of bed and has trouble walking, needs walker .  No calf pain , chest pain or hemoptysis .     Allergies  Allergen Reactions  . Mercaptopurine Other (See Comments)    High Fever, Chills, Fatigue    Immunization History  Administered Date(s) Administered  . Influenza, High Dose Seasonal PF 07/19/2018, 08/10/2019  . Influenza-Unspecified 10/09/2017  . PPD Test 11/01/2012  . Pneumococcal Polysaccharide-23 11/11/2007  . Tdap 11/11/2007    Past Medical History:  Diagnosis Date  . 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 Dyspnea   . Essential hypertension, benign   . GERD (gastroesophageal reflux disease)   . Headache(784.0)   . History of colon polyps   . History of transfusion of whole blood   . Interstitial lung disease ([place]) 12/01/2017  . Mixed hyperlipidemia   . Obstructive sleep apnea (adult) (pediatric)    uses bipap @ HS  . Other malaise and fatigue   . Thrombocytopenia ([place]) 11/10/2012  . Type II or unspecified type diabetes mellitus without mention of complication, uncontrolled   . 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HCC)     Tobacco History: Social History   Tobacco Use  Smoking Status Former Smoker  . Years: 20.00  . Types: Pipe, Cigars  . Quit date: 11/10/1968  . Years since quitting: 50.9  Smokeless Tobacco Never Used   Counseling given: Not Answered   Outpatient Medications Prior to Visit  Medication Sig Dispense Refill  . acetaminophen (TYLENOL) 500 MG tablet Take 1,000 mg by mouth  every 6 (six) hours as needed for moderate pain or headache.    . albuterol (PROAIR HFA) 108 (90 Base) MCG/ACT inhaler Inhale 1-2 puffs into the lungs every 6 (six) hours as needed for wheezing or shortness of breath. 8 g 2  . ALPRAZolam (XANAX) 0.25 MG tablet Take 0.5 tablets (0.125 mg total) by mouth 3 (three) times daily as needed for anxiety. for anxiety 135 tablet 1  . aspirin EC 81 MG tablet Take 81 mg by mouth daily.    Marland Kitchen FLOMAX 0.4 MG CAPS capsule Take 0.4 mg by mouth daily after supper.     Marland Kitchen glipiZIDE (GLUCOTROL XL) 2.5 MG 24 hr tablet Take 2.5 mg by mouth daily with breakfast.     . imipramine (TOFRANIL) 25 MG tablet Take 75 mg by mouth at bedtime  0  . inFLIXimab in sodium chloride 0.9 % Inject 5 mg/kg into the vein every 8 (eight) weeks.    Marland Kitchen latanoprost (XALATAN) 0.005 % ophthalmic solution Place 1 drop into both eyes at bedtime.  6  . losartan (COZAAR) 100 MG tablet Take 100 mg by mouth daily.     . metFORMIN (GLUCOPHAGE-XR) 500 MG 24 hr tablet Patient takes 500 mg in the morning and 1000 mg at night.  1  . nitroGLYCERIN (NITROSTAT) 0.4 MG SL tablet Place 0.4 mg under the tongue every 5 (five) minutes as needed for chest pain.   1  . pantoprazole (  PROTONIX) 40 MG tablet TAKE ONE TABLET BY MOUTH EVERY *OTHER* DAY. 45 tablet 3  . Probiotic Product (PROBIOTIC DAILY PO) Take 1 capsule by mouth daily.     . rosuvastatin (CRESTOR) 10 MG tablet TAKE ONE TABLET BY MOUTH DAILY AT NIGHT     No facility-administered medications prior to visit.      Review of Systems:   Constitutional:   No  weight loss, night sweats,  Fevers, chills,  +fatigue, or  lassitude.  HEENT:   No headaches,  Difficulty swallowing,  Tooth/dental problems, or  Sore throat,                No sneezing, itching, ear ache, nasal congestion, post nasal drip,   CV:  No chest pain,  Orthopnea, PND, swelling in lower extremities, anasarca, dizziness, palpitations, syncope.   GI  No heartburn, indigestion, abdominal  pain, nausea, vomiting, diarrhea, change in bowel habits, loss of appetite, bloody stools.   Resp:   No chest wall deformity  Skin: no rash or lesions.  GU: no dysuria, change in color of urine, no urgency or frequency.  No flank pain, no hematuria   MS:  No joint pain or swelling.  No decreased range of motion.  No back pain.    Physical Exam  BP 118/66 (BP Location: Left Arm, Cuff Size: Normal)   Pulse 92   Temp 98 F (36.7 C) (Temporal)   Ht 5' 11"  (1.803 m)   Wt 207 lb (93.9 kg)   SpO2 100%   BMI 28.87 kg/m   GEN: A/Ox3; pleasant , NAD, elderly    HEENT:  [place]/AT,   NOSE-clear, THROAT-clear, no lesions, no postnasal drip or exudate noted.   NECK:  Supple w/ fair ROM; no JVD; normal carotid impulses w/o bruits; no thyromegaly or nodules palpated; no lymphadenopathy.    RESP  BB crackles ,   no accessory muscle use, no dullness to percussion  CARD:  RRR, no m/r/g, no peripheral edema, pulses intact, no cyanosis or clubbing.  GI:   Soft & nt; nml bowel sounds; no organomegaly or masses detected.   Musco: Warm bil, no deformities or joint swelling noted.   Neuro: alert, no focal deficits noted.    Skin: Warm, no lesions or rashes    Lab Results:  CBC    Component Value Date/Time   WBC 7.8 06/23/2019 1431   RBC 4.38 06/23/2019 1431   HGB 14.3 06/23/2019 1431   HCT 41.9 06/23/2019 1431   PLT 180.0 06/23/2019 1431   MCV 95.6 06/23/2019 1431   MCH 31.2 03/28/2013 1116   MCHC 34.2 06/23/2019 1431   RDW 13.1 06/23/2019 1431   LYMPHSABS 1.8 06/23/2019 1431   MONOABS 0.8 06/23/2019 1431   EOSABS 0.1 06/23/2019 1431   BASOSABS 0.1 06/23/2019 1431    BMET    Component Value Date/Time   NA 138 06/23/2019 1431   K 4.3 06/23/2019 1431   CL 103 06/23/2019 1431   CO2 25 06/23/2019 1431   GLUCOSE 163 (H) 06/23/2019 1431   BUN 34 (H) 06/23/2019 1431   CREATININE 1.54 (H) 06/23/2019 1431   CREATININE 0.96 10/19/2012 1544   CALCIUM 9.6 06/23/2019 1431   CALCIUM  9.6 11/15/2012 1425   GFRNONAA 85 (L) 11/09/2012 0518   GFRAA >90 11/09/2012 0518    BNP No results found for: BNP  ProBNP    Component Value Date/Time   PROBNP 30.0 06/23/2019 1431    Imaging: No results found.  PFT Results Latest Ref Rng & Units 10/20/2018 05/20/2018 12/23/2017  FVC-Pre L 2.30 2.32 2.44  FVC-Predicted Pre % 55 55 58  FVC-Post L - - 2.30  FVC-Predicted Post % - - 54  Pre FEV1/FVC % % 85 82 82  Post FEV1/FCV % % - - 86  FEV1-Pre L 1.96 1.91 2.01  FEV1-Predicted Pre % 66 65 67  FEV1-Post L - - 1.97  DLCO UNC% % 46 43 43  DLCO COR %Predicted % 91 79 75  TLC L - - 4.77  TLC % Predicted % - - 64  RV % Predicted % - - 75    No results found for: NITRICOXIDE      Assessment & Plan:   Dyspnea Progressive decline over the last 3 months-unclear if this is a component of progressive ILD.  He has no increased cough.  O2 saturations are adequate on room air.  He does have a history of PE in the past.  We will go ahead and order a CTA with PE protocol.  As long as kidney function will allow.  If kidney function is not adequate we will most likely complete a VQ scan.. Will need a high-resolution CT chest to further evaluate if unable to complete CT chest (due to kidney fxn )  Look at cbc and bnp as well .   Plan  Patient Instructions  May try ProAir 2 puffs every 4hrs as needed for shortness of breath /wheezing .  Stat Labs today  We are setting your for a CT Chest scan  Continue on BIPAP At bedtime.  BIPAP pressures changes.  Follow up in 3 weeks with Dr. Vaughan Browner or Adjoa Althouse NP with PFT and 6 min walk test .        ILD (interstitial lung disease) ([place]) Interstitial lung disease on CT scan favoring NSIP.  Patient has significant clinical decline over the last 3 months questionable etiology.  Will check CT chest to rule out PE and progression of ILD if kidney function allows. No desaturations on room air are walking today in the office O2 saturations  maintained 99 to 100%   OSA (obstructive sleep apnea) OSA  -excellent compliance on BiPAP however control was not as good unclear why his number of events are increased.  He has no new medications.  We will check a BNP no signs of obvious volume overload on exam.  We will adjust pressures of BiPAP increase IPAP to 16 and EPAP to 12 download on return  Plan  Patient Instructions  May try ProAir 2 puffs every 4hrs as needed for shortness of breath /wheezing .  Stat Labs today  We are setting your for a CT Chest scan  Continue on BIPAP At bedtime.  BIPAP pressures changes.  Follow up in 3 weeks with Dr. Vaughan Browner or Krystina Strieter NP with PFT and 6 min walk test .           Rexene Edison, NP 09/26/2019

## 2019-09-26 NOTE — Assessment & Plan Note (Signed)
OSA  -excellent compliance on BiPAP however control was not as good unclear why his number of events are increased.  He has no new medications.  We will check a BNP no signs of obvious volume overload on exam.  We will adjust pressures of BiPAP increase IPAP to 16 and EPAP to 12 download on return  Plan  Patient Instructions  May try ProAir 2 puffs every 4hrs as needed for shortness of breath /wheezing .  Stat Labs today  We are setting your for a CT Chest scan  Continue on BIPAP At bedtime.  BIPAP pressures changes.  Follow up in 3 weeks with Dr. Vaughan Browner or Nigeria Lasseter NP with PFT and 6 min walk test .

## 2019-09-26 NOTE — ED Triage Notes (Signed)
Pt c/o increased sob x 3 weeks; pt was seen at his PCP today and had a CT done that showed he has a PE in the right lung; pt denies any pain

## 2019-09-26 NOTE — Patient Instructions (Addendum)
May try ProAir 2 puffs every 4hrs as needed for shortness of breath /wheezing .  Stat Labs today  We are setting your for a CT Chest scan  Continue on BIPAP At bedtime.  BIPAP pressures changes.  Follow up in 3 weeks with Dr. Vaughan Browner or [place] NP with PFT and 6 min walk test .

## 2019-09-27 DIAGNOSIS — J849 Interstitial pulmonary disease, unspecified: Secondary | ICD-10-CM | POA: Diagnosis not present

## 2019-09-27 DIAGNOSIS — I2693 Single subsegmental pulmonary embolism without acute cor pulmonale: Secondary | ICD-10-CM | POA: Diagnosis not present

## 2019-09-27 DIAGNOSIS — I10 Essential (primary) hypertension: Secondary | ICD-10-CM | POA: Diagnosis not present

## 2019-09-27 DIAGNOSIS — K2100 Gastro-esophageal reflux disease with esophagitis, without bleeding: Secondary | ICD-10-CM | POA: Diagnosis not present

## 2019-09-27 DIAGNOSIS — G4733 Obstructive sleep apnea (adult) (pediatric): Secondary | ICD-10-CM | POA: Diagnosis not present

## 2019-09-27 LAB — BASIC METABOLIC PANEL
Anion gap: 10 (ref 5–15)
BUN: 25 mg/dL — ABNORMAL HIGH (ref 8–23)
CO2: 24 mmol/L (ref 22–32)
Calcium: 8.6 mg/dL — ABNORMAL LOW (ref 8.9–10.3)
Chloride: 103 mmol/L (ref 98–111)
Creatinine, Ser: 1.66 mg/dL — ABNORMAL HIGH (ref 0.61–1.24)
GFR calc Af Amer: 43 mL/min — ABNORMAL LOW (ref >60)
GFR calc non Af Amer: 37 mL/min — ABNORMAL LOW (ref >60)
Glucose, Bld: 197 mg/dL — ABNORMAL HIGH (ref 70–99)
Potassium: 4.2 mmol/L (ref 3.5–5.1)
Sodium: 137 mmol/L (ref 135–145)

## 2019-09-27 LAB — GLUCOSE, CAPILLARY
Glucose-Capillary: 123 mg/dL — ABNORMAL HIGH (ref 70–99)
Glucose-Capillary: 146 mg/dL — ABNORMAL HIGH (ref 70–99)
Glucose-Capillary: 111 mg/dL — ABNORMAL HIGH (ref 70–99)

## 2019-09-27 LAB — CBC
HCT: 38.7 % — ABNORMAL LOW (ref 39.0–52.0)
Hemoglobin: 12.7 g/dL — ABNORMAL LOW (ref 13.0–17.0)
MCH: 31.8 pg (ref 26.0–34.0)
MCHC: 32.8 g/dL (ref 30.0–36.0)
MCV: 96.8 fL (ref 80.0–100.0)
Platelets: 167 10*3/uL (ref 150–400)
RBC: 4 MIL/uL — ABNORMAL LOW (ref 4.22–5.81)
RDW: 13.2 % (ref 11.5–15.5)
WBC: 7.5 10*3/uL (ref 4.0–10.5)
nRBC: 0 % (ref 0.0–0.2)

## 2019-09-27 LAB — SARS CORONAVIRUS 2 (TAT 6-24 HRS): SARS Coronavirus 2: NEGATIVE

## 2019-09-27 MED ORDER — APIXABAN 5 MG PO TABS
10.0000 mg | ORAL_TABLET | Freq: Two times a day (BID) | ORAL | Status: DC
  Administered 2019-09-27: 17:00:00 10 mg via ORAL
  Filled 2019-09-27: qty 2

## 2019-09-27 MED ORDER — APIXABAN 5 MG PO TABS: 5.0000 mg | ORAL_TABLET | Freq: Two times a day (BID) | ORAL | Status: DC

## 2019-09-27 MED ORDER — ELIQUIS DVT/PE STARTER PACK 5 MG PO TBPK: ORAL_TABLET | ORAL | 0 refills | Status: DC

## 2019-09-27 MED ORDER — ALBUTEROL SULFATE (2.5 MG/3ML) 0.083% IN NEBU: 2.5000 mg | INHALATION_SOLUTION | RESPIRATORY_TRACT | Status: DC | PRN

## 2019-09-27 NOTE — Progress Notes (Addendum)
ANTICOAGULATION CONSULT NOTE - Initial Consult  Pharmacy Consult for apixaban Indication: pulmonary embolus  Allergies  Allergen Reactions  . Mercaptopurine Other (See Comments)    High Fever, Chills, Fatigue    Patient Measurements: Height: 5' 11.5" (181.6 cm) Weight: 205 lb 7.5 oz (93.2 kg) IBW/kg (Calculated) : 76.45   Vital Signs: Temp: 98.1 F (36.7 C) (11/17 0700) Temp Source: Oral (11/17 0700) BP: 149/66 (11/17 0809) Pulse Rate: 74 (11/17 0809)  Labs: Recent Labs    09/26/19 1646 09/26/19 1824 09/27/19 0504  HGB 13.6  --  12.7*  HCT 39.8  --  38.7*  PLT 168  --  167  CREATININE 1.65* 1.70* 1.66*    Estimated Creatinine Clearance: 39 mL/min (A) (by C-G formula based on SCr of 1.66 mg/dL (H)).   Medical History: Past Medical History:  Diagnosis Date  . 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 Dyspnea   . Essential hypertension, benign   . GERD (gastroesophageal reflux disease)   . Headache(784.0)   . History of colon polyps   . History of transfusion of whole blood   . Interstitial lung disease ([place]) 12/01/2017  . Mixed hyperlipidemia   . Obstructive sleep apnea (adult) (pediatric)    uses bipap @ HS  . Other malaise and fatigue   . Thrombocytopenia ([place]) 11/10/2012  . Type II or unspecified type diabetes mellitus without mention of complication, uncontrolled   . 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HCC)     Medications:  Medications Prior to Admission  Medication Sig Dispense Refill Last Dose  . acetaminophen (TYLENOL) 500 MG tablet Take 1,000 mg by mouth every 6 (six) hours as needed for moderate pain or headache.   Past Week at Unknown time  . albuterol (PROAIR HFA) 108 (90 Base) MCG/ACT inhaler Inhale 1-2 puffs into the lungs every 6 (six) hours as needed for wheezing or shortness of breath. 8 g 2 unknown  . ALPRAZolam (XANAX) 0.25 MG tablet Take 0.5 tablets (0.125 mg total) by mouth 3 (three) times daily as needed for anxiety. for anxiety 135 tablet 1  unknown  . aspirin EC 81 MG tablet Take 81 mg by mouth daily.   09/26/2019 at Unknown time  . FLOMAX 0.4 MG CAPS capsule Take 0.4 mg by mouth daily after supper.    09/26/2019 at Unknown time  . glipiZIDE (GLUCOTROL XL) 2.5 MG 24 hr tablet Take 2.5 mg by mouth daily with breakfast.    09/26/2019 at Unknown time  . imipramine (TOFRANIL) 25 MG tablet Take 75 mg by mouth at bedtime  0 09/25/2019 at Unknown time  . inFLIXimab in sodium chloride 0.9 % Inject 5 mg/kg into the vein every 8 (eight) weeks.   08/2019  . latanoprost (XALATAN) 0.005 % ophthalmic solution Place 1 drop into both eyes at bedtime.  6 09/25/2019 at Unknown time  . losartan (COZAAR) 100 MG tablet Take 100 mg by mouth daily.    09/26/2019 at Unknown time  . metFORMIN (GLUCOPHAGE-XR) 500 MG 24 hr tablet Take 500-1,000 mg by mouth See admin instructions. Patient takes 500 mg in the morning and 1000 mg at night.  1 09/26/2019 at morning  . nitroGLYCERIN (NITROSTAT) 0.4 MG SL tablet Place 0.4 mg under the tongue every 5 (five) minutes as needed for chest pain.   1 unknown  . pantoprazole (PROTONIX) 40 MG tablet TAKE ONE TABLET BY MOUTH EVERY *OTHER* DAY. (Patient taking differently: Take 40 mg by mouth every other day. ) 45 tablet 3 09/26/2019 at Unknown  time  . Probiotic Product (PROBIOTIC DAILY PO) Take 1 capsule by mouth daily.    09/26/2019 at Unknown time  . rosuvastatin (CRESTOR) 10 MG tablet Take 10 mg by mouth every evening.    09/26/2019 at Unknown time    Assessment: Pharmacy consulted to dose apixaban with acute PE.  Patient is transitioning from therapeutic lovenox dose to apixaban.   Goal of Therapy:   Monitor platelets by anticoagulation protocol: Yes   Plan:  Start apixaban when next Lovenox dose is due. Apixaban 10 mg BID x 7 days followed by apixaban 5 mg BID. Monitor labs and s/s of bleeding.  Margot Ables, PharmD Clinical Pharmacist 09/27/2019 11:22 AM

## 2019-09-27 NOTE — ED Notes (Signed)
Pt ambulatory to restroom

## 2019-09-27 NOTE — Discharge Instructions (Signed)
Information on my medicine - ELIQUIS (apixaban)  This medication education was reviewed with me or my healthcare representative as part of my discharge preparation.  The pharmacist that spoke with me during my hospital stay was:  Ramond Craver, [facility]  Why was Eliquis prescribed for you? Eliquis was prescribed to treat blood clots that may have been found in the veins of your legs (deep vein thrombosis) or in your lungs (pulmonary embolism) and to reduce the risk of them occurring again.  What do You need to know about Eliquis ? The starting dose is 10 mg (two 5 mg tablets) taken TWICE daily for the FIRST SEVEN (7) DAYS, then on (enter date)  10/04/19  the dose is reduced to ONE 5 mg tablet taken TWICE daily.  Eliquis may be taken with or without food.   Try to take the dose about the same time in the morning and in the evening. If you have difficulty swallowing the tablet whole please discuss with your pharmacist how to take the medication safely.  Take Eliquis exactly as prescribed and DO NOT stop taking Eliquis without talking to the doctor who prescribed the medication.  Stopping may increase your risk of developing a new blood clot.  Refill your prescription before you run out.  After discharge, you should have regular check-up appointments with your healthcare provider that is prescribing your Eliquis.    What do you do if you miss a dose? If a dose of ELIQUIS is not taken at the scheduled time, take it as soon as possible on the same day and twice-daily administration should be resumed. The dose should not be doubled to make up for a missed dose.  Important Safety Information A possible side effect of Eliquis is bleeding. You should call your healthcare provider right away if you experience any of the following: ? Bleeding from an injury or your nose that does not stop. ? Unusual colored urine (red or dark brown) or unusual colored stools (red or black). ? Unusual bruising for  unknown reasons. ? A serious fall or if you hit your head (even if there is no bleeding).  Some medicines may interact with Eliquis and might increase your risk of bleeding or clotting while on Eliquis. To help avoid this, consult your healthcare provider or pharmacist prior to using any new prescription or non-prescription medications, including herbals, vitamins, non-steroidal anti-inflammatory drugs (NSAIDs) and supplements.  This website has more information on Eliquis (apixaban): http://[email or web address].com/eliquis/home

## 2019-09-27 NOTE — Discharge Summary (Signed)
Physician Discharge Summary  Manuel Atkins:[id number] DOB: [birth year] DOA: 09/26/2019  PCP: Caryl Bis, MD Pulmonology: [facility]  Admit date: 09/26/2019 Discharge date: 09/27/2019  Admitted From:  Home  Disposition: Home   Recommendations for Outpatient Follow-up:  1. Follow up with PCP in 1-2  Weeks 2. Follow up with pulmonology in 1-2 weeks  Discharge Condition: STABLE   CODE STATUS: FULL    Brief Hospitalization Summary: Please see all hospital notes, images, labs for full details of the hospitalization. HPI: Dr. Jamse Arn:   Manuel Atkins is an 85 y.o. male with past medical history significant for pulmonary embolus 20 years ago, hypertension, DM 2, HL, ulcerative colitis, GERD with esophageal stricture and interstitial lung disease/bronchiectasis who was sent over from the [facility] pulmonary for treatment of possible pulmonary embolus.  Patient states he was in his usual state of health until 1 month ago when he started having progressive dyspnea on exertion.  Patient notes that he likes to go outside and walk in his garden and do a little bit of yard work however over the past 3 to 4 months that is been difficult to do.  Over the past month he has had difficulty even walking around his house inside.  Over the past week he says he short of breath "before even get out of the bedroom".  [facility] called [facility] to evaluate this.  A CT angiogram was done which showed a possible equivocal subsegmental PE and patient was referred to [place] for admission and treatment.  Patient himself denies any fevers or chills.  No malaise.  No new cough.  No hemoptysis.  Patient denies abdominal pain nausea vomiting or diarrhea.  States his ulcerative colitis is under great control on Remicade.  Patient denies any chest pain or palpitations.  No pleuritic chest pain.  ED Course:  The patient was noted to have O2 saturations greater than 95% on room air at rest.  He is now  admitted for anticoagulation for possible PE.  The patient was admitted for acute segmental pulmonary embolus.  He was anticoagulated initially with lovenox and then transitioned to oral apixaban.  He was evaluated by PT and outpatient PT has been recommended and ordered for him.  He is feeling a lot better today and he wants to go home.  He was seen by the Pharm D and he was counseled on apixaban and bleeding precautions were given while on anticoagulant therapy.  He has close follow up arranged already with [facility] pulmonology and with his PCP where he can discuss further management recommendations.    Discharge Diagnoses:  Principal Problem:   Pulmonary embolus (HCC) Active Problems:   Essential hypertension, benign   Type 2 diabetes mellitus with hyperlipidemia (HCC)   ILD (interstitial lung disease) (HCC)   OSA (obstructive sleep apnea)   GERD (gastroesophageal reflux disease)   Discharge Instructions:  Allergies as of 09/27/2019      Reactions   Mercaptopurine Other (See Comments)   High Fever, Chills, Fatigue      Medication List    TAKE these medications   acetaminophen 500 MG tablet Commonly known as: TYLENOL Take 1,000 mg by mouth every 6 (six) hours as needed for moderate pain or headache.   albuterol 108 (90 Base) MCG/ACT inhaler Commonly known as: ProAir HFA Inhale 1-2 puffs into the lungs every 6 (six) hours as needed for wheezing or shortness of breath.   ALPRAZolam 0.25 MG tablet Commonly known as: XANAX Take  0.5 tablets (0.125 mg total) by mouth 3 (three) times daily as needed for anxiety. for anxiety   aspirin EC 81 MG tablet Take 81 mg by mouth daily.   Eliquis DVT/PE Starter Pack 5 MG Tbpk Generic drug: Apixaban Starter Pack Take as directed on package: start with two-243m tablets twice daily for 7 days. On day 8, switch to one-566mtablet twice daily.   Flomax 0.4 MG Caps capsule Generic drug: tamsulosin Take 0.4 mg by mouth daily after supper.    glipiZIDE 2.5 MG 24 hr tablet Commonly known as: GLUCOTROL XL Take 2.5 mg by mouth daily with breakfast.   imipramine 25 MG tablet Commonly known as: TOFRANIL Take 75 mg by mouth at bedtime   inFLIXimab in sodium chloride 0.9 % Inject 5 mg/kg into the vein every 8 (eight) weeks.   latanoprost 0.005 % ophthalmic solution Commonly known as: XALATAN Place 1 drop into both eyes at bedtime.   losartan 100 MG tablet Commonly known as: COZAAR Take 100 mg by mouth daily.   metFORMIN 500 MG 24 hr tablet Commonly known as: GLUCOPHAGE-XR Take 500-1,000 mg by mouth See admin instructions. Patient takes 500 mg in the morning and 1000 mg at night.   nitroGLYCERIN 0.4 MG SL tablet Commonly known as: NITROSTAT Place 0.4 mg under the tongue every 5 (five) minutes as needed for chest pain.   pantoprazole 40 MG tablet Commonly known as: PROTONIX TAKE ONE TABLET BY MOUTH EVERY *OTHER* DAY. What changed: See the new instructions.   PROBIOTIC DAILY PO Take 1 capsule by mouth daily.   rosuvastatin 10 MG tablet Commonly known as: CRESTOR Take 10 mg by mouth every evening.      Follow-up Information    Parrett, TaFonnie MuNP. Schedule an appointment as soon as possible for a visit in 1 week(s).   Specialty: Pulmonary Disease Why: Hospital Follow Up  Contact information: [address] [phone]        DaCaryl BisMD. Schedule an appointment as soon as possible for a visit in 2 week(s).   Specialty: Family Medicine Contact information: [address] [place] [phone]        Allergies  Allergen Reactions  . Mercaptopurine Other (See Comments)    High Fever, Chills, Fatigue   Allergies as of 09/27/2019      Reactions   Mercaptopurine Other (See Comments)   High Fever, Chills, Fatigue      Medication List    TAKE these medications   acetaminophen 500 MG tablet Commonly known as: TYLENOL Take 1,000 mg by mouth  every 6 (six) hours as needed for moderate pain or headache.   albuterol 108 (90 Base) MCG/ACT inhaler Commonly known as: ProAir HFA Inhale 1-2 puffs into the lungs every 6 (six) hours as needed for wheezing or shortness of breath.   ALPRAZolam 0.25 MG tablet Commonly known as: XANAX Take 0.5 tablets (0.125 mg total) by mouth 3 (three) times daily as needed for anxiety. for anxiety   aspirin EC 81 MG tablet Take 81 mg by mouth daily.   Eliquis DVT/PE Starter Pack 5 MG Tbpk Generic drug: Apixaban Starter Pack Take as directed on package: start with two-43m72mablets twice daily for 7 days. On day 8, switch to one-43mg108mblet twice daily.   Flomax 0.4 MG Caps capsule Generic drug: tamsulosin Take 0.4 mg by mouth daily after supper.   glipiZIDE 2.5 MG 24 hr tablet  Commonly known as: GLUCOTROL XL Take 2.5 mg by mouth daily with breakfast.   imipramine 25 MG tablet Commonly known as: TOFRANIL Take 75 mg by mouth at bedtime   inFLIXimab in sodium chloride 0.9 % Inject 5 mg/kg into the vein every 8 (eight) weeks.   latanoprost 0.005 % ophthalmic solution Commonly known as: XALATAN Place 1 drop into both eyes at bedtime.   losartan 100 MG tablet Commonly known as: COZAAR Take 100 mg by mouth daily.   metFORMIN 500 MG 24 hr tablet Commonly known as: GLUCOPHAGE-XR Take 500-1,000 mg by mouth See admin instructions. Patient takes 500 mg in the morning and 1000 mg at night.   nitroGLYCERIN 0.4 MG SL tablet Commonly known as: NITROSTAT Place 0.4 mg under the tongue every 5 (five) minutes as needed for chest pain.   pantoprazole 40 MG tablet Commonly known as: PROTONIX TAKE ONE TABLET BY MOUTH EVERY *OTHER* DAY. What changed: See the new instructions.   PROBIOTIC DAILY PO Take 1 capsule by mouth daily.   rosuvastatin 10 MG tablet Commonly known as: CRESTOR Take 10 mg by mouth every evening.       Procedures/Studies: Ct Angio Chest W/cm &/or Wo Cm  Addendum Date:  09/26/2019   ADDENDUM REPORT: 09/26/2019 19:53 ADDENDUM: Critical Value/emergent results were called by telephone at the time of interpretation on 09/26/2019 at 7:52 pm to providerDr. Ina Kick, who verbally acknowledged these results. Electronically Signed   By: Kerby Moors M.D.   On: 09/26/2019 19:53   Result Date: 09/26/2019 CLINICAL DATA:  Evaluate for pulmonary embolus. Dyspnea for 2 weeks. History of interstitial lung disease. EXAM: CT ANGIOGRAPHY CHEST WITH CONTRAST TECHNIQUE: Multidetector CT imaging of the chest was performed using the standard protocol during bolus administration of intravenous contrast. Multiplanar CT image reconstructions and MIPs were obtained to evaluate the vascular anatomy. CONTRAST:  146m OMNIPAQUE IOHEXOL 350 MG/ML SOLN COMPARISON:  CT chest 10/18/2018 FINDINGS: Cardiovascular: The heart size appears within normal limits. Aortic atherosclerosis. Lad coronary artery calcification. No pericardial effusion identified. Exam detail is diminished secondary to respiratory motion artifact. The main pulmonary artery appears patent. No saddle embolus or central obstructing pulmonary embolus identified. No filling defects identified to the level of the segmental pulmonary arteries. There is a single small filling defect within a subsegmental branch of the right lower lobe pulmonary artery which is partially obscured by respiratory motion artifact,a image 87/10 and image 164/5. No additional focal pulmonary artery filling defects identified. Mediastinum/Nodes: No enlarged mediastinal, hilar, or axillary lymph nodes. Thyroid gland, trachea, and esophagus demonstrate no significant findings. Lungs/Pleura: No pleural effusion, airspace consolidation, or pneumothorax. Again seen are mild changes of chronic interstitial lung disease including lower lobe predominant interstitial reticulation and mild cylindrical bronchiectasis and peripheral bronchiolectasis. Upper Abdomen: No acute abnormality.  Musculoskeletal: No chest wall abnormality. No acute or significant osseous findings. Review of the MIP images confirms the above findings. IMPRESSION: 1. Exam detail is diminished secondary to respiratory motion artifact. 2. There is no central obstructing pulmonary embolus or abnormal filling defect to the level of the segmental pulmonary arteries to suggest a clinically significant acute pulmonary embolus. 3. There is a focal low-attenuation filling defect within a subsegmental branch of the right lower lobe pulmonary artery. This is an area of the lung which is obscured by motion artifact. This is equivocal for acute pulmonary embolus. 4. Mild changes of chronic interstitial lung disease. 5.  Aortic Atherosclerosis (ICD10-I70.0). 6. Coronary artery calcifications. Electronically Signed: By: TKerby Moors  M.D. On: 09/26/2019 19:06      Subjective: Pt says he is feeling a lot better today and he wants to go home.  He has ambulated with PT.   He has had no bleeding episodes.    Discharge Exam: Vitals:   09/27/19 0700 09/27/19 0809  BP:  (!) 149/66  Pulse:  74  Resp:    Temp: 98.1 F (36.7 C)   SpO2:  100%   Vitals:   09/27/19 0607 09/27/19 0630 09/27/19 0700 09/27/19 0809  BP: (!) 142/68 136/77  (!) 149/66  Pulse: 72 71  74  Resp: (!) 24 (!) 24    Temp:   98.1 F (36.7 C)   TempSrc:   Oral   SpO2: 93% 98%  100%  Weight:   93.2 kg   Height:   5' 11.5" (1.816 m)    General: Pt is alert, awake, not in acute distress Cardiovascular: RRR, S1/S2 +, no rubs, no gallops Respiratory: coarse breath sounds bilateral Abdominal: Soft, NT, ND, bowel sounds + Extremities: no edema, no cyanosis   The results of significant diagnostics from this hospitalization (including imaging, microbiology, ancillary and laboratory) are listed below for reference.     Microbiology: No results found for this or any previous visit (from the past 240 hour(s)).   Labs: BNP (last 3 results) Recent Labs     09/26/19 1646  BNP 23   Basic Metabolic Panel: Recent Labs  Lab 09/26/19 1646 09/26/19 1824 09/27/19 0504  NA 135  --  137  K 4.4  --  4.2  CL 101  --  103  CO2 25  --  24  GLUCOSE 116*  --  197*  BUN 23  --  25*  CREATININE 1.65* 1.70* 1.66*  CALCIUM 9.2  --  8.6*   Liver Function Tests: No results for input(s): AST, ALT, ALKPHOS, BILITOT, PROT, ALBUMIN in the last 168 hours. No results for input(s): LIPASE, AMYLASE in the last 168 hours. No results for input(s): AMMONIA in the last 168 hours. CBC: Recent Labs  Lab 09/26/19 1646 09/27/19 0504  WBC 8.2 7.5  NEUTROABS 5,100  --   HGB 13.6 12.7*  HCT 39.8 38.7*  MCV 94.1 96.8  PLT 168 167   Cardiac Enzymes: No results for input(s): CKTOTAL, CKMB, CKMBINDEX, TROPONINI in the last 168 hours. BNP: Invalid input(s): POCBNP CBG: Recent Labs  Lab 09/27/19 0748 09/27/19 1146  GLUCAP 123* 146*   D-Dimer No results for input(s): DDIMER in the last 72 hours. Hgb A1c No results for input(s): HGBA1C in the last 72 hours. Lipid Profile No results for input(s): CHOL, HDL, LDLCALC, TRIG, CHOLHDL, LDLDIRECT in the last 72 hours. Thyroid function studies No results for input(s): TSH, T4TOTAL, T3FREE, THYROIDAB in the last 72 hours.  Invalid input(s): FREET3 Anemia work up No results for input(s): VITAMINB12, FOLATE, FERRITIN, TIBC, IRON, RETICCTPCT in the last 72 hours. Urinalysis    Component Value Date/Time   COLORURINE YELLOW 11/08/2012 0029   APPEARANCEUR CLEAR 11/08/2012 0029   LABSPEC 1.025 11/08/2012 0029   PHURINE 6.0 11/08/2012 0029   GLUCOSEU 250 (A) 11/08/2012 0029   HGBUR TRACE (A) 11/08/2012 0029   BILIRUBINUR NEGATIVE 11/08/2012 0029   KETONESUR 15 (A) 11/08/2012 0029   PROTEINUR NEGATIVE 11/08/2012 0029   UROBILINOGEN 0.2 11/08/2012 0029   NITRITE NEGATIVE 11/08/2012 0029   LEUKOCYTESUR NEGATIVE 11/08/2012 0029   Sepsis Labs Invalid input(s): PROCALCITONIN,  WBC,  LACTICIDVEN Microbiology No  results found for this  or any previous visit (from the past 240 hour(s)).  Time coordinating discharge:   SIGNED:  Irwin Brakeman, MD  [facility] 09/27/2019, 11:55 AM How to contact the [facility] Attending or Consulting provider [place] or covering provider during after hours [place], for this patient?  1. Check the care team in [facility] and look for a) attending/consulting [facility] provider listed and b) the [facility] team listed 2. Log into [email or web address] and use [place]'s universal password to access. If you do not have the password, please contact the hospital operator. 3. Locate the [facility] provider you are looking for under [facility] and page to a number that you can be directly reached. 4. If you still have difficulty reaching the provider, please page the [facility] (Director on Call) for the Hospitalists listed on amion for assistance.

## 2019-09-27 NOTE — Evaluation (Signed)
Physical Therapy Evaluation Patient Details Name: Manuel Atkins MRN: [id number] DOB: April 27, 1933 Today's Date: 09/27/2019   History of Present Illness  Manuel Atkins is an 85 y.o. male with past medical history significant for pulmonary embolus 20 years ago, hypertension, DM 2, HL, ulcerative colitis, GERD with esophageal stricture and interstitial lung disease/bronchiectasis who was sent over from the [facility] pulmonary for treatment of possible pulmonary embolus.    Clinical Impression  Patient functioning near baseline for functional mobility and gait, other than limited for ambulation distance due to c/o fatigue, on room air with SpO2 at 97%.  Patient tolerated sitting up in chair after therapy.  Plan:  Patient discharged from physical therapy to care of nursing for ambulation daily as tolerated for length of stay.      Follow Up Recommendations Outpatient PT    Equipment Recommendations  None recommended by PT    Recommendations for Other Services       Precautions / Restrictions Precautions Precautions: None Restrictions Weight Bearing Restrictions: No      Mobility  Bed Mobility Overal bed mobility: Modified Independent             General bed mobility comments: increased time  Transfers Overall transfer level: Modified independent Equipment used: None             General transfer comment: increased time, slightly labored movement  Ambulation/Gait Ambulation/Gait assistance: Supervision;Modified independent (Device/Increase time) Gait Distance (Feet): 80 Feet Assistive device: None Gait Pattern/deviations: Decreased step length - right;Decreased step length - left;Decreased stride length Gait velocity: decreased   General Gait Details: slightly labored cadence without loss of balance, limited mostly due to c/o fatigue  Stairs            Wheelchair Mobility    Modified Rankin (Stroke Patients Only)       Balance Overall balance assessment:  No apparent balance deficits (not formally assessed)                                           Pertinent Vitals/Pain Pain Assessment: No/denies pain    Home Living Family/patient expects to be discharged to:: Private residence Living Arrangements: Spouse/significant other Available Help at Discharge: Family;Available 24 hours/day Type of Home: House Home Access: Stairs to enter Entrance Stairs-Rails: None Entrance Stairs-Number of Steps: 1 Home Layout: One level Home Equipment: Cane - single point;Walker - 2 wheels;Bedside commode      Prior Function Level of Independence: Independent         Comments: Hydrographic surveyor, drives     Journalist, newspaper        Extremity/Trunk Assessment   Upper Extremity Assessment Upper Extremity Assessment: Overall WFL for tasks assessed    Lower Extremity Assessment Lower Extremity Assessment: Generalized weakness    Cervical / Trunk Assessment Cervical / Trunk Assessment: Normal  Communication   Communication: No difficulties  Cognition Arousal/Alertness: Awake/alert Behavior During Therapy: WFL for tasks assessed/performed Overall Cognitive Status: Within Functional Limits for tasks assessed                                        General Comments      Exercises     Assessment/Plan    PT Assessment All further PT needs can be met in the next  venue of care  PT Problem List Decreased mobility;Decreased strength;Decreased activity tolerance       PT Treatment Interventions      PT Goals (Current goals can be found in the Care Plan section)  Acute Rehab PT Goals Patient Stated Goal: return home and follow up with physical therapy for general strengthening PT Goal Formulation: With patient Time For Goal Achievement: 09/27/19 Potential to Achieve Goals: Good    Frequency     Barriers to discharge        Co-evaluation               AM-PAC PT "6 Clicks" Mobility   Outcome Measure Help needed turning from your back to your side while in a flat bed without using bedrails?: None Help needed moving from lying on your back to sitting on the side of a flat bed without using bedrails?: None Help needed moving to and from a bed to a chair (including a wheelchair)?: None Help needed standing up from a chair using your arms (e.g., wheelchair or bedside chair)?: None Help needed to walk in hospital room?: None Help needed climbing 3-5 steps with a railing? : A Little 6 Click Score: 23    End of Session   Activity Tolerance: Patient tolerated treatment well;Patient limited by fatigue Patient left: in chair;with call bell/phone within reach Nurse Communication: Mobility status PT Visit Diagnosis: Unsteadiness on feet (R26.81);Muscle weakness (generalized) (M62.81);Other abnormalities of gait and mobility (R26.89)    Time: 0258-5277 PT Time Calculation (min) (ACUTE ONLY): 22 min   Charges:   PT Evaluation $PT Eval Moderate Complexity: 1 Mod PT Treatments $Therapeutic Activity: 8-22 mins        11:36 AM, 09/27/19 Lonell Grandchild, MPT Physical Therapist with [facility] [phone] office [phone] mobile phone

## 2019-09-28 ENCOUNTER — Telehealth: Payer: Self-pay | Admitting: Adult Health

## 2019-09-28 NOTE — Telephone Encounter (Signed)
Spoke with patient. He stated that he was returning a call. He provided with the number of [phone]. Person on the VM advised him to call him to call our office to discuss a recent CT scan. Reviewed patient's chart with him over the phone. Did not see where anymore CT scans needed to be ordered. Went over the appointments with the patient and he is aware of those.   Told patient that I would advise him to regard the message. He verbalized understanding. Nothing further needed at time of call.

## 2019-10-03 NOTE — Telephone Encounter (Signed)
Glad to hear you are doing well It will take a while for clots to resolve , should slowly improve over next few weeks.   Make sure has follow up in 2 -3 weeks with Dr. Vaughan Browner or Mahlik Lenn NP   Please contact office for sooner follow up if symptoms do not improve or worsen or seek emergency care

## 2019-10-04 DIAGNOSIS — M1990 Unspecified osteoarthritis, unspecified site: Secondary | ICD-10-CM | POA: Diagnosis not present

## 2019-10-10 ENCOUNTER — Encounter (INDEPENDENT_AMBULATORY_CARE_PROVIDER_SITE_OTHER): Payer: Self-pay

## 2019-10-11 ENCOUNTER — Ambulatory Visit (HOSPITAL_COMMUNITY): Payer: Medicare Other | Attending: Family Medicine | Admitting: Physical Therapy

## 2019-10-11 ENCOUNTER — Other Ambulatory Visit: Payer: Self-pay

## 2019-10-11 ENCOUNTER — Encounter (HOSPITAL_COMMUNITY): Payer: Self-pay | Admitting: Physical Therapy

## 2019-10-11 DIAGNOSIS — R262 Difficulty in walking, not elsewhere classified: Secondary | ICD-10-CM | POA: Insufficient documentation

## 2019-10-11 DIAGNOSIS — R293 Abnormal posture: Secondary | ICD-10-CM | POA: Insufficient documentation

## 2019-10-11 NOTE — Patient Instructions (Addendum)
Abdominal Breathing Standing or Sitting    Place one hand over upper chest, other over stomach. Keep shoulders relaxed. Breathe in slowly, letting stomach move out. Keep upper chest quiet. Exhale gently through pursed lips, pulling abdominal muscles in. Breathe out for at least twice as long as you breathe in. Remember: Breathe in - tummy out, Breathe out - tummy in. Repeat _20___ times, ___3_ times daily.  Copyright  VHI. All rights reserved.  Scapular Retraction (Standing)    With arms at sides, pinch shoulder blades together. Repeat 10____ times per set. Do _1___ sets per session. Do _2___ sessions per day.  http://orth.exer.us/944   Copyright  VHI. All rights reserved.  Dressing    Lie propped against pillows to pull socks or slacks over feet. OR, while sitting on edge of bed or chair, bend knee and support foot while pulling on sock.   Copyright  VHI. All rights reserved.  Bathing    Make sure tub or shower has non-skid bottom, or use rubber mat to avoid slipping. Install safety bars for extra support. Use bath bench in shower or tub. Use long-handled brush or sponge to reach hard-to-reach areas.  Copyright  VHI. All rights reserved.  Toe Raise (Sitting)    Raise toes, keeping heels on floor.  Now raise your heels  Repeat ___10_ times per set. Do __1__ sets per session. Do __2__ sessions per day.  http://orth.exer.us/46   Copyright  VHI. All rights reserved.
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
## 2019-10-14 ENCOUNTER — Encounter (HOSPITAL_COMMUNITY): Admission: RE | Admit: 2019-10-14 | Payer: Medicare Other | Source: Ambulatory Visit

## 2019-10-14 ENCOUNTER — Inpatient Hospital Stay (HOSPITAL_COMMUNITY): Admission: RE | Admit: 2019-10-14 | Payer: Medicare Other | Source: Ambulatory Visit

## 2019-10-17 ENCOUNTER — Other Ambulatory Visit: Payer: Self-pay

## 2019-10-17 ENCOUNTER — Encounter (HOSPITAL_COMMUNITY)
Admission: RE | Admit: 2019-10-17 | Discharge: 2019-10-17 | Disposition: A | Discharge status: Home / Self Care | Payer: Medicare Other | Source: Ambulatory Visit | Attending: Internal Medicine | Admitting: Internal Medicine

## 2019-10-17 ENCOUNTER — Encounter (HOSPITAL_COMMUNITY): Payer: Self-pay

## 2019-10-17 DIAGNOSIS — K5190 Ulcerative colitis, unspecified, without complications: Secondary | ICD-10-CM | POA: Diagnosis not present

## 2019-10-17 MED ORDER — SODIUM CHLORIDE 0.9 % IV SOLN
Freq: Once | INTRAVENOUS | Status: AC
  Administered 2019-10-17: 11:00:00 via INTRAVENOUS

## 2019-10-17 MED ORDER — SODIUM CHLORIDE 0.9 % IV SOLN
5.0000 mg/kg | Freq: Once | INTRAVENOUS | Status: AC
  Administered 2019-10-17: 12:00:00 500 mg via INTRAVENOUS
  Filled 2019-10-17: qty 50

## 2019-10-18 ENCOUNTER — Ambulatory Visit (HOSPITAL_COMMUNITY): Payer: Medicare Other | Admitting: Physical Therapy

## 2019-10-18 DIAGNOSIS — R293 Abnormal posture: Secondary | ICD-10-CM

## 2019-10-18 DIAGNOSIS — R262 Difficulty in walking, not elsewhere classified: Secondary | ICD-10-CM | POA: Diagnosis not present
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
## 2019-10-18 NOTE — Patient Instructions (Signed)
repared By: Roseanne Reno  [address]  [place], [place]  Phone: [phone]    Sit to Stand without Arm Support REPS: 10 SETS: 1 DAILY: 1 WEEKLY: 7 Exercise image step1 Exercise image step2 Exercise image step3 Setup Begin by sitting upright on a chair with your feet slightly wider than shoulder width apart. Movement Reach out with your arms and lean forward at your hips until your bottom starts to lift off the chair. Move your body into a standing upright position, then reverse the order of your movements to return to the starting position. Tip Make sure not to let your knees collapse inward during the exercise. Doorway Pec Stretch at 90 Degrees Abduction REPS: 3 SETS: 1 DAILY: 1 WEEKLY: 7 Exercise image step1 Exercise image step2 Exercise image step3 Setup Begin in a standing upright position in the center of a doorway. Movement With your elbows bent, place your forearms on the sides of the doorway at a 90 degree angle from your sides, then take a small step forward until your feel a stretch in the front of your shoulders. Hold this position. Tip Make sure to maintain a gentle stretch and do not shrug your shoulders during the exercise. Disclaimer: This program provides exercises related to your condition that you can perform at home. As there is a risk of injury with any activity, use caution when performing exercises. If you experience any pain or discomfort, discontinue the exercises and contact your health care provider. Login URL: [email or web address]  .  Access Code: [email or web address] .   Date printed: 10/18/2019

## 2019-10-20 ENCOUNTER — Encounter (HOSPITAL_COMMUNITY): Payer: Self-pay

## 2019-10-20 ENCOUNTER — Ambulatory Visit (HOSPITAL_COMMUNITY): Payer: Medicare Other

## 2019-10-20 ENCOUNTER — Other Ambulatory Visit: Payer: Self-pay

## 2019-10-20 DIAGNOSIS — R293 Abnormal posture: Secondary | ICD-10-CM | POA: Diagnosis not present

## 2019-10-20 DIAGNOSIS — R262 Difficulty in walking, not elsewhere classified: Secondary | ICD-10-CM

## 2019-10-20 NOTE — Patient Instructions (Signed)
Standing tall with your back against the wall, raise your arms up while breathing in with raising up and exhaling while letting arms down.  Tandem Stance    Right foot in front of left, heel touching toe both feet "straight ahead". Stand on Foot Triangle of Support with both feet. Balance in this position 30 seconds. Do with left foot in front of right.  Copyright  VHI. All rights reserved.
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
## 2019-10-21 ENCOUNTER — Telehealth: Payer: Self-pay | Admitting: Pulmonary Disease

## 2019-10-21 ENCOUNTER — Ambulatory Visit (INDEPENDENT_AMBULATORY_CARE_PROVIDER_SITE_OTHER): Payer: Medicare Other

## 2019-10-21 ENCOUNTER — Other Ambulatory Visit: Payer: Self-pay

## 2019-10-21 ENCOUNTER — Other Ambulatory Visit (HOSPITAL_COMMUNITY)
Admission: RE | Admit: 2019-10-21 | Discharge: 2019-10-21 | Disposition: A | Discharge status: Home / Self Care | Payer: Medicare Other | Source: Ambulatory Visit | Attending: Pulmonary Disease | Admitting: Pulmonary Disease

## 2019-10-21 DIAGNOSIS — Z20828 Contact with and (suspected) exposure to other viral communicable diseases: Secondary | ICD-10-CM | POA: Diagnosis not present

## 2019-10-21 DIAGNOSIS — J849 Interstitial pulmonary disease, unspecified: Secondary | ICD-10-CM

## 2019-10-21 DIAGNOSIS — Z01812 Encounter for preprocedural laboratory examination: Secondary | ICD-10-CM | POA: Insufficient documentation

## 2019-10-21 LAB — SARS CORONAVIRUS 2 (TAT 6-24 HRS): SARS Coronavirus 2: NEGATIVE

## 2019-10-21 NOTE — Progress Notes (Signed)
SIX MIN WALK 10/21/2019 10/27/2018 05/18/2018 04/27/2018 01/15/2018  Medications Eliquis 92m, glipizide 2.526m losartan 10059mmetformin 500m86m 730 none Norvasc 1.25mg83m, Asa 81mg,28mlda 1.2g, Metformin 500mg, 46monix 40mg- a50maken approx 8:30 am.  - -  Supplimental Oxygen during Test? (L/min) No No No No No  Laps 8 10 6  - -  Partial Lap (in Meters) 0 23 0 - -  Baseline BP (sitting) 124/70 144/80 124/66 - -  Baseline Heartrate 88 84 87 - -  Baseline Dyspnea (Borg Scale) 3 3 2  - -  Baseline Fatigue (Borg Scale) 2 4 0.5 - -  Baseline SPO2 98 95 99 - -  BP (sitting) 140/80 160/92 132/70 - -  Heartrate 105 89 97 - -  Dyspnea (Borg Scale) 3 4 3  - -  Fatigue (Borg Scale) 3 4 2  - -  SPO2 95 94 100 - -  BP (sitting) 132/72 160/90 126/68 - -  Heartrate 95 88 77 - -  SPO2 98 98 98 - -  Stopped or Paused before Six Minutes No No No - -  Interpretation - Dizziness Hip pain - -  Distance Completed 272 363 288 - -  Tech Comments: Pt walked at a slow pace with a cane completing the entire 6 minutes having no complaints. - pt performed test with the use of a cane.  tolerated walk well.   pt walked a moderate pace with a cane, tolerated walk well -

## 2019-10-21 NOTE — Telephone Encounter (Signed)
Spoke with the pt  He does not know who called  I do not see where call was placed  Sonya already documented that his covid screen was neg  Nothing further needed

## 2019-10-24 ENCOUNTER — Ambulatory Visit: Payer: Medicare Other | Admitting: Adult Health

## 2019-10-24 ENCOUNTER — Ambulatory Visit (INDEPENDENT_AMBULATORY_CARE_PROVIDER_SITE_OTHER): Payer: Medicare Other | Admitting: Pulmonary Disease

## 2019-10-24 ENCOUNTER — Encounter: Payer: Self-pay | Admitting: Adult Health

## 2019-10-24 ENCOUNTER — Other Ambulatory Visit: Payer: Self-pay

## 2019-10-24 DIAGNOSIS — I2693 Single subsegmental pulmonary embolism without acute cor pulmonale: Secondary | ICD-10-CM

## 2019-10-24 DIAGNOSIS — G4733 Obstructive sleep apnea (adult) (pediatric): Secondary | ICD-10-CM | POA: Diagnosis not present

## 2019-10-24 DIAGNOSIS — J849 Interstitial pulmonary disease, unspecified: Secondary | ICD-10-CM | POA: Diagnosis not present

## 2019-10-24 DIAGNOSIS — R0609 Other forms of dyspnea: Secondary | ICD-10-CM

## 2019-10-24 LAB — PULMONARY FUNCTION TEST
DL/VA % pred: 98 %
DL/VA: 3.73 ml/min/mmHg/L
DLCO cor % pred: 57 %
DLCO cor: 14.41 ml/min/mmHg
DLCO unc % pred: 54 %
DLCO unc: 13.57 ml/min/mmHg
FEF 25-75 Post: 3.28 L/sec
FEF 25-75 Pre: 2.71 L/sec
FEF2575-%Change-Post: 21 %
FEF2575-%Pred-Post: 171 %
FEF2575-%Pred-Pre: 141 %
FEV1-%Change-Post: 8 %
FEV1-%Pred-Post: 70 %
FEV1-%Pred-Pre: 64 %
FEV1-Post: 2.03 L
FEV1-Pre: 1.87 L
FEV1FVC-%Change-Post: 3 %
FEV1FVC-%Pred-Pre: 121 %
FEV6-%Change-Post: 5 %
FEV6-%Pred-Post: 59 %
FEV6-%Pred-Pre: 56 %
FEV6-Post: 2.29 L
FEV6-Pre: 2.18 L
FEV6FVC-%Pred-Post: 107 %
FEV6FVC-%Pred-Pre: 107 %
FVC-%Change-Post: 4 %
FVC-%Pred-Post: 55 %
FVC-%Pred-Pre: 53 %
FVC-Post: 2.29 L
FVC-Pre: 2.18 L
Post FEV1/FVC ratio: 89 %
Post FEV6/FVC ratio: 100 %
Pre FEV1/FVC ratio: 86 %
Pre FEV6/FVC Ratio: 100 %
RV % pred: 47 %
RV: 1.33 L
TLC % pred: 49 %
TLC: 3.63 L

## 2019-10-24 MED ORDER — APIXABAN 5 MG PO TABS: 5.0000 mg | ORAL_TABLET | Freq: Two times a day (BID) | ORAL | 5 refills | Status: DC

## 2019-10-24 NOTE — Assessment & Plan Note (Signed)
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 He appears to be stable -PFTs today show no significant change in his lung function.  DLCO is actually improved from 2019. Patient remains on no supplemental oxygen. Patient is continue with his physical therapy and increase activity as tolerated. Seems to be improved since diagnosis of pulmonary embolism with improved activity tolerance and resolved exertional hypoxemia  Plan  Patient Instructions  May try ProAir 2 puffs every 4hrs as needed for shortness of breath /wheezing .  Continue on BIPAP At bedtime  .  Keep up good work.  Do not drive if sleepy  Activity as tolerated.  Mucinex DM as needed for cough and congestion Continue on Eliquis 87m Twice daily  .  Report any bleeding .  Do not take any NSAIDS (advil, ibuprofen, aleve, etc) .  Activity as tolerated.  Follow up in 3  months with Dr. MVaughan Browneror Taliya Mcclard NP and As needed

## 2019-10-24 NOTE — Assessment & Plan Note (Addendum)
Recent diagnosis of small subsegmental PE November 2020.  Patient with clinical improvement on Eliquis.  Hypoxemia has resolved. 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  For now we will continue on Eliquis for at least 6 to 9 months full dose.  Consider lifelong therapy and consideration of lower dose once completed full dose for allotted period.  Patient would be at more high risk due to age so lower dose may be more optimal choice Patient education given on Eliquis.  Advised on red flags to look out for and report immediately.  And also advised to not use any nonsteroidals.

## 2019-10-24 NOTE — Progress Notes (Signed)
Full PFT performed today. °

## 2019-10-24 NOTE — Progress Notes (Signed)
@Patient  ID: Manuel Atkins, male    DOB: 09/09/1933, 85 y.o.   MRN: [id number]  Chief Complaint  Patient presents with  . Follow-up    PE    Referring provider: Caryl Bis, MD  HPI: 85 year old male former smoker followed for bronchiectasis with interstitial lung disease (ILD risk factors include occupational, underlying autoimmune with ulcerative colitis, previous treatment with infliximab, mesalamine and previous chronic aspiration with esophageal stricture.).  Occupational exposure was to cotton, wood, dust, possible asbestos. Medical history significant for ulcerative colitis on Remicade and obstructive sleep apnea on BiPAP. Diagnosed with PE September 26, 2019.  Previous history of PE (unknown date ? 2000- 2005)   TEST/EVENTS :  CT scan 07/27/2008-mild basal atelectasis right upper lobe pulmonary embolism CT scan 11/16/17-mild bronchiectasis, basal reticulation right greater than left. Borderline right hilar lymphadenopathy. Nodular liver contour possible cirrhosis High-resolution CT 01/26/18-patchy ground glass attenuation, mild bronchiectasis, septal thickening with no basal gradient. No honeycombing. Indeterminate for UIP. Hepatic steatosis, aortic atherosclerosis, left main and left anterior coronary artery disease. High-resolution CT 05/04/2018-mild basal predominant lung fibrosis stable from prior exam. High-resolution CT12/07/2018-stable interstitial lung disease   Barium swallow 05/31/13-mild impairment of esophageal motility, stricture at GE junction with obstruction of barium tablet.  Labs Connective tissue serologies 11/23/17-ANA, ACE, CCP, rheumatoid factor all negative Hypersenitivity panel-negative  PFTs 12/23/17 FVC 2.30 (54%), FEV1 1.97 [66%], F/F 86, TLC 64%, DLCO 42% No obstruction, moderate restriction with severe diffusion defect  05/18/2018 FVC 2.32 [55%), FEV1 1.91 [65%], F/F 82, DLCO 43% Moderate restriction, severe diffusion  defect.  10/20/2018 FVC 2.30 [55%], FEV1 1.96 [6 6%], F/F 85, DLCO 46% Moderate restriction, severe diffusion defect.  6-minute walk 03/03/18-338 m 6-minute walk 05/20/18-288 m 6-minute walk 10/27/18- 363 m  Cardiac RHC 04/20/18 RA: 9 mmHg RV: 29/7 mmHg PA: 29/12 mmHg, mean PAP 19 mmHg PCWP: 9 mmHg LVEDP: 14 mmHg  CO: 4.8 L/min CI: 2.3 L/min/m2  Sleep Received copy of sleep study from [place] [facility], 08/01/2011 BiPAP titration. Titrated to IPAP 17, EPAP 13. Severe PLM's noted. Consider treatment for restless leg syndrome.  10/24/2019 Follow up : ILD, OSA and PE  Patient returns for a 1 month follow-up.  Patient is followed for bronchiectasis with ILD.  High-resolution CT chest December 2019 showed no significant progression of fibrotic changes .  Spectrum of findings classified as probable UIP however given stability and lack of honeycombing and presence of air trapping NSIP is the favored diagnosis. Last visit patient was having increased shortness of breath decreased activity tolerance And intermittent episodes of exertional hypoxemia.  At baseline patient is not on any oxygen.  Patient had a history of DVT and PE 15 to 20 years ago.  Patient was set up for a CT- a   PE protocol.  This showed a focal low-attenuation filling defect within a subsegmental branch of the right lower lobe pulmonary artery consistent with a PE. Patient was admitted and started on anticoagulation therapy with initial Lovenox and then transition to Eliquis.  Patient says since discharge he is starting to feel better.  His oxygen levels have remained above 90%.  His activity level and endurance is starting to pick back up.  Patient says he feels better than he has in a while.  Patient is doing physical therapy . Patient denies any hemoptysis, chest pain, orthopnea or increased edema. Patient did pulmonary function testing today which showed stable lung function and improved DLCO.  FVC 55%, ratio 89,  FEV1 70%, no  significant bronchodilator response, DLCO 54 (46% 2019)  Patient has underlying sleep apnea.  He is on nocturnal BiPAP.  Last visit patient's control was not optimal.  His BiPAP settings were changed to IPAP 16 and EPAP 12 cm H2O.  Today patient continues to have excellent compliance with daily average usage at 9.5 hours.  AHI is much improved at 2.6/hour.  Patient says he is feeling well and doing well on his BiPAP.  He feels that he is rested with no significant daytime sleepiness and has perceived benefit from his BiPAP.   Allergies  Allergen Reactions  . Mercaptopurine Other (See Comments)    High Fever, Chills, Fatigue    Immunization History  Administered Date(s) Administered  . Influenza, High Dose Seasonal PF 07/19/2018, 08/10/2019  . Influenza-Unspecified 10/09/2017  . PPD Test 11/01/2012  . Pneumococcal Polysaccharide-23 11/11/2007  . Tdap 11/11/2007    Past Medical History:  Diagnosis Date  . 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 Dyspnea   . Essential hypertension, benign   . GERD (gastroesophageal reflux disease)   . Headache(784.0)   . History of colon polyps   . History of transfusion of whole blood   . Interstitial lung disease ([place]) 12/01/2017  . Mixed hyperlipidemia   . Obstructive sleep apnea (adult) (pediatric)    uses bipap @ HS  . Other malaise and fatigue   . Thrombocytopenia ([place]) 11/10/2012  . Type II or unspecified type diabetes mellitus without mention of complication, uncontrolled   . 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HCC)     Tobacco History: Social History   Tobacco Use  Smoking Status Former Smoker  . Years: 20.00  . Types: Pipe, Cigars  . Quit date: 11/10/1968  . Years since quitting: 50.9  Smokeless Tobacco Never Used   Counseling given: Not Answered   Outpatient Medications Prior to Visit  Medication Sig Dispense Refill  . acetaminophen (TYLENOL) 500 MG tablet Take 1,000 mg by mouth every 6 (six) hours as needed for moderate  pain or headache.    . albuterol (PROAIR HFA) 108 (90 Base) MCG/ACT inhaler Inhale 1-2 puffs into the lungs every 6 (six) hours as needed for wheezing or shortness of breath. 8 g 2  . ALPRAZolam (XANAX) 0.25 MG tablet Take 0.5 tablets (0.125 mg total) by mouth 3 (three) times daily as needed for anxiety. for anxiety 135 tablet 1  . Apixaban Starter Pack (ELIQUIS DVT/PE STARTER PACK) 5 MG TBPK Take as directed on package: start with two-19m tablets twice daily for 7 days. On day 8, switch to one-522mtablet twice daily. 1 each 0  . diphenhydrAMINE (BENADRYL) 50 MG capsule Take 50 mg by mouth every 8 (eight) weeks.    . Marland KitchenLOMAX 0.4 MG CAPS capsule Take 0.4 mg by mouth daily after supper.     . Marland KitchenlipiZIDE (GLUCOTROL XL) 2.5 MG 24 hr tablet Take 2.5 mg by mouth daily with breakfast.     . imipramine (TOFRANIL) 25 MG tablet Take 75 mg by mouth at bedtime  0  . inFLIXimab in sodium chloride 0.9 % Inject 5 mg/kg into the vein every 8 (eight) weeks.    . Marland Kitchenatanoprost (XALATAN) 0.005 % ophthalmic solution Place 1 drop into both eyes at bedtime.  6  . loratadine (CLARITIN) 10 MG tablet Take 10 mg by mouth every 8 (eight) weeks.    . Marland Kitchenosartan (COZAAR) 100 MG tablet Take 100 mg by mouth daily.     . metFORMIN (GLUCOPHAGE-XR) 500 MG 24 hr tablet Take 500-1,000  mg by mouth See admin instructions. Patient takes 500 mg in the morning and 1000 mg at night.  1  . nitroGLYCERIN (NITROSTAT) 0.4 MG SL tablet Place 0.4 mg under the tongue every 5 (five) minutes as needed for chest pain.   1  . pantoprazole (PROTONIX) 40 MG tablet TAKE ONE TABLET BY MOUTH EVERY *OTHER* DAY. (Patient taking differently: Take 40 mg by mouth every other day. ) 45 tablet 3  . Probiotic Product (PROBIOTIC DAILY PO) Take 1 capsule by mouth daily.     . rosuvastatin (CRESTOR) 10 MG tablet Take 10 mg by mouth every evening.     Marland Kitchen aspirin EC 81 MG tablet Take 81 mg by mouth daily.     No facility-administered medications prior to visit.      Review of Systems:   Constitutional:   No  weight loss, night sweats,  Fevers, chills,  +fatigue, or  lassitude.  HEENT:   No headaches,  Difficulty swallowing,  Tooth/dental problems, or  Sore throat,                No sneezing, itching, ear ache, nasal congestion, post nasal drip,   CV:  No chest pain,  Orthopnea, PND, swelling in lower extremities, anasarca, dizziness, palpitations, syncope.   GI  No heartburn, indigestion, abdominal pain, nausea, vomiting, diarrhea, change in bowel habits, loss of appetite, bloody stools.   Resp:    No chest wall deformity  Skin: no rash or lesions.  GU: no dysuria, change in color of urine, no urgency or frequency.  No flank pain, no hematuria   MS:  No joint pain or swelling.  No decreased range of motion.  No back pain.    Physical Exam  BP 122/70 (BP Location: Left Arm, Cuff Size: Normal)   Pulse 96   Temp (!) 97 F (36.1 C) (Temporal)   Ht 5' 11.5" (1.816 m)   Wt 206 lb (93.4 kg)   SpO2 98% Comment: RA  BMI 28.33 kg/m   GEN: A/Ox3; pleasant , NAD, obese    HEENT:  [place]/AT,    NOSE-clear, THROAT-clear, no lesions, no postnasal drip or exudate noted.   NECK:  Supple w/ fair ROM; no JVD; normal carotid impulses w/o bruits; no thyromegaly or nodules palpated; no lymphadenopathy.    RESP  BB crackles   no accessory muscle use, no dullness to percussion  CARD:  RRR, no m/r/g, no peripheral edema, pulses intact, no cyanosis or clubbing.  GI:   Soft & nt; nml bowel sounds; no organomegaly or masses detected.   Musco: Warm bil, no deformities or joint swelling noted.   Neuro: alert, no focal deficits noted.    Skin: Warm, no lesions or rashes    Lab Results:  CBC   Imaging: CT Angio Chest W/Cm &/Or Wo Cm  Addendum Date: 09/26/2019   ADDENDUM REPORT: 09/26/2019 19:53 ADDENDUM: Critical Value/emergent results were called by telephone at the time of interpretation on 09/26/2019 at 7:52 pm to providerDr. Ina Kick, who  verbally acknowledged these results. Electronically Signed   By: Kerby Moors M.D.   On: 09/26/2019 19:53   Result Date: 09/26/2019 CLINICAL DATA:  Evaluate for pulmonary embolus. Dyspnea for 2 weeks. History of interstitial lung disease. EXAM: CT ANGIOGRAPHY CHEST WITH CONTRAST TECHNIQUE: Multidetector CT imaging of the chest was performed using the standard protocol during bolus administration of intravenous contrast. Multiplanar CT image reconstructions and MIPs were obtained to evaluate the vascular anatomy. CONTRAST:  145m OMNIPAQUE  IOHEXOL 350 MG/ML SOLN COMPARISON:  CT chest 10/18/2018 FINDINGS: Cardiovascular: The heart size appears within normal limits. Aortic atherosclerosis. Lad coronary artery calcification. No pericardial effusion identified. Exam detail is diminished secondary to respiratory motion artifact. The main pulmonary artery appears patent. No saddle embolus or central obstructing pulmonary embolus identified. No filling defects identified to the level of the segmental pulmonary arteries. There is a single small filling defect within a subsegmental branch of the right lower lobe pulmonary artery which is partially obscured by respiratory motion artifact,a image 87/10 and image 164/5. No additional focal pulmonary artery filling defects identified. Mediastinum/Nodes: No enlarged mediastinal, hilar, or axillary lymph nodes. Thyroid gland, trachea, and esophagus demonstrate no significant findings. Lungs/Pleura: No pleural effusion, airspace consolidation, or pneumothorax. Again seen are mild changes of chronic interstitial lung disease including lower lobe predominant interstitial reticulation and mild cylindrical bronchiectasis and peripheral bronchiolectasis. Upper Abdomen: No acute abnormality. Musculoskeletal: No chest wall abnormality. No acute or significant osseous findings. Review of the MIP images confirms the above findings. IMPRESSION: 1. Exam detail is diminished secondary to  respiratory motion artifact. 2. There is no central obstructing pulmonary embolus or abnormal filling defect to the level of the segmental pulmonary arteries to suggest a clinically significant acute pulmonary embolus. 3. There is a focal low-attenuation filling defect within a subsegmental branch of the right lower lobe pulmonary artery. This is an area of the lung which is obscured by motion artifact. This is equivocal for acute pulmonary embolus. 4. Mild changes of chronic interstitial lung disease. 5.  Aortic Atherosclerosis (ICD10-I70.0). 6. Coronary artery calcifications. Electronically Signed: By: Kerby Moors M.D. On: 09/26/2019 19:06      PFT Results Latest Ref Rng & Units 10/24/2019 10/20/2018 05/20/2018 12/23/2017  FVC-Pre L 2.18 2.30 2.32 2.44  FVC-Predicted Pre % 53 55 55 58  FVC-Post L 2.29 - - 2.30  FVC-Predicted Post % 55 - - 54  Pre FEV1/FVC % % 86 85 82 82  Post FEV1/FCV % % 89 - - 86  FEV1-Pre L 1.87 1.96 1.91 2.01  FEV1-Predicted Pre % 64 66 65 67  FEV1-Post L 2.03 - - 1.97  DLCO UNC% % 54 46 43 43  DLCO COR %Predicted % 98 91 79 75  TLC L 3.63 - - 4.77  TLC % Predicted % 49 - - 64  RV % Predicted % 47 - - 75    No results found for: NITRICOXIDE      Assessment & Plan:   ILD (interstitial lung disease) (HCC) Bronchiectasis with ILD probable NSIP Patient has multiple risk factors including underlying ulcerative colitis with good control on Remicade, previous occupational and environmental exposures. He appears to be stable -PFTs today show no significant change in his lung function.  DLCO is actually improved from 2019. Patient remains on no supplemental oxygen. Patient is continue with his physical therapy and increase activity as tolerated. Seems to be improved since diagnosis of pulmonary embolism with improved activity tolerance and resolved exertional hypoxemia  Plan  Patient Instructions  May try ProAir 2 puffs every 4hrs as needed for shortness of  breath /wheezing .  Continue on BIPAP At bedtime  .  Keep up good work.  Do not drive if sleepy  Activity as tolerated.  Mucinex DM as needed for cough and congestion Continue on Eliquis 54m Twice daily  .  Report any bleeding .  Do not take any NSAIDS (advil, ibuprofen, aleve, etc) .  Activity as tolerated.  Follow up  in 3  months with Dr. Vaughan Browner or Elzora Cullins NP and As needed         Pulmonary embolus [facility]) Recent diagnosis of small subsegmental PE November 2020.  Patient with clinical improvement on Eliquis.  Hypoxemia has resolved. 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  For now we will continue on Eliquis for at least 6 to 9 months full dose.  Consider lifelong therapy and consideration of lower dose once completed full dose for allotted period.  Patient would be at more high risk due to age so lower dose may be more optimal choice Patient education given on Eliquis.  Advised on red flags to look out for and report immediately.  And also advised to not use any nonsteroidals.  OSA (obstructive sleep apnea) Excellent control and compliance on BiPAP.  Recent BiPAP changes with significant improvement Continue current setting.  Plan  Patient Instructions  May try ProAir 2 puffs every 4hrs as needed for shortness of breath /wheezing .  Continue on BIPAP At bedtime  .  Keep up good work.  Do not drive if sleepy  Activity as tolerated.  Mucinex DM as needed for cough and congestion Continue on Eliquis 35m Twice daily  .  Report any bleeding .  Do not take any NSAIDS (advil, ibuprofen, aleve, etc) .  Activity as tolerated.  Follow up in 3  months with Dr. MVaughan Browneror Burnadette Baskett NP and As needed           TRexene Edison NP 10/24/2019

## 2019-10-24 NOTE — Patient Instructions (Addendum)
May try ProAir 2 puffs every 4hrs as needed for shortness of breath /wheezing .  Continue on BIPAP At bedtime  .  Keep up good work.  Do not drive if sleepy  Activity as tolerated.  Mucinex DM as needed for cough and congestion Continue on Eliquis 43m Twice daily  .  Report any bleeding .  Do not take any NSAIDS (advil, ibuprofen, aleve, etc) .  Activity as tolerated.  Follow up in 3  months with Dr. MVaughan Browneror Jayani Rozman NP and As needed

## 2019-10-24 NOTE — Assessment & Plan Note (Signed)
Excellent control and compliance on BiPAP.  Recent BiPAP changes with significant improvement Continue current setting.  Plan  Patient Instructions  May try ProAir 2 puffs every 4hrs as needed for shortness of breath /wheezing .  Continue on BIPAP At bedtime  .  Keep up good work.  Do not drive if sleepy  Activity as tolerated.  Mucinex DM as needed for cough and congestion Continue on Eliquis 57m Twice daily  .  Report any bleeding .  Do not take any NSAIDS (advil, ibuprofen, aleve, etc) .  Activity as tolerated.  Follow up in 3  months with Dr. MVaughan Browneror Aiyah Scarpelli NP and As needed

## 2019-10-25 ENCOUNTER — Ambulatory Visit (HOSPITAL_COMMUNITY): Payer: Medicare Other

## 2019-10-25 ENCOUNTER — Other Ambulatory Visit: Payer: Self-pay | Admitting: Cardiology

## 2019-10-25 ENCOUNTER — Encounter (HOSPITAL_COMMUNITY): Payer: Self-pay

## 2019-10-25 DIAGNOSIS — R262 Difficulty in walking, not elsewhere classified: Secondary | ICD-10-CM | POA: Diagnosis not present

## 2019-10-25 DIAGNOSIS — R293 Abnormal posture: Secondary | ICD-10-CM

## 2019-10-25 DIAGNOSIS — I2510 Atherosclerotic heart disease of native coronary artery without angina pectoris: Secondary | ICD-10-CM
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
## 2019-10-26 DIAGNOSIS — E1140 Type 2 diabetes mellitus with diabetic neuropathy, unspecified: Secondary | ICD-10-CM | POA: Diagnosis not present

## 2019-10-26 DIAGNOSIS — L110 Acquired keratosis follicularis: Secondary | ICD-10-CM | POA: Diagnosis not present

## 2019-10-26 DIAGNOSIS — B351 Tinea unguium: Secondary | ICD-10-CM | POA: Diagnosis not present

## 2019-10-27 ENCOUNTER — Encounter (HOSPITAL_COMMUNITY): Payer: Self-pay

## 2019-10-27 ENCOUNTER — Ambulatory Visit (HOSPITAL_COMMUNITY): Payer: Medicare Other

## 2019-10-27 ENCOUNTER — Other Ambulatory Visit: Payer: Self-pay

## 2019-10-27 DIAGNOSIS — R293 Abnormal posture: Secondary | ICD-10-CM | POA: Diagnosis not present

## 2019-10-27 DIAGNOSIS — R262 Difficulty in walking, not elsewhere classified: Secondary | ICD-10-CM | POA: Diagnosis not present

## 2019-10-27 DIAGNOSIS — I2699 Other pulmonary embolism without acute cor pulmonale: Secondary | ICD-10-CM | POA: Diagnosis not present
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
## 2019-11-01 ENCOUNTER — Other Ambulatory Visit: Payer: Self-pay

## 2019-11-01 ENCOUNTER — Ambulatory Visit (HOSPITAL_COMMUNITY): Payer: Medicare Other

## 2019-11-01 ENCOUNTER — Encounter (HOSPITAL_COMMUNITY): Payer: Self-pay

## 2019-11-01 DIAGNOSIS — R262 Difficulty in walking, not elsewhere classified: Secondary | ICD-10-CM | POA: Diagnosis not present

## 2019-11-01 DIAGNOSIS — R293 Abnormal posture: Secondary | ICD-10-CM
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
## 2019-11-02 ENCOUNTER — Ambulatory Visit (HOSPITAL_COMMUNITY): Payer: Medicare Other

## 2019-11-02 ENCOUNTER — Telehealth (HOSPITAL_COMMUNITY): Payer: Self-pay

## 2019-11-02 NOTE — Telephone Encounter (Signed)
He is not feeling well today and will not be able to come

## 2019-11-03 ENCOUNTER — Other Ambulatory Visit: Payer: Self-pay

## 2019-11-03 NOTE — Patient Outreach (Signed)
[facility]) Care Management  11/03/2019  Manuel Atkins 1933-05-23 [id number]   Medication Adherence call to [facility] spoke with patient he is past due on Losartan 100 mg ,patient explain he takes 1 tablet daily and between the pharmacy and him they keep up with his medications,patient said he has plenty at this time. Manuel Atkins is showing past due under [facility].  [facility] Direct Dial [phone]  Fax [phone] [email or web address]

## 2019-11-09 ENCOUNTER — Ambulatory Visit (HOSPITAL_COMMUNITY): Payer: Medicare Other

## 2019-11-09 ENCOUNTER — Other Ambulatory Visit: Payer: Self-pay

## 2019-11-09 ENCOUNTER — Encounter (HOSPITAL_COMMUNITY): Payer: Self-pay

## 2019-11-09 DIAGNOSIS — R293 Abnormal posture: Secondary | ICD-10-CM | POA: Diagnosis not present

## 2019-11-09 DIAGNOSIS — R262 Difficulty in walking, not elsewhere classified: Secondary | ICD-10-CM

## 2019-11-09 NOTE — Patient Instructions (Signed)
Band Walk: Side Stepping    Tie band around legs, just above knees. Step ___ feet to one side, then step back to start. Repeat ___ feet per session. Note: Small towel between band and skin eases rubbing.  http://plyo.exer.us/76   Copyright  VHI. All rights reserved.    Bridging    Slowly raise buttocks from floor, keeping stomach tight. Repeat 15 times per set. Do 2 sets per session.   http://orth.exer.us/1096   Copyright  VHI. All rights reserved.   FUNCTIONAL MOBILITY: Squat    Stance: shoulder-width on floor. Bend hips and knees. Keep back straight. Do not allow knees to bend past toes. Squeeze glutes and quads to stand. 10 reps per set, 2 sets per day, 4 days per week  Copyright  VHI. All rights reserved.
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
## 2019-11-10 ENCOUNTER — Encounter (HOSPITAL_COMMUNITY): Payer: Self-pay | Admitting: Physical Therapy

## 2019-11-10 ENCOUNTER — Ambulatory Visit (HOSPITAL_COMMUNITY): Payer: Medicare Other | Admitting: Physical Therapy

## 2019-11-10 ENCOUNTER — Telehealth (INDEPENDENT_AMBULATORY_CARE_PROVIDER_SITE_OTHER): Payer: Self-pay | Admitting: *Deleted

## 2019-11-10 DIAGNOSIS — E782 Mixed hyperlipidemia: Secondary | ICD-10-CM | POA: Diagnosis not present

## 2019-11-10 DIAGNOSIS — R262 Difficulty in walking, not elsewhere classified: Secondary | ICD-10-CM

## 2019-11-10 DIAGNOSIS — R293 Abnormal posture: Secondary | ICD-10-CM

## 2019-11-10 DIAGNOSIS — I2699 Other pulmonary embolism without acute cor pulmonale: Secondary | ICD-10-CM | POA: Diagnosis not present

## 2019-11-10 NOTE — Telephone Encounter (Signed)
Discuss with the patient Rem[facility] Infusion.
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
## 2019-11-15 ENCOUNTER — Encounter (HOSPITAL_COMMUNITY): Payer: Medicare Other | Admitting: Physical Therapy

## 2019-11-15 NOTE — Telephone Encounter (Signed)
Noted  

## 2019-11-15 NOTE — Telephone Encounter (Signed)
Patient's call returned yesterday. He has been in remission for over 6 years. He is presently only on infliximab. I am open to the idea of stopping infliximab. Next dose is not due for 3-1/2 weeks. I told him let us discuss this 1 more time 1 week before the next dose. I told him I hate for him to get a relapse during Covid-19 pandemic.

## 2019-11-17 ENCOUNTER — Encounter (HOSPITAL_COMMUNITY): Payer: Medicare Other | Admitting: Physical Therapy

## 2019-11-17 DIAGNOSIS — N183 Chronic kidney disease, stage 3 unspecified: Secondary | ICD-10-CM | POA: Diagnosis not present

## 2019-11-17 DIAGNOSIS — E871 Hypo-osmolality and hyponatremia: Secondary | ICD-10-CM | POA: Diagnosis not present

## 2019-11-17 DIAGNOSIS — E1122 Type 2 diabetes mellitus with diabetic chronic kidney disease: Secondary | ICD-10-CM | POA: Diagnosis not present

## 2019-11-17 DIAGNOSIS — I10 Essential (primary) hypertension: Secondary | ICD-10-CM | POA: Diagnosis not present

## 2019-11-17 DIAGNOSIS — K219 Gastro-esophageal reflux disease without esophagitis: Secondary | ICD-10-CM | POA: Diagnosis not present

## 2019-11-17 DIAGNOSIS — E782 Mixed hyperlipidemia: Secondary | ICD-10-CM | POA: Diagnosis not present

## 2019-11-22 ENCOUNTER — Encounter (HOSPITAL_COMMUNITY): Payer: Medicare Other | Admitting: Physical Therapy

## 2019-11-22 DIAGNOSIS — K5100 Ulcerative (chronic) pancolitis without complications: Secondary | ICD-10-CM | POA: Diagnosis not present

## 2019-11-22 DIAGNOSIS — E782 Mixed hyperlipidemia: Secondary | ICD-10-CM | POA: Diagnosis not present

## 2019-11-22 DIAGNOSIS — E1122 Type 2 diabetes mellitus with diabetic chronic kidney disease: Secondary | ICD-10-CM | POA: Diagnosis not present

## 2019-11-22 DIAGNOSIS — I10 Essential (primary) hypertension: Secondary | ICD-10-CM | POA: Diagnosis not present

## 2019-11-22 DIAGNOSIS — G43909 Migraine, unspecified, not intractable, without status migrainosus: Secondary | ICD-10-CM | POA: Diagnosis not present

## 2019-11-24 ENCOUNTER — Encounter (HOSPITAL_COMMUNITY): Payer: Medicare Other

## 2019-11-29 ENCOUNTER — Encounter (HOSPITAL_COMMUNITY): Payer: Medicare Other

## 2019-12-01 ENCOUNTER — Encounter (HOSPITAL_COMMUNITY): Payer: Medicare Other

## 2019-12-04 ENCOUNTER — Telehealth (INDEPENDENT_AMBULATORY_CARE_PROVIDER_SITE_OTHER): Payer: Self-pay | Admitting: Internal Medicine

## 2019-12-04 NOTE — Telephone Encounter (Signed)
I called back to talk with patient regarding infliximab. He has been in remission for 6 years.  He has no symptoms. He has very much interested in stopping the medication.  He understands the risk of relapse. We will change his appointment from February 2021 to June or July 2021. In the meantime if he develops diarrhea or rectal bleeding he will immediately call office.

## 2019-12-06 ENCOUNTER — Encounter (HOSPITAL_COMMUNITY): Payer: Medicare Other | Admitting: Physical Therapy

## 2019-12-08 ENCOUNTER — Encounter (HOSPITAL_COMMUNITY): Payer: Medicare Other | Admitting: Physical Therapy

## 2019-12-09 DIAGNOSIS — E7849 Other hyperlipidemia: Secondary | ICD-10-CM | POA: Diagnosis not present

## 2019-12-09 DIAGNOSIS — E1122 Type 2 diabetes mellitus with diabetic chronic kidney disease: Secondary | ICD-10-CM | POA: Diagnosis not present

## 2019-12-12 ENCOUNTER — Encounter (HOSPITAL_COMMUNITY): Payer: Medicare Other

## 2020-01-11 DIAGNOSIS — E1140 Type 2 diabetes mellitus with diabetic neuropathy, unspecified: Secondary | ICD-10-CM | POA: Diagnosis not present

## 2020-01-11 DIAGNOSIS — B351 Tinea unguium: Secondary | ICD-10-CM | POA: Diagnosis not present

## 2020-01-11 DIAGNOSIS — L110 Acquired keratosis follicularis: Secondary | ICD-10-CM | POA: Diagnosis not present

## 2020-01-17 ENCOUNTER — Ambulatory Visit (INDEPENDENT_AMBULATORY_CARE_PROVIDER_SITE_OTHER): Payer: Medicare Other | Admitting: Internal Medicine

## 2020-01-26 ENCOUNTER — Ambulatory Visit: Payer: Medicare Other | Admitting: Pulmonary Disease

## 2020-02-03 ENCOUNTER — Ambulatory Visit: Payer: Medicare Other | Admitting: Pulmonary Disease

## 2020-02-03 ENCOUNTER — Encounter: Payer: Self-pay | Admitting: Pulmonary Disease

## 2020-02-03 ENCOUNTER — Other Ambulatory Visit: Payer: Self-pay

## 2020-02-03 VITALS — BP 140/70 | HR 90 | Temp 97.6°F | Ht 71.5 in | Wt 208.8 lb

## 2020-02-03 DIAGNOSIS — G4733 Obstructive sleep apnea (adult) (pediatric): Secondary | ICD-10-CM | POA: Diagnosis not present

## 2020-02-03 NOTE — Patient Instructions (Signed)
We will schedule you for overnight oximetry on the BiPAP to make sure there are no desaturations at night Follow-up in 6 months.

## 2020-02-03 NOTE — Progress Notes (Signed)
Manuel Atkins    [id number]    [birth year]  Primary Care Physician:Daniel, Mitzie Na, MD  Referring Physician: Caryl Bis, MD [address] [place],  [place] [address]  Chief complaint: Follow up for interstitial lung disease.   HPI: 86 year old with history of OSA, ulcerative colitis, GERD, elevated transaminitis.  Hospitalized in early January 2019 at [facility] for dyspnea on exertion, hypoxia.  He had been evaluated with echo showing normal EF, proBNP was normal, d-dimer was normal.  He was started on Lasix without improvement.  He had a CT scan which showed mild bronchiectasis with basal interstitial lung disease.  He was started on 20 mg of prednisone and referred to pulmonary.    He was seen by pulmonary at [facility] but wants to transition care to [facility]. He follows with Dr. Laural Golden for ulcerative colitis and is on infliximab and mesalamine.  GERD is complicated by esophageal stricture status post dilation 4 years ago.  Symptoms are stable on PPI.  Elevated transaminases are thought to be secondary to fatty liver.  He still has occasional dysphagia and choking on food. Seen by Dr. Laural Golden and underwent dilation of esophageal stricture on 12/15/17.  Heartburn symptoms are controlled on therapy.  He is had a work-up by Dr. Virgina Jock, cardiology with right and left heart cath showing nonobstructive coronary artery disease, no evidence of pulmonary hypertension. Finished pulmonary rehab in 2019  Diagnosed with segmental pulmonary embolism in November 2020 and currently on Eliquis anticoagulation   Pets: Has dogs.  Exposed to farm animals in childhood.  No birds Occupation: Retired Art gallery manager.  Used to work in [employer]. Exposures: May have been exposed to asbestos.  Reports exposure to cotton dust.  He has a hobby of woodworking and is exposed to wood dust.  No mold, hot tubs. jacuzzi. Smoking history: 10-pack-year smoking history.  Quit in 1980  Travel History: Not  significant  Interim history: States that breathing is stable.  Has occasional cough with phlegm.  Tolerating BiPAP well with no issues Chief complaint is fatigue mostly in the daytime and morning when he wakes up.  States that it takes him up to mid morning before he gets going  He has discussed with his GI doctor and taken off infliximab in January 2021  Outpatient Encounter Medications as of 02/03/2020  Medication Sig  . acetaminophen (TYLENOL) 500 MG tablet Take 1,000 mg by mouth every 6 (six) hours as needed for moderate pain or headache.  . albuterol (PROAIR HFA) 108 (90 Base) MCG/ACT inhaler Inhale 1-2 puffs into the lungs every 6 (six) hours as needed for wheezing or shortness of breath.  . ALPRAZolam (XANAX) 0.25 MG tablet Take 0.5 tablets (0.125 mg total) by mouth 3 (three) times daily as needed for anxiety. for anxiety  . apixaban (ELIQUIS) 5 MG TABS tablet Take 1 tablet (5 mg total) by mouth 2 (two) times daily.  Marland Kitchen Apixaban Starter Pack (ELIQUIS DVT/PE STARTER PACK) 5 MG TBPK Take as directed on package: start with two-61m tablets twice daily for 7 days. On day 8, switch to one-565mtablet twice daily.  . diphenhydrAMINE (BENADRYL) 50 MG capsule Take 50 mg by mouth every 8 (eight) weeks.  . Mar[place] KitchenLOMAX 0.4 MG CAPS capsule Take 0.4 mg by mouth daily after supper.   . [place] KitchenlipiZIDE (GLUCOTROL XL) 2.5 MG 24 hr tablet Take 2.5 mg by mouth daily with breakfast.   . imipramine (TOFRANIL) 25 MG tablet Take  75 mg by mouth at bedtime  . inFLIXimab in sodium chloride 0.9 % Inject 5 mg/kg into the vein every 8 (eight) weeks.  Marland Kitchen latanoprost (XALATAN) 0.005 % ophthalmic solution Place 1 drop into both eyes at bedtime.  Marland Kitchen loratadine (CLARITIN) 10 MG tablet Take 10 mg by mouth every 8 (eight) weeks.  Marland Kitchen losartan (COZAAR) 100 MG tablet Take 100 mg by mouth daily.   . metFORMIN (GLUCOPHAGE-XR) 500 MG 24 hr tablet Take 500-1,000 mg by mouth See admin instructions. Patient takes 500 mg in the morning and 1000 mg  at night.  . nitroGLYCERIN (NITROSTAT) 0.4 MG SL tablet Place 0.4 mg under the tongue every 5 (five) minutes as needed for chest pain.   . pantoprazole (PROTONIX) 40 MG tablet TAKE ONE TABLET BY MOUTH EVERY *OTHER* DAY. (Patient taking differently: Take 40 mg by mouth every other day. )  . Probiotic Product (PROBIOTIC DAILY PO) Take 1 capsule by mouth daily.   . rosuvastatin (CRESTOR) 10 MG tablet Take 10 mg by mouth every evening.    No facility-administered encounter medications on file as of 02/03/2020.   Physical Exam: Blood pressure 140/70, pulse 90, temperature 97.6 F (36.4 C), temperature source Temporal, height 5' 11.5" (1.816 m), weight 208 lb 12.8 oz (94.7 kg), SpO2 90 %. Gen:      No acute distress HEENT:  EOMI, sclera anicteric Neck:     No masses; no thyromegaly Lungs:    Clear to auscultation bilaterally; normal respiratory effort CV:         Regular rate and rhythm; no murmurs Abd:      + bowel sounds; soft, non-tender; no palpable masses, no distension Ext:    No edema; adequate peripheral perfusion Skin:      Warm and dry; no rash Neuro: alert and oriented x 3 Psych: normal mood and affect  Data Reviewed: CT scan 07/27/2008-mild basal atelectasis right upper lobe pulmonary embolism CT scan 11/16/17-mild bronchiectasis, basal reticulation right greater than left.  Borderline right hilar lymphadenopathy.  Nodular liver contour possible cirrhosis High-resolution CT 01/26/18- patchy ground glass attenuation, mild bronchiectasis, septal thickening with no basal gradient.  No honeycombing. Indeterminate for UIP.  Hepatic steatosis, aortic atherosclerosis, left main and left anterior coronary artery disease. High-resolution CT 05/04/2018-mild basal predominant lung fibrosis stable from prior exam. High-resolution CT 10/18/2018-stable interstitial lung disease CTA 09/26/2019-focal filling defect within a subsegmental branch of the right lower lobe pulmonary artery.  Equivocal for PE I  have reviewed the images personally.  Barium swallow 05/31/13- mild impairment of esophageal motility, stricture at GE junction with obstruction of barium tablet.  Labs Connective tissue serologies 11/23/17-ANA, ACE, CCP, rheumatoid factor all negative Hypersenitivity panel-negative  PFTs  12/23/17 FVC 2.30 (54%), FEV1 1.97 [66%], F/F 86, TLC 64%, DLCO 42% No obstruction, moderate restriction with severe diffusion defect  05/18/2018 FVC 2.32 [55%), FEV1 1.91 [65%], F/F 82, DLCO 43% Moderate restriction, severe diffusion defect.  10/20/2018 FVC 2.30 [55%], FEV1 1.96 [6 6%], F/F 85, DLCO 46% Moderate restriction, severe diffusion defect.  10/24/2019 FVC 2.29 [5%], FEV1 2.03 [70%],/89, TLC 3.63 [49%], DLCO 13.57 [54%], Moderate restriction with severe diffusion defect.  Stable compared to 2019  6-minute walk 03/03/18- 338 m 6-minute walk 05/20/18- 288 m 6-minute walk 10/27/18- 363 m  Cardiac RHC 04/20/18 RA: 9 mmHg RV: 29/7 mmHg PA: 29/12 mmHg, mean PAP 19 mmHg PCWP: 9 mmHg LVEDP: 14 mmHg  CO: 4.8 L/min CI: 2.3 L/min/m2  Sleep Received copy of sleep study from  [facility], 08/01/2011 BiPAP titration.  Titrated to IPAP 17, EPAP 13.  Severe PLM's noted.  Consider treatment for restless leg syndrome.  Assessment:  Follow-up for bronchiectasis, interstitial lung disease. He could have interstitial lung disease from occupational, recreational exposure to cotton dust, wood dust, possible asbestos.  Other considerations include pneumonitis from ulcerative colitis or from infliximab, meslamine therapy or chronic aspiration from esophageal stricture.  Connective tissue serologies are negative. There is no clear evidence that he has IPF.   Cardiac work-up as noted above with no evidence of significant coronary artery disease or pulmonary hypertension. Cardio pulmonary exercise test shows dyspnea likely from combination of cardiac, pulmonary factors. For definite diagnosis of ILD  and treatment he will need an open lung biopsy. We had an extensive discussion about risks benefits of the biopsy versus empirically treating him with anti-fibrotics Discussed the option of Envisia test through bronchoscope but he would like to defer all biopsies since his symptoms are stable  Pulmonary embolism Findings are equivocal with nonspecific filling defects. Would continue anticoagulation given his underlying lung issues and significant dyspnea  OSA Continues on BiPAP Still has fatigue when he wakes up. Check overnight oximetry on BiPAP.  Plan/Recommendations: - Overnight oximetry - Continue BiPAP  Marshell Garfinkel MD [facility] 02/03/2020, 10:44 AM  CC: Caryl Bis, MD

## 2020-02-08 ENCOUNTER — Telehealth: Payer: Self-pay | Admitting: Pulmonary Disease

## 2020-02-08 DIAGNOSIS — E7849 Other hyperlipidemia: Secondary | ICD-10-CM | POA: Diagnosis not present

## 2020-02-08 DIAGNOSIS — E1122 Type 2 diabetes mellitus with diabetic chronic kidney disease: Secondary | ICD-10-CM | POA: Diagnosis not present

## 2020-02-08 NOTE — Telephone Encounter (Signed)
Called and spoke with Patient.  Patient stated no one has contacted him about his ONO.  Patient stated Dr. Vaughan Browner ordered ONO last week.  Explained someone will call him from DME to schedule. Understanding stated.

## 2020-02-09 ENCOUNTER — Telehealth: Payer: Self-pay | Admitting: Pulmonary Disease

## 2020-02-09 DIAGNOSIS — J849 Interstitial pulmonary disease, unspecified: Secondary | ICD-10-CM

## 2020-02-09 NOTE — Telephone Encounter (Signed)
Spoke with pt. States that Dr. Vaughan Browner wanted him to have an ONO. The order was sent to [facility] in [place]. I called to confirm but [facility] does not do ONOs. A new order has been placed for an ONO to be done with [facility] in [place]; this is the closest location to the pt that does them. Pt was appreciative and thanked me for our time and help. Nothing further was needed.

## 2020-02-17 ENCOUNTER — Encounter: Payer: Self-pay | Admitting: Pulmonary Disease

## 2020-02-17 DIAGNOSIS — R0902 Hypoxemia: Secondary | ICD-10-CM | POA: Diagnosis not present

## 2020-02-17 DIAGNOSIS — J449 Chronic obstructive pulmonary disease, unspecified: Secondary | ICD-10-CM | POA: Diagnosis not present

## 2020-03-05 DIAGNOSIS — E11319 Type 2 diabetes mellitus with unspecified diabetic retinopathy without macular edema: Secondary | ICD-10-CM | POA: Diagnosis not present

## 2020-03-05 DIAGNOSIS — H524 Presbyopia: Secondary | ICD-10-CM | POA: Diagnosis not present

## 2020-03-06 DIAGNOSIS — G4733 Obstructive sleep apnea (adult) (pediatric): Secondary | ICD-10-CM | POA: Diagnosis not present

## 2020-03-09 DIAGNOSIS — N183 Chronic kidney disease, stage 3 unspecified: Secondary | ICD-10-CM | POA: Diagnosis not present

## 2020-03-09 DIAGNOSIS — I120 Hypertensive chronic kidney disease with stage 5 chronic kidney disease or end stage renal disease: Secondary | ICD-10-CM | POA: Diagnosis not present

## 2020-03-09 DIAGNOSIS — E1122 Type 2 diabetes mellitus with diabetic chronic kidney disease: Secondary | ICD-10-CM | POA: Diagnosis not present

## 2020-03-21 DIAGNOSIS — K219 Gastro-esophageal reflux disease without esophagitis: Secondary | ICD-10-CM | POA: Diagnosis not present

## 2020-03-21 DIAGNOSIS — E1165 Type 2 diabetes mellitus with hyperglycemia: Secondary | ICD-10-CM | POA: Diagnosis not present

## 2020-03-21 DIAGNOSIS — R5383 Other fatigue: Secondary | ICD-10-CM | POA: Diagnosis not present

## 2020-03-21 DIAGNOSIS — N183 Chronic kidney disease, stage 3 unspecified: Secondary | ICD-10-CM | POA: Diagnosis not present

## 2020-03-21 DIAGNOSIS — Z0001 Encounter for general adult medical examination with abnormal findings: Secondary | ICD-10-CM | POA: Diagnosis not present

## 2020-03-21 DIAGNOSIS — R946 Abnormal results of thyroid function studies: Secondary | ICD-10-CM | POA: Diagnosis not present

## 2020-03-21 DIAGNOSIS — E782 Mixed hyperlipidemia: Secondary | ICD-10-CM | POA: Diagnosis not present

## 2020-03-21 DIAGNOSIS — I10 Essential (primary) hypertension: Secondary | ICD-10-CM | POA: Diagnosis not present

## 2020-03-28 DIAGNOSIS — I10 Essential (primary) hypertension: Secondary | ICD-10-CM | POA: Diagnosis not present

## 2020-03-28 DIAGNOSIS — K5100 Ulcerative (chronic) pancolitis without complications: Secondary | ICD-10-CM | POA: Diagnosis not present

## 2020-03-28 DIAGNOSIS — E1122 Type 2 diabetes mellitus with diabetic chronic kidney disease: Secondary | ICD-10-CM | POA: Diagnosis not present

## 2020-03-28 DIAGNOSIS — E782 Mixed hyperlipidemia: Secondary | ICD-10-CM | POA: Diagnosis not present

## 2020-03-28 DIAGNOSIS — M25561 Pain in right knee: Secondary | ICD-10-CM | POA: Diagnosis not present

## 2020-03-28 DIAGNOSIS — D696 Thrombocytopenia, unspecified: Secondary | ICD-10-CM | POA: Diagnosis not present

## 2020-03-28 DIAGNOSIS — G43909 Migraine, unspecified, not intractable, without status migrainosus: Secondary | ICD-10-CM | POA: Diagnosis not present

## 2020-03-28 DIAGNOSIS — G4733 Obstructive sleep apnea (adult) (pediatric): Secondary | ICD-10-CM | POA: Diagnosis not present

## 2020-03-28 DIAGNOSIS — Z0001 Encounter for general adult medical examination with abnormal findings: Secondary | ICD-10-CM | POA: Diagnosis not present

## 2020-03-28 DIAGNOSIS — M549 Dorsalgia, unspecified: Secondary | ICD-10-CM | POA: Diagnosis not present

## 2020-04-05 DIAGNOSIS — E1140 Type 2 diabetes mellitus with diabetic neuropathy, unspecified: Secondary | ICD-10-CM | POA: Diagnosis not present

## 2020-04-05 DIAGNOSIS — B351 Tinea unguium: Secondary | ICD-10-CM | POA: Diagnosis not present

## 2020-04-05 DIAGNOSIS — L110 Acquired keratosis follicularis: Secondary | ICD-10-CM | POA: Diagnosis not present

## 2020-04-06 ENCOUNTER — Other Ambulatory Visit: Payer: Self-pay | Admitting: Adult Health

## 2020-05-01 ENCOUNTER — Ambulatory Visit (INDEPENDENT_AMBULATORY_CARE_PROVIDER_SITE_OTHER): Payer: Medicare Other | Admitting: Internal Medicine

## 2020-05-01 ENCOUNTER — Other Ambulatory Visit: Payer: Self-pay

## 2020-05-01 ENCOUNTER — Encounter (INDEPENDENT_AMBULATORY_CARE_PROVIDER_SITE_OTHER): Payer: Self-pay | Admitting: Internal Medicine

## 2020-05-01 VITALS — BP 119/70 | HR 101 | Temp 97.7°F | Resp 18 | Ht 71.0 in | Wt 207.8 lb

## 2020-05-01 DIAGNOSIS — K5100 Ulcerative (chronic) pancolitis without complications: Secondary | ICD-10-CM | POA: Diagnosis not present

## 2020-05-01 DIAGNOSIS — K219 Gastro-esophageal reflux disease without esophagitis: Secondary | ICD-10-CM

## 2020-05-01 NOTE — Progress Notes (Signed)
Presenting complaint;  Follow-up for ulcerative colitis and GERD.  Database and subjective:  Patient is 86 year old Caucasian male who has history of ulcerative colitis dating back to 1979 who has had few relapses relapses along the way as well as superadded CMV colitis over 6 years ago.  His disease could not be controlled with mesalamine.  He did quite well with infliximab for few years.  Last dose of infliximab was in October 2020.  It was decided to stop this medication since he had been in remission for so many years.  He was also worried about potential side effects.  He states he has had no problems since stopping Remicade.  Bowels move daily.  He has formed stool.  He denies abdominal pain melena or rectal bleeding.  His appetite is good.  His weight has been stable.  He says heartburn is well controlled with PPI every other day but he does have intermittent dysphagia but he does not feel that his progress and he has reached a point to have his esophagus dilated.  Distal esophageal stricture was last dilated in February 20 19 to 18 mm with a balloon.  As far as his lung disease is concerned he remains with dyspnea on minimal exertion.  He says there has been no change in his breathing since his last visit.  He does have cough with production of some sputum.  He denies chest pain fever or chills.  He says he had developed pruritus which has responded to antiallergic.  Current Medications: Outpatient Encounter Medications as of 05/01/2020  Medication Sig  . acetaminophen (TYLENOL) 500 MG tablet Take 1,000 mg by mouth every 6 (six) hours as needed for moderate pain or headache.  . ALPRAZolam (XANAX) 0.25 MG tablet Take 0.5 tablets (0.125 mg total) by mouth 3 (three) times daily as needed for anxiety. for anxiety  . diphenhydrAMINE (BENADRYL) 50 MG capsule Take 50 mg by mouth every 8 (eight) weeks.  Marland Kitchen ELIQUIS 5 MG TABS tablet TAKE ONE TABLET BY MOUTH TWICE DAILY.  Marland Kitchen glipiZIDE (GLUCOTROL XL) 2.5  MG 24 hr tablet Take 2.5 mg by mouth daily with breakfast.   . imipramine (TOFRANIL) 25 MG tablet Take 75 mg by mouth at bedtime  . latanoprost (XALATAN) 0.005 % ophthalmic solution Place 1 drop into both eyes at bedtime.  Marland Kitchen loratadine (CLARITIN) 10 MG tablet Take 10 mg by mouth every 8 (eight) weeks.  Marland Kitchen losartan (COZAAR) 100 MG tablet Take 100 mg by mouth daily.   . metFORMIN (GLUCOPHAGE-XR) 500 MG 24 hr tablet Take 500-1,000 mg by mouth See admin instructions. Patient takes 500 mg in the morning and 1000 mg at night.  . pantoprazole (PROTONIX) 40 MG tablet TAKE ONE TABLET BY MOUTH EVERY *OTHER* DAY. (Patient taking differently: Take 40 mg by mouth every other day. )  . Probiotic Product (PROBIOTIC DAILY PO) Take 1 capsule by mouth daily.   . rosuvastatin (CRESTOR) 10 MG tablet Take 10 mg by mouth every evening.   Marland Kitchen albuterol (PROAIR HFA) 108 (90 Base) MCG/ACT inhaler Inhale 1-2 puffs into the lungs every 6 (six) hours as needed for wheezing or shortness of breath. (Patient not taking: Reported on 05/01/2020)  . Apixaban Starter Pack (ELIQUIS DVT/PE STARTER PACK) 5 MG TBPK Take as directed on package: start with two-110m tablets twice daily for 7 days. On day 8, switch to one-568mtablet twice daily. (Patient not taking: Reported on 05/01/2020)  . FLOMAX 0.4 MG CAPS capsule Take 0.4 mg by mouth  daily after supper.  (Patient not taking: Reported on 05/01/2020)  . inFLIXimab in sodium chloride 0.9 % Inject 5 mg/kg into the vein every 8 (eight) weeks. (Patient not taking: Reported on 05/01/2020)  . nitroGLYCERIN (NITROSTAT) 0.4 MG SL tablet Place 0.4 mg under the tongue every 5 (five) minutes as needed for chest pain.  (Patient not taking: Reported on 05/01/2020)   No facility-administered encounter medications on file as of 05/01/2020.     Objective: Blood pressure 119/70, pulse (!) 101, temperature 97.7 F (36.5 C), temperature source Oral, resp. rate 18, height _0  (1.803 m), weight 207 lb 12.8 oz  (94.3 kg). Patient is alert and in no acute distress. He is wearing a mask. Conjunctiva is pink. Sclera is nonicteric Oropharyngeal mucosa is normal. No neck masses or thyromegaly noted. Cardiac exam with regular rhythm normal S1 and S2. No murmur or gallop noted. Auscultation of lungs reveal dry crackles at bases. Abdomen is full but soft and nontender with no organomegaly or masses. Trace edema around right ankle.  Labs/studies Results:  CBC Latest Ref Rng & Units 09/27/2019 09/26/2019 06/23/2019  WBC 4.0 - 10.5 K/uL 7.5 8.2 7.8  Hemoglobin 13.0 - 17.0 g/dL 12.7(L) 13.6 14.3  Hematocrit 39 - 52 % 38.7(L) 39.8 41.9  Platelets 150 - 400 K/uL 167 168 180.0    CMP Latest Ref Rng & Units 09/27/2019 09/26/2019 09/26/2019  Glucose 70 - 99 mg/dL 197(H) 116(H) -  BUN 8 - 23 mg/dL 25(H) 23 -  Creatinine 0.61 - 1.24 mg/dL 1.66(H) 1.70(H) 1.65(H)  Sodium 135 - 145 mmol/L 137 135 -  Potassium 3.5 - 5.1 mmol/L 4.2 4.4 -  Chloride 98 - 111 mmol/L 103 101 -  CO2 22 - 32 mmol/L 24 25 -  Calcium 8.9 - 10.3 mg/dL 8.6(L) 9.2 -  Total Protein 6.0 - 8.3 g/dL - - -  Total Bilirubin 0.2 - 1.2 mg/dL - - -  Alkaline Phos 39 - 117 U/L - - -  AST 0 - 37 U/L - - -  ALT 0 - 53 U/L - - -    Hepatic Function Latest Ref Rng & Units 06/23/2019 11/08/2012 09/24/2012  Total Protein 6.0 - 8.3 g/dL 8.4(H) 6.6 6.9  Albumin 3.5 - 5.2 g/dL 4.9 2.7(L) 3.6  AST 0 - 37 U/L _1 ALT 0 - 53 U/L 35 24 39  Alk Phosphatase 39 - 117 U/L 57 52 52  Total Bilirubin 0.2 - 1.2 mg/dL 1.1 0.9 1.2    Lab Results  Component Value Date   CRP 0.7 11/23/2017      Assessment:  #1.  Chronic ulcerative colitis.  Disease duration 42 years.  He has been off infliximab for 8 months and has not had any symptoms suggest relapse.  We have not undertaken surveillance colonoscopy because of his age.  #2.  GERD complicated by distal esophageal stricture last dilated in February 2019.  He is having sporadic dysphagia.  If dysphagia  worsens would consider esophageal dilation.   Plan:  Patient will call office if he has diarrhea rectal bleeding or dysphagia. Office visit in 1 year.

## 2020-05-01 NOTE — Patient Instructions (Signed)
Please call office if swallowing difficulty worsens or if you experience diarrhea or rectal bleeding.

## 2020-05-01 NOTE — Addendum Note (Signed)
Addended by: Rogene Houston on: 05/01/2020 10:35 AM   Modules accepted: Orders

## 2020-05-07 DIAGNOSIS — S20219A Contusion of unspecified front wall of thorax, initial encounter: Secondary | ICD-10-CM | POA: Diagnosis not present

## 2020-05-07 DIAGNOSIS — J849 Interstitial pulmonary disease, unspecified: Secondary | ICD-10-CM | POA: Diagnosis not present

## 2020-05-07 DIAGNOSIS — R079 Chest pain, unspecified: Secondary | ICD-10-CM | POA: Diagnosis not present

## 2020-05-09 DIAGNOSIS — E1122 Type 2 diabetes mellitus with diabetic chronic kidney disease: Secondary | ICD-10-CM | POA: Diagnosis not present

## 2020-05-09 DIAGNOSIS — E7849 Other hyperlipidemia: Secondary | ICD-10-CM | POA: Diagnosis not present

## 2020-05-09 DIAGNOSIS — I129 Hypertensive chronic kidney disease with stage 1 through stage 4 chronic kidney disease, or unspecified chronic kidney disease: Secondary | ICD-10-CM | POA: Diagnosis not present

## 2020-05-09 DIAGNOSIS — N183 Chronic kidney disease, stage 3 unspecified: Secondary | ICD-10-CM | POA: Diagnosis not present

## 2020-06-13 DIAGNOSIS — L570 Actinic keratosis: Secondary | ICD-10-CM | POA: Diagnosis not present

## 2020-06-13 DIAGNOSIS — Z1283 Encounter for screening for malignant neoplasm of skin: Secondary | ICD-10-CM | POA: Diagnosis not present

## 2020-06-13 DIAGNOSIS — D225 Melanocytic nevi of trunk: Secondary | ICD-10-CM | POA: Diagnosis not present

## 2020-06-13 DIAGNOSIS — Z08 Encounter for follow-up examination after completed treatment for malignant neoplasm: Secondary | ICD-10-CM | POA: Diagnosis not present

## 2020-06-13 DIAGNOSIS — Z85820 Personal history of malignant melanoma of skin: Secondary | ICD-10-CM | POA: Diagnosis not present

## 2020-06-13 DIAGNOSIS — L308 Other specified dermatitis: Secondary | ICD-10-CM | POA: Diagnosis not present

## 2020-06-20 DIAGNOSIS — L110 Acquired keratosis follicularis: Secondary | ICD-10-CM | POA: Diagnosis not present

## 2020-06-20 DIAGNOSIS — E1140 Type 2 diabetes mellitus with diabetic neuropathy, unspecified: Secondary | ICD-10-CM | POA: Diagnosis not present

## 2020-06-20 DIAGNOSIS — B351 Tinea unguium: Secondary | ICD-10-CM | POA: Diagnosis not present

## 2020-07-10 DIAGNOSIS — N183 Chronic kidney disease, stage 3 unspecified: Secondary | ICD-10-CM | POA: Diagnosis not present

## 2020-07-10 DIAGNOSIS — E7849 Other hyperlipidemia: Secondary | ICD-10-CM | POA: Diagnosis not present

## 2020-07-10 DIAGNOSIS — G4733 Obstructive sleep apnea (adult) (pediatric): Secondary | ICD-10-CM | POA: Diagnosis not present

## 2020-07-10 DIAGNOSIS — E1122 Type 2 diabetes mellitus with diabetic chronic kidney disease: Secondary | ICD-10-CM | POA: Diagnosis not present

## 2020-07-10 DIAGNOSIS — I129 Hypertensive chronic kidney disease with stage 1 through stage 4 chronic kidney disease, or unspecified chronic kidney disease: Secondary | ICD-10-CM | POA: Diagnosis not present

## 2020-07-23 ENCOUNTER — Other Ambulatory Visit: Payer: Self-pay

## 2020-07-23 ENCOUNTER — Ambulatory Visit: Payer: Medicare Other | Admitting: Pulmonary Disease

## 2020-07-23 ENCOUNTER — Encounter: Payer: Self-pay | Admitting: Pulmonary Disease

## 2020-07-23 VITALS — BP 130/72 | HR 72 | Temp 96.1°F | Ht 71.5 in | Wt 207.0 lb

## 2020-07-23 DIAGNOSIS — G4733 Obstructive sleep apnea (adult) (pediatric): Secondary | ICD-10-CM

## 2020-07-23 DIAGNOSIS — J849 Interstitial pulmonary disease, unspecified: Secondary | ICD-10-CM

## 2020-07-23 NOTE — Progress Notes (Signed)
Manuel Atkins    [id number]    06-04-1933  Primary Care Physician:Daniel, Mitzie Na, MD  Referring Physician: Caryl Bis, MD [address] [place],  [place] [address]  Chief complaint: Follow up for interstitial lung disease.   HPI: 86 year old with history of OSA, ulcerative colitis, GERD, elevated transaminitis.  Hospitalized in early January 2019 at [facility] for dyspnea on exertion, hypoxia.  He had been evaluated with echo showing normal EF, proBNP was normal, d-dimer was normal.  He was started on Lasix without improvement.  He had a CT scan which showed mild bronchiectasis with basal interstitial lung disease.  He was started on 20 mg of prednisone and referred to pulmonary.    He was seen by pulmonary at [facility] but wants to transition care to [facility]. He follows with Dr. Laural Golden for ulcerative colitis and is on infliximab and mesalamine.  GERD is complicated by esophageal stricture status post dilation 4 years ago.  Symptoms are stable on PPI.  Elevated transaminases are thought to be secondary to fatty liver.  He still has occasional dysphagia and choking on food. Seen by Dr. Laural Golden and underwent dilation of esophageal stricture on 12/15/17.  Heartburn symptoms are controlled on therapy.  He is had a work-up by Dr. Virgina Jock, cardiology with right and left heart cath showing nonobstructive coronary artery disease, no evidence of pulmonary hypertension. Finished pulmonary rehab in 2019  Diagnosed with segmental pulmonary embolism in November 2020 and currently on Eliquis anticoagulation.  He has discussed with his GI doctor and taken off infliximab in January 2021   Pets: Has dogs.  Exposed to farm animals in childhood.  No birds Occupation: Retired Art gallery manager.  Used to work in [employer]. Exposures: May have been exposed to asbestos.  Reports exposure to cotton dust.  He has a hobby of woodworking and is exposed to wood dust.  No mold, hot tubs.  jacuzzi. Smoking history: 10-pack-year smoking history.  Quit in 1980  Travel History: Not significant  Interim history: Continues to have dyspnea on exertion.  States that it may be slightly worse compared to before. No new complaints Using the BiPAP and is doing well with it.  Had an overnight oximetry done several months ago but apparently there is a problem with completion of the study.  No records available of this test in epic  Outpatient Encounter Medications as of 07/23/2020  Medication Sig  . acetaminophen (TYLENOL) 500 MG tablet Take 1,000 mg by mouth every 6 (six) hours as needed for moderate pain or headache.  . ALPRAZolam (XANAX) 0.25 MG tablet Take 0.5 tablets (0.125 mg total) by mouth 3 (three) times daily as needed for anxiety. for anxiety  . diphenhydrAMINE (BENADRYL) 50 MG capsule Take 50 mg by mouth every 8 (eight) weeks.  Marland Kitchen ELIQUIS 5 MG TABS tablet TAKE ONE TABLET BY MOUTH TWICE DAILY.  Marland Kitchen glipiZIDE (GLUCOTROL XL) 2.5 MG 24 hr tablet Take 2.5 mg by mouth daily with breakfast.   . imipramine (TOFRANIL) 25 MG tablet Take 75 mg by mouth at bedtime  . latanoprost (XALATAN) 0.005 % ophthalmic solution Place 1 drop into both eyes at bedtime.  Marland Kitchen loratadine (CLARITIN) 10 MG tablet Take 10 mg by mouth every 8 (eight) weeks.  . metFORMIN (GLUCOPHAGE-XR) 500 MG 24 hr tablet Take 500-1,000 mg by mouth See admin instructions. Patient takes 500 mg in the morning and 1000 mg at night.  . pantoprazole (PROTONIX) 40 MG  tablet TAKE ONE TABLET BY MOUTH EVERY *OTHER* DAY. (Patient taking differently: Take 40 mg by mouth every other day. )  . Probiotic Product (PROBIOTIC DAILY PO) Take 1 capsule by mouth daily.   . rosuvastatin (CRESTOR) 10 MG tablet Take 10 mg by mouth every evening.   . tamsulosin (FLOMAX) 0.4 MG CAPS capsule Take 0.4 mg by mouth at bedtime.  . [DISCONTINUED] Apixaban Starter Pack (ELIQUIS DVT/PE STARTER PACK) 5 MG TBPK Take as directed on package: start with two-73m tablets  twice daily for 7 days. On day 8, switch to one-553mtablet twice daily.   No facility-administered encounter medications on file as of 07/23/2020.   Physical Exam: Blood pressure 130/72, pulse 72, temperature (!) 96.1 F (35.6 C), temperature source Oral, height 5' 11.5" (1.816 m), weight 207 lb (93.9 kg), SpO2 98 %. Gen:      No acute distress HEENT:  EOMI, sclera anicteric Neck:     No masses; no thyromegaly Lungs:    Clear to auscultation bilaterally; normal respiratory effort CV:         Regular rate and rhythm; no murmurs Abd:      + bowel sounds; soft, non-tender; no palpable masses, no distension Ext:    No edema; adequate peripheral perfusion Skin:      Warm and dry; no rash Neuro: alert and oriented x 3 Psych: normal mood and affect  Data Reviewed: CT scan 07/27/2008-mild basal atelectasis right upper lobe pulmonary embolism CT scan 11/16/17-mild bronchiectasis, basal reticulation right greater than left.  Borderline right hilar lymphadenopathy.  Nodular liver contour possible cirrhosis High-resolution CT 01/26/18- patchy ground glass attenuation, mild bronchiectasis, septal thickening with no basal gradient.  No honeycombing. Indeterminate for UIP.  Hepatic steatosis, aortic atherosclerosis, left main and left anterior coronary artery disease. High-resolution CT 05/04/2018-mild basal predominant lung fibrosis stable from prior exam. High-resolution CT 10/18/2018-stable interstitial lung disease CTA 09/26/2019-focal filling defect within a subsegmental branch of the right lower lobe pulmonary artery.  Equivocal for PE I have reviewed the images personally.  Barium swallow 05/31/13- mild impairment of esophageal motility, stricture at GE junction with obstruction of barium tablet.  Labs Connective tissue serologies 11/23/17-ANA, ACE, CCP, rheumatoid factor all negative Hypersenitivity panel-negative  PFTs  12/23/17 FVC 2.30 (54%), FEV1 1.97 [66%], F/F 86, TLC 64%, DLCO 42% No  obstruction, moderate restriction with severe diffusion defect  05/18/2018 FVC 2.32 [55%), FEV1 1.91 [65%], F/F 82, DLCO 43% Moderate restriction, severe diffusion defect.  10/20/2018 FVC 2.30 [55%], FEV1 1.96 [6 6%], F/F 85, DLCO 46% Moderate restriction, severe diffusion defect.  10/24/2019 FVC 2.29 [5%], FEV1 2.03 [70%],/89, TLC 3.63 [49%], DLCO 13.57 [54%], Moderate restriction with severe diffusion defect.  Stable compared to 2019  6-minute walk 03/03/18- 338 m 6-minute walk 05/20/18- 288 m 6-minute walk 10/27/18- 363 m  Cardiac RHC 04/20/18 RA: 9 mmHg RV: 29/7 mmHg PA: 29/12 mmHg, mean PAP 19 mmHg PCWP: 9 mmHg LVEDP: 14 mmHg  CO: 4.8 L/min CI: 2.3 L/min/m2  [facility] Bi[facility]AP tit[facility].  Titrated to IPAP 17, EPAP 13.  Severe PLM's noted.  Consider treatment for restless leg syndrome.  Assessment:  Follow-up for bronchiectasis, interstitial lung disease. He could have interstitial lung disease from occupational, recreational exposure to cotton dust, wood dust, possible asbestos.  Other considerations include pneumonitis from ulcerative colitis or from infliximab, meslamine therapy or chronic aspiration from esophageal stricture.  Connective tissue serologies are negative. There is no clear evidence  that he has IPF.   Cardiac work-up as noted above with no evidence of significant coronary artery disease or pulmonary hypertension. Cardio pulmonary exercise test shows dyspnea likely from combination of cardiac, pulmonary factors. For definite diagnosis of ILD and treatment he will need an open lung biopsy. We had an extensive discussion about risks benefits of the biopsy versus empirically treating him with anti-fibrotics Discussed the option of Envisia test through bronchoscope but he would like to defer all biopsies  Reassess with high-resolution CT and pulmonary function tests  Pulmonary embolism Findings are  equivocal with nonspecific filling defects. Would continue anticoagulation given his underlying lung issues and significant dyspnea  OSA Continues on BiPAP Overnight oximetry done earlier this year.  We will check with the DME company to get the results sent to [place]  Plan/Recommendations: - High-res CT, PFTs - Follow-up overnight oximetry results, continue BiPAP  Marshell Garfinkel MD [facility] 07/23/2020, 9:20 AM  CC: Caryl Bis, MD

## 2020-07-23 NOTE — Patient Instructions (Signed)
Continue BiPAP as prescribed We will schedule a high-resolution CT and follow-up pulmonary function test to be done at [facility] Follow-up in clinic in 2 to 3 months for review and plan for next steps

## 2020-07-31 ENCOUNTER — Telehealth: Payer: Self-pay | Admitting: Pulmonary Disease

## 2020-07-31 NOTE — Telephone Encounter (Signed)
Called and spoke with pt in regards to upcoming CT. Pt stated that he no longer has any questions. Nothing further needed.

## 2020-08-06 DIAGNOSIS — E782 Mixed hyperlipidemia: Secondary | ICD-10-CM | POA: Diagnosis not present

## 2020-08-06 DIAGNOSIS — I10 Essential (primary) hypertension: Secondary | ICD-10-CM | POA: Diagnosis not present

## 2020-08-06 DIAGNOSIS — N183 Chronic kidney disease, stage 3 unspecified: Secondary | ICD-10-CM | POA: Diagnosis not present

## 2020-08-06 DIAGNOSIS — K219 Gastro-esophageal reflux disease without esophagitis: Secondary | ICD-10-CM | POA: Diagnosis not present

## 2020-08-06 DIAGNOSIS — E1165 Type 2 diabetes mellitus with hyperglycemia: Secondary | ICD-10-CM | POA: Diagnosis not present

## 2020-08-06 DIAGNOSIS — E871 Hypo-osmolality and hyponatremia: Secondary | ICD-10-CM | POA: Diagnosis not present

## 2020-08-07 DIAGNOSIS — H35033 Hypertensive retinopathy, bilateral: Secondary | ICD-10-CM | POA: Diagnosis not present

## 2020-08-09 DIAGNOSIS — E1122 Type 2 diabetes mellitus with diabetic chronic kidney disease: Secondary | ICD-10-CM | POA: Diagnosis not present

## 2020-08-09 DIAGNOSIS — I2699 Other pulmonary embolism without acute cor pulmonale: Secondary | ICD-10-CM | POA: Diagnosis not present

## 2020-08-09 DIAGNOSIS — I10 Essential (primary) hypertension: Secondary | ICD-10-CM | POA: Diagnosis not present

## 2020-08-09 DIAGNOSIS — Z23 Encounter for immunization: Secondary | ICD-10-CM | POA: Diagnosis not present

## 2020-08-09 DIAGNOSIS — G43909 Migraine, unspecified, not intractable, without status migrainosus: Secondary | ICD-10-CM | POA: Diagnosis not present

## 2020-08-09 DIAGNOSIS — K5100 Ulcerative (chronic) pancolitis without complications: Secondary | ICD-10-CM | POA: Diagnosis not present

## 2020-08-09 DIAGNOSIS — R4582 Worries: Secondary | ICD-10-CM | POA: Diagnosis not present

## 2020-08-12 IMAGING — CT CT CHEST HIGH RESOLUTION W/O CM
2 of 5 series · 14 of 36 positions shown, 17 images · non-contrast
Comparison: High-resolution chest CT 05/04/2018.

CLINICAL DATA: 85-year-old male with history of interstitial lung
disease. Follow-up evaluation.

EXAM:
CT CHEST WITHOUT CONTRAST
TECHNIQUE: Multidetector CT imaging of the chest was performed following the
standard protocol without intravenous contrast. High resolution
imaging of the lungs, as well as inspiratory and expiratory imaging,
was performed.

[Series 3: thorax · axial · 0.70mm/px · z∈[-304,-30]mm · 11 of 153 slices shown, 14 images]
[im 8/153  mediastinal]
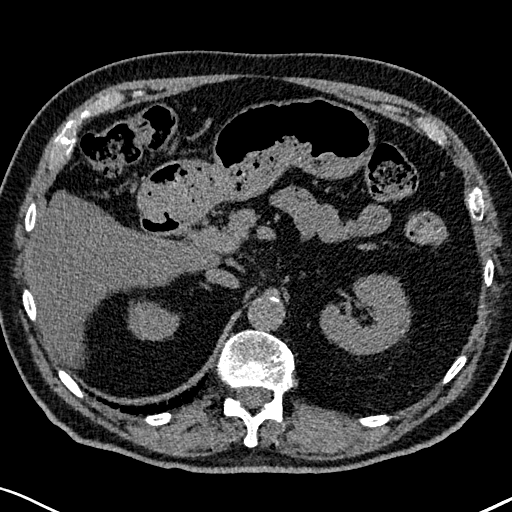
[im 8/153  lung]
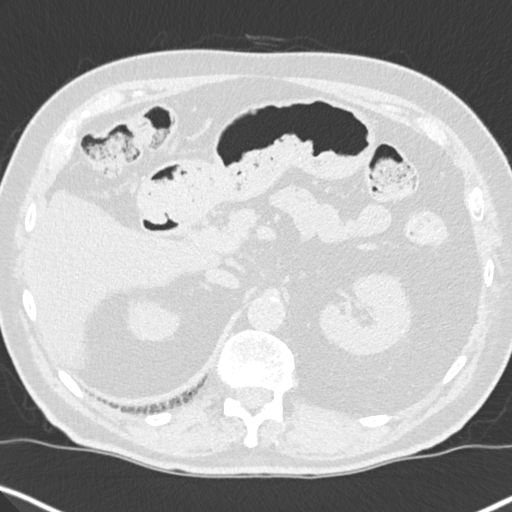
[im 22/153  lung]
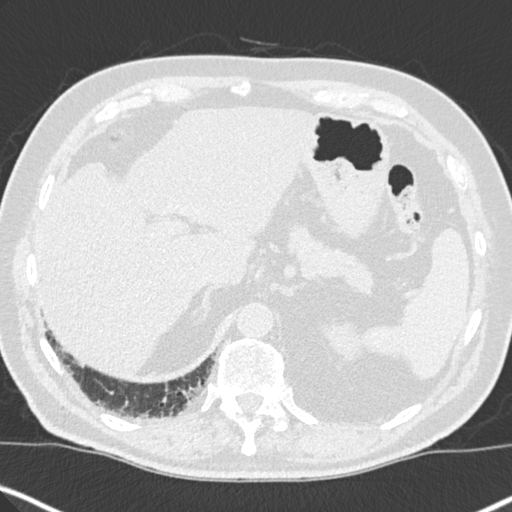
[im 37/153  lung]
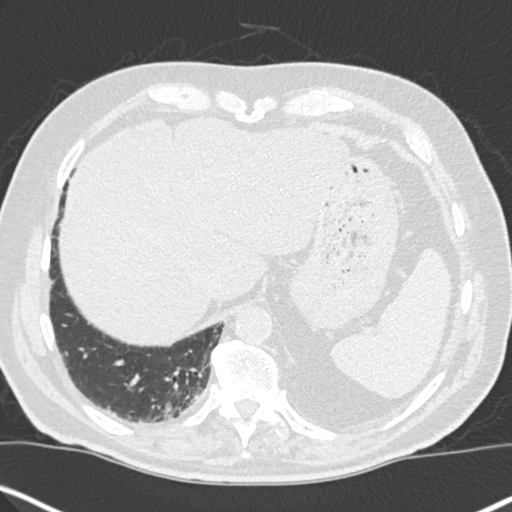
[im 51/153  lung]
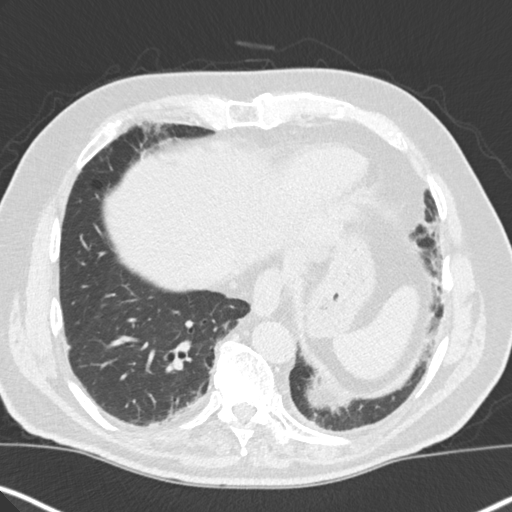
[im 66/153  mediastinal]
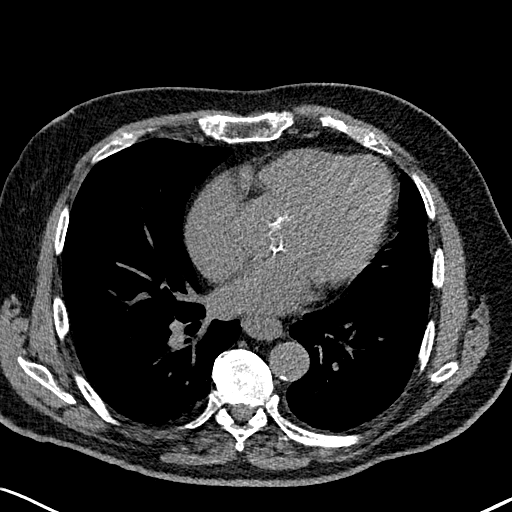
[im 66/153  lung]
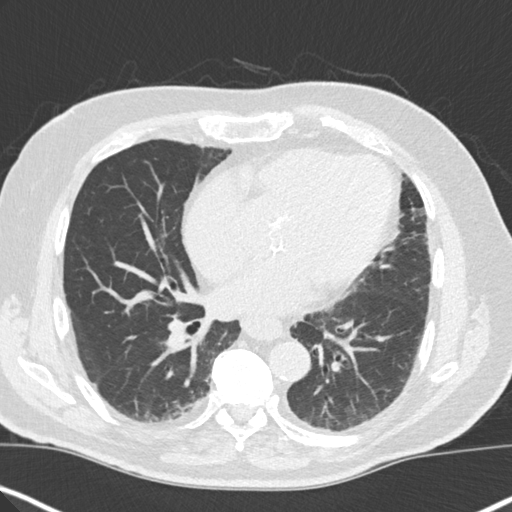
[im 80/153  lung]
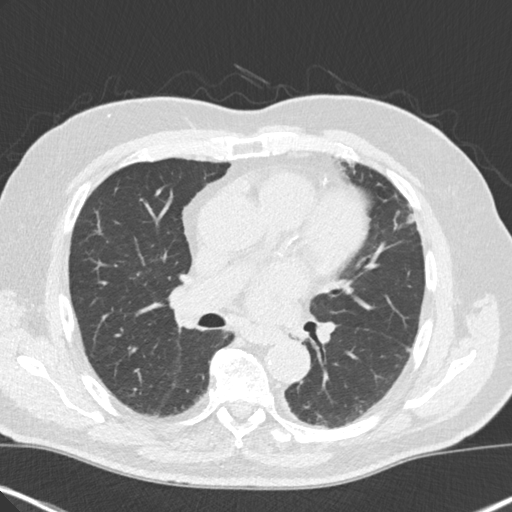
[im 87/153  lung]
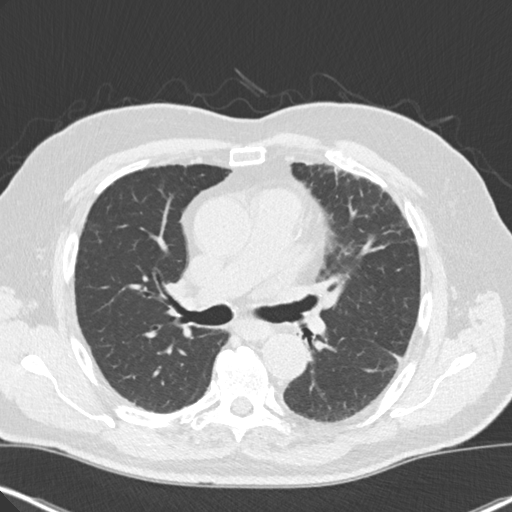
[im 102/153  lung]
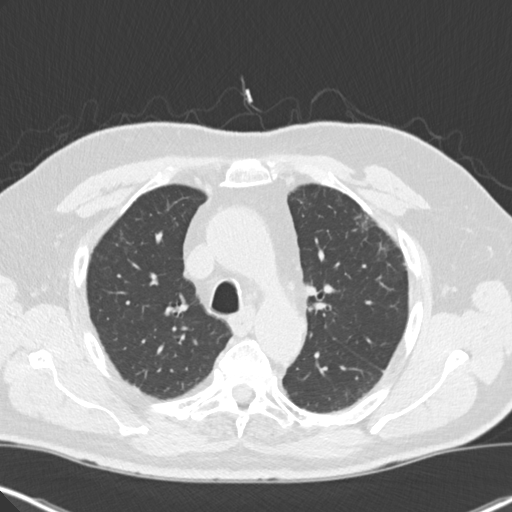
[im 116/153  mediastinal]
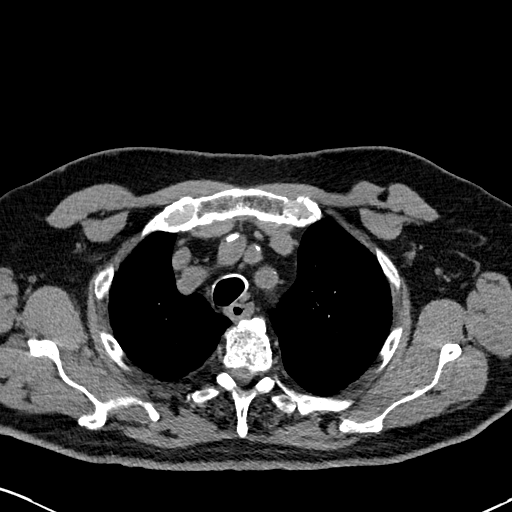
[im 116/153  lung]
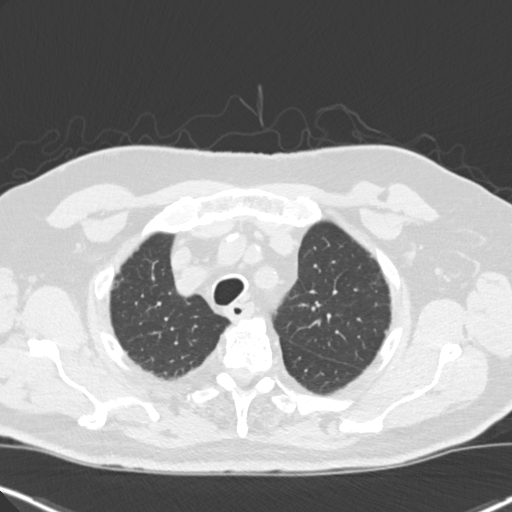
[im 131/153  lung]
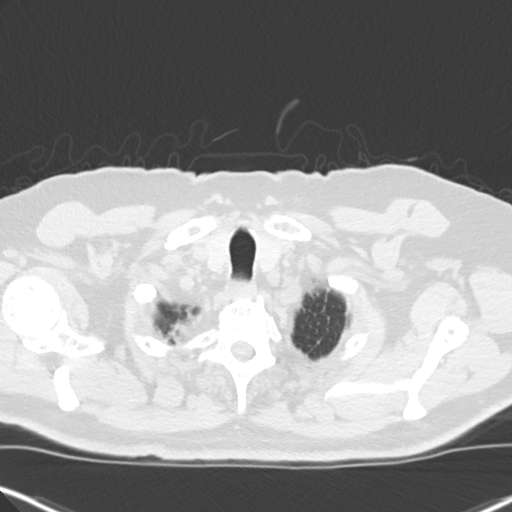
[im 145/153  lung]
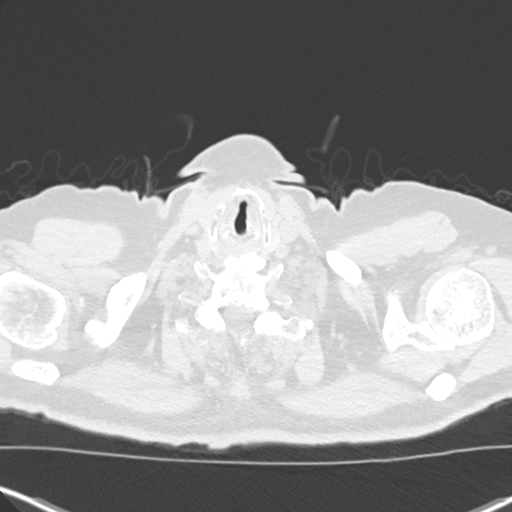

[Series 9: coronal · coronal · 0.61mm/px · 3 of 140 slices shown]
[im 28/140  lung]
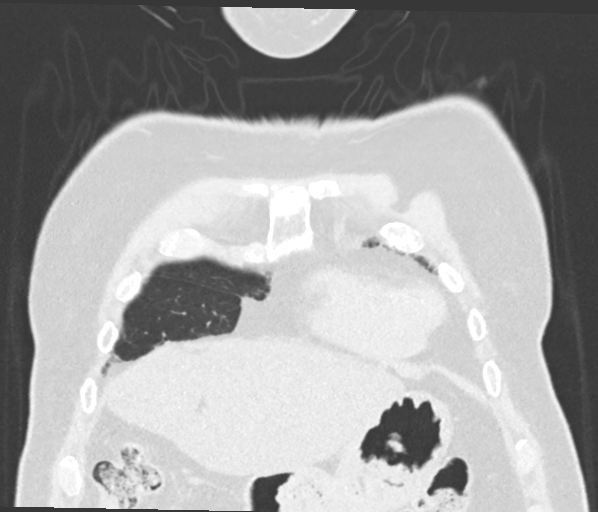
[im 56/140  lung]
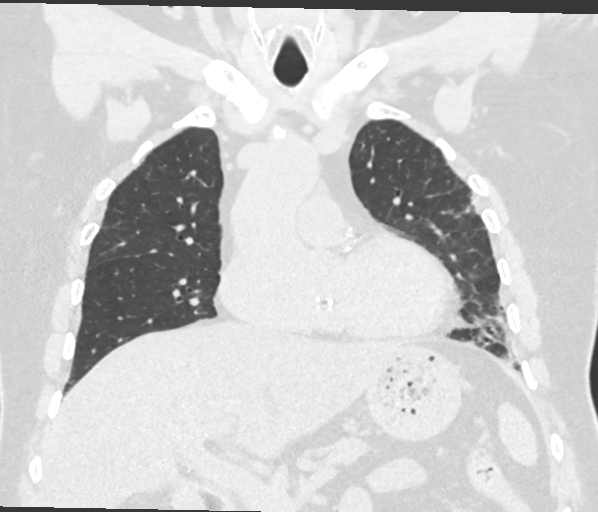
[im 84/140  lung]
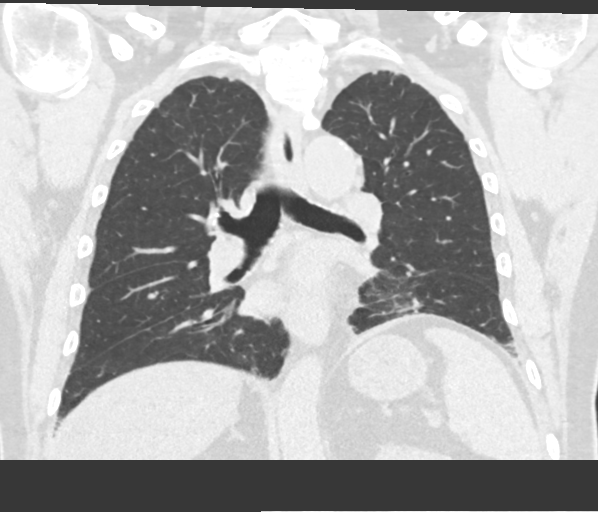

[14 of 36 positions shown; findings below may reference images not displayed]

FINDINGS: Cardiovascular: Heart size is normal. There is no significant
pericardial fluid, thickening or pericardial calcification. There is
aortic atherosclerosis, as well as atherosclerosis of the great
vessels of the mediastinum and the coronary arteries, including
calcified atherosclerotic plaque in the left main, left anterior
descending, left circumflex and right coronary arteries. Severe
calcifications of the aortic valve.

Mediastinum/Nodes: No pathologically enlarged mediastinal or hilar
lymph nodes. Please note that accurate exclusion of hilar adenopathy
is limited on noncontrast CT scans. Esophagus is unremarkable in
appearance. No axillary lymphadenopathy.

Lungs/Pleura: High-resolution images again demonstrate patchy
peripheral predominant areas of septal thickening, minimal
peribronchovascular ground-glass attenuation, scattered areas of
very mild cylindrical bronchiectasis and peripheral
bronchiolectasis. These findings have a definitive craniocaudal
gradient. No honeycombing. Inspiratory and expiratory imaging
demonstrates some mild air trapping indicative of mild small airways
disease. No significant progression of these fibrotic findings when
compared to prior studies dating back to 01/26/2018. No acute
consolidative airspace disease. 2 mm right middle lobe nodule (axial
image 84 of series 7) is stable compared to the prior examination. 4
mm left upper lobe nodule (axial image 62 of series 7), also stable
compared to the prior examination. No larger more suspicious
appearing pulmonary nodules or masses are noted.

Upper Abdomen: Diffuse low attenuation throughout the visualized
hepatic parenchyma, indicative of hepatic steatosis. Liver has a
slightly shrunken appearance and nodular contour, indicative of
underlying cirrhosis. Status post cholecystectomy. Aortic
atherosclerosis.

Musculoskeletal: Old healed fracture of the inferior half of the
sternum. There are no aggressive appearing lytic or blastic lesions
noted in the visualized portions of the skeleton.
IMPRESSION: 1. The appearance of the lungs is compatible with interstitial lung
disease. The spectrum of findings is technically classified as
probable usual interstitial pneumonia (UIP) per current ATS
guidelines. However, given the stability over the prior
examinations, lack of honeycombing, and presence of air trapping,
fibrotic phase nonspecific interstitial pneumonia (NSIP) is the
favored diagnosis. Repeat high-resolution chest CT is suggested in
12 months to assess for temporal changes in the appearance of the
lung parenchyma.
2. Tiny pulmonary nodules measuring 4 mm or less in the lungs
bilaterally, stable compared to prior examinations, strongly favored
to be benign.
3. Aortic atherosclerosis, in addition to left main and 3 vessel
coronary artery disease.
4. There are calcifications of the aortic valve. Echocardiographic
correlation for evaluation of potential valvular dysfunction may be
warranted if clinically indicated.
5. Hepatic steatosis with evidence of hepatic cirrhosis.

Aortic Atherosclerosis (675YC-2O5.5).

## 2020-08-13 DIAGNOSIS — M9902 Segmental and somatic dysfunction of thoracic region: Secondary | ICD-10-CM | POA: Diagnosis not present

## 2020-08-13 DIAGNOSIS — M9903 Segmental and somatic dysfunction of lumbar region: Secondary | ICD-10-CM | POA: Diagnosis not present

## 2020-08-13 DIAGNOSIS — S233XXA Sprain of ligaments of thoracic spine, initial encounter: Secondary | ICD-10-CM | POA: Diagnosis not present

## 2020-08-13 DIAGNOSIS — M546 Pain in thoracic spine: Secondary | ICD-10-CM | POA: Diagnosis not present

## 2020-08-13 DIAGNOSIS — S2341XA Sprain of ribs, initial encounter: Secondary | ICD-10-CM | POA: Diagnosis not present

## 2020-08-16 DIAGNOSIS — S233XXA Sprain of ligaments of thoracic spine, initial encounter: Secondary | ICD-10-CM | POA: Diagnosis not present

## 2020-08-16 DIAGNOSIS — M9903 Segmental and somatic dysfunction of lumbar region: Secondary | ICD-10-CM | POA: Diagnosis not present

## 2020-08-16 DIAGNOSIS — S2341XA Sprain of ribs, initial encounter: Secondary | ICD-10-CM | POA: Diagnosis not present

## 2020-08-16 DIAGNOSIS — M9902 Segmental and somatic dysfunction of thoracic region: Secondary | ICD-10-CM | POA: Diagnosis not present

## 2020-08-16 DIAGNOSIS — M546 Pain in thoracic spine: Secondary | ICD-10-CM | POA: Diagnosis not present

## 2020-08-20 DIAGNOSIS — M546 Pain in thoracic spine: Secondary | ICD-10-CM | POA: Diagnosis not present

## 2020-08-20 DIAGNOSIS — M9902 Segmental and somatic dysfunction of thoracic region: Secondary | ICD-10-CM | POA: Diagnosis not present

## 2020-08-20 DIAGNOSIS — M9903 Segmental and somatic dysfunction of lumbar region: Secondary | ICD-10-CM | POA: Diagnosis not present

## 2020-08-20 DIAGNOSIS — S2341XA Sprain of ribs, initial encounter: Secondary | ICD-10-CM | POA: Diagnosis not present

## 2020-08-20 DIAGNOSIS — S233XXA Sprain of ligaments of thoracic spine, initial encounter: Secondary | ICD-10-CM | POA: Diagnosis not present

## 2020-08-21 ENCOUNTER — Ambulatory Visit (HOSPITAL_COMMUNITY)
Admission: RE | Admit: 2020-08-21 | Discharge: 2020-08-21 | Disposition: A | Discharge status: Home / Self Care | Payer: Medicare Other | Source: Ambulatory Visit | Attending: Pulmonary Disease | Admitting: Pulmonary Disease

## 2020-08-21 ENCOUNTER — Other Ambulatory Visit: Payer: Self-pay

## 2020-08-21 DIAGNOSIS — J849 Interstitial pulmonary disease, unspecified: Secondary | ICD-10-CM | POA: Insufficient documentation

## 2020-08-21 DIAGNOSIS — J479 Bronchiectasis, uncomplicated: Secondary | ICD-10-CM | POA: Diagnosis not present

## 2020-08-23 DIAGNOSIS — S2341XA Sprain of ribs, initial encounter: Secondary | ICD-10-CM | POA: Diagnosis not present

## 2020-08-23 DIAGNOSIS — S233XXA Sprain of ligaments of thoracic spine, initial encounter: Secondary | ICD-10-CM | POA: Diagnosis not present

## 2020-08-23 DIAGNOSIS — M9903 Segmental and somatic dysfunction of lumbar region: Secondary | ICD-10-CM | POA: Diagnosis not present

## 2020-08-23 DIAGNOSIS — M546 Pain in thoracic spine: Secondary | ICD-10-CM | POA: Diagnosis not present

## 2020-08-23 DIAGNOSIS — M9902 Segmental and somatic dysfunction of thoracic region: Secondary | ICD-10-CM | POA: Diagnosis not present

## 2020-08-27 DIAGNOSIS — S2341XA Sprain of ribs, initial encounter: Secondary | ICD-10-CM | POA: Diagnosis not present

## 2020-08-27 DIAGNOSIS — M9903 Segmental and somatic dysfunction of lumbar region: Secondary | ICD-10-CM | POA: Diagnosis not present

## 2020-08-27 DIAGNOSIS — M9902 Segmental and somatic dysfunction of thoracic region: Secondary | ICD-10-CM | POA: Diagnosis not present

## 2020-08-27 DIAGNOSIS — M546 Pain in thoracic spine: Secondary | ICD-10-CM | POA: Diagnosis not present

## 2020-08-27 DIAGNOSIS — S233XXA Sprain of ligaments of thoracic spine, initial encounter: Secondary | ICD-10-CM | POA: Diagnosis not present

## 2020-08-29 DIAGNOSIS — L110 Acquired keratosis follicularis: Secondary | ICD-10-CM | POA: Diagnosis not present

## 2020-08-29 DIAGNOSIS — E1140 Type 2 diabetes mellitus with diabetic neuropathy, unspecified: Secondary | ICD-10-CM | POA: Diagnosis not present

## 2020-08-29 DIAGNOSIS — B351 Tinea unguium: Secondary | ICD-10-CM | POA: Diagnosis not present

## 2020-08-31 DIAGNOSIS — S233XXA Sprain of ligaments of thoracic spine, initial encounter: Secondary | ICD-10-CM | POA: Diagnosis not present

## 2020-08-31 DIAGNOSIS — S2341XA Sprain of ribs, initial encounter: Secondary | ICD-10-CM | POA: Diagnosis not present

## 2020-08-31 DIAGNOSIS — M9902 Segmental and somatic dysfunction of thoracic region: Secondary | ICD-10-CM | POA: Diagnosis not present

## 2020-08-31 DIAGNOSIS — M546 Pain in thoracic spine: Secondary | ICD-10-CM | POA: Diagnosis not present

## 2020-08-31 DIAGNOSIS — M9903 Segmental and somatic dysfunction of lumbar region: Secondary | ICD-10-CM | POA: Diagnosis not present

## 2020-09-04 DIAGNOSIS — M9902 Segmental and somatic dysfunction of thoracic region: Secondary | ICD-10-CM | POA: Diagnosis not present

## 2020-09-04 DIAGNOSIS — S233XXA Sprain of ligaments of thoracic spine, initial encounter: Secondary | ICD-10-CM | POA: Diagnosis not present

## 2020-09-04 DIAGNOSIS — S2341XA Sprain of ribs, initial encounter: Secondary | ICD-10-CM | POA: Diagnosis not present

## 2020-09-04 DIAGNOSIS — M546 Pain in thoracic spine: Secondary | ICD-10-CM | POA: Diagnosis not present

## 2020-09-04 DIAGNOSIS — M9903 Segmental and somatic dysfunction of lumbar region: Secondary | ICD-10-CM | POA: Diagnosis not present

## 2020-09-06 ENCOUNTER — Other Ambulatory Visit (INDEPENDENT_AMBULATORY_CARE_PROVIDER_SITE_OTHER): Payer: Self-pay | Admitting: Internal Medicine

## 2020-09-06 DIAGNOSIS — S2341XA Sprain of ribs, initial encounter: Secondary | ICD-10-CM | POA: Diagnosis not present

## 2020-09-06 DIAGNOSIS — M546 Pain in thoracic spine: Secondary | ICD-10-CM | POA: Diagnosis not present

## 2020-09-06 DIAGNOSIS — M9903 Segmental and somatic dysfunction of lumbar region: Secondary | ICD-10-CM | POA: Diagnosis not present

## 2020-09-06 DIAGNOSIS — S233XXA Sprain of ligaments of thoracic spine, initial encounter: Secondary | ICD-10-CM | POA: Diagnosis not present

## 2020-09-06 DIAGNOSIS — M9902 Segmental and somatic dysfunction of thoracic region: Secondary | ICD-10-CM | POA: Diagnosis not present

## 2020-09-06 NOTE — Telephone Encounter (Signed)
Last seen 05/01/2020 for Ulcerative pancolitis with out complication , and Gerd by Dr. Laural Golden

## 2020-09-08 DIAGNOSIS — N183 Chronic kidney disease, stage 3 unspecified: Secondary | ICD-10-CM | POA: Diagnosis not present

## 2020-09-08 DIAGNOSIS — E7849 Other hyperlipidemia: Secondary | ICD-10-CM | POA: Diagnosis not present

## 2020-09-08 DIAGNOSIS — E1122 Type 2 diabetes mellitus with diabetic chronic kidney disease: Secondary | ICD-10-CM | POA: Diagnosis not present

## 2020-09-08 DIAGNOSIS — I129 Hypertensive chronic kidney disease with stage 1 through stage 4 chronic kidney disease, or unspecified chronic kidney disease: Secondary | ICD-10-CM | POA: Diagnosis not present

## 2020-09-11 DIAGNOSIS — S2341XA Sprain of ribs, initial encounter: Secondary | ICD-10-CM | POA: Diagnosis not present

## 2020-09-11 DIAGNOSIS — M9903 Segmental and somatic dysfunction of lumbar region: Secondary | ICD-10-CM | POA: Diagnosis not present

## 2020-09-11 DIAGNOSIS — M546 Pain in thoracic spine: Secondary | ICD-10-CM | POA: Diagnosis not present

## 2020-09-11 DIAGNOSIS — S233XXA Sprain of ligaments of thoracic spine, initial encounter: Secondary | ICD-10-CM | POA: Diagnosis not present

## 2020-09-11 DIAGNOSIS — M9902 Segmental and somatic dysfunction of thoracic region: Secondary | ICD-10-CM | POA: Diagnosis not present

## 2020-09-13 DIAGNOSIS — M546 Pain in thoracic spine: Secondary | ICD-10-CM | POA: Diagnosis not present

## 2020-09-13 DIAGNOSIS — S2341XA Sprain of ribs, initial encounter: Secondary | ICD-10-CM | POA: Diagnosis not present

## 2020-09-13 DIAGNOSIS — M9902 Segmental and somatic dysfunction of thoracic region: Secondary | ICD-10-CM | POA: Diagnosis not present

## 2020-09-13 DIAGNOSIS — M9903 Segmental and somatic dysfunction of lumbar region: Secondary | ICD-10-CM | POA: Diagnosis not present

## 2020-09-13 DIAGNOSIS — S233XXA Sprain of ligaments of thoracic spine, initial encounter: Secondary | ICD-10-CM | POA: Diagnosis not present

## 2020-09-20 DIAGNOSIS — S233XXA Sprain of ligaments of thoracic spine, initial encounter: Secondary | ICD-10-CM | POA: Diagnosis not present

## 2020-09-20 DIAGNOSIS — M9902 Segmental and somatic dysfunction of thoracic region: Secondary | ICD-10-CM | POA: Diagnosis not present

## 2020-09-20 DIAGNOSIS — S2341XA Sprain of ribs, initial encounter: Secondary | ICD-10-CM | POA: Diagnosis not present

## 2020-09-20 DIAGNOSIS — M9903 Segmental and somatic dysfunction of lumbar region: Secondary | ICD-10-CM | POA: Diagnosis not present

## 2020-09-20 DIAGNOSIS — M546 Pain in thoracic spine: Secondary | ICD-10-CM | POA: Diagnosis not present

## 2020-09-25 DIAGNOSIS — Z23 Encounter for immunization: Secondary | ICD-10-CM | POA: Diagnosis not present

## 2020-09-26 DIAGNOSIS — M9902 Segmental and somatic dysfunction of thoracic region: Secondary | ICD-10-CM | POA: Diagnosis not present

## 2020-09-26 DIAGNOSIS — S233XXA Sprain of ligaments of thoracic spine, initial encounter: Secondary | ICD-10-CM | POA: Diagnosis not present

## 2020-09-26 DIAGNOSIS — M9903 Segmental and somatic dysfunction of lumbar region: Secondary | ICD-10-CM | POA: Diagnosis not present

## 2020-09-26 DIAGNOSIS — S2341XA Sprain of ribs, initial encounter: Secondary | ICD-10-CM | POA: Diagnosis not present

## 2020-09-26 DIAGNOSIS — M546 Pain in thoracic spine: Secondary | ICD-10-CM | POA: Diagnosis not present

## 2020-10-01 ENCOUNTER — Ambulatory Visit: Payer: Medicare Other | Admitting: Pulmonary Disease

## 2020-10-01 ENCOUNTER — Other Ambulatory Visit: Payer: Self-pay

## 2020-10-01 ENCOUNTER — Ambulatory Visit (INDEPENDENT_AMBULATORY_CARE_PROVIDER_SITE_OTHER): Payer: Medicare Other | Admitting: Pulmonary Disease

## 2020-10-01 ENCOUNTER — Encounter: Payer: Self-pay | Admitting: Pulmonary Disease

## 2020-10-01 VITALS — BP 132/60 | HR 82 | Temp 98.1°F | Ht 71.5 in | Wt 209.0 lb

## 2020-10-01 DIAGNOSIS — R0609 Other forms of dyspnea: Secondary | ICD-10-CM

## 2020-10-01 DIAGNOSIS — J849 Interstitial pulmonary disease, unspecified: Secondary | ICD-10-CM

## 2020-10-01 DIAGNOSIS — G4733 Obstructive sleep apnea (adult) (pediatric): Secondary | ICD-10-CM

## 2020-10-01 DIAGNOSIS — R0600 Dyspnea, unspecified: Secondary | ICD-10-CM | POA: Diagnosis not present

## 2020-10-01 DIAGNOSIS — Z0000 Encounter for general adult medical examination without abnormal findings: Secondary | ICD-10-CM | POA: Insufficient documentation

## 2020-10-01 DIAGNOSIS — Z86711 Personal history of pulmonary embolism: Secondary | ICD-10-CM

## 2020-10-01 LAB — PULMONARY FUNCTION TEST
DL/VA % pred: 111 %
DL/VA: 4.23 ml/min/mmHg/L
DLCO cor % pred: 63 %
DLCO cor: 15.77 ml/min/mmHg
DLCO unc % pred: 61 %
DLCO unc: 15.21 ml/min/mmHg
FEF 25-75 Post: 3.07 L/sec
FEF 25-75 Pre: 2.54 L/sec
FEF2575-%Change-Post: 20 %
FEF2575-%Pred-Post: 164 %
FEF2575-%Pred-Pre: 136 %
FEV1-%Change-Post: 8 %
FEV1-%Pred-Post: 69 %
FEV1-%Pred-Pre: 64 %
FEV1-Post: 1.99 L
FEV1-Pre: 1.83 L
FEV1FVC-%Change-Post: 3 %
FEV1FVC-%Pred-Pre: 119 %
FEV6-%Change-Post: 5 %
FEV6-%Pred-Post: 60 %
FEV6-%Pred-Pre: 57 %
FEV6-Post: 2.28 L
FEV6-Pre: 2.16 L
FEV6FVC-%Pred-Post: 107 %
FEV6FVC-%Pred-Pre: 107 %
FVC-%Change-Post: 5 %
FVC-%Pred-Post: 56 %
FVC-%Pred-Pre: 53 %
FVC-Post: 2.28 L
FVC-Pre: 2.16 L
Post FEV1/FVC ratio: 87 %
Post FEV6/FVC ratio: 100 %
Pre FEV1/FVC ratio: 84 %
Pre FEV6/FVC Ratio: 100 %
RV % pred: 83 %
RV: 2.39 L
TLC % pred: 64 %
TLC: 4.75 L

## 2020-10-01 NOTE — Progress Notes (Signed)
Full PFT performed today. °

## 2020-10-01 NOTE — Assessment & Plan Note (Signed)
Plan:   continue Eliquis

## 2020-10-01 NOTE — Assessment & Plan Note (Signed)
Plan: Continue BiPAP Contacted [employer] Representative Melissa, she relocated the April/2021 overnight oximetry results, she will have this faxed to our office

## 2020-10-01 NOTE — Assessment & Plan Note (Signed)
Stable ILD Stable pulmonary function testing Stable high-resolution CT imaging Also component of physical deconditioning

## 2020-10-01 NOTE — Assessment & Plan Note (Signed)
Up-to-date with COVID-19 vaccinations Up-to-date with flu vaccine

## 2020-10-01 NOTE — Assessment & Plan Note (Signed)
Known ILD Probable NSIP Stable high-resolution CT chest, reviewed with patient today Stable pulmonary function testing, reviewed with patient today Also reviewed December/2020 pulmonary function testing Walk today in office stable without any oxygen desaturations  Plan: Spirometry with DLCO in 6 months Follow-up with Dr. Vaughan Browner in 3 months Work on increasing overall physical mobility Work on chair exercises as well as standing exercises Continue to work on increasing your cardiovascular exercise

## 2020-10-01 NOTE — Progress Notes (Signed)
@Patient  ID: Manuel Atkins, male    DOB: 25-Aug-1933, 86 y.o.   MRN: [id number]  Chief Complaint  Patient presents with  . Follow-up    Had PFT today-sob same, cough-gray    Referring provider: Caryl Bis, MD  HPI:  86 year old male former smoker followed in our office for interstitial lung disease, obstructive sleep apnea and dyspnea on exertion  PMH: Hyperlipidemia, hypertension, type 2 diabetes, ulcerative colitis, GERD, history of VTE event: PE Smoker/ Smoking History: Former smoker Maintenance: None Pt of: Dr. Vaughan Browner  10/01/2020  - Visit   86 year old male former smoker followed in our office for interstitial lung disease.  He is followed by Dr. Vaughan Browner.  Patient was last seen in our office in September/2021.  It was felt at that time the patient had ongoing dyspnea if not potentially worsened dyspnea.  Plan of care was for the patient obtain pulmonary function testing, high-resolution CT chest, overnight oximetry results as well as to continue BiPAP.  Patient completed high-resolution CT chest on 08/22/2020 those results are listed below:  08/22/2020-CT chest high-res-appearance of lungs is compatible with interstitial lung disease with a stable spectrum of findings which is technically categorized as probable UIP per the current ATS guidelines, however there is once again strongly favored to reflect fibrotic phase NSIP, aortic arthrosclerosis, calcifications on aortic valve and mitral annulus, morphologic changes in the liver indicative of cirrhosis with mild hepatic stenosis  Patient completing pulmonary function testing with our office today: Those results are listed below  10/01/2020-pulmonary function test-FVC 2.16 (53% predicted), TLC 4.75 (64% predicted), postbronchodilator ratio 87, postbronchodilator FEV1 1.99 (69% addicted), no bronchodilator response, DLCO 15.21 (61% predicted)  In comparison to December/2020 pulmonary function testing this shows stable lung  functioning if not improvement.  Patient continues to utilize his BiPAP.  He has excellent compliance.  See compliance report listed below:  09/01/2020-09/30/2020-28 of the last 30 days use, 28 of those days greater than 4 hours, average usage 8 hours and 29 minutes, IPAP setting 16, EPAP setting 12, AHI 3.9  We are still awaiting results from April/2021 overnight oximetry results.  We will contact [employer].  Patient reports that he is able to walk and exercise some around 100 to 200 feet a day.  He is quite dyspneic when he does his physical exertion.  He was short of breath walking into our office just from parking the car.  He walks with a cane.  Walk in office today was stable with no oxygen desaturations on room air.  Patient was only able to complete 1 lap.   Questionaires / Pulmonary Flowsheets:   ACT:  No flowsheet data found.  MMRC: mMRC Dyspnea Scale mMRC Score  10/01/2020 3    Epworth:  No flowsheet data found.  Tests:   CT scan 07/27/2008-mild basal atelectasis right upper lobe pulmonary embolism CT scan 11/16/17-mild bronchiectasis, basal reticulation right greater than left.  Borderline right hilar lymphadenopathy.  Nodular liver contour possible cirrhosis High-resolution CT 01/26/18- patchy ground glass attenuation, mild bronchiectasis, septal thickening with no basal gradient.  No honeycombing. Indeterminate for UIP.  Hepatic steatosis, aortic atherosclerosis, left main and left anterior coronary artery disease. High-resolution CT 05/04/2018-mild basal predominant lung fibrosis stable from prior exam. High-resolution CT 10/18/2018-stable interstitial lung disease CTA 09/26/2019-focal filling defect within a subsegmental branch of the right lower lobe pulmonary artery.  Equivocal for PE  Barium swallow 05/31/13- mild impairment of esophageal motility, stricture at GE junction with obstruction of barium tablet.  Labs Connective tissue serologies 11/23/17-ANA, ACE, CCP,  rheumatoid factor all negative Hypersenitivity panel-negative  PFTs  12/23/17 FVC 2.30 (54%), FEV1 1.97 [66%], F/F 86, TLC 64%, DLCO 42% No obstruction, moderate restriction with severe diffusion defect  05/18/2018 FVC 2.32 [55%), FEV1 1.91 [65%], F/F 82, DLCO 43% Moderate restriction, severe diffusion defect.  10/20/2018 FVC 2.30 [55%], FEV1 1.96 [6 6%], F/F 85, DLCO 46% Moderate restriction, severe diffusion defect.  10/24/2019 FVC 2.29 [5%], FEV1 2.03 [70%],/89, TLC 3.63 [49%], DLCO 13.57 [54%], Moderate restriction with severe diffusion defect.  Stable compared to 2019  6-minute walk 03/03/18- 338 m 6-minute walk 05/20/18- 288 m 6-minute walk 10/27/18- 363 m  Cardiac RHC 04/20/18 RA: 9 mmHg RV: 29/7 mmHg PA: 29/12 mmHg, mean PAP 19 mmHg PCWP: 9 mmHg LVEDP: 14 mmHg  CO: 4.8 L/min CI: 2.3 L/min/m2  Sleep Received copy of sleep study from [facility], 08/01/2011 BiPAP titration.  Titrated to IPAP 17, EPAP 13.  Severe PLM's noted.  Consider treatment for restless leg syndrome.   FENO:  No results found for: NITRICOXIDE  PFT: PFT Results Latest Ref Rng & Units 10/01/2020 10/24/2019 10/20/2018 05/20/2018 12/23/2017  FVC-Pre L 2.16 2.18 2.30 2.32 2.44  FVC-Predicted Pre % 53 53 55 55 58  FVC-Post L 2.28 2.29 - - 2.30  FVC-Predicted Post % 56 55 - - 54  Pre FEV1/FVC % % 84 86 85 82 82  Post FEV1/FCV % % 87 89 - - 86  FEV1-Pre L 1.83 1.87 1.96 1.91 2.01  FEV1-Predicted Pre % 64 64 66 65 67  FEV1-Post L 1.99 2.03 - - 1.97  DLCO uncorrected ml/min/mmHg 15.21 13.57 15.85 14.98 14.81  DLCO UNC% % 61 54 46 43 43  DLCO corrected ml/min/mmHg 15.77 14.41 - - 14.77  DLCO COR %Predicted % 63 57 - - 42  DLVA Predicted % 111 98 91 79 75  TLC L 4.75 3.63 - - 4.77  TLC % Predicted % 64 49 - - 64  RV % Predicted % 83 47 - - 75    WALK:  SIX MIN WALK 10/01/2020 10/21/2019 10/27/2018 05/18/2018 04/27/2018 01/15/2018  Medications - Eliquis 43m, glipizide 2.560m losartan 10055m metformin 500m57m 730 none Norvasc 1.25mg56m, Asa 81mg,8mlda 1.2g, Metformin 500mg, 50monix 40mg- a49maken approx 8:30 am.  - -  Supplimental Oxygen during Test? (L/min) No No No No No No  Laps - 8 10 6  - -  Partial Lap (in Meters) - 0 23 0 - -  Baseline BP (sitting) - 124/70 144/80 124/66 - -  Baseline Heartrate - 88 84 87 - -  Baseline Dyspnea (Borg Scale) - 3 3 2  - -  Baseline Fatigue (Borg Scale) - 2 4 0.5 - -  Baseline SPO2 - 98 95 99 - -  BP (sitting) - 140/80 160/92 132/70 - -  Heartrate - 105 89 97 - -  Dyspnea (Borg Scale) - 3 4 3  - -  Fatigue (Borg Scale) - 3 4 2  - -  SPO2 - 95 94 100 - -  BP (sitting) - 132/72 160/90 126/68 - -  Heartrate - 95 88 77 - -  SPO2 - 98 98 98 - -  Stopped or Paused before Six Minutes - No No No - -  Interpretation - - Dizziness Hip pain - -  Distance Completed - 272 363 288 - -  Tech Comments: Completed 1 lap at a slow pace with a cane. No complaints. Pt walked at a  slow pace with a cane completing the entire 6 minutes having no complaints. - pt performed test with the use of a cane.  tolerated walk well.   pt walked a moderate pace with a cane, tolerated walk well -    Imaging: No results found.  Lab Results:  CBC    Component Value Date/Time   WBC 7.5 09/27/2019 0504   RBC 4.00 (L) 09/27/2019 0504   HGB 12.7 (L) 09/27/2019 0504   HCT 38.7 (L) 09/27/2019 0504   PLT 167 09/27/2019 0504   MCV 96.8 09/27/2019 0504   MCH 31.8 09/27/2019 0504   MCHC 32.8 09/27/2019 0504   RDW 13.2 09/27/2019 0504   LYMPHSABS 1,246 09/26/2019 1646   MONOABS 0.8 06/23/2019 1431   EOSABS 131 09/26/2019 1646   BASOSABS 57 09/26/2019 1646    BMET    Component Value Date/Time   NA 137 09/27/2019 0504   K 4.2 09/27/2019 0504   CL 103 09/27/2019 0504   CO2 24 09/27/2019 0504   GLUCOSE 197 (H) 09/27/2019 0504   BUN 25 (H) 09/27/2019 0504   CREATININE 1.66 (H) 09/27/2019 0504   CREATININE 1.65 (H) 09/26/2019 1646   CALCIUM 8.6 (L) 09/27/2019 0504    CALCIUM 9.6 11/15/2012 1425   GFRNONAA 37 (L) 09/27/2019 0504   GFRAA 43 (L) 09/27/2019 0504    BNP    Component Value Date/Time   BNP 23 09/26/2019 1646    ProBNP    Component Value Date/Time   PROBNP 30.0 06/23/2019 1431    Specialty Problems      Pulmonary Problems   Abnormal breath sounds   Dyspnea   ILD (interstitial lung disease) (HCC)   OSA (obstructive sleep apnea)      Allergies  Allergen Reactions  . Mercaptopurine Other (See Comments)    High Fever, Chills, Fatigue    Immunization History  Administered Date(s) Administered  . Fluad Quad(high Dose 65+) 08/31/2020  . Influenza, High Dose Seasonal PF 07/19/2018, 08/10/2019  . Influenza-Unspecified 10/09/2017  . Moderna SARS-COVID-2 Vaccination 12/03/2019, 01/02/2020, 09/25/2020  . PPD Test 11/01/2012  . Pneumococcal Polysaccharide-23 11/11/2007  . Tdap 11/11/2007    Past Medical History:  Diagnosis Date  . 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 Dyspnea   . Essential hypertension, benign   . GERD (gastroesophageal reflux disease)   . Headache(784.0)   . History of colon polyps   . History of transfusion of whole blood   . Interstitial lung disease ([place]) 12/01/2017  . Mixed hyperlipidemia   . Obstructive sleep apnea (adult) (pediatric)    uses bipap @ HS  . Other malaise and fatigue   . Thrombocytopenia ([place]) 11/10/2012  . Type II or unspecified type diabetes mellitus without mention of complication, uncontrolled   . 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HCC)     Tobacco History: Social History   Tobacco Use  Smoking Status Former Smoker  . Years: 20.00  . Types: Pipe, Cigars  . Quit date: 11/10/1968  . Years since quitting: 51.9  Smokeless Tobacco Never Used   Counseling given: Not Answered   Continue to not smoke  Outpatient Encounter Medications as of 10/01/2020  Medication Sig  . acetaminophen (TYLENOL) 500 MG tablet Take 1,000 mg by mouth every 6 (six) hours as needed for moderate pain  or headache.  . ALPRAZolam (XANAX) 0.25 MG tablet Take 0.5 tablets (0.125 mg total) by mouth 3 (three) times daily as needed for anxiety. for anxiety  . diphenhydrAMINE (BENADRYL) 50 MG capsule Take  50 mg by mouth. 1 daily.  Marland Kitchen ELIQUIS 5 MG TABS tablet TAKE ONE TABLET BY MOUTH TWICE DAILY.  Marland Kitchen glipiZIDE (GLUCOTROL XL) 2.5 MG 24 hr tablet Take 2.5 mg by mouth daily with breakfast.   . imipramine (TOFRANIL) 25 MG tablet Take 75 mg by mouth at bedtime  . latanoprost (XALATAN) 0.005 % ophthalmic solution Place 1 drop into both eyes at bedtime.  Marland Kitchen loratadine (CLARITIN) 10 MG tablet Take 10 mg by mouth every 8 (eight) weeks.  . metFORMIN (GLUCOPHAGE-XR) 500 MG 24 hr tablet Take 500-1,000 mg by mouth See admin instructions. Patient takes 500 mg in the morning and 1000 mg at night.  . pantoprazole (PROTONIX) 40 MG tablet TAKE ONE TABLET BY MOUTH EVERY *OTHER* DAY.  . Probiotic Product (PROBIOTIC DAILY PO) Take 1 capsule by mouth daily.   . rosuvastatin (CRESTOR) 10 MG tablet Take 10 mg by mouth every evening.   . tamsulosin (FLOMAX) 0.4 MG CAPS capsule Take 0.4 mg by mouth at bedtime.   No facility-administered encounter medications on file as of 10/01/2020.     Review of Systems  Review of Systems  Constitutional: Positive for fatigue. Negative for activity change, chills, fever and unexpected weight change.  HENT: Positive for congestion. Negative for postnasal drip, rhinorrhea, sinus pressure, sinus pain and sore throat.   Eyes: Negative.   Respiratory: Positive for cough and shortness of breath. Negative for wheezing.   Cardiovascular: Negative for chest pain and palpitations.  Gastrointestinal: Negative for constipation, diarrhea, nausea and vomiting.  Endocrine: Negative.   Genitourinary: Negative.   Musculoskeletal: Negative.   Skin: Negative.   Neurological: Negative for dizziness and headaches.  Psychiatric/Behavioral: Negative.  Negative for dysphoric mood. The patient is not  nervous/anxious.   All other systems reviewed and are negative.    Physical Exam  BP 132/60 (BP Location: Left Arm, Cuff Size: Normal)   Pulse 82   Temp 98.1 F (36.7 C) (Temporal)   Ht 5' 11.5" (1.816 m)   Wt 209 lb (94.8 kg)   SpO2 95%   BMI 28.74 kg/m   Wt Readings from Last 5 Encounters:  10/01/20 209 lb (94.8 kg)  07/23/20 207 lb (93.9 kg)  05/01/20 207 lb 12.8 oz (94.3 kg)  02/03/20 208 lb 12.8 oz (94.7 kg)  10/24/19 206 lb (93.4 kg)    BMI Readings from Last 5 Encounters:  10/01/20 28.74 kg/m  07/23/20 28.47 kg/m  05/01/20 28.98 kg/m  02/03/20 28.72 kg/m  10/24/19 28.33 kg/m     Physical Exam Vitals and nursing note reviewed.  Constitutional:      General: He is not in acute distress.    Appearance: Normal appearance. He is normal weight.  HENT:     Head: Normocephalic and atraumatic.     Right Ear: Hearing and external ear normal.     Left Ear: Hearing and external ear normal.     Nose: Nose normal. No mucosal edema or rhinorrhea.     Right Turbinates: Not enlarged.     Left Turbinates: Not enlarged.     Mouth/Throat:     Mouth: Mucous membranes are dry.     Pharynx: Oropharynx is clear. No oropharyngeal exudate.  Eyes:     Pupils: Pupils are equal, round, and reactive to light.  Cardiovascular:     Rate and Rhythm: Normal rate and regular rhythm.     Pulses: Normal pulses.     Heart sounds: Normal heart sounds. No murmur heard.   Pulmonary:  Effort: Pulmonary effort is normal.     Breath sounds: Rales (Bibasilar) present. No decreased breath sounds or wheezing.  Musculoskeletal:     Cervical back: Normal range of motion.     Right lower leg: No edema.     Left lower leg: No edema.  Lymphadenopathy:     Cervical: No cervical adenopathy.  Skin:    General: Skin is warm and dry.     Capillary Refill: Capillary refill takes less than 2 seconds.     Findings: No erythema or rash.  Neurological:     General: No focal deficit present.      Mental Status: He is alert and oriented to person, place, and time.     Motor: No weakness.     Coordination: Coordination normal.     Gait: Gait is intact. Gait normal.  Psychiatric:        Mood and Affect: Mood normal.        Behavior: Behavior normal. Behavior is cooperative.        Thought Content: Thought content normal.        Judgment: Judgment normal.       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 Vaughan Browner in 3 months Work on increasing overall physical mobility Work on chair exercises as well as standing exercises Continue to work on increasing your cardiovascular exercise   OSA (obstructive sleep apnea) Plan: Continue BiPAP Contacted [employer] Representative Melissa, she relocated the April/2021 overnight oximetry results, she will have this faxed to our office  Dyspnea Stable ILD Stable pulmonary function testing Stable high-resolution CT imaging Also component of physical deconditioning  Healthcare maintenance Up-to-date with COVID-19 vaccinations Up-to-date with flu vaccine   History of pulmonary embolus (PE) Plan:   continue Eliquis    Return in about 3 months (around 01/01/2021), or if symptoms worsen or fail to improve, for Follow up with Dr. Vaughan Browner.   Lauraine Rinne, NP 10/01/2020   This appointment required 48 minutes of patient care (this includes precharting, chart review, review of results, face-to-face care, etc.).

## 2020-10-01 NOTE — Patient Instructions (Addendum)
You were seen today by Lauraine Rinne, NP  for:   1. ILD (interstitial lung disease) ([place]) 2. Dyspnea on exertion  Walk today in office   Lung functioning stable on pulmonary function testing and imaging this is reassuring  We will plan on repeating pulmonary function testing in 6 months-spirometry with DLCO 30-minute breathing test  3. OSA (obstructive sleep apnea)  We recommend that you continue using your BIPAP daily >>>Keep up the hard work using your device >>> Goal should be wearing this for the entire night that you are sleeping, at least 4 to 6 hours  Remember:  . Do not drive or operate heavy machinery if tired or drowsy.  . Please notify the supply company and office if you are unable to use your device regularly due to missing supplies or machine being broken.  . Work on maintaining a healthy weight and following your recommended nutrition plan  . Maintain proper daily exercise and movement  . Maintaining proper use of your device can also help improve management of other chronic illnesses such as: Blood pressure, blood sugars, and weight management.   BiPAP/ CPAP Cleaning:  >>>Clean weekly, with Dawn soap, and bottle brush.  Set up to air dry. >>> Wipe mask out daily with wet wipe or towelette    4. Healthcare maintenance  Great job being up-to-date on your flu vaccinations and COVID-19 vaccinations    Follow Up:    Return in about 3 months (around 01/01/2021), or if symptoms worsen or fail to improve, for Follow up with Dr. Vaughan Browner.   Notification of test results are managed in the following manner: If there are  any recommendations or changes to the  plan of care discussed in office today,  we will contact you and let you know what they are. If you do not hear from [place], then your results are normal and you can view them through your  MyChart account , or a letter will be sent to you. Thank you again for trusting [place] with your care  - Thank you, [facility]    It is flu season:   >>> Best ways to protect herself from the flu: Receive the yearly flu vaccine, practice good hand hygiene washing with soap and also using hand sanitizer when available, eat a nutritious meals, get adequate rest, hydrate appropriately       Please contact the office if your symptoms worsen or you have concerns that you are not improving.   Thank you for choosing [facility] for your healthcare, and for allowing [place] to partner with you on your healthcare journey. I am thankful to be able to provide care to you today.   Wyn Quaker FNP-C    Exercises To Do While Sitting  Exercises that you do while sitting (chair exercises) can give you many of the same benefits as full exercise. Benefits include strengthening your heart, burning calories, and keeping muscles and joints healthy. Exercise can also improve your mood and help with depression and anxiety. You may benefit from chair exercises if you are unable to do standing exercises because of:  Diabetic foot pain.  Obesity.  Illness.  Arthritis.  Recovery from surgery or injury.  Breathing problems.  Balance problems.  Another type of disability. Before starting chair exercises, check with your health care provider or a physical therapist to find out how much exercise you can tolerate and which exercises are safe for you. If your health care provider approves:  Start out slowly and build up over time. Aim to work up to about 10-20 minutes for each exercise session.  Make exercise part of your daily routine.  Drink water when you exercise. Do not wait until you are thirsty. Drink every 10-15 minutes.  Stop exercising right away if you have pain, nausea, shortness of breath, or dizziness.  If you are exercising in a wheelchair, make sure to lock the wheels.  Ask your health care provider whether you can do tai chi or yoga. Many positions in these mind-body exercises can be  modified to do while seated. Warm-up Before starting other exercises: 1. Sit up as straight as you can. Have your knees bent at 90 degrees, which is the shape of the capital letter "L." Keep your feet flat on the floor. 2. Sit at the front edge of your chair, if you can. 3. Pull in (tighten) the muscles in your abdomen and stretch your spine and neck as straight as you can. Hold this position for a few minutes. 4. Breathe in and out evenly. Try to concentrate on your breathing, and relax your mind. Stretching Exercise A: Arm stretch 1. Hold your arms out straight in front of your body. 2. Bend your hands at the wrist with your fingers pointing up, as if signaling someone to stop. Notice the slight tension in your forearms as you hold the position. 3. Keeping your arms out and your hands bent, rotate your hands outward as far as you can and hold this stretch. Aim to have your thumbs pointing up and your pinkie fingers pointing down. Slowly repeat arm stretches for one minute as tolerated. Exercise B: Leg stretch 1. If you can move your legs, try to "draw" letters on the floor with the toes of your foot. Write your name with one foot. 2. Write your name with the toes of your other foot. Slowly repeat the movements for one minute as tolerated. Exercise C: Reach for the sky 1. Reach your hands as far over your head as you can to stretch your spine. 2. Move your hands and arms as if you are climbing a rope. Slowly repeat the movements for one minute as tolerated. Range of motion exercises Exercise A: Shoulder roll 1. Let your arms hang loosely at your sides. 2. Lift just your shoulders up toward your ears, then let them relax back down. 3. When your shoulders feel loose, rotate your shoulders in backward and forward circles. Do shoulder rolls slowly for one minute as tolerated. Exercise B: March in place 1. As if you are marching, pump your arms and lift your legs up and down. Lift your knees  as high as you can. ? If you are unable to lift your knees, just pump your arms and move your ankles and feet up and down. March in place for one minute as tolerated. Exercise C: Seated jumping jacks 1. Let your arms hang down straight. 2. Keeping your arms straight, lift them up over your head. Aim to point your fingers to the ceiling. 3. While you lift your arms, straighten your legs and slide your heels along the floor to your sides, as wide as you can. 4. As you bring your arms back down to your sides, slide your legs back together. ? If you are unable to use your legs, just move your arms. Slowly repeat seated jumping jacks for one minute as tolerated. Strengthening exercises Exercise A: Shoulder squeeze 1. Hold your arms straight out from your  body to your sides, with your elbows bent and your fists pointed at the ceiling. 2. Keeping your arms in the bent position, move them forward so your elbows and forearms meet in front of your face. 3. Open your arms back out as wide as you can with your elbows still bent, until you feel your shoulder blades squeezing together. Hold for 5 seconds. Slowly repeat the movements forward and backward for one minute as tolerated. Contact a health care provider if you:  Had to stop exercising due to any of the following: ? Pain. ? Nausea. ? Shortness of breath. ? Dizziness. ? Fatigue.  Have significant pain or soreness after exercising. Get help right away if you have:  Chest pain.  Difficulty breathing. These symptoms may represent a serious problem that is an emergency. Do not wait to see if the symptoms will go away. Get medical help right away. Call your local emergency services (911 in the [place] Do not drive yourself to the hospital. This information is not intended to replace advice given to you by your health care provider. Make sure you discuss any questions you have with your health care provider. Document Revised: 02/17/2019 Document  Reviewed: 09/09/2017 Elsevier Patient Education  [place] Exercise  The sit-to-stand exercise (also known as the chair stand or chair rise exercise) strengthens your lower body and helps you maintain or improve your mobility and independence. The goal is to do the sit-to-stand exercise without using your hands. This will be easier as you become stronger. You should always talk with your health care provider before starting any exercise program, especially if you have had recent surgery. Do the exercise exactly as told by your health care provider and adjust it as directed. It is normal to feel mild stretching, pulling, tightness, or discomfort as you do this exercise, but you should stop right away if you feel sudden pain or your pain gets worse. Do not begin doing this exercise until told by your health care provider. What the sit-to-stand exercise does The sit-to-stand exercise helps to strengthen the muscles in your thighs and the muscles in the center of your body that give you stability (core muscles). This exercise is especially helpful if:  You have had knee or hip surgery.  You have trouble getting up from a chair, out of a car, or off the toilet. How to do the sit-to-stand exercise 1. Sit toward the front edge of a sturdy chair without armrests. Your knees should be bent and your feet should be flat on the floor and shoulder-width apart. 2. Place your hands lightly on each side of the seat. Keep your back and neck as straight as possible, with your chest slightly forward. 3. Breathe in slowly. Lean forward and slightly shift your weight to the front of your feet. 4. Breathe out as you slowly stand up. Use your hands as little as possible. 5. Stand and pause for a full breath in and out. 6. Breathe in as you sit down slowly. Tighten your core and abdominal muscles to control your lowering as much as possible. 7. Breathe out slowly. 8. Do this exercise 10-15  times. If needed, do it fewer times until you build up strength. 9. Rest for 1 minute, then do another set of 10-15 repetitions. To change the difficulty of the sit-to-stand exercise  If the exercise is too difficult, use a chair with sturdy armrests, and push off the armrests to help you come  to the standing position. You can also use the armrests to help slowly lower yourself back to sitting. As this gets easier, try to use your arms less. You can also place a firm cushion or pillow on the chair to make the surface higher.  If this exercise is too easy, do not use your arms to help raise or lower yourself. You can also wear a weighted vest, use hand weights, increase your repetitions, or try a lower chair. General tips  You may feel tired when starting an exercise routine. This is normal.  You may have muscle soreness that lasts a few days. This is normal. As you get stronger, you may not feel muscle soreness.  Use smooth, steady movements.  Do not  hold your breath during strength exercises. This can cause unsafe changes in your blood pressure.  Breathe in slowly through your nose, and breathe out slowly through your mouth. Summary  Strengthening your lower body is an important step to help you move safely and independently.  The sit-to-stand exercise helps strengthen the muscles in your thighs and core.  You should always talk with your health care provider before starting any exercise program, especially if you have had recent surgery. This information is not intended to replace advice given to you by your health care provider. Make sure you discuss any questions you have with your health care provider. Document Revised: 08/25/2018 Document Reviewed: 12/18/2016 Elsevier Patient Education  [place].

## 2020-10-03 DIAGNOSIS — M546 Pain in thoracic spine: Secondary | ICD-10-CM | POA: Diagnosis not present

## 2020-10-03 DIAGNOSIS — M9902 Segmental and somatic dysfunction of thoracic region: Secondary | ICD-10-CM | POA: Diagnosis not present

## 2020-10-03 DIAGNOSIS — M9903 Segmental and somatic dysfunction of lumbar region: Secondary | ICD-10-CM | POA: Diagnosis not present

## 2020-10-03 DIAGNOSIS — S233XXA Sprain of ligaments of thoracic spine, initial encounter: Secondary | ICD-10-CM | POA: Diagnosis not present

## 2020-10-03 DIAGNOSIS — S2341XA Sprain of ribs, initial encounter: Secondary | ICD-10-CM | POA: Diagnosis not present

## 2020-10-09 DIAGNOSIS — I129 Hypertensive chronic kidney disease with stage 1 through stage 4 chronic kidney disease, or unspecified chronic kidney disease: Secondary | ICD-10-CM | POA: Diagnosis not present

## 2020-10-09 DIAGNOSIS — N183 Chronic kidney disease, stage 3 unspecified: Secondary | ICD-10-CM | POA: Diagnosis not present

## 2020-10-09 DIAGNOSIS — E1122 Type 2 diabetes mellitus with diabetic chronic kidney disease: Secondary | ICD-10-CM | POA: Diagnosis not present

## 2020-10-10 DIAGNOSIS — S233XXA Sprain of ligaments of thoracic spine, initial encounter: Secondary | ICD-10-CM | POA: Diagnosis not present

## 2020-10-10 DIAGNOSIS — S2341XA Sprain of ribs, initial encounter: Secondary | ICD-10-CM | POA: Diagnosis not present

## 2020-10-10 DIAGNOSIS — M9902 Segmental and somatic dysfunction of thoracic region: Secondary | ICD-10-CM | POA: Diagnosis not present

## 2020-10-10 DIAGNOSIS — M9903 Segmental and somatic dysfunction of lumbar region: Secondary | ICD-10-CM | POA: Diagnosis not present

## 2020-10-10 DIAGNOSIS — M546 Pain in thoracic spine: Secondary | ICD-10-CM | POA: Diagnosis not present

## 2020-10-12 ENCOUNTER — Telehealth: Payer: Self-pay | Admitting: Pulmonary Disease

## 2020-10-12 NOTE — Telephone Encounter (Signed)
10/12/2020  Received documentation of patient's overnight oximetry that he completed in April/2021.  Those results are listed below:  02/17/2020-overnight oximetry on room air on BiPAP (IPAP 17 cm, EPAP setting 13 cm)-duration of sleep 8 hours and 31 minutes, time spent below 88% 8 seconds  Patient does not qualify for nocturnal oxygen use  This is reassuring information.  We will send this information to be scanned into patient's chart.  Please contact the patient and let them know that we have finally received these results and that we have no new recommendations.  Wyn Quaker, FNP

## 2020-10-15 NOTE — Telephone Encounter (Signed)
I called and spoke with patient regarding ONO results. Patient verbalized understanding and nothing further was needed.

## 2020-10-24 DIAGNOSIS — M9902 Segmental and somatic dysfunction of thoracic region: Secondary | ICD-10-CM | POA: Diagnosis not present

## 2020-10-24 DIAGNOSIS — M9903 Segmental and somatic dysfunction of lumbar region: Secondary | ICD-10-CM | POA: Diagnosis not present

## 2020-10-24 DIAGNOSIS — S2341XA Sprain of ribs, initial encounter: Secondary | ICD-10-CM | POA: Diagnosis not present

## 2020-10-24 DIAGNOSIS — M546 Pain in thoracic spine: Secondary | ICD-10-CM | POA: Diagnosis not present

## 2020-10-24 DIAGNOSIS — S233XXA Sprain of ligaments of thoracic spine, initial encounter: Secondary | ICD-10-CM | POA: Diagnosis not present

## 2020-10-29 DIAGNOSIS — G4733 Obstructive sleep apnea (adult) (pediatric): Secondary | ICD-10-CM | POA: Diagnosis not present

## 2020-11-07 DIAGNOSIS — M9902 Segmental and somatic dysfunction of thoracic region: Secondary | ICD-10-CM | POA: Diagnosis not present

## 2020-11-07 DIAGNOSIS — S233XXA Sprain of ligaments of thoracic spine, initial encounter: Secondary | ICD-10-CM | POA: Diagnosis not present

## 2020-11-07 DIAGNOSIS — S2341XA Sprain of ribs, initial encounter: Secondary | ICD-10-CM | POA: Diagnosis not present

## 2020-11-07 DIAGNOSIS — M9903 Segmental and somatic dysfunction of lumbar region: Secondary | ICD-10-CM | POA: Diagnosis not present

## 2020-11-07 DIAGNOSIS — M546 Pain in thoracic spine: Secondary | ICD-10-CM | POA: Diagnosis not present

## 2020-11-09 DIAGNOSIS — M6281 Muscle weakness (generalized): Secondary | ICD-10-CM | POA: Diagnosis not present

## 2020-11-09 DIAGNOSIS — E1122 Type 2 diabetes mellitus with diabetic chronic kidney disease: Secondary | ICD-10-CM | POA: Diagnosis not present

## 2020-11-09 DIAGNOSIS — N183 Chronic kidney disease, stage 3 unspecified: Secondary | ICD-10-CM | POA: Diagnosis not present

## 2020-11-09 DIAGNOSIS — I129 Hypertensive chronic kidney disease with stage 1 through stage 4 chronic kidney disease, or unspecified chronic kidney disease: Secondary | ICD-10-CM | POA: Diagnosis not present

## 2020-11-09 DIAGNOSIS — Z9181 History of falling: Secondary | ICD-10-CM | POA: Diagnosis not present

## 2020-11-09 DIAGNOSIS — R262 Difficulty in walking, not elsewhere classified: Secondary | ICD-10-CM | POA: Diagnosis not present

## 2020-11-16 DIAGNOSIS — Z9181 History of falling: Secondary | ICD-10-CM | POA: Diagnosis not present

## 2020-11-16 DIAGNOSIS — M6281 Muscle weakness (generalized): Secondary | ICD-10-CM | POA: Diagnosis not present

## 2020-11-16 DIAGNOSIS — R262 Difficulty in walking, not elsewhere classified: Secondary | ICD-10-CM | POA: Diagnosis not present

## 2020-11-20 DIAGNOSIS — M6281 Muscle weakness (generalized): Secondary | ICD-10-CM | POA: Diagnosis not present

## 2020-11-20 DIAGNOSIS — R262 Difficulty in walking, not elsewhere classified: Secondary | ICD-10-CM | POA: Diagnosis not present

## 2020-11-20 DIAGNOSIS — Z9181 History of falling: Secondary | ICD-10-CM | POA: Diagnosis not present

## 2020-11-23 DIAGNOSIS — R262 Difficulty in walking, not elsewhere classified: Secondary | ICD-10-CM | POA: Diagnosis not present

## 2020-11-23 DIAGNOSIS — Z9181 History of falling: Secondary | ICD-10-CM | POA: Diagnosis not present

## 2020-11-23 DIAGNOSIS — M6281 Muscle weakness (generalized): Secondary | ICD-10-CM | POA: Diagnosis not present

## 2020-11-28 ENCOUNTER — Other Ambulatory Visit: Payer: Self-pay | Admitting: Pulmonary Disease

## 2020-12-05 DIAGNOSIS — L110 Acquired keratosis follicularis: Secondary | ICD-10-CM | POA: Diagnosis not present

## 2020-12-05 DIAGNOSIS — E1140 Type 2 diabetes mellitus with diabetic neuropathy, unspecified: Secondary | ICD-10-CM | POA: Diagnosis not present

## 2020-12-05 DIAGNOSIS — B351 Tinea unguium: Secondary | ICD-10-CM | POA: Diagnosis not present

## 2020-12-08 DIAGNOSIS — E7849 Other hyperlipidemia: Secondary | ICD-10-CM | POA: Diagnosis not present

## 2020-12-08 DIAGNOSIS — I129 Hypertensive chronic kidney disease with stage 1 through stage 4 chronic kidney disease, or unspecified chronic kidney disease: Secondary | ICD-10-CM | POA: Diagnosis not present

## 2020-12-08 DIAGNOSIS — N183 Chronic kidney disease, stage 3 unspecified: Secondary | ICD-10-CM | POA: Diagnosis not present

## 2020-12-08 DIAGNOSIS — E1122 Type 2 diabetes mellitus with diabetic chronic kidney disease: Secondary | ICD-10-CM | POA: Diagnosis not present

## 2020-12-11 DIAGNOSIS — D696 Thrombocytopenia, unspecified: Secondary | ICD-10-CM | POA: Diagnosis not present

## 2020-12-11 DIAGNOSIS — E7849 Other hyperlipidemia: Secondary | ICD-10-CM | POA: Diagnosis not present

## 2020-12-11 DIAGNOSIS — I2699 Other pulmonary embolism without acute cor pulmonale: Secondary | ICD-10-CM | POA: Diagnosis not present

## 2020-12-11 DIAGNOSIS — K5100 Ulcerative (chronic) pancolitis without complications: Secondary | ICD-10-CM | POA: Diagnosis not present

## 2020-12-11 DIAGNOSIS — K7581 Nonalcoholic steatohepatitis (NASH): Secondary | ICD-10-CM | POA: Diagnosis not present

## 2020-12-11 DIAGNOSIS — E1122 Type 2 diabetes mellitus with diabetic chronic kidney disease: Secondary | ICD-10-CM | POA: Diagnosis not present

## 2020-12-11 DIAGNOSIS — G43909 Migraine, unspecified, not intractable, without status migrainosus: Secondary | ICD-10-CM | POA: Diagnosis not present

## 2020-12-11 DIAGNOSIS — I10 Essential (primary) hypertension: Secondary | ICD-10-CM | POA: Diagnosis not present

## 2021-01-07 DIAGNOSIS — N183 Chronic kidney disease, stage 3 unspecified: Secondary | ICD-10-CM | POA: Diagnosis not present

## 2021-01-07 DIAGNOSIS — E1122 Type 2 diabetes mellitus with diabetic chronic kidney disease: Secondary | ICD-10-CM | POA: Diagnosis not present

## 2021-01-07 DIAGNOSIS — I129 Hypertensive chronic kidney disease with stage 1 through stage 4 chronic kidney disease, or unspecified chronic kidney disease: Secondary | ICD-10-CM | POA: Diagnosis not present

## 2021-01-07 DIAGNOSIS — E7849 Other hyperlipidemia: Secondary | ICD-10-CM | POA: Diagnosis not present

## 2021-01-31 ENCOUNTER — Encounter: Payer: Self-pay | Admitting: Pulmonary Disease

## 2021-01-31 ENCOUNTER — Ambulatory Visit: Payer: Medicare Other | Admitting: Pulmonary Disease

## 2021-01-31 ENCOUNTER — Other Ambulatory Visit: Payer: Self-pay

## 2021-01-31 VITALS — BP 138/80 | HR 85 | Temp 97.5°F | Ht 72.0 in | Wt 208.4 lb

## 2021-01-31 DIAGNOSIS — R0600 Dyspnea, unspecified: Secondary | ICD-10-CM

## 2021-01-31 DIAGNOSIS — Z86711 Personal history of pulmonary embolism: Secondary | ICD-10-CM

## 2021-01-31 DIAGNOSIS — J849 Interstitial pulmonary disease, unspecified: Secondary | ICD-10-CM

## 2021-01-31 DIAGNOSIS — R0609 Other forms of dyspnea: Secondary | ICD-10-CM

## 2021-01-31 DIAGNOSIS — G4733 Obstructive sleep apnea (adult) (pediatric): Secondary | ICD-10-CM | POA: Diagnosis not present

## 2021-01-31 NOTE — Progress Notes (Signed)
Manuel Atkins    [id number]    1933/04/07  Primary Care Physician:Daniel, Mitzie Na, MD  Referring Physician: Caryl Bis, MD [address] [place],  [place] [address]  Chief complaint: Follow up for interstitial lung disease.   HPI: 87 year old with history of OSA, ulcerative colitis, GERD, elevated transaminitis.  Hospitalized in early January 2019 at [facility] for dyspnea on exertion, hypoxia.  He had been evaluated with echo showing normal EF, proBNP was normal, d-dimer was normal.  He was started on Lasix without improvement.  He had a CT scan which showed mild bronchiectasis with basal interstitial lung disease.  He was started on 20 mg of prednisone and referred to pulmonary.    He was seen by pulmonary at [facility] but wants to transition care to [facility]. He follows with Dr. Laural Golden for ulcerative colitis and is on infliximab and mesalamine.  GERD is complicated by esophageal stricture status post dilation 4 years ago.  Symptoms are stable on PPI.  Elevated transaminases are thought to be secondary to fatty liver.  He still has occasional dysphagia and choking on food. Seen by Dr. Laural Golden and underwent dilation of esophageal stricture on 12/15/17.  Heartburn symptoms are controlled on therapy.  He is had a work-up by Dr. Virgina Jock, cardiology with right and left heart cath showing nonobstructive coronary artery disease, no evidence of pulmonary hypertension. Finished pulmonary rehab in 2019  Diagnosed with segmental pulmonary embolism in November 2020 and currently on Eliquis anticoagulation.  He has discussed with his GI doctor and taken off infliximab in January 2021   Pets: Has dogs.  Exposed to farm animals in childhood.  No birds Occupation: Retired Art gallery manager.  Used to work in [employer]. Exposures: May have been exposed to asbestos.  Reports exposure to cotton dust.  He has a hobby of woodworking and is exposed to wood dust.  No mold, hot tubs.  jacuzzi. Smoking history: 10-pack-year smoking history.  Quit in 1980  Travel History: Not significant  Interim history: Continues to have dyspnea on exertion.  States that it may be slightly worse compared to before. No new complaints Using the BiPAP and is doing well with it.  Had an overnight oximetry done several months ago but apparently there is a problem with completion of the study.  No records available of this test in epic  Outpatient Encounter Medications as of 01/31/2021  Medication Sig  . acetaminophen (TYLENOL) 500 MG tablet Take 1,000 mg by mouth every 6 (six) hours as needed for moderate pain or headache.  . ALPRAZolam (XANAX) 0.25 MG tablet Take 0.5 tablets (0.125 mg total) by mouth 3 (three) times daily as needed for anxiety. for anxiety  . diphenhydrAMINE (BENADRYL) 50 MG capsule Take 50 mg by mouth. 1 daily.  Marland Kitchen ELIQUIS 5 MG TABS tablet TAKE ONE TABLET BY MOUTH TWICE DAILY  . glipiZIDE (GLUCOTROL XL) 2.5 MG 24 hr tablet Take 2.5 mg by mouth daily with breakfast.   . imipramine (TOFRANIL) 25 MG tablet Take 75 mg by mouth at bedtime  . latanoprost (XALATAN) 0.005 % ophthalmic solution Place 1 drop into both eyes at bedtime.  . metFORMIN (GLUCOPHAGE-XR) 500 MG 24 hr tablet Take 500-1,000 mg by mouth See admin instructions. Patient takes 500 mg in the morning and 1000 mg at night.  . pantoprazole (PROTONIX) 40 MG tablet TAKE ONE TABLET BY MOUTH EVERY *OTHER* DAY.  . Probiotic Product (PROBIOTIC DAILY PO) Take 1  capsule by mouth daily.   . rosuvastatin (CRESTOR) 10 MG tablet Take 10 mg by mouth every evening.   . tamsulosin (FLOMAX) 0.4 MG CAPS capsule Take 0.4 mg by mouth at bedtime.  . [DISCONTINUED] loratadine (CLARITIN) 10 MG tablet Take 10 mg by mouth every 8 (eight) weeks.   No facility-administered encounter medications on file as of 01/31/2021.   Physical Exam: Blood pressure 130/72, pulse 72, temperature (!) 96.1 F (35.6 C), temperature source Oral, height 5'  11.5" (1.816 m), weight 207 lb (93.9 kg), SpO2 98 %. Gen:      No acute distress HEENT:  EOMI, sclera anicteric Neck:     No masses; no thyromegaly Lungs:    Minimal bibasilar crackles CV:         Regular rate and rhythm; no murmurs Abd:      + bowel sounds; soft, non-tender; no palpable masses, no distension Ext:    No edema; adequate peripheral perfusion Skin:      Warm and dry; no rash Neuro: alert and oriented x 3 Psych: normal mood and affect  Data Reviewed: CT scan 07/27/2008-mild basal atelectasis right upper lobe pulmonary embolism CT scan 11/16/17-mild bronchiectasis, basal reticulation right greater than left.  Borderline right hilar lymphadenopathy.  Nodular liver contour possible cirrhosis High-resolution CT 01/26/18- patchy ground glass attenuation, mild bronchiectasis, septal thickening with no basal gradient.  No honeycombing. Indeterminate for UIP.  Hepatic steatosis, aortic atherosclerosis, left main and left anterior coronary artery disease. High-resolution CT 05/04/2018-mild basal predominant lung fibrosis stable from prior exam. High-resolution CT 10/18/2018-stable interstitial lung disease CTA 09/26/2019-focal filling defect within a subsegmental branch of the right lower lobe pulmonary artery.  Equivocal for PE High-res CT 08/21/2020-stable interstitial lung disease I have reviewed the images personally.  Barium swallow 05/31/13- mild impairment of esophageal motility, stricture at GE junction with obstruction of barium tablet.  Labs Connective tissue serologies 11/23/17-ANA, ACE, CCP, rheumatoid factor all negative Hypersenitivity panel-negative  PFTs  12/23/17 FVC 2.30 (54%), FEV1 1.97 [66%], F/F 86, TLC 64%, DLCO 42%  05/18/2018 FVC 2.32 [55%), FEV1 1.91 [65%], F/F 82, DLCO 43%  10/20/2018 FVC 2.30 [55%], FEV1 1.96 [6 6%], F/F 85, DLCO 46%  10/24/2019 FVC 2.29 [5%], FEV1 2.03 [70%],/89, TLC 3.63 [49%], DLCO 13.57 [54%],  10/01/2020 FVC 2.28 [56%], FEV1 1.99  [69%], TLC 4.75 (64%), DLCO 15.21 [61%] Severe restriction, moderate diffusion defect  6-minute walk 03/03/18- 338 m 6-minute walk 05/20/18- 288 m 6-minute walk 10/27/18- 363 m  Cardiac RHC 04/20/18 RA: 9 mmHg RV: 29/7 mmHg PA: 29/12 mmHg, mean PAP 19 mmHg PCWP: 9 mmHg LVEDP: 14 mmHg  CO: 4.8 L/min CI: 2.3 L/min/m2  Sleep Received copy of sleep study from [facility], 08/01/2011 BiPAP titration.  Titrated to IPAP 17, EPAP 13.  Severe PLM's noted.  Consider treatment for restless leg syndrome.  Overnight oximetry 02/17/2020 overnight oximetry on room air on BiPAP (IPAP 17 cm, EPAP setting 13 cm)-duration of sleep 8 hours and 31 minutes, time spent below 88% 8 seconds.  Did not qualify for nocturnal oxygen use  Assessment:  Follow-up for bronchiectasis, interstitial lung disease. He could have interstitial lung disease from occupational, recreational exposure to cotton dust, wood dust, possible asbestos.  Other considerations include pneumonitis from ulcerative colitis or from infliximab, meslamine therapy or chronic aspiration from esophageal stricture.  Connective tissue serologies are negative. There is no clear evidence that he has IPF.   Cardiac work-up as noted above with no evidence of significant coronary artery  disease or pulmonary hypertension. Cardio pulmonary exercise test shows dyspnea likely from combination of cardiac, pulmonary factors. For definite diagnosis of ILD and treatment he will need an open lung biopsy. We had an extensive discussion about risks benefits of the biopsy versus empirically treating him with anti-fibrotics Discussed the option of Envisia test through bronchoscope but he would like to defer all biopsies  Overall he is remained stable over the past couple of years and hence we are continuing conservative management Repeat high-res CT and PFTs November 2022  Pulmonary embolism Findings are equivocal with nonspecific filling defects. He is  currently on Eliquis Would continue anticoagulation given his underlying lung issues and significant dyspnea  OSA Continues on BiPAP Overnight oximetry in 2021 reviewed with no need for supplemental oxygen Download today shows good compliance and response  Plan/Recommendations: - High-res CT, PFTs - Continue BiPAP  Marshell Garfinkel MD  Pulmonary and Critical Care 01/31/2021, 9:43 AM  CC: Caryl Bis, MD

## 2021-01-31 NOTE — Addendum Note (Signed)
Addended by: Elton Sin on: 01/31/2021 10:00 AM   Modules accepted: Orders

## 2021-01-31 NOTE — Patient Instructions (Signed)
I am glad you are stable with regard to your breathing We will get follow-up CT and PFTs in Nov of 2022 Return to clinic after these tests

## 2021-02-06 DIAGNOSIS — E1122 Type 2 diabetes mellitus with diabetic chronic kidney disease: Secondary | ICD-10-CM | POA: Diagnosis not present

## 2021-02-06 DIAGNOSIS — N183 Chronic kidney disease, stage 3 unspecified: Secondary | ICD-10-CM | POA: Diagnosis not present

## 2021-02-06 DIAGNOSIS — E7849 Other hyperlipidemia: Secondary | ICD-10-CM | POA: Diagnosis not present

## 2021-02-06 DIAGNOSIS — I129 Hypertensive chronic kidney disease with stage 1 through stage 4 chronic kidney disease, or unspecified chronic kidney disease: Secondary | ICD-10-CM | POA: Diagnosis not present

## 2021-03-06 DIAGNOSIS — E1140 Type 2 diabetes mellitus with diabetic neuropathy, unspecified: Secondary | ICD-10-CM | POA: Diagnosis not present

## 2021-03-06 DIAGNOSIS — B351 Tinea unguium: Secondary | ICD-10-CM | POA: Diagnosis not present

## 2021-03-06 DIAGNOSIS — L110 Acquired keratosis follicularis: Secondary | ICD-10-CM | POA: Diagnosis not present

## 2021-03-07 DIAGNOSIS — H524 Presbyopia: Secondary | ICD-10-CM | POA: Diagnosis not present

## 2021-03-07 DIAGNOSIS — H401211 Low-tension glaucoma, right eye, mild stage: Secondary | ICD-10-CM | POA: Diagnosis not present

## 2021-04-06 DIAGNOSIS — H6123 Impacted cerumen, bilateral: Secondary | ICD-10-CM | POA: Diagnosis not present

## 2021-04-06 DIAGNOSIS — H9190 Unspecified hearing loss, unspecified ear: Secondary | ICD-10-CM | POA: Diagnosis not present

## 2021-04-23 DIAGNOSIS — L570 Actinic keratosis: Secondary | ICD-10-CM | POA: Diagnosis not present

## 2021-04-23 DIAGNOSIS — D225 Melanocytic nevi of trunk: Secondary | ICD-10-CM | POA: Diagnosis not present

## 2021-04-23 DIAGNOSIS — X32XXXD Exposure to sunlight, subsequent encounter: Secondary | ICD-10-CM | POA: Diagnosis not present

## 2021-04-23 DIAGNOSIS — Z85820 Personal history of malignant melanoma of skin: Secondary | ICD-10-CM | POA: Diagnosis not present

## 2021-04-23 DIAGNOSIS — Z08 Encounter for follow-up examination after completed treatment for malignant neoplasm: Secondary | ICD-10-CM | POA: Diagnosis not present

## 2021-04-23 DIAGNOSIS — Z1283 Encounter for screening for malignant neoplasm of skin: Secondary | ICD-10-CM | POA: Diagnosis not present

## 2021-04-29 DIAGNOSIS — K219 Gastro-esophageal reflux disease without esophagitis: Secondary | ICD-10-CM | POA: Diagnosis not present

## 2021-04-29 DIAGNOSIS — E1122 Type 2 diabetes mellitus with diabetic chronic kidney disease: Secondary | ICD-10-CM | POA: Diagnosis not present

## 2021-04-29 DIAGNOSIS — E7849 Other hyperlipidemia: Secondary | ICD-10-CM | POA: Diagnosis not present

## 2021-04-29 DIAGNOSIS — N1832 Chronic kidney disease, stage 3b: Secondary | ICD-10-CM | POA: Diagnosis not present

## 2021-04-29 DIAGNOSIS — R5383 Other fatigue: Secondary | ICD-10-CM | POA: Diagnosis not present

## 2021-04-29 DIAGNOSIS — E782 Mixed hyperlipidemia: Secondary | ICD-10-CM | POA: Diagnosis not present

## 2021-04-29 DIAGNOSIS — Z1329 Encounter for screening for other suspected endocrine disorder: Secondary | ICD-10-CM | POA: Diagnosis not present

## 2021-05-01 DIAGNOSIS — I2699 Other pulmonary embolism without acute cor pulmonale: Secondary | ICD-10-CM | POA: Diagnosis not present

## 2021-05-01 DIAGNOSIS — E7849 Other hyperlipidemia: Secondary | ICD-10-CM | POA: Diagnosis not present

## 2021-05-01 DIAGNOSIS — I10 Essential (primary) hypertension: Secondary | ICD-10-CM | POA: Diagnosis not present

## 2021-05-01 DIAGNOSIS — M549 Dorsalgia, unspecified: Secondary | ICD-10-CM | POA: Diagnosis not present

## 2021-05-01 DIAGNOSIS — R4582 Worries: Secondary | ICD-10-CM | POA: Diagnosis not present

## 2021-05-01 DIAGNOSIS — E1122 Type 2 diabetes mellitus with diabetic chronic kidney disease: Secondary | ICD-10-CM | POA: Diagnosis not present

## 2021-05-01 DIAGNOSIS — G43909 Migraine, unspecified, not intractable, without status migrainosus: Secondary | ICD-10-CM | POA: Diagnosis not present

## 2021-05-01 DIAGNOSIS — Z0001 Encounter for general adult medical examination with abnormal findings: Secondary | ICD-10-CM | POA: Diagnosis not present

## 2021-05-01 DIAGNOSIS — R300 Dysuria: Secondary | ICD-10-CM | POA: Diagnosis not present

## 2021-05-07 ENCOUNTER — Ambulatory Visit (INDEPENDENT_AMBULATORY_CARE_PROVIDER_SITE_OTHER): Payer: Medicare Other | Admitting: Internal Medicine

## 2021-05-07 ENCOUNTER — Encounter (INDEPENDENT_AMBULATORY_CARE_PROVIDER_SITE_OTHER): Payer: Self-pay | Admitting: Internal Medicine

## 2021-05-07 ENCOUNTER — Other Ambulatory Visit: Payer: Self-pay

## 2021-05-07 VITALS — BP 149/77 | HR 99 | Ht 72.0 in | Wt 206.0 lb

## 2021-05-07 DIAGNOSIS — K5130 Ulcerative (chronic) rectosigmoiditis without complications: Secondary | ICD-10-CM | POA: Diagnosis not present

## 2021-05-07 DIAGNOSIS — K219 Gastro-esophageal reflux disease without esophagitis: Secondary | ICD-10-CM | POA: Diagnosis not present

## 2021-05-07 NOTE — Patient Instructions (Signed)
Notify of your diarrhea rectal bleeding. Notify if swallowing difficulty worsens or pantoprazole stops working.

## 2021-05-07 NOTE — Progress Notes (Signed)
Presenting complaint;  Follow for chronic ulcerative colitis and GERD.  Database and subjective:  Patient is 87 year old Caucasian male who has over 55-year history of ulcerative colitis who has remained in remission for more than 5 years.  He required biologic for control of his disease.  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  Last infliximab dose was in February 2020. He has not had a colonoscopy since July 2013 when he had active disease. He also has a history of GERD complicated by distal esophageal stricture which was dilated in February 2019. He was last seen 1 year ago. He is accompanied by his wife Dalene Seltzer.  He has no GI complaints.  He generally has 1 formed stool daily.  He denies melena or rectal bleeding or abdominal pain.  His appetite is very good.  His his weight is down by 2 pounds since his last visit. He feels heartburn is well controlled with PPI every other day.  He may have it once in a while triggered by certain foods.  He has dysphagia only with large pills but not with food. Few months ago he he developed pruritic rash.  He is not needing Benadryl anymore. He is having gait issues.  His wife states that he has fallen few times.  He is using walker to ambulate. He is scheduled to be evaluated by physical therapy in near future. He says his breathing is about the same.  Current Medications: Outpatient Encounter Medications as of 05/07/2021  Medication Sig   acetaminophen (TYLENOL) 500 MG tablet Take 1,000 mg by mouth every 6 (six) hours as needed for moderate pain or headache.   ALPRAZolam (XANAX) 0.25 MG tablet Take 0.5 tablets (0.125 mg total) by mouth 3 (three) times daily as needed for anxiety. for anxiety   diphenhydrAMINE (BENADRYL) 50 MG capsule Take 50 mg by mouth. 1 daily.   ELIQUIS 5 MG TABS tablet TAKE ONE TABLET BY MOUTH TWICE DAILY   glipiZIDE (GLUCOTROL XL) 5 MG 24 hr tablet Take 10 mg by mouth daily with breakfast.   imipramine  (TOFRANIL) 25 MG tablet Take 75 mg by mouth at bedtime   latanoprost (XALATAN) 0.005 % ophthalmic solution Place 1 drop into both eyes at bedtime.   losartan (COZAAR) 100 MG tablet Take 100 mg by mouth daily. Patient unsure of the mg   metFORMIN (GLUCOPHAGE-XR) 500 MG 24 hr tablet Take 500-1,000 mg by mouth See admin instructions. Patient takes 500 mg in the morning and 1000 mg at night.   pantoprazole (PROTONIX) 40 MG tablet TAKE ONE TABLET BY MOUTH EVERY *OTHER* DAY.   Probiotic Product (PROBIOTIC DAILY PO) Take 1 capsule by mouth daily.    rosuvastatin (CRESTOR) 10 MG tablet Take 10 mg by mouth every evening.    tamsulosin (FLOMAX) 0.4 MG CAPS capsule Take 0.4 mg by mouth at bedtime.   No facility-administered encounter medications on file as of 05/07/2021.     Objective: Blood pressure (!) 149/77, pulse 99, height 6' (1.829 m), weight 206 lb (93.4 kg). Patient is alert and in no acute distress. He is wearing a mask. He is holding onto examination table table as he moved from chair to the table to be examined. Conjunctiva is pink. Sclera is nonicteric Oropharyngeal mucosa is normal. No neck masses or thyromegaly noted. Cardiac exam with regular rhythm normal S1 and S2.  He has grade 2/6 systolic murmur best heard at aortic area. Auscultation of lungs reveal vesicular breath sounds except  he has few rhonchi at right base. Abdomen No LE edema or clubbing noted.  Labs/studies Results:   CBC Latest Ref Rng & Units 09/27/2019 09/26/2019 06/23/2019  WBC 4.0 - 10.5 K/uL 7.5 8.2 7.8  Hemoglobin 13.0 - 17.0 g/dL 12.7(L) 13.6 14.3  Hematocrit 39.0 - 52.0 % 38.7(L) 39.8 41.9  Platelets 150 - 400 K/uL 167 168 180.0    CMP Latest Ref Rng & Units 09/27/2019 09/26/2019 09/26/2019  Glucose 70 - 99 mg/dL 197(H) 116(H) -  BUN 8 - 23 mg/dL 25(H) 23 -  Creatinine 0.61 - 1.24 mg/dL 1.66(H) 1.70(H) 1.65(H)  Sodium 135 - 145 mmol/L 137 135 -  Potassium 3.5 - 5.1 mmol/L 4.2 4.4 -  Chloride 98 - 111  mmol/L 103 101 -  CO2 22 - 32 mmol/L 24 25 -  Calcium 8.9 - 10.3 mg/dL 8.6(L) 9.2 -  Total Protein 6.0 - 8.3 g/dL - - -  Total Bilirubin 0.2 - 1.2 mg/dL - - -  Alkaline Phos 39 - 117 U/L - - -  AST 0 - 37 U/L - - -  ALT 0 - 53 U/L - - -    Hepatic Function Latest Ref Rng & Units 06/23/2019 11/08/2012 09/24/2012  Total Protein 6.0 - 8.3 g/dL 8.4(H) 6.6 6.9  Albumin 3.5 - 5.2 g/dL 4.9 2.7(L) 3.6  AST 0 - 37 U/L _0 ALT 0 - 53 U/L 35 24 39  Alk Phosphatase 39 - 117 U/L 57 52 52  Total Bilirubin 0.2 - 1.2 mg/dL 1.1 0.9 1.2    Lab data provided by the patient from 04/29/2021  WBC 8.4 H&H 14.8 and 44.6 Platelet count 190K Chemistry panel pertinent for glucose of 214, BUN 22 and creatinine 1.56 Bilirubin 1.0 albumin 4.9. Second page of the report was not provided.   Assessment:  #1.  Chronic ulcerative colitis.  He was diagnosed over 55 years ago.  He remains in remission.  He has been off infliximab for 28 months.  He has not had surveillance examination in several years.  He is inclined not to undergo endoscopic evaluation unless symptoms relapse.  #2.  Chronic GERD complicated by distal esophageal stricture which was last dilated in February 2019.  He is doing well with PPI every other day.  #3.  Ataxia.  He is experience falling episodes which is very worrisome given that he is on anticoagulants. He is to be evaluated by physical therapy in near future.  He should also consider neurologic evaluation.  He will discuss with Dr. Gar Ponto.  Plan:  Continue pantoprazole 40 mg every other day as before. Patient will call office if he develops dysphagia and diarrhea or rectal bleeding. Office visit in 1 year.

## 2021-05-09 DIAGNOSIS — N183 Chronic kidney disease, stage 3 unspecified: Secondary | ICD-10-CM | POA: Diagnosis not present

## 2021-05-09 DIAGNOSIS — E1122 Type 2 diabetes mellitus with diabetic chronic kidney disease: Secondary | ICD-10-CM | POA: Diagnosis not present

## 2021-05-09 DIAGNOSIS — E7849 Other hyperlipidemia: Secondary | ICD-10-CM | POA: Diagnosis not present

## 2021-05-09 DIAGNOSIS — M6281 Muscle weakness (generalized): Secondary | ICD-10-CM | POA: Diagnosis not present

## 2021-05-09 DIAGNOSIS — I129 Hypertensive chronic kidney disease with stage 1 through stage 4 chronic kidney disease, or unspecified chronic kidney disease: Secondary | ICD-10-CM | POA: Diagnosis not present

## 2021-05-09 DIAGNOSIS — R296 Repeated falls: Secondary | ICD-10-CM | POA: Diagnosis not present

## 2021-05-12 ENCOUNTER — Encounter (HOSPITAL_COMMUNITY): Payer: Self-pay | Admitting: Emergency Medicine

## 2021-05-12 ENCOUNTER — Emergency Department (HOSPITAL_COMMUNITY)
Admission: EM | Admit: 2021-05-12 | Discharge: 2021-05-12 | Disposition: A | Discharge status: Home / Self Care | Payer: Medicare Other | Attending: Emergency Medicine | Admitting: Emergency Medicine

## 2021-05-12 ENCOUNTER — Other Ambulatory Visit: Payer: Self-pay

## 2021-05-12 ENCOUNTER — Emergency Department (HOSPITAL_COMMUNITY): Payer: Medicare Other

## 2021-05-12 DIAGNOSIS — E875 Hyperkalemia: Secondary | ICD-10-CM | POA: Insufficient documentation

## 2021-05-12 DIAGNOSIS — I6782 Cerebral ischemia: Secondary | ICD-10-CM | POA: Diagnosis not present

## 2021-05-12 DIAGNOSIS — Z7901 Long term (current) use of anticoagulants: Secondary | ICD-10-CM | POA: Diagnosis not present

## 2021-05-12 DIAGNOSIS — I6529 Occlusion and stenosis of unspecified carotid artery: Secondary | ICD-10-CM | POA: Diagnosis not present

## 2021-05-12 DIAGNOSIS — Z7984 Long term (current) use of oral hypoglycemic drugs: Secondary | ICD-10-CM | POA: Insufficient documentation

## 2021-05-12 DIAGNOSIS — Y92009 Unspecified place in unspecified non-institutional (private) residence as the place of occurrence of the external cause: Secondary | ICD-10-CM | POA: Diagnosis not present

## 2021-05-12 DIAGNOSIS — E119 Type 2 diabetes mellitus without complications: Secondary | ICD-10-CM | POA: Insufficient documentation

## 2021-05-12 DIAGNOSIS — W01198A Fall on same level from slipping, tripping and stumbling with subsequent striking against other object, initial encounter: Secondary | ICD-10-CM | POA: Insufficient documentation

## 2021-05-12 DIAGNOSIS — W19XXXA Unspecified fall, initial encounter: Secondary | ICD-10-CM | POA: Diagnosis not present

## 2021-05-12 DIAGNOSIS — M545 Low back pain, unspecified: Secondary | ICD-10-CM | POA: Diagnosis not present

## 2021-05-12 DIAGNOSIS — M47812 Spondylosis without myelopathy or radiculopathy, cervical region: Secondary | ICD-10-CM | POA: Diagnosis not present

## 2021-05-12 DIAGNOSIS — R410 Disorientation, unspecified: Secondary | ICD-10-CM | POA: Diagnosis not present

## 2021-05-12 DIAGNOSIS — Z87891 Personal history of nicotine dependence: Secondary | ICD-10-CM | POA: Insufficient documentation

## 2021-05-12 DIAGNOSIS — Z79899 Other long term (current) drug therapy: Secondary | ICD-10-CM | POA: Insufficient documentation

## 2021-05-12 DIAGNOSIS — I10 Essential (primary) hypertension: Secondary | ICD-10-CM | POA: Insufficient documentation

## 2021-05-12 DIAGNOSIS — E860 Dehydration: Secondary | ICD-10-CM | POA: Diagnosis not present

## 2021-05-12 DIAGNOSIS — R519 Headache, unspecified: Secondary | ICD-10-CM | POA: Diagnosis not present

## 2021-05-12 DIAGNOSIS — R270 Ataxia, unspecified: Secondary | ICD-10-CM | POA: Diagnosis not present

## 2021-05-12 DIAGNOSIS — R739 Hyperglycemia, unspecified: Secondary | ICD-10-CM | POA: Diagnosis not present

## 2021-05-12 DIAGNOSIS — R296 Repeated falls: Secondary | ICD-10-CM | POA: Diagnosis not present

## 2021-05-12 LAB — BASIC METABOLIC PANEL
Anion gap: 9 (ref 5–15)
BUN: 34 mg/dL — ABNORMAL HIGH (ref 8–23)
CO2: 19 mmol/L — ABNORMAL LOW (ref 22–32)
Calcium: 8.3 mg/dL — ABNORMAL LOW (ref 8.9–10.3)
Chloride: 107 mmol/L (ref 98–111)
Creatinine, Ser: 2.11 mg/dL — ABNORMAL HIGH (ref 0.61–1.24)
GFR, Estimated: 30 mL/min — ABNORMAL LOW (ref >60)
Glucose, Bld: 159 mg/dL — ABNORMAL HIGH (ref 70–99)
Potassium: 5.3 mmol/L — ABNORMAL HIGH (ref 3.5–5.1)
Sodium: 135 mmol/L (ref 135–145)

## 2021-05-12 LAB — CBC WITH DIFFERENTIAL/PLATELET
Abs Immature Granulocytes: 0.05 10*3/uL (ref 0.00–0.07)
Basophils Absolute: 0.1 10*3/uL (ref 0.0–0.1)
Basophils Relative: 1 %
Eosinophils Absolute: 0 10*3/uL (ref 0.0–0.5)
Eosinophils Relative: 0 %
HCT: 44.7 % (ref 39.0–52.0)
Hemoglobin: 14.6 g/dL (ref 13.0–17.0)
Immature Granulocytes: 0 %
Lymphocytes Relative: 13 %
Lymphs Abs: 1.6 10*3/uL (ref 0.7–4.0)
MCH: 32 pg (ref 26.0–34.0)
MCHC: 32.7 g/dL (ref 30.0–36.0)
MCV: 98 fL (ref 80.0–100.0)
Monocytes Absolute: 1.1 10*3/uL — ABNORMAL HIGH (ref 0.1–1.0)
Monocytes Relative: 10 %
Neutro Abs: 9 10*3/uL — ABNORMAL HIGH (ref 1.7–7.7)
Neutrophils Relative %: 76 %
Platelets: 212 10*3/uL (ref 150–400)
RBC: 4.56 MIL/uL (ref 4.22–5.81)
RDW: 13.4 % (ref 11.5–15.5)
WBC: 11.8 10*3/uL — ABNORMAL HIGH (ref 4.0–10.5)
nRBC: 0 % (ref 0.0–0.2)

## 2021-05-12 LAB — COMPREHENSIVE METABOLIC PANEL
ALT: 40 U/L (ref 0–44)
AST: 42 U/L — ABNORMAL HIGH (ref 15–41)
Albumin: 4.8 g/dL (ref 3.5–5.0)
Alkaline Phosphatase: 77 U/L (ref 38–126)
Anion gap: 10 (ref 5–15)
BUN: 35 mg/dL — ABNORMAL HIGH (ref 8–23)
CO2: 21 mmol/L — ABNORMAL LOW (ref 22–32)
Calcium: 9.2 mg/dL (ref 8.9–10.3)
Chloride: 101 mmol/L (ref 98–111)
Creatinine, Ser: 2.33 mg/dL — ABNORMAL HIGH (ref 0.61–1.24)
GFR, Estimated: 27 mL/min — ABNORMAL LOW (ref >60)
Glucose, Bld: 202 mg/dL — ABNORMAL HIGH (ref 70–99)
Potassium: 5.9 mmol/L — ABNORMAL HIGH (ref 3.5–5.1)
Sodium: 132 mmol/L — ABNORMAL LOW (ref 135–145)
Total Bilirubin: 0.7 mg/dL (ref 0.3–1.2)
Total Protein: 8.9 g/dL — ABNORMAL HIGH (ref 6.5–8.1)

## 2021-05-12 LAB — TROPONIN I (HIGH SENSITIVITY)
Troponin I (High Sensitivity): 6 ng/L (ref <18)
Troponin I (High Sensitivity): 6 ng/L (ref <18)

## 2021-05-12 MED ORDER — SODIUM CHLORIDE 0.9 % IV BOLUS
1000.0000 mL | Freq: Once | INTRAVENOUS | Status: AC
  Administered 2021-05-12: 1000 mL via INTRAVENOUS

## 2021-05-12 MED ORDER — ACETAMINOPHEN 325 MG PO TABS
650.0000 mg | ORAL_TABLET | Freq: Once | ORAL | Status: AC
  Administered 2021-05-12: 650 mg via ORAL
  Filled 2021-05-12: qty 2

## 2021-05-12 MED ORDER — SODIUM ZIRCONIUM CYCLOSILICATE 5 G PO PACK
5.0000 g | PACK | Freq: Once | ORAL | Status: AC
  Administered 2021-05-12: 5 g via ORAL
  Filled 2021-05-12: qty 1

## 2021-05-12 NOTE — ED Provider Notes (Signed)
Emergency Medicine Provider Triage Evaluation Note  Manuel Atkins , a 87 y.o. male  was evaluated in triage.  He is anticoagulated with Eliquis for prior DVT/PE and has history of type 2 diabetes.  pt complains of gradually worsening generalized weakness and frequent falls.  He reports frequent falls for approximately 6 months.  [place] today at home struck his head on a hardwood floor.  He also states that he injured his lower back and both posterior shoulders during the fall.  He was unable to get up without assistance.  States he felt confused after the fall, but denies loss of consciousness.  EMS was called, but patient did not want transportation to [facility] so arrived by private vehicle.  He complains of "soreness" of his shoulders and lower back.  He denies headache, dizziness, visual changes, numbness or weakness of his lower extremities.  Review of Systems  Positive: Upper back pain, lower back pain, head injury Negative: No upper and lower extremity weakness, urine or bowel changes, or abdominal pain  Physical Exam  BP 140/66 (BP Location: Right Arm)   Pulse 90   Temp 97.8 F (36.6 C) (Oral)   Resp 17   Ht 6' (1.829 m)   Wt 93.4 kg   SpO2 100%   BMI 27.94 kg/m  Gen:   Awake, no distress   Resp:  Normal effort  MSK:   Moves extremities without difficulty  Other:  Tenderness palpation of bilateral upper back, lumbar back.  No scalp hematomas  Medical Decision Making  Medically screening exam initiated at 1:52 PM.  Appropriate orders placed.  Darl Pikes was informed that the remainder of the evaluation will be completed by another provider, this initial triage assessment does not replace that evaluation, and the importance of remaining in the ED until their evaluation is complete.  Patient here for increasing generalized weakness and frequent falls.  Had fall today with head injury.  He is anticoagulated with Eliquis.  He also has lower back and upper back pain.  He will need  further evaluation emergency department.  Patient is agreeable to plan.    Kem Parkinson, PA-C 05/12/21 1358    Fredia Sorrow, MD 05/16/21 [phone]

## 2021-05-12 NOTE — ED Triage Notes (Signed)
Pt to the ED after a fall at home on hardwood floor.  Pt did hit his head and he is on eliquis, and denies LOC.  Pt's wife said he was mildly confused after the fall. Pt is A&O x4 in triage.  Pt complains of back and shoulder pain.

## 2021-05-12 NOTE — Discharge Instructions (Addendum)
You are seen in the emergency department for evaluation of back pain after a fall.  Your lab work showed your potassium was mildly elevated and you were slightly dehydrated.  You had a CAT scan that did not show any obvious fractures in your back.  Please use Tylenol as needed for pain.  Follow-up with your primary care doctor.  Return to the emergency department if any worsening or concerning symptoms
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
## 2021-05-15 DIAGNOSIS — R296 Repeated falls: Secondary | ICD-10-CM | POA: Diagnosis not present

## 2021-05-15 DIAGNOSIS — M6281 Muscle weakness (generalized): Secondary | ICD-10-CM | POA: Diagnosis not present

## 2021-05-17 DIAGNOSIS — R296 Repeated falls: Secondary | ICD-10-CM | POA: Diagnosis not present

## 2021-05-17 DIAGNOSIS — M6281 Muscle weakness (generalized): Secondary | ICD-10-CM | POA: Diagnosis not present

## 2021-05-20 ENCOUNTER — Telehealth: Payer: Self-pay | Admitting: Pulmonary Disease

## 2021-05-20 DIAGNOSIS — M6281 Muscle weakness (generalized): Secondary | ICD-10-CM | POA: Diagnosis not present

## 2021-05-20 DIAGNOSIS — R296 Repeated falls: Secondary | ICD-10-CM | POA: Diagnosis not present

## 2021-05-20 NOTE — Telephone Encounter (Signed)
Or they may be reached at [phone]

## 2021-05-20 NOTE — Telephone Encounter (Signed)
Pts wife stated that he has been having difficulty breathing and stated that he is on his Bipap machine all the time and stated that he is really getting worse and stated that he fell last week and was at the ER and stated that it makes him so weak to the point he cant catch his breath and he falls and it makes her very concerned.    Pls regard; [phone]

## 2021-05-20 NOTE — Telephone Encounter (Signed)
Called and spoke with patient's wife. She stated that his SOB has increased greatly over the past week. He had a fall last week on 05/12/21 and she took him to the ED since he hit his head. He has been using his bipap 24/7 for relief. He had seemed to improvement once he went home but the SOB came back on Wednesday. He has not had any fevers, body aches or chills. They have not been around anyone who has been sick recently. She was requesting an appt to be seen.   I was able to get him scheduled to see Beth on 05/23/21 at 930am.   Nothing further needed at time of call.

## 2021-05-22 DIAGNOSIS — L110 Acquired keratosis follicularis: Secondary | ICD-10-CM | POA: Diagnosis not present

## 2021-05-22 DIAGNOSIS — E1140 Type 2 diabetes mellitus with diabetic neuropathy, unspecified: Secondary | ICD-10-CM | POA: Diagnosis not present

## 2021-05-22 DIAGNOSIS — B351 Tinea unguium: Secondary | ICD-10-CM | POA: Diagnosis not present

## 2021-05-23 ENCOUNTER — Ambulatory Visit: Payer: Medicare Other | Admitting: Primary Care

## 2021-05-23 ENCOUNTER — Other Ambulatory Visit: Payer: Self-pay

## 2021-05-23 ENCOUNTER — Ambulatory Visit (INDEPENDENT_AMBULATORY_CARE_PROVIDER_SITE_OTHER): Payer: Medicare Other

## 2021-05-23 ENCOUNTER — Encounter (INDEPENDENT_AMBULATORY_CARE_PROVIDER_SITE_OTHER): Payer: Self-pay

## 2021-05-23 VITALS — BP 138/70 | HR 86 | Ht 71.0 in | Wt 199.6 lb

## 2021-05-23 DIAGNOSIS — R0602 Shortness of breath: Secondary | ICD-10-CM

## 2021-05-23 DIAGNOSIS — J849 Interstitial pulmonary disease, unspecified: Secondary | ICD-10-CM | POA: Diagnosis not present

## 2021-05-23 LAB — D-DIMER, QUANTITATIVE: D-Dimer, Quant: <0.19 mcg/mL FEU (ref <0.50)

## 2021-05-23 LAB — BRAIN NATRIURETIC PEPTIDE: Pro B Natriuretic peptide (BNP): 26 pg/mL (ref 0.0–100.0)

## 2021-05-23 MED ORDER — PREDNISONE 10 MG PO TABS: ORAL_TABLET | ORAL | 0 refills | Status: DC

## 2021-05-23 NOTE — Progress Notes (Signed)
D-dimer was normal

## 2021-05-23 NOTE — Patient Instructions (Signed)
Nice seeing you today Manuel Atkins  We need to do some testing today to rule out other causes of shortness of breath,however, this is likely a flare up or progression of ILD. After results are back we may consider short course of oral prednisone to help with dyspnea symptoms   Recommendations: - Follow up with PCP regarding falls - Continue physical therapy  Orders: - CXR and labs today  Follow-up: - First available in August with Dr. Vaughan Browner - 15 min ok

## 2021-05-23 NOTE — Progress Notes (Signed)
Please let patient know CXR was normal, no evidence of pneumonia or acute bronchitis. We will likely have labs back later today or tomorrow.

## 2021-05-23 NOTE — Progress Notes (Signed)
@Patient  ID: Manuel Atkins, male    DOB: [birth year], 87 y.o.   MRN: [id number]  Chief Complaint  Patient presents with   Follow-up    Reports decreased appetite and weight loss. Reports more SOB that his baseline for about 2 weeks. Wife reports he is having to stop a lot while ambulating. Reports a dry cough. Denies chest pain but states his chest feels tight.     Referring provider: [facility], [facility]  HPI: 87 year old male, former smoker quit in January 1970.  Past medical history significant for ILD, obstructive sleep apnea, hypertension, GERD, esophageal stricture, ulcerative colitis, type 2 diabetes, striae of PE, hyperlipidemia, thrombocytopenia.  Patient of Dr. Vaughan Browner, last seen in office on 01/31/2021.   Hospitalized in early January 2019 at [facility] for dyspnea on exertion, hypoxia.  He had been evaluated with echo showing normal EF, proBNP was normal, d-dimer was normal.  He was started on Lasix without improvement.  He had a CT scan which showed mild bronchiectasis with basal interstitial lung disease.  He was started on 20 mg of prednisone and referred to pulmonary.     He was seen by pulmonary at [facility] but wants to transition care to [facility]He follows with Dr. Laural Golden for ulcerative colitis and is on infliximab and mesalamine.  GERD is complicated by esophageal stricture status post dilation 4 years ago.  Symptoms are stable on PPI.  Elevated transaminases are thought to be secondary to fatty liver.  He still has occasional dysphagia and choking on food. Seen by Dr. Laural Golden and underwent dilation of esophageal stricture on 12/15/17.  Heartburn symptoms are controlled on therapy.   He is had a work-up by Dr. Virgina Jock, cardiology with right and left heart cath showing nonobstructive coronary artery disease, no evidence of pulmonary hypertension. Finished pulmonary rehab in 2019   Diagnosed with segmental pulmonary embolism in November 2020 and currently on Eliquis  anticoagulation.   He has discussed with his GI doctor and taken off infliximab in January 2021      Previous [facility] pulmonary encounter: 01/31/21 Continues to have dyspnea on exertion.  States that it may be slightly worse compared to before. No new complaints Using the BiPAP and is doing well with it.   Had an overnight oximetry done several months ago but apparently there is a problem with completion of the study.  No records available of this test in epic  05/23/2021- interim Patient presents today for acute visit. Hx bronchiectasis/ILD. Dr. Vaughan Browner feels there is no clear evidence that he has IPF, possible pneumonitis from UC or from infliximab, meslamine or chronic aspiration. No evidence for pulmonary HTN or significant coronary artery disease. For definite diagnosis of ILD and treatment he would need an open lung biopsy, risk and benefits of biopsy versus empirically treating him with antifibrotics were discussed but he would like to defer all biopsies. Overall he is felt to be relatively stable and will continue to manage conservatively. Needs repeat HRCT and PFTs in November 2022.   Patient reports increased shortness of breath for the last 1-2 weeks. Associated dry cough, weakness and fatigue. He has had some recent falls, no injuries. He ambulates with walker. He is using BIPAP at night. Denies chest tightness, wheezing or congestion.    Exposure hx:  Pets: Has dogs.  Exposed to farm animals in childhood.  No birds Occupation: Retired Art gallery manager.  Used to work in [employer]. Exposures: May have been exposed to asbestos.  Reports exposure to  cotton dust.  He has a hobby of woodworking and is exposed to wood dust.  No mold, hot tubs. jacuzzi. Smoking history: 10-pack-year smoking history.  Quit in 1980 Travel History: Not significant  Data Reviewed: CT scan 07/27/2008-mild basal atelectasis right upper lobe pulmonary embolism CT scan 11/16/17-mild bronchiectasis, basal  reticulation right greater than left.  Borderline right hilar lymphadenopathy.  Nodular liver contour possible cirrhosis High-resolution CT 01/26/18- patchy ground glass attenuation, mild bronchiectasis, septal thickening with no basal gradient.  No honeycombing. Indeterminate for UIP.  Hepatic steatosis, aortic atherosclerosis, left main and left anterior coronary artery disease. High-resolution CT 05/04/2018-mild basal predominant lung fibrosis stable from prior exam. High-resolution CT 10/18/2018-stable interstitial lung disease CTA 09/26/2019-focal filling defect within a subsegmental branch of the right lower lobe pulmonary artery.  Equivocal for PE High-res CT 08/21/2020-stable interstitial lung disease I have reviewed the images personally.   Barium swallow 05/31/13- mild impairment of esophageal motility, stricture at GE junction with obstruction of barium tablet.   Labs Connective tissue serologies 11/23/17-ANA, ACE, CCP, rheumatoid factor all negative Hypersenitivity panel-negative   PFTs  12/23/17 FVC 2.30 (54%), FEV1 1.97 [66%], F/F 86, TLC 64%, DLCO 42%   05/18/2018 FVC 2.32 [55%), FEV1 1.91 [65%], F/F 82, DLCO 43%   10/20/2018 FVC 2.30 [55%], FEV1 1.96 [6 6%], F/F 85, DLCO 46%   10/24/2019 FVC 2.29 [5%], FEV1 2.03 [70%],/89, TLC 3.63 [49%], DLCO 13.57 [54%],   10/01/2020 FVC 2.28 [56%], FEV1 1.99 [69%], TLC 4.75 (64%), DLCO 15.21 [61%] Severe restriction, moderate diffusion defect   6-minute walk 03/03/18- 338 m 6-minute walk 05/20/18- 288 m 6-minute walk 10/27/18- 12 m   Sleep Received copy of sleep study from [facility] pulmonary, 08/01/2011 BiPAP titration.  Titrated to IPAP 17, EPAP 13.  Severe PLM's noted.  Consider treatment for restless leg syndrome.   Overnight oximetry 02/17/2020 overnight oximetry on room air on BiPAP (IPAP 17 cm, EPAP setting 13 cm)-duration of sleep 8 hours and 31 minutes, time spent below 88% 8 seconds.  Did not qualify for nocturnal oxygen  use  Allergies  Allergen Reactions   Mercaptopurine Other (See Comments)    High Fever, Chills, Fatigue    Immunization History  Administered Date(s) Administered   Fluad Quad(high Dose 65+) 08/31/2020   Influenza, High Dose Seasonal PF 07/19/2018, 08/10/2019   Influenza-Unspecified 10/09/2017   Moderna Sars-Covid-2 Vaccination 12/03/2019, 01/02/2020, 09/25/2020   PPD Test 11/01/2012   Pneumococcal Polysaccharide-23 11/11/2007   Tdap 11/11/2007    Past Medical History:  Diagnosis Date   CMV colitis ([place]) 11/08/2012   Dyspnea    Essential hypertension, benign    GERD (gastroesophageal reflux disease)    Headache(784.0)    History of colon polyps    History of transfusion of whole blood    Interstitial lung disease ([place]) 12/01/2017   Mixed hyperlipidemia    Obstructive sleep apnea (adult) (pediatric)    uses bipap @ HS   Other malaise and fatigue    Thrombocytopenia ([place]) 11/10/2012   Type II or unspecified type diabetes mellitus without mention of complication, uncontrolled    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HCC)     Tobacco History: Social History   Tobacco Use  Smoking Status Former   Types: Pipe, Cigars   Quit date: 11/10/1968   Years since quitting: 52.5  Smokeless Tobacco Never   Counseling given: Not Answered   Outpatient Medications Prior to Visit  Medication Sig Dispense Refill   acetaminophen (TYLENOL) 500 MG tablet Take 1,000 mg by mouth  every 6 (six) hours as needed for moderate pain or headache.     ALPRAZolam (XANAX) 0.25 MG tablet Take 0.5 tablets (0.125 mg total) by mouth 3 (three) times daily as needed for anxiety. for anxiety 135 tablet 1   diphenhydrAMINE (BENADRYL) 50 MG capsule Take 50 mg by mouth daily.     ELIQUIS 5 MG TABS tablet TAKE ONE TABLET BY MOUTH TWICE DAILY (Patient taking differently: Take 5 mg by mouth 2 (two) times daily.) 60 tablet 5   glipiZIDE (GLUCOTROL XL) 5 MG 24 hr tablet Take 10 mg by mouth daily with breakfast.      imipramine (TOFRANIL) 25 MG tablet Take 75 mg by mouth at bedtime.  0   latanoprost (XALATAN) 0.005 % ophthalmic solution Place 1 drop into both eyes at bedtime.  6   losartan (COZAAR) 100 MG tablet Take 100 mg by mouth daily.     metFORMIN (GLUCOPHAGE-XR) 500 MG 24 hr tablet Take 500-1,000 mg by mouth See admin instructions. Patient takes 500 mg in the morning and 1000 mg at night.  1   pantoprazole (PROTONIX) 40 MG tablet TAKE ONE TABLET BY MOUTH EVERY *OTHER* DAY. (Patient taking differently: Take 40 mg by mouth every other day.) 45 tablet 3   Probiotic Product (PROBIOTIC DAILY PO) Take 1 capsule by mouth daily.      rosuvastatin (CRESTOR) 10 MG tablet Take 10 mg by mouth every evening.      tamsulosin (FLOMAX) 0.4 MG CAPS capsule Take 0.4 mg by mouth at bedtime.     No facility-administered medications prior to visit.      Review of Systems  Review of Systems  Constitutional: Negative.        Weight loss  HENT:  Negative for congestion.   Respiratory:  Positive for cough and shortness of breath. Negative for chest tightness and wheezing.   Cardiovascular: Negative.     Physical Exam  BP 138/70 (BP Location: Right Arm, Patient Position: Sitting, Cuff Size: Normal)   Pulse 86   Ht 5' 11"  (1.803 m)   Wt 199 lb 9.6 oz (90.5 kg)   SpO2 98%   BMI 27.84 kg/m  Physical Exam Constitutional:      Appearance: Normal appearance.  HENT:     Head: Normocephalic and atraumatic.     Mouth/Throat:     Mouth: Mucous membranes are moist.     Pharynx: Oropharynx is clear.     Comments: Deferred d/t masking Cardiovascular:     Rate and Rhythm: Normal rate and regular rhythm.     Comments: Regular rate and rhythm; no LE swelling Pulmonary:     Effort: Pulmonary effort is normal. No respiratory distress.     Breath sounds: Normal breath sounds.  Musculoskeletal:        General: Normal range of motion.  Neurological:     General: No focal deficit present.     Mental Status: He is  alert and oriented to person, place, and time. Mental status is at baseline.  Psychiatric:        Mood and Affect: Mood normal.        Behavior: Behavior normal.        Thought Content: Thought content normal.        Judgment: Judgment normal.     Lab Results:  CBC    Component Value Date/Time   WBC 11.8 (H) 05/12/2021 1451   RBC 4.56 05/12/2021 1451   HGB 14.6 05/12/2021 1451   HCT  44.7 05/12/2021 1451   PLT 212 05/12/2021 1451   MCV 98.0 05/12/2021 1451   MCH 32.0 05/12/2021 1451   MCHC 32.7 05/12/2021 1451   RDW 13.4 05/12/2021 1451   LYMPHSABS 1.6 05/12/2021 1451   MONOABS 1.1 (H) 05/12/2021 1451   EOSABS 0.0 05/12/2021 1451   BASOSABS 0.1 05/12/2021 1451    BMET    Component Value Date/Time   NA 135 05/12/2021 2137   K 5.3 (H) 05/12/2021 2137   CL 107 05/12/2021 2137   CO2 19 (L) 05/12/2021 2137   GLUCOSE 159 (H) 05/12/2021 2137   BUN 34 (H) 05/12/2021 2137   CREATININE 2.11 (H) 05/12/2021 2137   CREATININE 1.65 (H) 09/26/2019 1646   CALCIUM 8.3 (L) 05/12/2021 2137   CALCIUM 9.6 11/15/2012 1425   GFRNONAA 30 (L) 05/12/2021 2137   GFRAA 43 (L) 09/27/2019 0504    BNP    Component Value Date/Time   BNP 23 09/26/2019 1646    ProBNP    Component Value Date/Time   PROBNP 26.0 05/23/2021 1001    Imaging: DG Chest 2 View  Result Date: 05/23/2021 CLINICAL DATA:  87 year old male with shortness of breath for 1 week. Interstitial lung disease. EXAM: CHEST - 2 VIEW COMPARISON:  High-resolution chest CT 08/21/2020 and earlier. FINDINGS: Low normal lung volumes, stable since 2020. Mediastinal contours remain normal. Visualized tracheal air column is within normal limits. No pneumothorax, pulmonary edema, pleural effusion or confluent pulmonary opacity. No acute osseous abnormality identified. Negative visible bowel gas pattern. Stable cholecystectomy clips. IMPRESSION: No acute cardiopulmonary abnormality. Electronically Signed   By: Genevie Ann M.D.   On: 05/23/2021  10:31   CT Head Wo Contrast  Result Date: 05/12/2021 CLINICAL DATA:  87 year old male with multiple falls and ataxia. EXAM: CT HEAD WITHOUT CONTRAST CT CERVICAL SPINE WITHOUT CONTRAST TECHNIQUE: Multidetector CT imaging of the head and cervical spine was performed following the standard protocol without intravenous contrast. Multiplanar CT image reconstructions of the cervical spine were also generated. COMPARISON:  None. FINDINGS: CT HEAD FINDINGS Brain: No evidence of acute infarction, hemorrhage, hydrocephalus, extra-axial collection or mass lesion/mass effect. Atrophy and chronic small-vessel white matter ischemic changes are noted. Vascular: Carotid atherosclerotic calcifications are noted. Skull: No acute abnormality Sinuses/Orbits: No acute abnormality Other: None. CT CERVICAL SPINE FINDINGS Alignment: Normal. Skull base and vertebrae: No acute fracture. No primary bone lesion or focal pathologic process. Soft tissues and spinal canal: No prevertebral fluid or swelling. No visible canal hematoma. Disc levels: Multilevel degenerative disc disease/spondylosis and facet arthropathy noted contributing to mild central spinal and bony foraminal narrowing at multiple levels. Upper chest: No acute abnormality Other: None IMPRESSION: 1. No evidence of acute intracranial abnormality. Atrophy and chronic small-vessel white matter ischemic changes. 2. No static evidence of acute injury to the cervical spine. Degenerative changes as described. Electronically Signed   By: Margarette Canada M.D.   On: 05/12/2021 14:44   CT Cervical Spine Wo Contrast  Result Date: 05/12/2021 CLINICAL DATA:  87 year old male with multiple falls and ataxia. EXAM: CT HEAD WITHOUT CONTRAST CT CERVICAL SPINE WITHOUT CONTRAST TECHNIQUE: Multidetector CT imaging of the head and cervical spine was performed following the standard protocol without intravenous contrast. Multiplanar CT image reconstructions of the cervical spine were also generated.  COMPARISON:  None. FINDINGS: CT HEAD FINDINGS Brain: No evidence of acute infarction, hemorrhage, hydrocephalus, extra-axial collection or mass lesion/mass effect. Atrophy and chronic small-vessel white matter ischemic changes are noted. Vascular: Carotid atherosclerotic calcifications are noted. Skull:  No acute abnormality Sinuses/Orbits: No acute abnormality Other: None. CT CERVICAL SPINE FINDINGS Alignment: Normal. Skull base and vertebrae: No acute fracture. No primary bone lesion or focal pathologic process. Soft tissues and spinal canal: No prevertebral fluid or swelling. No visible canal hematoma. Disc levels: Multilevel degenerative disc disease/spondylosis and facet arthropathy noted contributing to mild central spinal and bony foraminal narrowing at multiple levels. Upper chest: No acute abnormality Other: None IMPRESSION: 1. No evidence of acute intracranial abnormality. Atrophy and chronic small-vessel white matter ischemic changes. 2. No static evidence of acute injury to the cervical spine. Degenerative changes as described. Electronically Signed   By: Margarette Canada M.D.   On: 05/12/2021 14:44   CT Lumbar Spine Wo Contrast  Result Date: 05/12/2021 CLINICAL DATA:  Low back pain.  Multiple falls in the last 6 months. EXAM: CT LUMBAR SPINE WITHOUT CONTRAST TECHNIQUE: Multidetector CT imaging of the lumbar spine was performed without intravenous contrast administration. Multiplanar CT image reconstructions were also generated. COMPARISON:  None. FINDINGS: Segmentation: The lowest lumbar type non-rib-bearing vertebra is labeled as L5. Alignment: No vertebral subluxation is observed. Vertebrae: Preserved intervertebral disc height. No lumbar spine fracture or acute bony findings observed. Bridging spurring of both sacroiliac joints. Chronic irregularity of the left posterior iliac bone, possibly from prior graft harvesting or injury. Paraspinal and other soft tissues: Aortoiliac atherosclerotic vascular  disease. Disc levels: T12-L1: Unremarkable. L1-2: Unremarkable. L2-3: Mild left and borderline right foraminal stenosis due to facet arthropathy, intervertebral spurring, and mild disc bulge. L3-4: No impingement. Mild disc bulge with mild facet and intervertebral spurring. L4-5: Borderline left foraminal stenosis due to facet and intervertebral spurring. Mild disc bulge. L5-S1: No impingement. Mild left facet spurring and mild disc bulge. IMPRESSION: 1. Mild lumbar spondylosis and degenerative disc disease, causing mild impingement at L2-3 and borderline impingement at L4-5. 2. Chronic bridging spurring of the sacroiliac joints. Slight deformity of the left posterior iliac bone, possibly from remote trauma or graft harvesting. 3.  Aortic Atherosclerosis (ICD10-I70.0). Electronically Signed   By: Van Clines M.D.   On: 05/12/2021 14:53     Assessment & Plan:   ILD (interstitial lung disease) ([place]) - Patient reports increased shortness of breath over the last 1-2 weeks, progressively worse over the last several months. CXR, d-dimer and BNP were normal. He had rales on exam to his lung bases. O2 98% RA. Symptoms felt to be related to acute flare versus progression of his underlying ILD. Recommend prednisone taper (53m x 3 days, 372mx 3 days, 2018m 3 days, 48m37m3 days). Needs follow-up with Dr. MannVaughan BrownerAugust to re-assess plan as his symptoms appear worse, he is due for repeat imaging and PFs in November.       ElizMartyn Ehrich 05/23/2021

## 2021-05-23 NOTE — Progress Notes (Signed)
I send previous message with results. Everything has come back. CXR, D-dimer and BNP were normal I will send in prednisone taper for him to see if this helps with his dyspnea

## 2021-05-23 NOTE — Assessment & Plan Note (Addendum)
-   Patient reports increased shortness of breath over the last 1-2 weeks, progressively worse over the last several months. CXR, d-dimer and BNP were normal. He had rales on exam to his lung bases. O2 98% RA. Symptoms felt to be related to acute flare versus progression of his underlying ILD. Recommend prednisone taper (87m x 3 days, 345mx 3 days, 2063m 3 days, 19m50m3 days). Needs follow-up with Dr. MannVaughan BrownerAugust to re-assess plan as his symptoms appear worse, he is due for repeat imaging and PFs in November.

## 2021-05-27 DIAGNOSIS — R296 Repeated falls: Secondary | ICD-10-CM | POA: Diagnosis not present

## 2021-05-27 DIAGNOSIS — M6281 Muscle weakness (generalized): Secondary | ICD-10-CM | POA: Diagnosis not present

## 2021-05-31 DIAGNOSIS — M6281 Muscle weakness (generalized): Secondary | ICD-10-CM | POA: Diagnosis not present

## 2021-05-31 DIAGNOSIS — R296 Repeated falls: Secondary | ICD-10-CM | POA: Diagnosis not present

## 2021-06-03 DIAGNOSIS — R296 Repeated falls: Secondary | ICD-10-CM | POA: Diagnosis not present

## 2021-06-03 DIAGNOSIS — M6281 Muscle weakness (generalized): Secondary | ICD-10-CM | POA: Diagnosis not present

## 2021-06-06 DIAGNOSIS — R296 Repeated falls: Secondary | ICD-10-CM | POA: Diagnosis not present

## 2021-06-06 DIAGNOSIS — M6281 Muscle weakness (generalized): Secondary | ICD-10-CM | POA: Diagnosis not present

## 2021-06-12 DIAGNOSIS — R296 Repeated falls: Secondary | ICD-10-CM | POA: Diagnosis not present

## 2021-06-12 DIAGNOSIS — M6281 Muscle weakness (generalized): Secondary | ICD-10-CM | POA: Diagnosis not present

## 2021-06-13 DIAGNOSIS — G4733 Obstructive sleep apnea (adult) (pediatric): Secondary | ICD-10-CM | POA: Diagnosis not present

## 2021-06-14 DIAGNOSIS — M6281 Muscle weakness (generalized): Secondary | ICD-10-CM | POA: Diagnosis not present

## 2021-06-14 DIAGNOSIS — R296 Repeated falls: Secondary | ICD-10-CM | POA: Diagnosis not present

## 2021-06-18 DIAGNOSIS — M6281 Muscle weakness (generalized): Secondary | ICD-10-CM | POA: Diagnosis not present

## 2021-06-18 DIAGNOSIS — R296 Repeated falls: Secondary | ICD-10-CM | POA: Diagnosis not present

## 2021-06-21 DIAGNOSIS — J029 Acute pharyngitis, unspecified: Secondary | ICD-10-CM | POA: Diagnosis not present

## 2021-06-25 DIAGNOSIS — R296 Repeated falls: Secondary | ICD-10-CM | POA: Diagnosis not present

## 2021-06-25 DIAGNOSIS — M6281 Muscle weakness (generalized): Secondary | ICD-10-CM | POA: Diagnosis not present

## 2021-06-28 ENCOUNTER — Ambulatory Visit: Payer: Medicare Other | Admitting: Pulmonary Disease

## 2021-06-28 ENCOUNTER — Other Ambulatory Visit: Payer: Self-pay

## 2021-06-28 ENCOUNTER — Encounter: Payer: Self-pay | Admitting: Pulmonary Disease

## 2021-06-28 VITALS — BP 136/78 | HR 94 | Temp 97.6°F | Ht 71.0 in | Wt 204.4 lb

## 2021-06-28 DIAGNOSIS — J849 Interstitial pulmonary disease, unspecified: Secondary | ICD-10-CM | POA: Diagnosis not present

## 2021-06-28 LAB — BASIC METABOLIC PANEL
BUN: 31 mg/dL — ABNORMAL HIGH (ref 6–23)
CO2: 26 mEq/L (ref 19–32)
Calcium: 9.3 mg/dL (ref 8.4–10.5)
Chloride: 99 mEq/L (ref 96–112)
Creatinine, Ser: 1.61 mg/dL — ABNORMAL HIGH (ref 0.40–1.50)
GFR: 38.61 mL/min — ABNORMAL LOW (ref >60.00)
Glucose, Bld: 267 mg/dL — ABNORMAL HIGH (ref 70–99)
Potassium: 4.2 mEq/L (ref 3.5–5.1)
Sodium: 135 mEq/L (ref 135–145)

## 2021-06-28 NOTE — Addendum Note (Signed)
Addended by: Suzzanne Cloud E on: 06/28/2021 10:21 AM   Modules accepted: Orders

## 2021-06-28 NOTE — Progress Notes (Signed)
Manuel Atkins    [id number]    07-31-1933  Primary Care Physician:[facility]  Referring Physician: [facility] [address] [place],   [address]  Chief complaint: Follow up for interstitial lung disease.   HPI: 87 year old with history of OSA, ulcerative colitis, GERD, elevated transaminitis.  Hospitalized in early January 2019 at [facility] for dyspnea on exertion, hypoxia.  He had been evaluated with echo showing normal EF, proBNP was normal, d-dimer was normal.  He was started on Lasix without improvement.  He had a CT scan which showed mild bronchiectasis with basal interstitial lung disease.  He was started on 20 mg of prednisone and referred to pulmonary.    He was seen by pulmonary at [facility] but wants to transition care to [facility]. He follows with Dr. Laural Golden for ulcerative colitis and is on infliximab and mesalamine.  GERD is complicated by esophageal stricture status post dilation 4 years ago.  Symptoms are stable on PPI.  Elevated transaminases are thought to be secondary to fatty liver.  He still has occasional dysphagia and choking on food. Seen by Dr. Laural Golden and underwent dilation of esophageal stricture on 12/15/17.  Heartburn symptoms are controlled on therapy.  He is had a work-up by Dr. Virgina Jock, cardiology with right and left heart cath showing nonobstructive coronary artery disease, no evidence of pulmonary hypertension. Finished pulmonary rehab in 2019  Diagnosed with segmental pulmonary embolism in November 2020 and currently on Eliquis anticoagulation.  He has discussed with his GI doctor and taken off infliximab in January 2021   Pets: Has dogs.  Exposed to farm animals in childhood.  No birds Occupation: Retired Art gallery manager.  Used to work in [employer]. Exposures: May have been exposed to asbestos.  Reports exposure to cotton dust.  He has a hobby of woodworking and is exposed to wood dust.  No mold, hot  tubs. jacuzzi. Smoking history: 10-pack-year smoking history.  Quit in 1980  Travel History: Not significant  Interim history: Is stable with regard to breathing.  He has chronic dyspnea on exertion without change Evaluated in the ED at [facility] in July 2022 after a fall.  CT head and spine were unremarkable.  It was noted that he had high potassium, AKI and was given fluids, Lokelma.  He was advised to get a follow-up labs at his primary care but has not been completed yet  Seen by nurse practitioner in pulmonary clinic on 7/14 with chest x-ray which did not show any acute process  Outpatient Encounter Medications as of 06/28/2021  Medication Sig   acetaminophen (TYLENOL) 500 MG tablet Take 1,000 mg by mouth every 6 (six) hours as needed for moderate pain or headache.   ALPRAZolam (XANAX) 0.25 MG tablet Take 0.5 tablets (0.125 mg total) by mouth 3 (three) times daily as needed for anxiety. for anxiety   diphenhydrAMINE (BENADRYL) 50 MG capsule Take 50 mg by mouth daily.   ELIQUIS 5 MG TABS tablet TAKE ONE TABLET BY MOUTH TWICE DAILY (Patient taking differently: Take 5 mg by mouth 2 (two) times daily.)   glipiZIDE (GLUCOTROL XL) 5 MG 24 hr tablet Take 10 mg by mouth daily with breakfast.   imipramine (TOFRANIL) 25 MG tablet Take 75 mg by mouth at bedtime.   latanoprost (XALATAN) 0.005 % ophthalmic solution Place 1 drop into both eyes at bedtime.   losartan (COZAAR) 100 MG tablet Take 100 mg by mouth daily.  metFORMIN (GLUCOPHAGE-XR) 500 MG 24 hr tablet Take 500-1,000 mg by mouth See admin instructions. Patient takes 500 mg in the morning and 1000 mg at night.   pantoprazole (PROTONIX) 40 MG tablet TAKE ONE TABLET BY MOUTH EVERY *OTHER* DAY. (Patient taking differently: Take 40 mg by mouth every other day.)   Probiotic Product (PROBIOTIC DAILY PO) Take 1 capsule by mouth daily.    rosuvastatin (CRESTOR) 10 MG tablet Take 10 mg by mouth every evening.    tamsulosin (FLOMAX) 0.4 MG CAPS  capsule Take 0.4 mg by mouth at bedtime.   [DISCONTINUED] predniSONE (DELTASONE) 10 MG tablet Take 4 tabs po daily x 3 days; then 3 tabs daily x3 days; then 2 tabs daily x3 days; then 1 tab daily x 3 days; then stop   No facility-administered encounter medications on file as of 06/28/2021.   Physical Exam: Blood pressure 130/72, pulse 72, temperature (!) 96.1 F (35.6 C), temperature source Oral, height 5' 11.5" (1.816 m), weight 207 lb (93.9 kg), SpO2 98 %. Gen:      No acute distress HEENT:  EOMI, sclera anicteric Neck:     No masses; no thyromegaly Lungs:    Minimal bibasilar crackles CV:         Regular rate and rhythm; no murmurs Abd:      + bowel sounds; soft, non-tender; no palpable masses, no distension Ext:    No edema; adequate peripheral perfusion Skin:      Warm and dry; no rash Neuro: alert and oriented x 3 Psych: normal mood and affect  Data Reviewed: CT scan 07/27/2008-mild basal atelectasis right upper lobe pulmonary embolism CT scan 11/16/17-mild bronchiectasis, basal reticulation right greater than left.  Borderline right hilar lymphadenopathy.  Nodular liver contour possible cirrhosis High-resolution CT 01/26/18- patchy ground glass attenuation, mild bronchiectasis, septal thickening with no basal gradient.  No honeycombing. Indeterminate for UIP.  Hepatic steatosis, aortic atherosclerosis, left main and left anterior coronary artery disease. High-resolution CT 05/04/2018-mild basal predominant lung fibrosis stable from prior exam. High-resolution CT 10/18/2018-stable interstitial lung disease CTA 09/26/2019-focal filling defect within a subsegmental branch of the right lower lobe pulmonary artery.  Equivocal for PE High-res CT 08/21/2020-stable interstitial lung disease Chest x-ray 05/23/2021-no active cardiopulmonary process I have reviewed the images personally.  Barium swallow 05/31/13- mild impairment of esophageal motility, stricture at GE junction with obstruction of  barium tablet.  Labs Connective tissue serologies 11/23/17-ANA, ACE, CCP, rheumatoid factor all negative Hypersenitivity panel-negative  PFTs  12/23/17 FVC 2.30 (54%), FEV1 1.97 [66%], F/F 86, TLC 64%, DLCO 42%  05/18/2018 FVC 2.32 [55%), FEV1 1.91 [65%], F/F 82, DLCO 43%  10/20/2018 FVC 2.30 [55%], FEV1 1.96 [6 6%], F/F 85, DLCO 46%  10/24/2019 FVC 2.29 [5%], FEV1 2.03 [70%],/89, TLC 3.63 [49%], DLCO 13.57 [54%],  10/01/2020 FVC 2.28 [56%], FEV1 1.99 [69%], TLC 4.75 (64%), DLCO 15.21 [61%] Severe restriction, moderate diffusion defect  6-minute walk 03/03/18- 338 m 6-minute walk 05/20/18- 288 m 6-minute walk 10/27/18- 363 m  Cardiac RHC 04/20/18 RA: 9 mmHg RV: 29/7 mmHg PA: 29/12 mmHg, mean PAP 19 mmHg PCWP: 9 mmHg LVEDP: 14 mmHg  CO: 4.8 L/min CI: 2.3 L/min/m2  Sleep Received copy of sleep study from [place] [facility], 08/01/2011 BiPAP titration.  Titrated to IPAP 17, EPAP 13.  Severe PLM's noted.  Consider treatment for restless leg syndrome.  Overnight oximetry 02/17/2020 overnight oximetry on room air on BiPAP (IPAP 17 cm, EPAP setting 13 cm)-duration of sleep 8 hours and 31 minutes,  time spent below 88% 8 seconds.  Did not qualify for nocturnal oxygen use  Compliance report 06/29/2021 On BiPAP 16/12 97% compliant, residual AHI 4.5   Assessment:  Follow-up for bronchiectasis, interstitial lung disease. He could have interstitial lung disease from occupational, recreational exposure to cotton dust, wood dust, possible asbestos.  Other considerations include pneumonitis from ulcerative colitis or from infliximab, meslamine therapy or chronic aspiration from esophageal stricture.  Connective tissue serologies are negative. There is no clear evidence that he has IPF.   Cardiac work-up as noted above with no evidence of significant coronary artery disease or pulmonary hypertension. Cardio pulmonary exercise test shows dyspnea likely from combination of cardiac, pulmonary  factors. For definite diagnosis of ILD and treatment he will need an open lung biopsy. We had an extensive discussion about risks benefits of the biopsy versus empirically treating him with anti-fibrotics Discussed the option of Envisia test through bronchoscope but he would like to defer all biopsies  Overall he is remained stable over the past couple of years and hence we are continuing conservative management Repeat high-res CT and PFTs November 2022  Pulmonary embolism Findings are equivocal with nonspecific filling defects. He is currently on Eliquis Would continue anticoagulation given his underlying lung issues and significant dyspnea  OSA Continues on BiPAP Overnight oximetry in 2021 reviewed with no need for supplemental oxygen Download today shows good compliance and response  Recent fall Abnormal labs with hyperkalemia, AKI He is getting physical therapy now.  He has follow-up with primary care 1 month away In the meantime I will order a repeat BMP to make sure his elevated potassium and creatinine are improved  Plan/Recommendations: - High-res CT, PFTs - Continue BiPAP - BMP today  Marshell Garfinkel MD [facility] 06/28/2021, 10:06 AM  CC: [facility]*

## 2021-06-28 NOTE — Patient Instructions (Signed)
We will check basic metabolic panel today to make sure that your labs after the ED visit are okay Order high-res CT and PFTs for follow-up of interstitial lung disease in the next 3 to 4 months Follow-up in clinic after these tests.

## 2021-07-02 DIAGNOSIS — M6281 Muscle weakness (generalized): Secondary | ICD-10-CM | POA: Diagnosis not present

## 2021-07-02 DIAGNOSIS — R296 Repeated falls: Secondary | ICD-10-CM | POA: Diagnosis not present

## 2021-07-05 DIAGNOSIS — R296 Repeated falls: Secondary | ICD-10-CM | POA: Diagnosis not present

## 2021-07-05 DIAGNOSIS — M6281 Muscle weakness (generalized): Secondary | ICD-10-CM | POA: Diagnosis not present

## 2021-07-08 DIAGNOSIS — M6281 Muscle weakness (generalized): Secondary | ICD-10-CM | POA: Diagnosis not present

## 2021-07-08 DIAGNOSIS — R296 Repeated falls: Secondary | ICD-10-CM | POA: Diagnosis not present

## 2021-07-09 ENCOUNTER — Other Ambulatory Visit: Payer: Self-pay | Admitting: Pulmonary Disease

## 2021-07-11 DIAGNOSIS — Z9181 History of falling: Secondary | ICD-10-CM | POA: Diagnosis not present

## 2021-07-12 DIAGNOSIS — R296 Repeated falls: Secondary | ICD-10-CM | POA: Diagnosis not present

## 2021-07-12 DIAGNOSIS — M6281 Muscle weakness (generalized): Secondary | ICD-10-CM | POA: Diagnosis not present

## 2021-07-16 DIAGNOSIS — M6281 Muscle weakness (generalized): Secondary | ICD-10-CM | POA: Diagnosis not present

## 2021-07-16 DIAGNOSIS — R296 Repeated falls: Secondary | ICD-10-CM | POA: Diagnosis not present

## 2021-07-19 DIAGNOSIS — M6281 Muscle weakness (generalized): Secondary | ICD-10-CM | POA: Diagnosis not present

## 2021-07-19 DIAGNOSIS — R296 Repeated falls: Secondary | ICD-10-CM | POA: Diagnosis not present

## 2021-07-23 DIAGNOSIS — M6281 Muscle weakness (generalized): Secondary | ICD-10-CM | POA: Diagnosis not present

## 2021-07-23 DIAGNOSIS — R296 Repeated falls: Secondary | ICD-10-CM | POA: Diagnosis not present

## 2021-07-26 DIAGNOSIS — E7849 Other hyperlipidemia: Secondary | ICD-10-CM | POA: Diagnosis not present

## 2021-07-26 DIAGNOSIS — E1122 Type 2 diabetes mellitus with diabetic chronic kidney disease: Secondary | ICD-10-CM | POA: Diagnosis not present

## 2021-07-26 DIAGNOSIS — I10 Essential (primary) hypertension: Secondary | ICD-10-CM | POA: Diagnosis not present

## 2021-07-26 DIAGNOSIS — E782 Mixed hyperlipidemia: Secondary | ICD-10-CM | POA: Diagnosis not present

## 2021-07-26 DIAGNOSIS — R296 Repeated falls: Secondary | ICD-10-CM | POA: Diagnosis not present

## 2021-07-26 DIAGNOSIS — K219 Gastro-esophageal reflux disease without esophagitis: Secondary | ICD-10-CM | POA: Diagnosis not present

## 2021-07-26 DIAGNOSIS — R5383 Other fatigue: Secondary | ICD-10-CM | POA: Diagnosis not present

## 2021-07-26 DIAGNOSIS — M6281 Muscle weakness (generalized): Secondary | ICD-10-CM | POA: Diagnosis not present

## 2021-07-26 DIAGNOSIS — N1832 Chronic kidney disease, stage 3b: Secondary | ICD-10-CM | POA: Diagnosis not present

## 2021-07-29 DIAGNOSIS — R296 Repeated falls: Secondary | ICD-10-CM | POA: Diagnosis not present

## 2021-07-29 DIAGNOSIS — M6281 Muscle weakness (generalized): Secondary | ICD-10-CM | POA: Diagnosis not present

## 2021-07-31 DIAGNOSIS — E7849 Other hyperlipidemia: Secondary | ICD-10-CM | POA: Diagnosis not present

## 2021-07-31 DIAGNOSIS — I10 Essential (primary) hypertension: Secondary | ICD-10-CM | POA: Diagnosis not present

## 2021-07-31 DIAGNOSIS — Z7189 Other specified counseling: Secondary | ICD-10-CM | POA: Diagnosis not present

## 2021-07-31 DIAGNOSIS — I2699 Other pulmonary embolism without acute cor pulmonale: Secondary | ICD-10-CM | POA: Diagnosis not present

## 2021-07-31 DIAGNOSIS — Z23 Encounter for immunization: Secondary | ICD-10-CM | POA: Diagnosis not present

## 2021-07-31 DIAGNOSIS — K5100 Ulcerative (chronic) pancolitis without complications: Secondary | ICD-10-CM | POA: Diagnosis not present

## 2021-07-31 DIAGNOSIS — D692 Other nonthrombocytopenic purpura: Secondary | ICD-10-CM | POA: Diagnosis not present

## 2021-07-31 DIAGNOSIS — E1122 Type 2 diabetes mellitus with diabetic chronic kidney disease: Secondary | ICD-10-CM | POA: Diagnosis not present

## 2021-08-15 DIAGNOSIS — B351 Tinea unguium: Secondary | ICD-10-CM | POA: Diagnosis not present

## 2021-08-15 DIAGNOSIS — L110 Acquired keratosis follicularis: Secondary | ICD-10-CM | POA: Diagnosis not present

## 2021-08-15 DIAGNOSIS — E1140 Type 2 diabetes mellitus with diabetic neuropathy, unspecified: Secondary | ICD-10-CM | POA: Diagnosis not present

## 2021-09-05 DIAGNOSIS — H401211 Low-tension glaucoma, right eye, mild stage: Secondary | ICD-10-CM | POA: Diagnosis not present

## 2021-09-30 ENCOUNTER — Other Ambulatory Visit: Payer: Self-pay

## 2021-09-30 ENCOUNTER — Ambulatory Visit (INDEPENDENT_AMBULATORY_CARE_PROVIDER_SITE_OTHER): Payer: Medicare Other | Admitting: Pulmonary Disease

## 2021-09-30 DIAGNOSIS — J849 Interstitial pulmonary disease, unspecified: Secondary | ICD-10-CM | POA: Diagnosis not present

## 2021-09-30 LAB — PULMONARY FUNCTION TEST
DL/VA % pred: 93 %
DL/VA: 3.53 ml/min/mmHg/L
DLCO cor % pred: 56 %
DLCO cor: 13.91 ml/min/mmHg
DLCO unc % pred: 56 %
DLCO unc: 13.91 ml/min/mmHg
FEF 25-75 Post: 2.7 L/sec
FEF 25-75 Pre: 2.73 L/sec
FEF2575-%Change-Post: -1 %
FEF2575-%Pred-Post: 148 %
FEF2575-%Pred-Pre: 150 %
FEV1-%Change-Post: 4 %
FEV1-%Pred-Post: 70 %
FEV1-%Pred-Pre: 67 %
FEV1-Post: 1.97 L
FEV1-Pre: 1.89 L
FEV1FVC-%Change-Post: 5 %
FEV1FVC-%Pred-Pre: 120 %
FEV6-%Change-Post: -1 %
FEV6-%Pred-Post: 58 %
FEV6-%Pred-Pre: 59 %
FEV6-Post: 2.18 L
FEV6-Pre: 2.21 L
FEV6FVC-%Change-Post: 0 %
FEV6FVC-%Pred-Post: 106 %
FEV6FVC-%Pred-Pre: 107 %
FVC-%Change-Post: -1 %
FVC-%Pred-Post: 54 %
FVC-%Pred-Pre: 55 %
FVC-Post: 2.19 L
FVC-Pre: 2.22 L
Post FEV1/FVC ratio: 90 %
Post FEV6/FVC ratio: 99 %
Pre FEV1/FVC ratio: 85 %
Pre FEV6/FVC Ratio: 100 %
RV % pred: 55 %
RV: 1.59 L
TLC % pred: 52 %
TLC: 3.86 L

## 2021-09-30 NOTE — Progress Notes (Signed)
PFT done today. 

## 2021-10-02 ENCOUNTER — Telehealth: Payer: Self-pay | Admitting: Pulmonary Disease

## 2021-10-02 NOTE — Telephone Encounter (Signed)
There is some variability from year to year but the lung function is mostly stable. We will get a better idea of the state of lungs after the scheduled CT in a few weeks

## 2021-10-02 NOTE — Telephone Encounter (Signed)
Called and spoke with pt letting him know that we would send message to Dr. Vaughan Browner to have him view PFT and that we would call him once we had the results. Pt verbalized understanding.  Routing to Dr. Vaughan Browner. Please advise.

## 2021-10-04 NOTE — Telephone Encounter (Signed)
I called pt to give him ct appt info and he stated he still wants his results

## 2021-10-04 NOTE — Telephone Encounter (Signed)
I have called and LM on VM for the pt to call the office back.

## 2021-10-04 NOTE — Telephone Encounter (Signed)
Called the pt and there was no answer- LMTCB    

## 2021-10-07 NOTE — Telephone Encounter (Signed)
Called and spoke with Patient.  Dr. Matilde Bash PFT results and recommendations given.  Understanding stated.  Patient is scheduled HRCT 10/17/21.  Nothing further at this time.

## 2021-10-10 ENCOUNTER — Telehealth (HOSPITAL_COMMUNITY): Payer: Self-pay

## 2021-10-10 NOTE — Telephone Encounter (Signed)
Created in error

## 2021-10-14 DIAGNOSIS — R319 Hematuria, unspecified: Secondary | ICD-10-CM | POA: Diagnosis not present

## 2021-10-17 ENCOUNTER — Ambulatory Visit (HOSPITAL_COMMUNITY)
Admission: RE | Admit: 2021-10-17 | Discharge: 2021-10-17 | Disposition: A | Discharge status: Home / Self Care | Payer: Medicare Other | Source: Ambulatory Visit | Attending: Pulmonary Disease | Admitting: Pulmonary Disease

## 2021-10-17 ENCOUNTER — Other Ambulatory Visit: Payer: Self-pay

## 2021-10-17 DIAGNOSIS — J849 Interstitial pulmonary disease, unspecified: Secondary | ICD-10-CM | POA: Diagnosis not present

## 2021-10-17 DIAGNOSIS — I2510 Atherosclerotic heart disease of native coronary artery without angina pectoris: Secondary | ICD-10-CM | POA: Diagnosis not present

## 2021-10-17 DIAGNOSIS — S2220XA Unspecified fracture of sternum, initial encounter for closed fracture: Secondary | ICD-10-CM | POA: Diagnosis not present

## 2021-10-17 DIAGNOSIS — J84112 Idiopathic pulmonary fibrosis: Secondary | ICD-10-CM | POA: Diagnosis not present

## 2021-10-17 DIAGNOSIS — R531 Weakness: Secondary | ICD-10-CM | POA: Diagnosis not present

## 2021-10-23 DIAGNOSIS — L110 Acquired keratosis follicularis: Secondary | ICD-10-CM | POA: Diagnosis not present

## 2021-10-23 DIAGNOSIS — B351 Tinea unguium: Secondary | ICD-10-CM | POA: Diagnosis not present

## 2021-10-23 DIAGNOSIS — E1140 Type 2 diabetes mellitus with diabetic neuropathy, unspecified: Secondary | ICD-10-CM | POA: Diagnosis not present

## 2021-10-29 ENCOUNTER — Telehealth: Payer: Self-pay | Admitting: Pulmonary Disease

## 2021-10-29 NOTE — Telephone Encounter (Signed)
Called and spoke with pt's spouse Dalene Seltzer letting her know the results of pt's CT and she verbalized understanding.  While speaking with her, she stated that pt has been having more problems with breathing and said that today, pt's O2 was 81% and pt is not on any O2. Stated to her that we needed to schedule appt to have pt evaluated to see if he might need O2 and she verbalized understanding. Appt scheduled for pt tomorrow 12/21 with SG. Nothing further needed.

## 2021-10-30 ENCOUNTER — Other Ambulatory Visit: Payer: Self-pay | Admitting: Acute Care

## 2021-10-30 ENCOUNTER — Encounter: Payer: Self-pay | Admitting: Acute Care

## 2021-10-30 ENCOUNTER — Inpatient Hospital Stay (HOSPITAL_COMMUNITY)
Admission: EM | Admit: 2021-10-30 | Discharge: 2021-11-02 | DRG: 242 | Disposition: A | Discharge status: Home / Self Care | Payer: Medicare Other | Source: Ambulatory Visit | Attending: Internal Medicine | Admitting: Internal Medicine

## 2021-10-30 ENCOUNTER — Emergency Department (HOSPITAL_COMMUNITY): Payer: Medicare Other

## 2021-10-30 ENCOUNTER — Ambulatory Visit: Payer: Self-pay

## 2021-10-30 ENCOUNTER — Encounter (HOSPITAL_COMMUNITY): Payer: Self-pay

## 2021-10-30 ENCOUNTER — Ambulatory Visit: Payer: Medicare Other | Admitting: Acute Care

## 2021-10-30 ENCOUNTER — Other Ambulatory Visit: Payer: Self-pay

## 2021-10-30 VITALS — BP 148/70 | HR 40 | Temp 97.2°F | Ht 71.0 in | Wt 206.8 lb

## 2021-10-30 DIAGNOSIS — J189 Pneumonia, unspecified organism: Secondary | ICD-10-CM

## 2021-10-30 DIAGNOSIS — N1832 Chronic kidney disease, stage 3b: Secondary | ICD-10-CM | POA: Diagnosis present

## 2021-10-30 DIAGNOSIS — Z950 Presence of cardiac pacemaker: Secondary | ICD-10-CM

## 2021-10-30 DIAGNOSIS — F32A Depression, unspecified: Secondary | ICD-10-CM | POA: Diagnosis not present

## 2021-10-30 DIAGNOSIS — K219 Gastro-esophageal reflux disease without esophagitis: Secondary | ICD-10-CM | POA: Diagnosis present

## 2021-10-30 DIAGNOSIS — Z86711 Personal history of pulmonary embolism: Secondary | ICD-10-CM | POA: Diagnosis not present

## 2021-10-30 DIAGNOSIS — E875 Hyperkalemia: Secondary | ICD-10-CM | POA: Diagnosis not present

## 2021-10-30 DIAGNOSIS — R6889 Other general symptoms and signs: Secondary | ICD-10-CM | POA: Diagnosis not present

## 2021-10-30 DIAGNOSIS — Z20822 Contact with and (suspected) exposure to covid-19: Secondary | ICD-10-CM | POA: Diagnosis present

## 2021-10-30 DIAGNOSIS — E1122 Type 2 diabetes mellitus with diabetic chronic kidney disease: Secondary | ICD-10-CM | POA: Diagnosis not present

## 2021-10-30 DIAGNOSIS — Z743 Need for continuous supervision: Secondary | ICD-10-CM | POA: Diagnosis not present

## 2021-10-30 DIAGNOSIS — R531 Weakness: Secondary | ICD-10-CM

## 2021-10-30 DIAGNOSIS — E782 Mixed hyperlipidemia: Secondary | ICD-10-CM | POA: Diagnosis present

## 2021-10-30 DIAGNOSIS — I160 Hypertensive urgency: Secondary | ICD-10-CM | POA: Diagnosis not present

## 2021-10-30 DIAGNOSIS — R9431 Abnormal electrocardiogram [ECG] [EKG]: Secondary | ICD-10-CM | POA: Diagnosis not present

## 2021-10-30 DIAGNOSIS — I129 Hypertensive chronic kidney disease with stage 1 through stage 4 chronic kidney disease, or unspecified chronic kidney disease: Secondary | ICD-10-CM | POA: Diagnosis not present

## 2021-10-30 DIAGNOSIS — I442 Atrioventricular block, complete: Secondary | ICD-10-CM | POA: Diagnosis not present

## 2021-10-30 DIAGNOSIS — E1169 Type 2 diabetes mellitus with other specified complication: Secondary | ICD-10-CM

## 2021-10-30 DIAGNOSIS — Z833 Family history of diabetes mellitus: Secondary | ICD-10-CM

## 2021-10-30 DIAGNOSIS — N179 Acute kidney failure, unspecified: Secondary | ICD-10-CM | POA: Diagnosis not present

## 2021-10-30 DIAGNOSIS — D649 Anemia, unspecified: Secondary | ICD-10-CM | POA: Diagnosis not present

## 2021-10-30 DIAGNOSIS — J690 Pneumonitis due to inhalation of food and vomit: Secondary | ICD-10-CM | POA: Diagnosis present

## 2021-10-30 DIAGNOSIS — N400 Enlarged prostate without lower urinary tract symptoms: Secondary | ICD-10-CM | POA: Diagnosis present

## 2021-10-30 DIAGNOSIS — I499 Cardiac arrhythmia, unspecified: Secondary | ICD-10-CM | POA: Diagnosis not present

## 2021-10-30 DIAGNOSIS — I4430 Unspecified atrioventricular block: Secondary | ICD-10-CM

## 2021-10-30 DIAGNOSIS — I959 Hypotension, unspecified: Secondary | ICD-10-CM | POA: Diagnosis not present

## 2021-10-30 DIAGNOSIS — R001 Bradycardia, unspecified: Secondary | ICD-10-CM | POA: Diagnosis not present

## 2021-10-30 DIAGNOSIS — M952 Other acquired deformity of head: Secondary | ICD-10-CM | POA: Diagnosis not present

## 2021-10-30 DIAGNOSIS — Z79899 Other long term (current) drug therapy: Secondary | ICD-10-CM

## 2021-10-30 DIAGNOSIS — Z7984 Long term (current) use of oral hypoglycemic drugs: Secondary | ICD-10-CM | POA: Diagnosis not present

## 2021-10-30 DIAGNOSIS — R0609 Other forms of dyspnea: Secondary | ICD-10-CM

## 2021-10-30 DIAGNOSIS — E785 Hyperlipidemia, unspecified: Secondary | ICD-10-CM | POA: Diagnosis not present

## 2021-10-30 DIAGNOSIS — Z7901 Long term (current) use of anticoagulants: Secondary | ICD-10-CM

## 2021-10-30 DIAGNOSIS — G319 Degenerative disease of nervous system, unspecified: Secondary | ICD-10-CM | POA: Diagnosis not present

## 2021-10-30 DIAGNOSIS — R0902 Hypoxemia: Secondary | ICD-10-CM | POA: Diagnosis not present

## 2021-10-30 DIAGNOSIS — Z888 Allergy status to other drugs, medicaments and biological substances status: Secondary | ICD-10-CM

## 2021-10-30 DIAGNOSIS — Z66 Do not resuscitate: Secondary | ICD-10-CM | POA: Diagnosis not present

## 2021-10-30 DIAGNOSIS — Z87891 Personal history of nicotine dependence: Secondary | ICD-10-CM | POA: Diagnosis not present

## 2021-10-30 DIAGNOSIS — J849 Interstitial pulmonary disease, unspecified: Secondary | ICD-10-CM

## 2021-10-30 DIAGNOSIS — R0600 Dyspnea, unspecified: Secondary | ICD-10-CM | POA: Diagnosis not present

## 2021-10-30 DIAGNOSIS — J479 Bronchiectasis, uncomplicated: Secondary | ICD-10-CM | POA: Diagnosis not present

## 2021-10-30 DIAGNOSIS — G4733 Obstructive sleep apnea (adult) (pediatric): Secondary | ICD-10-CM | POA: Diagnosis present

## 2021-10-30 DIAGNOSIS — F419 Anxiety disorder, unspecified: Secondary | ICD-10-CM | POA: Diagnosis present

## 2021-10-30 LAB — CBC WITH DIFFERENTIAL/PLATELET
Abs Immature Granulocytes: 0.01 10*3/uL (ref 0.00–0.07)
Basophils Absolute: 0.1 10*3/uL (ref 0.0–0.1)
Basophils Relative: 1 %
Eosinophils Absolute: 0.1 10*3/uL (ref 0.0–0.5)
Eosinophils Relative: 2 %
HCT: 35.2 % — ABNORMAL LOW (ref 39.0–52.0)
Hemoglobin: 11.7 g/dL — ABNORMAL LOW (ref 13.0–17.0)
Immature Granulocytes: 0 %
Lymphocytes Relative: 21 %
Lymphs Abs: 1.4 10*3/uL (ref 0.7–4.0)
MCH: 32.2 pg (ref 26.0–34.0)
MCHC: 33.2 g/dL (ref 30.0–36.0)
MCV: 97 fL (ref 80.0–100.0)
Monocytes Absolute: 0.9 10*3/uL (ref 0.1–1.0)
Monocytes Relative: 14 %
Neutro Abs: 4.1 10*3/uL (ref 1.7–7.7)
Neutrophils Relative %: 62 %
Platelets: 147 10*3/uL — ABNORMAL LOW (ref 150–400)
RBC: 3.63 MIL/uL — ABNORMAL LOW (ref 4.22–5.81)
RDW: 12.9 % (ref 11.5–15.5)
WBC: 6.6 10*3/uL (ref 4.0–10.5)
nRBC: 0 % (ref 0.0–0.2)

## 2021-10-30 LAB — COMPREHENSIVE METABOLIC PANEL
ALT: 23 U/L (ref 0–44)
AST: 33 U/L (ref 15–41)
Albumin: 3.7 g/dL (ref 3.5–5.0)
Alkaline Phosphatase: 53 U/L (ref 38–126)
Anion gap: 7 (ref 5–15)
BUN: 33 mg/dL — ABNORMAL HIGH (ref 8–23)
CO2: 21 mmol/L — ABNORMAL LOW (ref 22–32)
Calcium: 9 mg/dL (ref 8.9–10.3)
Chloride: 112 mmol/L — ABNORMAL HIGH (ref 98–111)
Creatinine, Ser: 1.97 mg/dL — ABNORMAL HIGH (ref 0.61–1.24)
GFR, Estimated: 32 mL/min — ABNORMAL LOW (ref >60)
Glucose, Bld: 122 mg/dL — ABNORMAL HIGH (ref 70–99)
Potassium: 5.2 mmol/L — ABNORMAL HIGH (ref 3.5–5.1)
Sodium: 140 mmol/L (ref 135–145)
Total Bilirubin: 1.4 mg/dL — ABNORMAL HIGH (ref 0.3–1.2)
Total Protein: 6.8 g/dL (ref 6.5–8.1)

## 2021-10-30 LAB — TSH: TSH: 2.583 u[IU]/mL (ref 0.350–4.500)

## 2021-10-30 LAB — RESP PANEL BY RT-PCR (FLU A&B, COVID) ARPGX2
Influenza A by PCR: NEGATIVE
Influenza B by PCR: NEGATIVE
SARS Coronavirus 2 by RT PCR: NEGATIVE

## 2021-10-30 LAB — TROPONIN I (HIGH SENSITIVITY)
Troponin I (High Sensitivity): 16 ng/L (ref <18)
Troponin I (High Sensitivity): 13 ng/L (ref <18)

## 2021-10-30 LAB — PROTIME-INR
INR: 1.5 — ABNORMAL HIGH (ref 0.8–1.2)
Prothrombin Time: 17.9 seconds — ABNORMAL HIGH (ref 11.4–15.2)

## 2021-10-30 LAB — T4, FREE: Free T4: 1.03 ng/dL (ref 0.61–1.12)

## 2021-10-30 MED ORDER — HYDRALAZINE HCL 20 MG/ML IJ SOLN
5.0000 mg | Freq: Once | INTRAMUSCULAR | Status: AC
  Administered 2021-10-30: 22:00:00 5 mg via INTRAVENOUS
  Filled 2021-10-30: qty 1

## 2021-10-30 MED ORDER — LOSARTAN POTASSIUM 50 MG PO TABS: 25.0000 mg | ORAL_TABLET | Freq: Once | ORAL | Status: DC

## 2021-10-30 MED ORDER — SODIUM CHLORIDE 0.9 % IV BOLUS
500.0000 mL | Freq: Once | INTRAVENOUS | Status: AC
  Administered 2021-10-30: 19:00:00 500 mL via INTRAVENOUS

## 2021-10-30 MED ORDER — HYDRALAZINE HCL 20 MG/ML IJ SOLN
5.0000 mg | Freq: Once | INTRAMUSCULAR | Status: AC
  Administered 2021-10-30: 23:00:00 5 mg via INTRAVENOUS
  Filled 2021-10-30: qty 1

## 2021-10-30 MED ORDER — SODIUM CHLORIDE 0.9 % IV SOLN
1.0000 g | Freq: Once | INTRAVENOUS | Status: AC
  Administered 2021-10-30: 19:00:00 1 g via INTRAVENOUS
  Filled 2021-10-30: qty 10

## 2021-10-30 MED ORDER — LOSARTAN POTASSIUM 50 MG PO TABS
25.0000 mg | ORAL_TABLET | Freq: Once | ORAL | Status: AC
Start: 2021-10-30 — End: 2021-10-30
  Administered 2021-10-30: 25 mg via ORAL
  Filled 2021-10-30: qty 1

## 2021-10-30 MED ORDER — HYDRALAZINE HCL 20 MG/ML IJ SOLN
5.0000 mg | Freq: Once | INTRAMUSCULAR | Status: AC
  Administered 2021-10-30: 5 mg via INTRAVENOUS
  Filled 2021-10-30: qty 1

## 2021-10-30 MED ORDER — SODIUM CHLORIDE 0.9 % IV BOLUS
500.0000 mL | Freq: Once | INTRAVENOUS | Status: AC
  Administered 2021-10-30: 17:00:00 500 mL via INTRAVENOUS

## 2021-10-30 MED ORDER — SODIUM CHLORIDE 0.9 % IV BOLUS
500.0000 mL | Freq: Once | INTRAVENOUS | Status: AC
  Administered 2021-10-30: 21:00:00 500 mL via INTRAVENOUS

## 2021-10-30 NOTE — ED Notes (Signed)
Vonna Kotyk RN stood pt at side of bed to use urinal - pt states that he is having no pain at this time

## 2021-10-30 NOTE — ED Triage Notes (Signed)
Pt BIB EMS due to bradycardia. Pt states he was having weakness for a couple of days and made an appt with pulmonology. Dr was walking pt and he was brady while ambulating.Pt has no cardiac hx.

## 2021-10-30 NOTE — Progress Notes (Signed)
History of Present Illness Manuel Atkins is a 87 y.o. male with  history of OSA, ulcerative colitis, GERD, elevated transaminitis. Hospitalized in early January 2019 at [facility] for dyspnea on exertion, hypoxia.  He had been evaluated with echo showing normal EF, proBNP was normal, d-dimer was normal.  He was started on Lasix without improvement.  He had a CT scan which showed mild bronchiectasis with basal interstitial lung disease. He is followed in [facility] by Dr. Vaughan Browner.     10/30/2021 Pt. Presents for acute visit for low oxygen saturations at home. Per his wife his oxygen saturations have been as low as 81% per the patient they have been in the 70's. Patient is not on home oxygen. Recent CT Chest was not indicative of progression of ILD, but drop in satuations is concerning.  He has experienced worse than normal dyspnea from his baseline for the last week or  so. He has not had any sick contacts. He has not not experienced and choking on his food. He denies any fever. He has no increase in secretions compared to his baseline.NO worsening cough Nursing wonders if he has been checking his oxygen saturations when his hands are cold, or if he has poor circulation. His HR is 40 BPM today. Previous HR's have been in the 80's and 90's per record review. We will do an EKG to evaluate rhythm. He is on losartan 100 mg daily for HTN. BP in the office today is 148/70. No swelling in his feet that is more than his baseline.  He is very symptomatic with dyspnea and profound weakness. His oxygen saturations are 99% on RA. We will send him to the ED at [facility] via EMS for further evaluation by cardiology of his bradycardia and advanced degree AV block..   Test Results: EKG 10/30/2021 A-V dissociation due to advanced degree A-V block  # P waves = 9  cv(PR) = 45 Abnormal rhythm, No further analysis  INSUFFICIENT DATA HR 41 BPM/ PR - Unable to calculate as it appears patient is in a Wenckebach Rhythm QT  498 ms / QTc B 411 ms / QTc H 465 ms  HRCT Chest 10/17/2021 Pulmonary parenchymal pattern of fibrosis appears stable from 08/21/2020 and may be due to fibrotic nonspecific interstitial pneumonitis or usual interstitial pneumonitis. Findings are categorized as probable UIP per consensus guidelines: Diagnosis of Idiopathic Pulmonary Fibrosis Enlarged pulmonic trunk, indicative of pulmonary arterial hypertension  CBC Latest Ref Rng & Units 05/12/2021 09/27/2019 09/26/2019  WBC 4.0 - 10.5 K/uL 11.8(H) 7.5 8.2  Hemoglobin 13.0 - 17.0 g/dL 14.6 12.7(L) 13.6  Hematocrit 39.0 - 52.0 % 44.7 38.7(L) 39.8  Platelets 150 - 400 K/uL 212 167 168    BMP Latest Ref Rng & Units 06/28/2021 05/12/2021 05/12/2021  Glucose 70 - 99 mg/dL 267(H) 159(H) 202(H)  BUN 6 - 23 mg/dL 31(H) 34(H) 35(H)  Creatinine 0.40 - 1.50 mg/dL 1.61(H) 2.11(H) 2.33(H)  BUN/Creat Ratio 6 - 22 (calc) - - -  Sodium 135 - 145 mEq/L 135 135 132(L)  Potassium 3.5 - 5.1 mEq/L 4.2 5.3(H) 5.9(H)  Chloride 96 - 112 mEq/L 99 107 101  CO2 19 - 32 mEq/L 26 19(L) 21(L)  Calcium 8.4 - 10.5 mg/dL 9.3 8.3(L) 9.2    BNP    Component Value Date/Time   BNP 23 09/26/2019 1646    ProBNP    Component Value Date/Time   PROBNP 26.0 05/23/2021 1001    PFT    Component Value  Date/Time   FEV1PRE 1.89 09/30/2021 0849   FEV1POST 1.97 09/30/2021 0849   FVCPRE 2.22 09/30/2021 0849   FVCPOST 2.19 09/30/2021 0849   TLC 3.86 09/30/2021 0849   DLCOUNC 13.91 09/30/2021 0849   PREFEV1FVCRT 85 09/30/2021 0849   PSTFEV1FVCRT 90 09/30/2021 0849    CT Chest High Resolution  Result Date: 10/18/2021 CLINICAL DATA:  Interstitial lung disease, weakness and chronic shortness of breath. EXAM: CT CHEST WITHOUT CONTRAST TECHNIQUE: Multidetector CT imaging of the chest was performed following the standard protocol without intravenous contrast. High resolution imaging of the lungs, as well as inspiratory and expiratory imaging, was performed. COMPARISON:   08/21/2020 in 10/18/2018. FINDINGS: Cardiovascular: Atherosclerotic calcification of the aorta, aortic valve and coronary arteries. Pulmonic trunk and heart are enlarged. No pericardial effusion. Mediastinum/Nodes: No pathologically enlarged mediastinal or axillary lymph nodes. Hilar regions are difficult to evaluate without IV contrast. Esophagus is grossly unremarkable. Lungs/Pleura: Biapical pleuroparenchymal scarring. Peripheral and basilar subpleural reticulation, ground-glass and traction bronchiolectasis/bronchiolectasis, similar to 08/21/2020. Pleural calcification in the anterior left hemithorax. A few scattered millimetric pulmonary nodules are unchanged and considered benign. Pleural thickening in the posterior left hemithorax. No pleural fluid. Airway is unremarkable. Upper Abdomen: Liver margin is slightly irregular. Cholecystectomy. Visualized portions of the adrenal glands and right kidney are unremarkable. Punctate stone in the left kidney. Visualized portions of the spleen, pancreas, stomach and bowel are grossly unremarkable. Left hemidiaphragm is elevated. No upper abdominal adenopathy. Musculoskeletal: Degenerative changes in the spine. Flowing anterior osteophytosis in the thoracic spine. Old sternal fracture. No worrisome lytic or sclerotic lesions. IMPRESSION: 1. Pulmonary parenchymal pattern of fibrosis appears stable from 08/21/2020 and may be due to fibrotic nonspecific interstitial pneumonitis or usual interstitial pneumonitis. Findings are categorized as probable UIP per consensus guidelines: Diagnosis of Idiopathic Pulmonary Fibrosis: An Official ATS/ERS/JRS/ALAT Clinical Practice Guideline. [place], Iss 5, [phone], Jul 11 2017. 2. Marginal irregularity of the liver is indicative of cirrhosis. 3. Punctate left renal stone. 4. Aortic atherosclerosis (ICD10-I70.0). Coronary artery calcification. 5. Enlarged pulmonic trunk, indicative of pulmonary arterial  hypertension. Electronically Signed   By: Lorin Picket M.D.   On: 10/18/2021 12:36     Past medical hx Past Medical History:  Diagnosis Date   CMV colitis ([place]) 11/08/2012   Dyspnea    Essential hypertension, benign    GERD (gastroesophageal reflux disease)    Headache(784.0)    History of colon polyps    History of transfusion of whole blood    Interstitial lung disease ([place]) 12/01/2017   Mixed hyperlipidemia    Obstructive sleep apnea (adult) (pediatric)    uses bipap @ HS   Other malaise and fatigue    Thrombocytopenia ([place]) 11/10/2012   Type II or unspecified type diabetes mellitus without mention of complication, uncontrolled    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[facility])      Social History   Tobacco Use   Smoking status: Former    Types: Pipe, Cigars    Quit date: 11/10/1968    Years since quitting: 53.0   Smokeless tobacco: Never  Vaping Use   Vaping Use: Never used  Substance Use Topics   Alcohol use: No    Alcohol/week: 0.0 standard drinks   Drug use: No    Mr.Dauria reports that he quit smoking about 53 years ago. His smoking use included pipe and cigars. He has never used smokeless tobacco. He reports that he does not drink alcohol and does not use drugs.  Tobacco Cessation: Former smoker , quit 1970 with an unknown smoking history   Past surgical hx, Family hx, Social hx all reviewed.  Current Outpatient Medications on File Prior to Visit  Medication Sig   acetaminophen (TYLENOL) 500 MG tablet Take 1,000 mg by mouth every 6 (six) hours as needed for moderate pain or headache.   ALPRAZolam (XANAX) 0.25 MG tablet Take 0.5 tablets (0.125 mg total) by mouth 3 (three) times daily as needed for anxiety. for anxiety   diphenhydrAMINE (BENADRYL) 50 MG capsule Take 50 mg by mouth daily.   ELIQUIS 5 MG TABS tablet TAKE ONE TABLET BY MOUTH TWICE DAILY   glipiZIDE (GLUCOTROL XL) 5 MG 24 hr tablet Take 10 mg by mouth daily with breakfast.   imipramine (TOFRANIL) 25  MG tablet Take 75 mg by mouth at bedtime.   losartan (COZAAR) 100 MG tablet Take 100 mg by mouth daily.   metFORMIN (GLUCOPHAGE-XR) 500 MG 24 hr tablet Take 500-1,000 mg by mouth See admin instructions. Patient takes 500 mg in the morning and 1000 mg at night.   pantoprazole (PROTONIX) 40 MG tablet TAKE ONE TABLET BY MOUTH EVERY *OTHER* DAY. (Patient taking differently: Take 40 mg by mouth every other day.)   Probiotic Product (PROBIOTIC DAILY PO) Take 1 capsule by mouth daily.    rosuvastatin (CRESTOR) 10 MG tablet Take 10 mg by mouth every evening.    tamsulosin (FLOMAX) 0.4 MG CAPS capsule Take 0.4 mg by mouth at bedtime.   latanoprost (XALATAN) 0.005 % ophthalmic solution Place 1 drop into both eyes at bedtime.   No current facility-administered medications on file prior to visit.     Allergies  Allergen Reactions   Mercaptopurine Other (See Comments)    High Fever, Chills, Fatigue    Review Of Systems:  Constitutional:   No  weight loss, night sweats,  Fevers, chills, ++fatigue, or  lassitude.  HEENT:   No headaches,  Difficulty swallowing,  Tooth/dental problems, or  Sore throat,                No sneezing, itching, ear ache, nasal congestion, post nasal drip,   CV:  No chest pain,  Orthopnea, PND, Trace swelling in lower extremities, anasarca, dizziness, palpitations, syncope.   GI  No heartburn, indigestion, abdominal pain, nausea, vomiting, diarrhea, change in bowel habits, loss of appetite, bloody stools.   Resp: ++ shortness of breath with exertion less  at rest.  No excess mucus, no productive cough,  No non-productive cough,  No coughing up of blood.  No change in color of mucus.  No wheezing.  No chest wall deformity  Skin: no rash or lesions.  GU: no dysuria, change in color of urine, no urgency or frequency.  No flank pain, no hematuria   MS:  No joint pain or swelling.  + decreased range of motion.  No back pain.  Psych:  No change in mood or affect. No  depression or anxiety.  No memory loss.   Vital Signs BP (!) 148/70 (BP Location: Left Arm, Cuff Size: Normal)    Pulse (!) 40    Temp (!) 97.2 F (36.2 C) (Axillary)    Ht 5' 11"  (1.803 m)    Wt 206 lb 12.8 oz (93.8 kg)    SpO2 97% Comment: on RA   BMI 28.84 kg/m    Physical Exam:  General- No distress,  A&Ox3, pleasant but clearly does not feel well.  ENT: No sinus tenderness, TM clear,  pale nasal mucosa, no oral exudate,no post nasal drip, no LAN Cardiac: S1, S2, irregular rate and rhythm, no murmur, HR 40 BPM  confirmed by 12 Lead Chest: No wheeze/ rales/ dullness; no accessory muscle use, no nasal flaring, no sternal retractions, crackles per bases ( ILD) Abd.: Soft Non-tender, ND, BS +, Body mass index is 28.84 kg/m.  Ext: No clubbing cyanosis, Trace edema BLE Neuro:  Physically deconditioned, MAE x 4, walks with walker, A&O Skin: No rashes, warm and dry, No lesions  Psych: normal mood and behavior   Assessment/Plan  ILD with worsening hypoxemia, despite HRCT 10/17/2021 with no progression of disease noted Oxygen saturations are 97-99% on RA today in the office  HR 40 bpm with an advanced level AV block per EKG, symptomatic with dyspnea and extreme fatigue Plan We will do an EKG today.  EKG shows that your heart rate is very low and your atrium and ventricles are not in synch with one another. We will send you to the ED via EMS for evaluation by cardiology and interventions as needed to correct this AV dissociation Follow up with [place] once you have been discharged from the hospital. Call if you need [place] sooner Please contact office for sooner follow up if symptoms do not improve or worsen or seek emergency care   I have called the ED at [facility] and they are aware the patient is on his way vis EMS.  ( Triage RN to let Charge Nurse know)   I spent 90  minutes dedicated to the care of this patient on the date of this encounter to include pre-visit review of records, face-to-face  time with the patient discussing conditions above, post visit ordering of testing, clinical documentation with the electronic health record, making appropriate referrals as documented, and communicating necessary information to the patient's healthcare team.   Magdalen Spatz, NP 10/30/2021  3:41 PM

## 2021-10-30 NOTE — ED Notes (Signed)
MD notified of updated BP - order to be placed for another dose of hydralazine

## 2021-10-30 NOTE — Patient Instructions (Addendum)
It is good to see you today. We will do an EKG today.  We need to evaluate why your heart rate is so low.  We will walk you to see if we can qualify you for oxygen if your EKG is normal. .  Follow up with cardiology ( Dr. Alcus Dad)  Consider additional physical therapy for deconditioning.

## 2021-10-30 NOTE — ED Notes (Addendum)
Pt placed on Zoll pads. Suction and O2 set up at bedside.

## 2021-10-30 NOTE — ED Notes (Signed)
Pt returned from CT - pt hooked back up to pads and monitor - MD aware of vitals

## 2021-10-30 NOTE — ED Notes (Signed)
MD aware of pt bp and HR. Will continue to monitor.
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
## 2021-10-31 ENCOUNTER — Observation Stay (HOSPITAL_COMMUNITY): Payer: Medicare Other

## 2021-10-31 ENCOUNTER — Inpatient Hospital Stay (HOSPITAL_COMMUNITY)
Admission: EM | Disposition: A | Discharge status: Home / Self Care | Payer: Self-pay | Source: Ambulatory Visit | Attending: Internal Medicine

## 2021-10-31 DIAGNOSIS — R001 Bradycardia, unspecified: Secondary | ICD-10-CM | POA: Diagnosis not present

## 2021-10-31 DIAGNOSIS — J849 Interstitial pulmonary disease, unspecified: Secondary | ICD-10-CM | POA: Diagnosis present

## 2021-10-31 DIAGNOSIS — E875 Hyperkalemia: Secondary | ICD-10-CM | POA: Diagnosis present

## 2021-10-31 DIAGNOSIS — Z87891 Personal history of nicotine dependence: Secondary | ICD-10-CM | POA: Diagnosis not present

## 2021-10-31 DIAGNOSIS — N1832 Chronic kidney disease, stage 3b: Secondary | ICD-10-CM | POA: Diagnosis not present

## 2021-10-31 DIAGNOSIS — E1122 Type 2 diabetes mellitus with diabetic chronic kidney disease: Secondary | ICD-10-CM | POA: Diagnosis not present

## 2021-10-31 DIAGNOSIS — I517 Cardiomegaly: Secondary | ICD-10-CM | POA: Diagnosis not present

## 2021-10-31 DIAGNOSIS — I442 Atrioventricular block, complete: Secondary | ICD-10-CM | POA: Diagnosis not present

## 2021-10-31 DIAGNOSIS — J479 Bronchiectasis, uncomplicated: Secondary | ICD-10-CM | POA: Diagnosis not present

## 2021-10-31 DIAGNOSIS — J690 Pneumonitis due to inhalation of food and vomit: Secondary | ICD-10-CM

## 2021-10-31 DIAGNOSIS — Z79899 Other long term (current) drug therapy: Secondary | ICD-10-CM | POA: Diagnosis not present

## 2021-10-31 DIAGNOSIS — Z86711 Personal history of pulmonary embolism: Secondary | ICD-10-CM | POA: Diagnosis not present

## 2021-10-31 DIAGNOSIS — K219 Gastro-esophageal reflux disease without esophagitis: Secondary | ICD-10-CM | POA: Diagnosis present

## 2021-10-31 DIAGNOSIS — I160 Hypertensive urgency: Secondary | ICD-10-CM | POA: Diagnosis present

## 2021-10-31 DIAGNOSIS — E785 Hyperlipidemia, unspecified: Secondary | ICD-10-CM | POA: Diagnosis not present

## 2021-10-31 DIAGNOSIS — I129 Hypertensive chronic kidney disease with stage 1 through stage 4 chronic kidney disease, or unspecified chronic kidney disease: Secondary | ICD-10-CM | POA: Diagnosis not present

## 2021-10-31 DIAGNOSIS — Z7984 Long term (current) use of oral hypoglycemic drugs: Secondary | ICD-10-CM | POA: Diagnosis not present

## 2021-10-31 DIAGNOSIS — I4430 Unspecified atrioventricular block: Secondary | ICD-10-CM

## 2021-10-31 DIAGNOSIS — Z950 Presence of cardiac pacemaker: Secondary | ICD-10-CM | POA: Diagnosis not present

## 2021-10-31 DIAGNOSIS — E782 Mixed hyperlipidemia: Secondary | ICD-10-CM | POA: Diagnosis not present

## 2021-10-31 DIAGNOSIS — J189 Pneumonia, unspecified organism: Secondary | ICD-10-CM | POA: Diagnosis not present

## 2021-10-31 DIAGNOSIS — N400 Enlarged prostate without lower urinary tract symptoms: Secondary | ICD-10-CM | POA: Diagnosis present

## 2021-10-31 DIAGNOSIS — G4733 Obstructive sleep apnea (adult) (pediatric): Secondary | ICD-10-CM | POA: Diagnosis present

## 2021-10-31 DIAGNOSIS — Z20822 Contact with and (suspected) exposure to covid-19: Secondary | ICD-10-CM | POA: Diagnosis not present

## 2021-10-31 DIAGNOSIS — N179 Acute kidney failure, unspecified: Secondary | ICD-10-CM | POA: Diagnosis not present

## 2021-10-31 DIAGNOSIS — Z66 Do not resuscitate: Secondary | ICD-10-CM | POA: Diagnosis not present

## 2021-10-31 DIAGNOSIS — J811 Chronic pulmonary edema: Secondary | ICD-10-CM | POA: Diagnosis not present

## 2021-10-31 DIAGNOSIS — Z7901 Long term (current) use of anticoagulants: Secondary | ICD-10-CM | POA: Diagnosis not present

## 2021-10-31 DIAGNOSIS — D649 Anemia, unspecified: Secondary | ICD-10-CM | POA: Diagnosis not present

## 2021-10-31 DIAGNOSIS — F32A Depression, unspecified: Secondary | ICD-10-CM | POA: Diagnosis not present

## 2021-10-31 DIAGNOSIS — R9431 Abnormal electrocardiogram [ECG] [EKG]: Secondary | ICD-10-CM

## 2021-10-31 DIAGNOSIS — F419 Anxiety disorder, unspecified: Secondary | ICD-10-CM | POA: Diagnosis present

## 2021-10-31 DIAGNOSIS — E1169 Type 2 diabetes mellitus with other specified complication: Secondary | ICD-10-CM | POA: Diagnosis not present

## 2021-10-31 HISTORY — PX: PACEMAKER IMPLANT: EP1218

## 2021-10-31 LAB — SURGICAL PCR SCREEN
MRSA, PCR: NEGATIVE
Staphylococcus aureus: NEGATIVE

## 2021-10-31 LAB — RETICULOCYTES
Immature Retic Fract: 11.4 % (ref 2.3–15.9)
RBC.: 3.81 MIL/uL — ABNORMAL LOW (ref 4.22–5.81)
Retic Count, Absolute: 56 10*3/uL (ref 19.0–186.0)
Retic Ct Pct: 1.5 % (ref 0.4–3.1)

## 2021-10-31 LAB — BASIC METABOLIC PANEL
Anion gap: 9 (ref 5–15)
BUN: 28 mg/dL — ABNORMAL HIGH (ref 8–23)
CO2: 18 mmol/L — ABNORMAL LOW (ref 22–32)
Calcium: 8.5 mg/dL — ABNORMAL LOW (ref 8.9–10.3)
Chloride: 111 mmol/L (ref 98–111)
Creatinine, Ser: 1.79 mg/dL — ABNORMAL HIGH (ref 0.61–1.24)
GFR, Estimated: 36 mL/min — ABNORMAL LOW (ref >60)
Glucose, Bld: 148 mg/dL — ABNORMAL HIGH (ref 70–99)
Potassium: 5.3 mmol/L — ABNORMAL HIGH (ref 3.5–5.1)
Sodium: 138 mmol/L (ref 135–145)

## 2021-10-31 LAB — IRON AND TIBC
Iron: 68 ug/dL (ref 45–182)
Saturation Ratios: 22 % (ref 17.9–39.5)
TIBC: 314 ug/dL (ref 250–450)
UIBC: 246 ug/dL

## 2021-10-31 LAB — GLUCOSE, CAPILLARY
Glucose-Capillary: 104 mg/dL — ABNORMAL HIGH (ref 70–99)
Glucose-Capillary: 124 mg/dL — ABNORMAL HIGH (ref 70–99)

## 2021-10-31 LAB — CBG MONITORING, ED
Glucose-Capillary: 118 mg/dL — ABNORMAL HIGH (ref 70–99)
Glucose-Capillary: 116 mg/dL — ABNORMAL HIGH (ref 70–99)

## 2021-10-31 LAB — ECHOCARDIOGRAM COMPLETE
AR max vel: 1.77 cm2
AV Area VTI: 1.55 cm2
AV Area mean vel: 1.77 cm2
AV Mean grad: 11 mmHg
AV Peak grad: 21.2 mmHg
Ao pk vel: 2.3 m/s
Area-P 1/2: 4.8 cm2
Calc EF: 75 %
Height: 71 in
P 1/2 time: 554 msec
S' Lateral: 3.1 cm
Single Plane A2C EF: 77.3 %
Single Plane A4C EF: 72.4 %
Weight: 3264 oz

## 2021-10-31 LAB — VITAMIN B12: Vitamin B-12: 208 pg/mL (ref 180–914)

## 2021-10-31 LAB — PROCALCITONIN: Procalcitonin: <0.1 ng/mL

## 2021-10-31 LAB — FERRITIN: Ferritin: 115 ng/mL (ref 24–336)

## 2021-10-31 LAB — HEMOGLOBIN A1C
Hgb A1c MFr Bld: 7.4 % — ABNORMAL HIGH (ref 4.8–5.6)
Mean Plasma Glucose: 165.68 mg/dL

## 2021-10-31 LAB — FOLATE: Folate: 28.5 ng/mL (ref >5.9)

## 2021-10-31 SURGERY — PACEMAKER IMPLANT

## 2021-10-31 MED ORDER — SODIUM CHLORIDE 0.9% FLUSH
3.0000 mL | Freq: Two times a day (BID) | INTRAVENOUS | Status: DC
  Administered 2021-10-31 – 2021-11-02 (×3): 3 mL via INTRAVENOUS

## 2021-10-31 MED ORDER — INSULIN ASPART 100 UNIT/ML IJ SOLN: 0.0000 [IU] | Freq: Every day | INTRAMUSCULAR | Status: DC

## 2021-10-31 MED ORDER — TAMSULOSIN HCL 0.4 MG PO CAPS
0.4000 mg | ORAL_CAPSULE | Freq: Every day | ORAL | Status: DC
  Administered 2021-10-31 – 2021-11-01 (×2): 0.4 mg via ORAL
  Filled 2021-10-31 (×2): qty 1

## 2021-10-31 MED ORDER — LIDOCAINE HCL 1 % IJ SOLN
INTRAMUSCULAR | Status: AC
  Filled 2021-10-31: qty 60

## 2021-10-31 MED ORDER — INSULIN ASPART 100 UNIT/ML IJ SOLN
0.0000 [IU] | Freq: Three times a day (TID) | INTRAMUSCULAR | Status: DC
  Administered 2021-11-01 (×3): 2 [IU] via SUBCUTANEOUS
  Administered 2021-11-02: 13:00:00 3 [IU] via SUBCUTANEOUS

## 2021-10-31 MED ORDER — ACETAMINOPHEN 650 MG RE SUPP
650.0000 mg | Freq: Four times a day (QID) | RECTAL | Status: DC | PRN
  Filled 2021-10-31: qty 1

## 2021-10-31 MED ORDER — ACETAMINOPHEN 650 MG RE SUPP: 650.0000 mg | Freq: Four times a day (QID) | RECTAL | Status: DC | PRN

## 2021-10-31 MED ORDER — MIDAZOLAM HCL 5 MG/5ML IJ SOLN
INTRAMUSCULAR | Status: AC
  Filled 2021-10-31: qty 5

## 2021-10-31 MED ORDER — ACETAMINOPHEN 325 MG PO TABS
650.0000 mg | ORAL_TABLET | Freq: Four times a day (QID) | ORAL | Status: DC | PRN
  Administered 2021-11-01: 23:00:00 650 mg via ORAL
  Filled 2021-10-31: qty 2

## 2021-10-31 MED ORDER — HEPARIN (PORCINE) IN NACL 1000-0.9 UT/500ML-% IV SOLN
INTRAVENOUS | Status: DC | PRN
  Administered 2021-10-31: 500 mL

## 2021-10-31 MED ORDER — SODIUM CHLORIDE 0.9 % IV SOLN: INTRAVENOUS | Status: DC

## 2021-10-31 MED ORDER — MIDAZOLAM HCL 5 MG/5ML IJ SOLN
INTRAMUSCULAR | Status: DC | PRN
  Administered 2021-10-31 (×2): 1 mg via INTRAVENOUS

## 2021-10-31 MED ORDER — CEFAZOLIN SODIUM-DEXTROSE 2-4 GM/100ML-% IV SOLN
2.0000 g | INTRAVENOUS | Status: AC
  Administered 2021-10-31: 15:00:00 2 g via INTRAVENOUS
  Filled 2021-10-31: qty 100

## 2021-10-31 MED ORDER — SODIUM CHLORIDE 0.9% FLUSH: 3.0000 mL | INTRAVENOUS | Status: DC | PRN

## 2021-10-31 MED ORDER — SODIUM CHLORIDE 0.9 % IV SOLN
INTRAVENOUS | Status: AC
  Filled 2021-10-31: qty 2

## 2021-10-31 MED ORDER — CEFAZOLIN SODIUM-DEXTROSE 2-4 GM/100ML-% IV SOLN
INTRAVENOUS | Status: AC
  Filled 2021-10-31: qty 100

## 2021-10-31 MED ORDER — FENTANYL CITRATE (PF) 100 MCG/2ML IJ SOLN
INTRAMUSCULAR | Status: AC
  Filled 2021-10-31: qty 2

## 2021-10-31 MED ORDER — SODIUM CHLORIDE 0.9 % IV SOLN
250.0000 mL | INTRAVENOUS | Status: DC
  Administered 2021-10-31: 12:00:00 250 mL via INTRAVENOUS

## 2021-10-31 MED ORDER — PERFLUTREN LIPID MICROSPHERE
1.0000 mL | INTRAVENOUS | Status: AC | PRN
Start: 2021-10-31 — End: 2021-10-31
  Administered 2021-10-31: 08:00:00 3 mL via INTRAVENOUS
  Filled 2021-10-31: qty 10

## 2021-10-31 MED ORDER — ONDANSETRON HCL 4 MG/2ML IJ SOLN: 4.0000 mg | Freq: Four times a day (QID) | INTRAMUSCULAR | Status: DC | PRN

## 2021-10-31 MED ORDER — SODIUM CHLORIDE 0.9 % IV SOLN
80.0000 mg | INTRAVENOUS | Status: AC
  Administered 2021-10-31: 16:00:00 80 mg
  Filled 2021-10-31: qty 2

## 2021-10-31 MED ORDER — PANTOPRAZOLE SODIUM 40 MG PO TBEC
40.0000 mg | DELAYED_RELEASE_TABLET | Freq: Every day | ORAL | Status: DC
  Administered 2021-11-01 – 2021-11-02 (×2): 40 mg via ORAL
  Filled 2021-10-31 (×3): qty 1

## 2021-10-31 MED ORDER — IMIPRAMINE HCL 25 MG PO TABS
75.0000 mg | ORAL_TABLET | Freq: Every day | ORAL | Status: DC
  Administered 2021-10-31 – 2021-11-01 (×2): 75 mg via ORAL
  Filled 2021-10-31 (×5): qty 3

## 2021-10-31 MED ORDER — ACETAMINOPHEN 325 MG PO TABS: 325.0000 mg | ORAL_TABLET | ORAL | Status: DC | PRN

## 2021-10-31 MED ORDER — CHLORHEXIDINE GLUCONATE 4 % EX LIQD
60.0000 mL | Freq: Once | CUTANEOUS | Status: DC
  Filled 2021-10-31: qty 60

## 2021-10-31 MED ORDER — AMPICILLIN-SULBACTAM SODIUM 3 (2-1) G IJ SOLR
3.0000 g | Freq: Four times a day (QID) | INTRAMUSCULAR | Status: DC
  Administered 2021-10-31 – 2021-11-02 (×8): 3 g via INTRAVENOUS
  Filled 2021-10-31 (×12): qty 8

## 2021-10-31 MED ORDER — FENTANYL CITRATE (PF) 100 MCG/2ML IJ SOLN
INTRAMUSCULAR | Status: DC | PRN
  Administered 2021-10-31 (×2): 12.5 ug via INTRAVENOUS

## 2021-10-31 MED ORDER — HYDRALAZINE HCL 20 MG/ML IJ SOLN
5.0000 mg | Freq: Four times a day (QID) | INTRAMUSCULAR | Status: DC | PRN
  Administered 2021-10-31 – 2021-11-01 (×4): 5 mg via INTRAVENOUS
  Filled 2021-10-31 (×4): qty 1

## 2021-10-31 MED ORDER — ACETAMINOPHEN 325 MG PO TABS: 650.0000 mg | ORAL_TABLET | Freq: Four times a day (QID) | ORAL | Status: DC | PRN

## 2021-10-31 MED ORDER — CEFAZOLIN SODIUM-DEXTROSE 1-4 GM/50ML-% IV SOLN
1.0000 g | Freq: Four times a day (QID) | INTRAVENOUS | Status: DC
  Administered 2021-10-31 – 2021-11-01 (×2): 1 g via INTRAVENOUS
  Filled 2021-10-31 (×3): qty 50

## 2021-10-31 MED ORDER — ROSUVASTATIN CALCIUM 5 MG PO TABS
10.0000 mg | ORAL_TABLET | Freq: Every evening | ORAL | Status: DC
  Administered 2021-10-31 – 2021-11-01 (×2): 10 mg via ORAL
  Filled 2021-10-31 (×2): qty 2

## 2021-10-31 MED ORDER — LIDOCAINE HCL (PF) 1 % IJ SOLN
INTRAMUSCULAR | Status: DC | PRN
  Administered 2021-10-31: 15:00:00 60 mL

## 2021-10-31 SURGICAL SUPPLY — 10 items
CABLE SURGICAL S-101-97-12 (CABLE) ×3 IMPLANT
KIT ACCESSORY SELECTRA FIX CVD (MISCELLANEOUS) ×2 IMPLANT
LEAD SELECTRA 3D-55-42 (CATHETERS) ×2 IMPLANT
LEAD SOLIA S PRO MRI 53 (Lead) ×2 IMPLANT
LEAD SOLIA S PRO MRI 60 (Lead) ×2 IMPLANT
PACEMAKER EDORA 8DR-T MRI (Pacemaker) ×2 IMPLANT
PAD DEFIB RADIO PHYSIO CONN (PAD) ×3 IMPLANT
SHEATH 7FR PRELUDE SNAP 13 (SHEATH) ×2 IMPLANT
SHEATH 9FR PRELUDE SNAP 13 (SHEATH) ×2 IMPLANT
TRAY PACEMAKER INSERTION (PACKS) ×3 IMPLANT
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
## 2021-10-31 NOTE — Plan of Care (Signed)
°  Problem: Education: Goal: Knowledge of General Education information will improve Description: Including pain rating scale, medication(s)/side effects and non-pharmacologic comfort measures Outcome: Progressing   Problem: Clinical Measurements: Goal: Ability to maintain clinical measurements within normal limits will improve Outcome: Progressing Goal: Respiratory complications will improve Outcome: Progressing Goal: Cardiovascular complication will be avoided Outcome: Progressing   Problem: Activity: Goal: Risk for activity intolerance will decrease Outcome: Progressing   Problem: Nutrition: Goal: Adequate nutrition will be maintained Outcome: Progressing

## 2021-10-31 NOTE — ED Notes (Signed)
RT called to come set patient up on bipap

## 2021-10-31 NOTE — Discharge Instructions (Signed)
° ° °  Supplemental Discharge Instructions for  Pacemaker/Defibrillator Patients   Activity No heavy lifting or vigorous activity with your left/right arm for 6 to 8 weeks.  Do not raise your left/right arm above your head for one week.  Gradually raise your affected arm as drawn below.             11/05/21                    11/06/21                 11/07/21                 11/08/21 __  NO DRIVING for  1 week   ; you may begin driving on   11/11/70  .  WOUND CARE Keep the wound area clean and dry.  Do not get this area wet , no showers for one week; you may shower on     . The tape/steri-strips on your wound will fall off; do not pull them off.  No bandage is needed on the site.  DO  NOT apply any creams, oils, or ointments to the wound area. If you notice any drainage or discharge from the wound, any swelling or bruising at the site, or you develop a fever > 101? F after you are discharged home, call the office at once.  Special Instructions You are still able to use cellular telephones; use the ear opposite the side where you have your pacemaker/defibrillator.  Avoid carrying your cellular phone near your device. When traveling through airports, show security personnel your identification card to avoid being screened in the metal detectors.  Ask the security personnel to use the hand wand. Avoid arc welding equipment, MRI testing (magnetic resonance imaging), TENS units (transcutaneous nerve stimulators).  Call the office for questions about other devices. Avoid electrical appliances that are in poor condition or are not properly grounded. Microwave ovens are safe to be near or to operate.

## 2021-10-31 NOTE — ED Notes (Signed)
MD Rathore at bedside

## 2021-10-31 NOTE — Plan of Care (Signed)
°  Problem: Education: Goal: Knowledge of cardiac device and self-care will improve Outcome: Progressing Goal: Ability to safely manage health related needs after discharge will improve Outcome: Progressing   Problem: Cardiac: Goal: Ability to achieve and maintain adequate cardiopulmonary perfusion will improve Outcome: Progressing   Problem: Education: Goal: Knowledge of General Education information will improve Description: Including pain rating scale, medication(s)/side effects and non-pharmacologic comfort measures Outcome: Progressing   Problem: Clinical Measurements: Goal: Ability to maintain clinical measurements within normal limits will improve Outcome: Progressing Goal: Respiratory complications will improve Outcome: Progressing Goal: Cardiovascular complication will be avoided Outcome: Progressing   Problem: Activity: Goal: Risk for activity intolerance will decrease Outcome: Progressing   Problem: Nutrition: Goal: Adequate nutrition will be maintained Outcome: Progressing   Problem: Skin Integrity: Goal: Risk for impaired skin integrity will decrease Outcome: Progressing

## 2021-10-31 NOTE — Progress Notes (Signed)
SLP Cancellation Note  Patient Details Name: NAZIM KADLEC MRN: [id number] DOB: [birth year]   Cancelled treatment:       Reason Eval/Treat Not Completed: Patient not medically ready (Pt's case was discussed with Katharine Look, RN who advised that the pt is currently on CPAP and agreed to contact SLP when pt is appropriate for the swallow evaluation.)  Samara Stankowski I. Hardin Negus, [place], [place] Office number [phone] Pager [place] 10/31/2021, 9:51 AM

## 2021-10-31 NOTE — ED Notes (Signed)
Providers at bedside.

## 2021-10-31 NOTE — Progress Notes (Signed)
PROGRESS NOTE  Manuel Atkins  DOB: 1933/03/29  PCP: [facility] DEY:[id number]  DOA: 10/30/2021  LOS: 0 days  Hospital Day: 2  Chief Complaint  Patient presents with   Bradycardia   Brief narrative: Manuel Atkins is a 87 y.o. male with PMH significant for DM2, HTN, HLD, CKD 3, OSA on BiPAP, GERD, ulcerative enterocolitis, CMV colitis, BPH. Patient was having progressively worsening shortness of breath and weakness for the last few days and made an appointment with his pulmonologist.  Seen at [facility] pulmonology office yesterday 12/21 and was noted to be significantly bradycardic to 30s and 40s.  EKG in the office showed third-degree AV block.   EMS was called and patient was sent to the ED.  In the ED, his heart rate was 42, blood pressure significantly elevated to over 200 in multiple readings. Lab with potassium elevated to 5.2, BUN/creatinine elevated to 33/1.97 (baseline creatinine 1.6), WBC count normal, hemoglobin 11.7, TSH normal. Troponin negative x2, procalcitonin level normal Chest x-ray showed interval development of lingular/retrocardiac airspace opacity concerning for possible aspiration pneumonia.   CT head negative for acute finding.  ED physician discussed the case with on-call cardiologist who felt that the patient's symptoms were primarily due to aspiration pneumonia and not due to bradycardia.   Patient was started on IV Rocephin, IV fluid  Admitted to hospitalist service    Subjective: Patient was seen and examined this morning.  Pleasant elderly Caucasian male.  Propped up in bed.  Not in distress.  Has CPAP on.  Alert, awake, oriented x3.  Wife at bedside. Heart rate in 50s at this time, blood pressure in 180s Chart reviewed Remains bradycardic consistently throughout the night and 30s and 40s, blood pressure remains elevated over 200 Labs this morning with potassium level remains elevated at 5.3, BUN/creatinine slightly better at  28/1.79  Assessment/Plan: Symptomatic bradycardia  Third-degree AV block -Fatigue, shortness of breath, bradycardia.   -EKG with third-degree AV block with heart rate in 30s and 40s  -Thyroid function test normal. Not on any AV nodal blocking agents. -Admitting physician discussed the case with on-call cardiologist Dr. Alveta Heimlich  -EP consult pending.  May need pacemaker.   Hypertensive urgency -Blood pressure consistently elevated to systolic above 314. -On losartan at home.  On hold because of AKI -Currently only on IV hydralazine as needed. -Per cardiology note, hypotension most likely due to complete heart block and should improve with pacing.  AKI on CKD 3b -Baseline creatinine less than 1.6 -Presented with a creatinine of 1.9.  Probably related to CHB and elevated blood pressure. -Losartan on hold. Recent Labs    05/12/21 1451 05/12/21 2137 06/28/21 1021 10/30/21 1629 10/31/21 0622  BUN 35* 34* 31* 33* 28*  CREATININE 2.33* 2.11* 1.61* 1.97* 1.79*   History of ILD Possible aspiration pneumonia -Patient history of ILD.  Recent HRCT from 10/17/2021 did not any progression of disease.   -Chest x-ray done on admission showed interval development of lingular/retrocardiac airspace opacity concerning for possible aspiration pneumonia.   -COVID and influenza PCR negative.  No fever or leukocytosis.  Procalcitonin level negative -Empirically started on IV Unasyn on admission. -Speech evaluation pending.  Aspiration precautions.  Type 2 diabetes mellitus -A1c 7.4 on 12/22 -Home meds include glipizide 10 mg daily, metformin 500 mg in the morning and 1000 mg at night -Currently on SSI with Accu-Cheks.  Glipizide and metformin on hold.  Since his baseline creatinine is more than 1.4 all times, I would  stop metformin.  Discussed with patient and wife. Recent Labs  Lab 10/31/21 0811  GLUCAP 118*     Hyperlipidemia -Continue Crestor  History of PE -Resume Eliquis    GERD -Continue Protonix   Anxiety/depression -Continue imipramine, Xanax as needed   OSA -Continue nightly BiPAP   BPH -Continue Flomax  Mobility: Encourage ambulation postprocedure Living condition: Lives at home with wife Goals of care:   Code Status: DNR per chart Nutritional status: Body mass index is 28.45 kg/m.      Diet:  Diet Order             Diet NPO time specified  Diet effective now                  DVT prophylaxis: SCDsPlace and maintain sequential compression device Start: 10/31/21 1112  Antimicrobials: Currently on IV Unasyn Fluid: None Consultants: Cardiology Family Communication: Wife at bedside  Status is: Observation  Continue in-hospital care because: Needs pacemaker placement Level of care: Progressive   Dispo: The patient is from: Home              Anticipated d/c is to: Hopefully home in 1 to 2 days              Patient currently is not medically stable to d/c.   Difficult to place patient No     Infusions:   sodium chloride     sodium chloride     ampicillin-sulbactam (UNASYN) IV Stopped (10/31/21 1005)    ceFAZolin (ANCEF) IV      Scheduled Meds:  chlorhexidine  60 mL Topical Once   chlorhexidine  60 mL Topical Once   gentamicin irrigation  80 mg Irrigation On Call   imipramine  75 mg Oral QHS   insulin aspart  0-5 Units Subcutaneous QHS   insulin aspart  0-9 Units Subcutaneous TID WC   pantoprazole  40 mg Oral Daily   rosuvastatin  10 mg Oral QPM   sodium chloride flush  3 mL Intravenous Q12H   tamsulosin  0.4 mg Oral QHS    PRN meds: acetaminophen **OR** acetaminophen, hydrALAZINE, sodium chloride flush   Antimicrobials: Anti-infectives (From admission, onward)    Start     Dose/Rate Route Frequency Ordered Stop   10/31/21 0915  gentamicin (GARAMYCIN) 80 mg in sodium chloride 0.9 % 500 mL irrigation        80 mg Irrigation On call 10/31/21 0902 11/01/21 0915   10/31/21 0915  ceFAZolin (ANCEF) IVPB 2g/100 mL  premix        2 g 200 mL/hr over 30 Minutes Intravenous On call 10/31/21 0902 11/01/21 0915   10/31/21 0645  Ampicillin-Sulbactam (UNASYN) 3 g in sodium chloride 0.9 % 100 mL IVPB        3 g 200 mL/hr over 30 Minutes Intravenous Every 6 hours 10/31/21 0630     10/30/21 1800  cefTRIAXone (ROCEPHIN) 1 g in sodium chloride 0.9 % 100 mL IVPB        1 g 200 mL/hr over 30 Minutes Intravenous  Once 10/30/21 1758 10/30/21 1847       Objective: Vitals:   10/31/21 0930 10/31/21 1000  BP: (!) 191/57 (!) 196/49  Pulse: (!) 45 (!) 44  Resp: (!) 23 (!) 25  Temp:    SpO2: 100% 100%    Intake/Output Summary (Last 24 hours) at 10/31/2021 1113 Last data filed at 10/31/2021 1005 Gross per 24 hour  Intake 701.44 ml  Output --  Net 701.44 ml   Filed Weights   10/30/21 1624  Weight: 92.5 kg   Weight change:  Body mass index is 28.45 kg/m.   Physical Exam: General exam: Pleasant, elderly Caucasian male.  Not in physical distress Skin: No rashes, lesions or ulcers. HEENT: Atraumatic, normocephalic, no obvious bleeding Lungs: Clear to auscultation bilaterally CVS: Sinus bradycardia, no murmur GI/Abd soft, nontender, nondistended, bowel sound present CNS: Alert, awake, oriented x3 Psychiatry: Mood appropriate Extremities: No pedal edema, no calf tenderness  Data Review: I have personally reviewed the laboratory data and studies available.  F/u labs ordered Unresulted Labs (From admission, onward)     Start     Ordered   11/01/21 0500  CBC with Differential/Platelet  Daily,   R      10/31/21 1113   11/01/21 4944  Basic metabolic panel  Daily,   R      10/31/21 1113   10/31/21 0903  Surgical PCR screen  (Screening)  Once,   R        10/31/21 0902   10/31/21 0553  Occult blood card to lab, stool  Once,   R        10/31/21 0552   10/31/21 0552  Culture, blood (routine x 2)  BLOOD CULTURE X 2,   R      10/31/21 0552   10/30/21 1758  Expectorated Sputum Assessment w Gram Stain, Rflx  to Resp Cult  Once,   R        10/30/21 1758            Signed, Terrilee Croak, MD [facility] 10/31/2021
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
## 2021-10-31 NOTE — Progress Notes (Signed)
Pharmacy Antibiotic Note  Manuel Atkins is a 87 y.o. male admitted on 10/30/2021 with concern for aspiration pneumonia.  Pharmacy has been consulted for Unasyn dosing.  Plan: Unasyn 3g IV Q6H.  Height: 5' 11"  (180.3 cm) Weight: 92.5 kg (204 lb) IBW/kg (Calculated) : 75.3  Temp (24hrs), Avg:97.6 F (36.4 C), Min:97.2 F (36.2 C), Max:98.1 F (36.7 C)  Recent Labs  Lab 10/30/21 1629  WBC 6.6  CREATININE 1.97*    Estimated Creatinine Clearance: 31.3 mL/min (A) (by C-G formula based on SCr of 1.97 mg/dL (H)).    Allergies  Allergen Reactions   Mercaptopurine Other (See Comments)    High Fever, Chills, Fatigue    Thank you for allowing pharmacy to be a part of this patients care.  Wynona Neat, PharmD, BCPS  10/31/2021 6:30 AM

## 2021-10-31 NOTE — ED Notes (Signed)
MD rathore called back this RN - notified of pt status regarding BP and HR - per MD just observe patient for the time being

## 2021-10-31 NOTE — ED Notes (Signed)
Pt placed on pads at this time - pt has severe sleep apnea  and wears a bipap at night - MD  notified

## 2021-10-31 NOTE — ED Notes (Signed)
MD messaged regarding patients heartrate dropping to 39. Patient is very shaky and short of breath.
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
## 2021-10-31 NOTE — ED Notes (Signed)
Patient awake/alert, ECHO in progress.

## 2021-10-31 NOTE — ED Notes (Signed)
Pt beginning to frequently become more bradycardic at 89 - MD R[place] notified

## 2021-11-01 ENCOUNTER — Inpatient Hospital Stay (HOSPITAL_COMMUNITY): Payer: Medicare Other

## 2021-11-01 ENCOUNTER — Encounter (HOSPITAL_COMMUNITY): Payer: Self-pay | Admitting: Internal Medicine

## 2021-11-01 DIAGNOSIS — I442 Atrioventricular block, complete: Secondary | ICD-10-CM | POA: Diagnosis not present

## 2021-11-01 LAB — CBC WITH DIFFERENTIAL/PLATELET
Abs Immature Granulocytes: 0.03 10*3/uL (ref 0.00–0.07)
Basophils Absolute: 0.1 10*3/uL (ref 0.0–0.1)
Basophils Relative: 1 %
Eosinophils Absolute: 0.1 10*3/uL (ref 0.0–0.5)
Eosinophils Relative: 1 %
HCT: 35.8 % — ABNORMAL LOW (ref 39.0–52.0)
Hemoglobin: 11.6 g/dL — ABNORMAL LOW (ref 13.0–17.0)
Immature Granulocytes: 0 %
Lymphocytes Relative: 10 %
Lymphs Abs: 0.9 10*3/uL (ref 0.7–4.0)
MCH: 31.7 pg (ref 26.0–34.0)
MCHC: 32.4 g/dL (ref 30.0–36.0)
MCV: 97.8 fL (ref 80.0–100.0)
Monocytes Absolute: 1.1 10*3/uL — ABNORMAL HIGH (ref 0.1–1.0)
Monocytes Relative: 12 %
Neutro Abs: 6.8 10*3/uL (ref 1.7–7.7)
Neutrophils Relative %: 76 %
Platelets: 145 10*3/uL — ABNORMAL LOW (ref 150–400)
RBC: 3.66 MIL/uL — ABNORMAL LOW (ref 4.22–5.81)
RDW: 13.1 % (ref 11.5–15.5)
WBC: 9 10*3/uL (ref 4.0–10.5)
nRBC: 0 % (ref 0.0–0.2)

## 2021-11-01 LAB — BASIC METABOLIC PANEL
Anion gap: 11 (ref 5–15)
BUN: 27 mg/dL — ABNORMAL HIGH (ref 8–23)
CO2: 16 mmol/L — ABNORMAL LOW (ref 22–32)
Calcium: 8.6 mg/dL — ABNORMAL LOW (ref 8.9–10.3)
Chloride: 113 mmol/L — ABNORMAL HIGH (ref 98–111)
Creatinine, Ser: 1.77 mg/dL — ABNORMAL HIGH (ref 0.61–1.24)
GFR, Estimated: 37 mL/min — ABNORMAL LOW (ref >60)
Glucose, Bld: 139 mg/dL — ABNORMAL HIGH (ref 70–99)
Potassium: 4.3 mmol/L (ref 3.5–5.1)
Sodium: 140 mmol/L (ref 135–145)

## 2021-11-01 LAB — GLUCOSE, CAPILLARY
Glucose-Capillary: 189 mg/dL — ABNORMAL HIGH (ref 70–99)
Glucose-Capillary: 193 mg/dL — ABNORMAL HIGH (ref 70–99)
Glucose-Capillary: 102 mg/dL — ABNORMAL HIGH (ref 70–99)

## 2021-11-01 MED ORDER — LOSARTAN POTASSIUM 50 MG PO TABS: 100.0000 mg | ORAL_TABLET | Freq: Every day | ORAL | Status: DC

## 2021-11-01 MED ORDER — LOSARTAN POTASSIUM 25 MG PO TABS: 25.0000 mg | ORAL_TABLET | Freq: Every day | ORAL | Status: DC

## 2021-11-01 MED ORDER — CARVEDILOL 3.125 MG PO TABS: 3.1250 mg | ORAL_TABLET | Freq: Two times a day (BID) | ORAL | 2 refills | Status: DC

## 2021-11-01 MED ORDER — ZOLPIDEM TARTRATE 5 MG PO TABS
5.0000 mg | ORAL_TABLET | Freq: Every evening | ORAL | Status: DC | PRN
  Administered 2021-11-01 (×2): 5 mg via ORAL
  Filled 2021-11-01 (×2): qty 1

## 2021-11-01 MED ORDER — AMLODIPINE BESYLATE 5 MG PO TABS
5.0000 mg | ORAL_TABLET | Freq: Every day | ORAL | Status: DC
  Administered 2021-11-02: 10:00:00 5 mg via ORAL
  Filled 2021-11-01: qty 1

## 2021-11-01 MED ORDER — FUROSEMIDE 10 MG/ML IJ SOLN
20.0000 mg | Freq: Once | INTRAMUSCULAR | Status: AC
  Administered 2021-11-01: 13:00:00 20 mg via INTRAVENOUS
  Filled 2021-11-01: qty 2

## 2021-11-01 MED ORDER — AMLODIPINE BESYLATE 5 MG PO TABS: 5.0000 mg | ORAL_TABLET | Freq: Every day | ORAL | 11 refills | Status: DC

## 2021-11-01 MED ORDER — METOPROLOL SUCCINATE ER 50 MG PO TB24
50.0000 mg | ORAL_TABLET | Freq: Every day | ORAL | Status: DC
  Administered 2021-11-01 – 2021-11-02 (×2): 50 mg via ORAL
  Filled 2021-11-01 (×2): qty 1

## 2021-11-01 MED ORDER — LOSARTAN POTASSIUM 25 MG PO TABS
25.0000 mg | ORAL_TABLET | Freq: Every day | ORAL | Status: DC
  Administered 2021-11-02: 10:00:00 25 mg via ORAL
  Filled 2021-11-01: qty 1

## 2021-11-01 MED ORDER — MORPHINE SULFATE (PF) 2 MG/ML IV SOLN
1.0000 mg | Freq: Once | INTRAVENOUS | Status: AC
  Administered 2021-11-01: 18:00:00 1 mg via INTRAVENOUS
  Filled 2021-11-01: qty 1

## 2021-11-01 MED ORDER — AMOXICILLIN-POT CLAVULANATE 875-125 MG PO TABS: 1.0000 | ORAL_TABLET | Freq: Two times a day (BID) | ORAL | 0 refills | Status: DC

## 2021-11-01 MED ORDER — LOSARTAN POTASSIUM 25 MG PO TABS: 25.0000 mg | ORAL_TABLET | Freq: Every day | ORAL | 2 refills | Status: DC

## 2021-11-01 MED FILL — Lidocaine HCl Local Inj 1%: INTRAMUSCULAR | Qty: 60 | Status: AC

## 2021-11-01 NOTE — Progress Notes (Signed)
Modified Barium Swallow Progress Note  Patient Details  Name: Manuel Atkins MRN: [id number] Date of Birth: June 03, 1933  Today's Date: 11/01/2021  Modified Barium Swallow completed.  Full report located under Chart Review in the Imaging Section.  Brief recommendations include the following:  Clinical Impression  Pt presents with pharyngeal dysphagia characterized by reduced pharyngeal constriction and a pharyngeal delay. Mild vallecular residue was improved with a liquid wash. Epiglottic inversion was inconsistently incomplete and he exhibited penetration (PAS 3) with thin liquids via straw which was eliminated with use of a chin tuck posture. Bolus sizes were relatively small due to pt's dislike of the flavor of the barium; considering his presentation at bedside, SLP suspects that severity of laryngeal invasion worsens with larger bolus sizes. A regular texture diet with thin liquids is recommended at this time with observance of swallowing precautions.   Swallow Evaluation Recommendations       SLP Diet Recommendations: Regular solids;Thin liquid   Liquid Administration via: Cup;No straw (straw may be used with chin tuck)   Medication Administration: Whole meds with liquid   Supervision: Patient able to self feed   Compensations: Slow rate;Small sips/bites (chin tuck if straws are used)   Postural Changes: Seated upright at 90 degrees   Oral Care Recommendations: Oral care BID      Adream Parzych I. Hardin Negus, [place] [address], [place] Office number [phone] Pager [place] 11/01/2021,9:19 AM

## 2021-11-01 NOTE — Discharge Summary (Signed)
Physician Discharge Summary  Manuel Atkins TFT:[id number] DOB: [birth year] DOA: 10/30/2021  PCP: [facility]  Admit date: 10/30/2021 Discharge date: 11/01/2021  Admitted From: Home Discharge disposition: Home with home health PT, OT, RN, aide   Code Status: DNR   Discharge Diagnosis:   Principal Problem:   Bradycardia Active Problems:   Anemia   AV block   Aspiration pneumonia ([place])   Hypertensive urgency    Chief Complaint  Patient presents with   Bradycardia   Brief narrative: Manuel Atkins is a 87 y.o. male with PMH significant for DM2, HTN, HLD, CKD 3, OSA on BiPAP, GERD, ulcerative enterocolitis, CMV colitis, BPH. Patient was having progressively worsening shortness of breath and weakness for the last few days and made an appointment with his pulmonologist.  Seen at [facility] pulmonology office yesterday 12/21 and was noted to be significantly bradycardic to 30s and 40s.  EKG in the office showed third-degree AV block.   EMS was called and patient was sent to the ED.  In the ED, his heart rate was 42, blood pressure significantly elevated to over 200 in multiple readings. Lab with potassium elevated to 5.2, BUN/creatinine elevated to 33/1.97 (baseline creatinine 1.6), WBC count normal, hemoglobin 11.7, TSH normal. Troponin negative x2, procalcitonin level normal Chest x-ray showed interval development of lingular/retrocardiac airspace opacity concerning for possible aspiration pneumonia.   CT head negative for acute finding.  ED physician discussed the case with on-call cardiologist who felt that the patient's symptoms were primarily due to aspiration pneumonia and not due to bradycardia.   Patient was started on IV Rocephin, IV fluid  Admitted to hospitalist service   Subjective: Patient was seen and examined this morning.   Lying down in bed.  Not in distress.  Not on supplemental oxygen.  He is weak.  Wife at bedside.   Underwent permanent placement  placement yesterday. Patient shows keen interest in going home.  He lives with his wife who is the sole care giver.  She is at bedside and she seems more interested in going to the rehab.  PT eval obtained.  Home with PT recommended.  Assessment/Plan: Symptomatic bradycardia  Third-degree AV block -Presented with fatigue, shortness of breath, bradycardia.   -EKG with third-degree AV block with heart rate in 30s and 40s  -Seen by cardiology.  Underwent permanent pacemaker placement 12/22.   Hypertensive urgency -Blood pressure consistently elevated to systolic above 706. -Per cardiology note, hypotension most likely due to complete heart block  -After pacemaker insertion, blood pressure still runs elevated to 170s.   -At discharge, start on Coreg 3.25 mg, amlodipine 5 mg daily and resume losartan at a lower dose -Continue to monitor blood pressure at home.  AKI on CKD 3b -Baseline creatinine less than 1.6 -Presented with a creatinine of 1.9.  Probably related to CHB and elevated blood pressure.  Gradually improving. -Continue to monitor as an outpatient. Recent Labs    05/12/21 1451 05/12/21 2137 06/28/21 1021 10/30/21 1629 10/31/21 0622 11/01/21 0158  BUN 35* 34* 31* 33* 28* 27*  CREATININE 2.33* 2.11* 1.61* 1.97* 1.79* 1.77*   History of ILD Possible aspiration pneumonia -Patient history of ILD.  Recent HRCT from 10/17/2021 did not any progression of disease.   -Chest x-ray done on admission showed interval development of lingular/retrocardiac airspace opacity concerning for possible aspiration pneumonia.   -COVID and influenza PCR negative.  No fever or leukocytosis.  Procalcitonin level negative -Empirically started on IV Unasyn on  admission. -Speech evaluation appreciated.  Discharge on 3 more days of oral Augmentin.  Type 2 diabetes mellitus -A1c 7.4 on 12/22 -Home meds include glipizide 10 mg daily, metformin 500 mg in the morning and 1000 mg at night -Currently on  SSI with Accu-Cheks.  Glipizide and metformin on hold.  Since his baseline creatinine is more than 1.4 all times, I would stop metformin.  Discussed with patient and wife.  Can resume glipizide post discharge. Recent Labs  Lab 10/31/21 1222 10/31/21 1800 10/31/21 2338 11/01/21 0741 11/01/21 1100  GLUCAP 116* 124* 104* 189* 193*    Hyperlipidemia -Continue Crestor  History of PE -Resume Eliquis   GERD -Continue Protonix   Anxiety/depression -Continue imipramine, Xanax as needed   OSA -Continue nightly BiPAP   BPH -Continue Flomax  Mobility: Encourage ambulation postprocedure Living condition: Lives at home with wife Goals of care:   Code Status: DNR per chart Nutritional status: Body mass index is 28.45 kg/m.     Discharge Medications:   Allergies as of 11/01/2021       Reactions   Mercaptopurine Other (See Comments)   High Fever, Chills, Fatigue        Medication List     STOP taking these medications    metFORMIN 500 MG 24 hr tablet Commonly known as: GLUCOPHAGE-XR       TAKE these medications    acetaminophen 500 MG tablet Commonly known as: TYLENOL Take 1,000 mg by mouth every 6 (six) hours as needed for moderate pain or headache.   ALPRAZolam 0.25 MG tablet Commonly known as: XANAX Take 0.5 tablets (0.125 mg total) by mouth 3 (three) times daily as needed for anxiety. for anxiety   amLODipine 5 MG tablet Commonly known as: NORVASC Take 1 tablet (5 mg total) by mouth daily.   amoxicillin-clavulanate 875-125 MG tablet Commonly known as: Augmentin Take 1 tablet by mouth 2 (two) times daily for 3 days.   carvedilol 3.125 MG tablet Commonly known as: Coreg Take 1 tablet (3.125 mg total) by mouth 2 (two) times daily.   diphenhydrAMINE 50 MG capsule Commonly known as: BENADRYL Take 50 mg by mouth at bedtime as needed for itching.   Eliquis 5 MG Tabs tablet Generic drug: apixaban TAKE ONE TABLET BY MOUTH TWICE DAILY   glipiZIDE 10 MG  24 hr tablet Commonly known as: GLUCOTROL XL Take 10 mg by mouth daily.   imipramine 25 MG tablet Commonly known as: TOFRANIL Take 75 mg by mouth at bedtime.   losartan 25 MG tablet Commonly known as: COZAAR Take 1 tablet (25 mg total) by mouth daily. What changed:  medication strength how much to take   Multivitamin Adults 50+ Tabs Take 1 tablet by mouth daily.   pantoprazole 40 MG tablet Commonly known as: PROTONIX TAKE ONE TABLET BY MOUTH EVERY *OTHER* DAY. What changed: See the new instructions.   PROBIOTIC DAILY PO Take 1 capsule by mouth daily.   Rocklatan 0.02-0.005 % Soln Generic drug: Netarsudil-Latanoprost Apply 1 drop to eye at bedtime.   rosuvastatin 10 MG tablet Commonly known as: CRESTOR Take 10 mg by mouth every evening.   tamsulosin 0.4 MG Caps capsule Commonly known as: FLOMAX Take 0.4 mg by mouth at bedtime.               Discharge Care Instructions  (From admission, onward)           Start     Ordered   11/01/21 0000  Discharge wound care:  11/01/21 1505            Wound care:   Incision (Closed) 10/31/21 Chest Left;Upper (Active)  Date First Assessed/Time First Assessed: 10/31/21 1700   Location: Chest  Location Orientation: Left;Upper  Present on Admission: Yes    Assessments 10/31/2021  5:24 PM 11/01/2021  8:10 AM  Dressing Type Non adherent;Transparent dressing Non adherent;Transparent dressing  Dressing Clean;Dry;Intact Clean;Dry;Intact  Site / Wound Assessment Clean;Dry Clean;Dry  Drainage Amount None None     No Linked orders to display    Discharge Instructions:   Discharge Instructions     Call MD for:  difficulty breathing, headache or visual disturbances   Complete by: As directed    Call MD for:  extreme fatigue   Complete by: As directed    Call MD for:  hives   Complete by: As directed    Call MD for:  persistant dizziness or light-headedness   Complete by: As directed    Call MD for:   persistant nausea and vomiting   Complete by: As directed    Call MD for:  severe uncontrolled pain   Complete by: As directed    Call MD for:  temperature >100.4   Complete by: As directed    Diet - low sodium heart healthy   Complete by: As directed    Diet Carb Modified   Complete by: As directed    Discharge instructions   Complete by: As directed    General discharge instructions:  Follow with Primary MD [facility] in 7 days   Get CBC/BMP checked in next visit within 1 week by PCP or SNF MD. (We routinely change or add medications that can affect your baseline labs and fluid status, therefore we recommend that you get the mentioned basic workup next visit with your PCP, your PCP may decide not to get them or add new tests based on their clinical decision)  On your next visit with your PCP, please get your medicines reviewed and adjusted.  Please request your PCP  to go over all hospital tests, procedures, radiology results at the follow up, please get all Hospital records sent to your PCP by signing hospital release before you go home.  Activity: As tolerated with Full fall precautions use walker/cane & assistance as needed  Avoid using any recreational substances like cigarette, tobacco, alcohol, or non-prescribed drug.  If you experience worsening of your admission symptoms, develop shortness of breath, life threatening emergency, suicidal or homicidal thoughts you must seek medical attention immediately by calling 911 or calling your MD immediately  if symptoms less severe.  You must read complete instructions/literature along with all the possible adverse reactions/side effects for all the medicines you take and that have been prescribed to you. Take any new medicine only after you have completely understood and accepted all the possible adverse reactions/side effects.   Do not drive, operate heavy machinery, perform activities at heights, swimming or  participation in water activities or provide baby sitting services if your were admitted for syncope or siezures until you have seen by Primary MD or a Neurologist and advised to do so again.  Do not drive when taking Pain medications.  Do not take more than prescribed Pain, Sleep and Anxiety Medications  Wear Seat belts while driving.  Please note You were cared for by a hospitalist during your hospital stay. If you have any questions about your discharge medications or the care you received while you were in  the hospital after you are discharged, you can call the unit and asked to speak with the hospitalist on call if the hospitalist that took care of you is not available. Once you are discharged, your primary care physician will handle any further medical issues. Please note that NO REFILLS for any discharge medications will be authorized once you are discharged, as it is imperative that you return to your primary care physician (or establish a relationship with a primary care physician if you do not have one) for your aftercare needs so that they can reassess your need for medications and monitor your lab values.   Discharge wound care:   Complete by: As directed    Increase activity slowly   Complete by: As directed        Follow ups:    Follow-up Information     [facility] Follow up.   Contact information: [address] [place] [address] [phone]                 Discharge Exam:   Vitals:   10/31/21 2133 11/01/21 0410 11/01/21 1100 11/01/21 1318  BP: (!) 196/72 (!) 170/76 (!) 172/73 (!) 187/100  Pulse:  89 74 97  Resp:  19 19   Temp:  97.8 F (36.6 C) 98.5 F (36.9 C)   TempSrc:  Oral Oral   SpO2:  95% 96%   Weight:      Height:        Body mass index is 28.45 kg/m.    General exam: Pleasant, elderly Caucasian male.  Not in physical distress Skin: No rashes, lesions or ulcers. HEENT: Atraumatic, normocephalic, no obvious  bleeding Lungs: Clear to auscultation bilaterally CVS: Sinus bradycardia, no murmur GI/Abd soft, nontender, nondistended, bowel sound present CNS: Alert, awake, oriented x3 Psychiatry: Mood appropriate Extremities: No pedal edema, no calf tenderness  Time coordinating discharge: 35 minutes   The results of significant diagnostics from this hospitalization (including imaging, microbiology, ancillary and laboratory) are listed below for reference.    Procedures and Diagnostic Studies:   CT HEAD WO CONTRAST (5MM)  Result Date: 10/30/2021 CLINICAL DATA:  Weakness and bradycardia with difficulty ambulating. EXAM: CT HEAD WITHOUT CONTRAST TECHNIQUE: Contiguous axial images were obtained from the base of the skull through the vertex without intravenous contrast. COMPARISON:  May 12, 2021 FINDINGS: Brain: There is mild cerebral atrophy with widening of the extra-axial spaces and ventricular dilatation. There are areas of decreased attenuation within the white matter tracts of the supratentorial brain, consistent with microvascular disease changes. Vascular: No hyperdense vessel or unexpected calcification. Skull: A chronic deformity of the right parietal region of the skull is noted. Sinuses/Orbits: No acute finding. Other: None. IMPRESSION: 1. Generalized cerebral atrophy. 2. No acute intracranial abnormality. Electronically Signed   By: Virgina Norfolk M.D.   On: 10/30/2021 21:15   EP PPM/ICD IMPLANT  Result Date: 10/31/2021 CONCLUSIONS:  1. Successful implantation of a Biotronik dual-chamber pacemaker for symptomatic bradycardia due to complete heart block  2. No early apparent complications.       Cristopher Peru, MD 10/31/2021 4:11 PM    DG Chest Port 1 View  Result Date: 10/30/2021 CLINICAL DATA:  Bradycardia EXAM: PORTABLE CHEST 1 VIEW COMPARISON:  Chest x-ray 12/24/2020 FINDINGS: Cardiac paddles overlie the chest. The heart and mediastinal contours are unchanged. Biapical pleural/pulmonary  scarring. Interval development of lingular/retrocardiac airspace opacity. No pulmonary edema. No pleural effusion. No pneumothorax. No acute osseous abnormality. IMPRESSION: Interval development of  lingular/retrocardiac airspace opacity. Findings may represent infection/inflammation (such as aspiration pneumonia). Electronically Signed   By: Iven Finn M.D.   On: 10/30/2021 17:20   ECHOCARDIOGRAM COMPLETE  Result Date: 10/31/2021    ECHOCARDIOGRAM REPORT   Patient Name:   KHORI ROSEVEAR Date of Exam: 10/31/2021 Medical Rec #:  [id number]     Height:       71.0 in Accession #:    [id number]    Weight:       204.0 lb Date of Birth:  1933/05/21      BSA:          2.126 m Patient Age:    87 years      BP:           205/49 mmHg Patient Gender: M             HR:           37 bpm. Exam Location:  Inpatient Procedure: 2D Echo, Cardiac Doppler, Color Doppler and Intracardiac            Opacification Agent STAT ECHO Indications:    Abnormal ECG R94.31  History:        Patient has prior history of Echocardiogram examinations, most                 recent 11/13/2017. Risk Factors:Hypertension, Diabetes, Sleep                 Apnea and Dyslipidemia. GERD.  Sonographer:    Darlina Sicilian RDCS Referring Phys: [id number] [place]  1. Left ventricular ejection fraction, by estimation, is 70 to 75%. The left ventricle has hyperdynamic function. The left ventricle has no regional wall motion abnormalities. Left ventricular diastolic parameters were normal.  2. Right ventricular systolic function is normal. The right ventricular size is normal. There is mildly elevated pulmonary artery systolic pressure.  3. The mitral valve is grossly normal. No evidence of mitral valve regurgitation. No evidence of mitral stenosis. Moderate mitral annular calcification.  4. The aortic valve has an indeterminant number of cusps. There is mild calcification of the aortic valve. There is mild thickening of the aortic valve. Aortic  valve regurgitation is mild. Mild aortic valve stenosis.  5. The inferior vena cava is normal in size with <50% respiratory variability, suggesting right atrial pressure of 8 mmHg. Comparison(s): Prior images unable to be directly viewed, comparison made by report only. No significant change from prior study. Conclusion(s)/Recommendation(s): Otherwise normal echocardiogram, with minor abnormalities described in the report. FINDINGS  Left Ventricle: Left ventricular ejection fraction, by estimation, is 70 to 75%. The left ventricle has hyperdynamic function. The left ventricle has no regional wall motion abnormalities. Definity contrast agent was given IV to delineate the left ventricular endocardial borders. The left ventricular internal cavity size was normal in size. There is no left ventricular hypertrophy. Left ventricular diastolic parameters were normal. Right Ventricle: The right ventricular size is normal. Right vetricular wall thickness was not well visualized. Right ventricular systolic function is normal. There is mildly elevated pulmonary artery systolic pressure. The tricuspid regurgitant velocity  is 2.81 m/s, and with an assumed right atrial pressure of 8 mmHg, the estimated right ventricular systolic pressure is 00.7 mmHg. Left Atrium: Left atrial size was normal in size. Right Atrium: Right atrial size was not well visualized. Pericardium: There is no evidence of pericardial effusion. Mitral Valve: The mitral valve is grossly normal. Moderate mitral annular calcification. No evidence of mitral  valve regurgitation. No evidence of mitral valve stenosis. Tricuspid Valve: The tricuspid valve is grossly normal. Tricuspid valve regurgitation is trivial. No evidence of tricuspid stenosis. Aortic Valve: The aortic valve has an indeterminant number of cusps. There is mild calcification of the aortic valve. There is mild thickening of the aortic valve. Aortic valve regurgitation is mild. Aortic regurgitation  PHT measures 554 msec. Mild aortic stenosis is present. Aortic valve mean gradient measures 11.0 mmHg. Aortic valve peak gradient measures 21.2 mmHg. Aortic valve area, by VTI measures 1.55 cm. Pulmonic Valve: The pulmonic valve was grossly normal. Pulmonic valve regurgitation is trivial. No evidence of pulmonic stenosis. Aorta: The aortic root and ascending aorta are structurally normal, with no evidence of dilitation. Venous: The inferior vena cava is normal in size with less than 50% respiratory variability, suggesting right atrial pressure of 8 mmHg. IAS/Shunts: The interatrial septum was not well visualized.  LEFT VENTRICLE PLAX 2D LVIDd:         5.70 cm      Diastology LVIDs:         3.10 cm      LV e' medial:    20.00 cm/s LV PW:         1.00 cm      LV E/e' medial:  6.6 LV IVS:        0.90 cm      LV e' lateral:   20.50 cm/s LVOT diam:     2.20 cm      LV E/e' lateral: 6.4 LV SV:         91 LV SV Index:   43 LVOT Area:     3.80 cm  LV Volumes (MOD) LV vol d, MOD A2C: 94.7 ml LV vol d, MOD A4C: 116.0 ml LV vol s, MOD A2C: 21.5 ml LV vol s, MOD A4C: 32.0 ml LV SV MOD A2C:     73.2 ml LV SV MOD A4C:     116.0 ml LV SV MOD BP:      78.8 ml RIGHT VENTRICLE RV S prime:     16.80 cm/s TAPSE (M-mode): 3.1 cm LEFT ATRIUM             Index LA diam:        3.90 cm 1.83 cm/m LA Vol (A2C):   36.3 ml 17.07 ml/m LA Vol (A4C):   37.6 ml 17.68 ml/m LA Biplane Vol: 37.6 ml 17.68 ml/m  AORTIC VALVE AV Area (Vmax):    1.77 cm AV Area (Vmean):   1.77 cm AV Area (VTI):     1.55 cm AV Vmax:           230.40 cm/s AV Vmean:          151.333 cm/s AV VTI:            0.588 m AV Peak Grad:      21.2 mmHg AV Mean Grad:      11.0 mmHg LVOT Vmax:         107.40 cm/s LVOT Vmean:        70.300 cm/s LVOT VTI:          0.239 m LVOT/AV VTI ratio: 0.41 AI PHT:            554 msec  AORTA Ao Asc diam: 3.60 cm MITRAL VALVE                TRICUSPID VALVE MV Area (PHT): 4.80 cm     TR Peak  grad:   31.6 mmHg MV Decel Time: 158 msec     TR  Vmax:        281.00 cm/s MV E velocity: 131.00 cm/s MV A velocity: 58.50 cm/s   SHUNTS MV E/A ratio:  2.24         Systemic VTI:  0.24 m                             Systemic Diam: 2.20 cm Buford Dresser MD Electronically signed by Buford Dresser MD Signature Date/Time: 10/31/2021/10:16:09 AM    Final      Labs:   Basic Metabolic Panel: Recent Labs  Lab 10/30/21 1629 10/31/21 0622 11/01/21 0158  NA 140 138 140  K 5.2* 5.3* 4.3  CL 112* 111 113*  CO2 21* 18* 16*  GLUCOSE 122* 148* 139*  BUN 33* 28* 27*  CREATININE 1.97* 1.79* 1.77*  CALCIUM 9.0 8.5* 8.6*   GFR Estimated Creatinine Clearance: 34.8 mL/min (A) (by C-G formula based on SCr of 1.77 mg/dL (H)). Liver Function Tests: Recent Labs  Lab 10/30/21 1629  AST 33  ALT 23  ALKPHOS 53  BILITOT 1.4*  PROT 6.8  ALBUMIN 3.7   No results for input(s): LIPASE, AMYLASE in the last 168 hours. No results for input(s): AMMONIA in the last 168 hours. Coagulation profile Recent Labs  Lab 10/30/21 1701  INR 1.5*    CBC: Recent Labs  Lab 10/30/21 1629 11/01/21 0158  WBC 6.6 9.0  NEUTROABS 4.1 6.8  HGB 11.7* 11.6*  HCT 35.2* 35.8*  MCV 97.0 97.8  PLT 147* 145*   Cardiac Enzymes: No results for input(s): CKTOTAL, CKMB, CKMBINDEX, TROPONINI in the last 168 hours. BNP: Invalid input(s): POCBNP CBG: Recent Labs  Lab 10/31/21 1222 10/31/21 1800 10/31/21 2338 11/01/21 0741 11/01/21 1100  GLUCAP 116* 124* 104* 189* 193*   D-Dimer No results for input(s): DDIMER in the last 72 hours. Hgb A1c Recent Labs    10/31/21 0622  HGBA1C 7.4*   Lipid Profile No results for input(s): CHOL, HDL, LDLCALC, TRIG, CHOLHDL, LDLDIRECT in the last 72 hours. Thyroid function studies Recent Labs    10/30/21 1629  TSH 2.583   Anemia work up Recent Labs    10/31/21 0622  VITAMINB12 208  FOLATE 28.5  FERRITIN 115  TIBC 314  IRON 68  RETICCTPCT 1.5   Microbiology Recent Results (from the past 240 hour(s))   Resp Panel by RT-PCR (Flu A&B, Covid) Nasopharyngeal Swab     Status: None   Collection Time: 10/30/21  5:01 PM   Specimen: Nasopharyngeal Swab; Nasopharyngeal(NP) swabs in vial transport medium  Result Value Ref Range Status   SARS Coronavirus 2 by RT PCR NEGATIVE NEGATIVE Final    Comment: (NOTE) SARS-CoV-2 target nucleic acids are NOT DETECTED.  The SARS-CoV-2 RNA is generally detectable in upper respiratory specimens during the acute phase of infection. The lowest concentration of SARS-CoV-2 viral copies this assay can detect is 138 copies/mL. A negative result does not preclude SARS-Cov-2 infection and should not be used as the sole basis for treatment or other patient management decisions. A negative result may occur with  improper specimen collection/handling, submission of specimen other than nasopharyngeal swab, presence of viral mutation(s) within the areas targeted by this assay, and inadequate number of viral copies(<138 copies/mL). A negative result must be combined with clinical observations, patient history, and epidemiological information. The expected result is Negative.  Fact Sheet  for Patients:  EntrepreneurPulse.com.au  Fact Sheet for Healthcare Providers:  IncredibleEmployment.be  This test is no t yet approved or cleared by the [place] FDA and  has been authorized for detection and/or diagnosis of SARS-CoV-2 by FDA under an Emergency Use Authorization (EUA). This EUA will remain  in effect (meaning this test can be used) for the duration of the COVID-19 declaration under Section 564(b)(1) of the Act, 21 U.S.C.section 360bbb-3(b)(1), unless the authorization is terminated  or revoked sooner.       Influenza A by PCR NEGATIVE NEGATIVE Final   Influenza B by PCR NEGATIVE NEGATIVE Final    Comment: (NOTE) The Xpert Xpress SARS-CoV-2/FLU/RSV plus assay is intended as an aid in the diagnosis of influenza from  Nasopharyngeal swab specimens and should not be used as a sole basis for treatment. Nasal washings and aspirates are unacceptable for Xpert Xpress SARS-CoV-2/FLU/RSV testing.  Fact Sheet for Patients: EntrepreneurPulse.com.au  Fact Sheet for Healthcare Providers: IncredibleEmployment.be  This test is not yet approved or cleared by the [place] FDA and has been authorized for detection and/or diagnosis of SARS-CoV-2 by FDA under an Emergency Use Authorization (EUA). This EUA will remain in effect (meaning this test can be used) for the duration of the COVID-19 declaration under Section 564(b)(1) of the Act, 21 U.S.C. section 360bbb-3(b)(1), unless the authorization is terminated or revoked.  Performed at [facility], [place] [address] [place], [place] [address]   Culture, blood (routine x 2)     Status: None (Preliminary result)   Collection Time: 10/31/21  5:52 AM   Specimen: BLOOD  Result Value Ref Range Status   Specimen Description BLOOD RIGHT ANTECUBITAL  Final   Special Requests   Final    BOTTLES DRAWN AEROBIC AND ANAEROBIC Blood Culture results may not be optimal due to an inadequate volume of blood received in culture bottles   Culture   Final    NO GROWTH < 24 HOURS Performed at [facility], [place] [address] [place], [place] [address]    Report Status PENDING  Incomplete  Culture, blood (routine x 2)     Status: None (Preliminary result)   Collection Time: 10/31/21  6:20 AM   Specimen: BLOOD RIGHT ARM  Result Value Ref Range Status   Specimen Description BLOOD RIGHT ARM  Final   Special Requests   Final    BOTTLES DRAWN AEROBIC AND ANAEROBIC Blood Culture results may not be optimal due to an inadequate volume of blood received in culture bottles   Culture   Final    NO GROWTH < 24 HOURS Performed at [facility], [place] [address] [place], [place] [address]    Report Status PENDING  Incomplete  Surgical PCR  screen     Status: None   Collection Time: 10/31/21  2:00 PM   Specimen: Nasal Mucosa; Nasal Swab  Result Value Ref Range Status   MRSA, PCR NEGATIVE NEGATIVE Final   Staphylococcus aureus NEGATIVE NEGATIVE Final    Comment: (NOTE) The Xpert SA Assay (FDA approved for NASAL specimens in patients 63 years of age and older), is one component of a comprehensive surveillance program. It is not intended to diagnose infection nor to guide or monitor treatment. Performed at [facility], [place] [address] [place],  [address]      Signed: Terrilee Croak  [facility] 11/01/2021, 3:06 PM

## 2021-11-01 NOTE — Progress Notes (Signed)
Discharge instructions and follow up information gone over with pt and wife. Questions answered and resolved.   Rowland Lathe, RN  12.23.22 [phone]

## 2021-11-01 NOTE — TOC Initial Note (Signed)
Transition of Care [facility]) - Initial/Assessment Note    Patient Details  Name: Manuel Atkins MRN: [id number] Date of Birth: 1933-02-22  [facility]) CM/SW Contact:    Bethena Roys, RN Phone Number: 11/01/2021, 4:59 PM  Clinical Narrative: Case Manager spoke with family based on PT/OT recommendations. Patient declined SNF and wants to go home. Spouse just had surgery on her shoulder and is unable to care for the patient at this time. Son Locklan Canoy lives in [place] near [place] and is willing to have family stay with them for a while. Address for son is [address] [place] [address]. [facility] office unwilling to accept the patient at this time. Case Manager then reached out to [facility]. Liaison to call back with information if the office can service the patient for RN, PT,OT, Aide. Patient has durable medical equipment (DME), wheelchair, rolling walker at home. No other DME needs identified. Weekend Case Manager to follow up with [facility] Liaison [place].                 Expected Discharge Plan: [place] Barriers to Discharge: Continued Medical Work up   Patient Goals and CMS Choice Patient states their goals for this hospitalization and ongoing recovery are:: to return home with home health services.   Choice offered to / list presented to : Spouse, Adult Children  Expected Discharge Plan and Services Expected Discharge Plan: [place] In-house Referral: NA Discharge Planning Services: CM Consult Post Acute Care Choice: [place] arrangements for the past 2 months: Single Family Home Expected Discharge Date: 11/01/21                 DME Agency: NA       HH Arranged: RN, Disease Management, PT, OT, Nurse's Aide [place] Agency: [place] ([place] declined in [place] and [facility].) Date HH Agency Contacted: 11/01/21 Time HH Agency Contacted: 106 Representative spoke with at  [place]: [place] Arrangements/Services Living arrangements for the past 2 months: [place] with:: Spouse (will have support of adult son) Patient language and need for interpreter reviewed:: Yes Do you feel safe going back to the place where you live?: Yes      Need for Family Participation in Patient Care: Yes (Comment) Care giver support system in place?: Yes (comment)   Criminal Activity/Legal Involvement Pertinent to Current Situation/Hospitalization: No - Comment as needed  Activities of Daily Living      Permission Sought/Granted Permission sought to share information with : Family Supports, Customer service manager, Case Optician, dispensing granted to share information with : Yes, Verbal Permission Granted     Permission granted to share info w AGENCY: [facility]        Emotional Assessment Appearance:: Appears stated age Attitude/Demeanor/Rapport: Engaged Affect (typically observed): Appropriate Orientation: : Oriented to Situation, Oriented to  Time, Oriented to Place, Oriented to Self Alcohol / Substance Use: Not Applicable Psych Involvement: No (comment)  Admission diagnosis:  Bradycardia [R00.1] Exertional dyspnea [R06.09] Hypertensive urgency [I16.0] Symptomatic bradycardia [R00.1] Community acquired pneumonia, unspecified laterality [J18.9] Patient Active Problem List   Diagnosis Date Noted   AV block 10/31/2021   Aspiration pneumonia ([place]) 10/31/2021   Hypertensive urgency 10/31/2021   Bradycardia 10/30/2021   Healthcare maintenance 10/01/2020   History of pulmonary embolus (PE) 09/26/2019   GERD (gastroesophageal reflux disease) 07/19/2019   OSA (obstructive sleep  apnea) 06/23/2019   Exertional chest pain 04/17/2018   ILD (interstitial lung disease) ([place]) 01/15/2018   Hoarseness 01/15/2018   Dyspnea 01/15/2018   Esophageal stricture 12/11/2017   Dysphagia 05/24/2013   Hypocalcemia 11/15/2012   Numbness  11/15/2012   Overweight(278.02) 11/10/2012   Thrombocytopenia ([place]) 11/10/2012   Anemia 11/10/2012   Intractable diarrhea 11/08/2012   Ulcerative colitis ([place]) 11/08/2012   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   Dehydration 11/08/2012   Fever and chills 09/27/2012   Type 2 diabetes mellitus with hyperlipidemia ([place]) 08/20/2011   Mixed hyperlipidemia    Essential hypertension, benign    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

## 2021-11-01 NOTE — Progress Notes (Signed)
Pt had kidney stone from prior visit, feels pain and frequency as if he is passing the stone now. Contacted Dr. Pietro Cassis, obtained pain medication and pt had some relief.   Rowland Lathe, RN 12.23.22 [phone]

## 2021-11-01 NOTE — Evaluation (Signed)
Physical Therapy Evaluation Patient Details Name: Manuel Atkins MRN: [id number] DOB: September 28, 1933 Today's Date: 11/01/2021  History of Present Illness  Manuel Atkins is a 87 y.o. male with medical history significant of ILD, hypertension, GERD, hyperlipidemia, OSA on BiPAP, type 2 diabetes, ulcerative enterocolitis, CMV colitis, BPH, CKD stage III sent to the ED from his pulmonologist office for evaluation of symptomatic bradycardia.  Now s/p Pacemaker insertion on 10/31/21.  Clinical Impression  Patient presents with decreased mobility due to general weakness, decreased balance (with baseline falls at home), decreased L UE use due to pacemaker insertion, decreased activity tolerance and decreased safety awareness.  Currently mod to min A for transfers and ambulation in hallway.  Wife present and feels between she and her son they can assist to keep him safe.  They have transport wheelchair, rollator walker, lift chair, and all needed equipment.  Patient refusing SNF.  Feel could go home with family support and follow up HHPT/aide.  PT will follow up if not d/c.        Recommendations for follow up therapy are one component of a multi-disciplinary discharge planning process, led by the attending physician.  Recommendations may be updated based on patient status, additional functional criteria and insurance authorization.  Follow Up Recommendations Home health PT    Assistance Recommended at Discharge Frequent or constant Supervision/Assistance  Functional Status Assessment Patient has had a recent decline in their functional status and demonstrates the ability to make significant improvements in function in a reasonable and predictable amount of time.  Equipment Recommendations  None recommended by PT    Recommendations for Other Services       Precautions / Restrictions Precautions Precautions: Fall;ICD/Pacemaker Precaution Comments: Educated on pacemaker precautions Required Braces or  Orthoses: Sling Restrictions LUE Weight Bearing: Non weight bearing Other Position/Activity Restrictions: used sling for bed mobility, removed for ambulation      Mobility  Bed Mobility Overal bed mobility: Needs Assistance Bed Mobility: Supine to Sit     Supine to sit: Mod assist;HOB elevated     General bed mobility comments: assist for lifting trunk and scooting hips    Transfers Overall transfer level: Needs assistance Equipment used: Rolling walker (2 wheels) Transfers: Sit to/from Stand Sit to Stand: From elevated surface;Mod assist           General transfer comment: slight elevation to height of bed to simulate home height, min to mod A due to mild posterior bias initial standing and pt not using L UE    Ambulation/Gait Ambulation/Gait assistance: Min assist Gait Distance (Feet): 100 Feet Assistive device: Rolling walker (2 wheels) Gait Pattern/deviations: Step-through pattern;Decreased stride length;Wide base of support;Drifts right/left;Trunk flexed       General Gait Details: assist/cues for walker safety due to pt getting out of center of walker , increased time for turns and min A for balance  Stairs            Wheelchair Mobility    Modified Rankin (Stroke Patients Only)       Balance Overall balance assessment: Needs assistance Sitting-balance support: Feet supported Sitting balance-Leahy Scale: Fair     Standing balance support: Bilateral upper extremity supported Standing balance-Leahy Scale: Poor Standing balance comment: UE support and minguard for static balance                             Pertinent Vitals/Pain Pain Assessment: Faces Faces Pain Scale: Hurts little  more Pain Location: L chest at [facility] site Pain Descriptors / Indicators: Sore Pain Intervention(s): Monitored during session    Home Living Family/patient expects to be discharged to:: Private residence Living Arrangements: Spouse/significant  other Available Help at Discharge: Family;Available 24 hours/day Type of Home: House Home Access: Stairs to enter   [employer] of Steps: 1   Home Layout: One level Home Equipment: Conservation officer, nature (2 wheels);Cane - single point;Shower seat;Grab bars - tub/shower;Grab bars - toilet;Hand held shower head;Wheelchair - Education administrator (comment) Additional Comments: transport w/c and lift chair    Prior Function Prior Level of Function : Independent/Modified Independent             Mobility Comments: uses walker, about 6 falls in 6 months sometimes with walker or sometime without       Hand Dominance   Dominant Hand: Right    Extremity/Trunk Assessment   Upper Extremity Assessment Upper Extremity Assessment: LUE deficits/detail LUE Deficits / Details: stiffness noted, limited ROM based on precautions s/p PPM    Lower Extremity Assessment Lower Extremity Assessment: Generalized weakness    Cervical / Trunk Assessment Cervical / Trunk Assessment: Kyphotic  Communication   Communication: No difficulties  Cognition Arousal/Alertness: Awake/alert Behavior During Therapy: WFL for tasks assessed/performed Overall Cognitive Status: Impaired/Different from baseline Area of Impairment: Problem solving;Orientation                 Orientation Level: Situation           Problem Solving: Slow processing;Requires verbal cues;Difficulty sequencing General Comments: increased time to describe his reason for being here stumbling over words        General Comments General comments (skin integrity, edema, etc.): wife in the room, discussed options for home versus to SNF (pt refuses) versus to son's home,  Wife prefers him home, but discussed risks if pt falls and need for help for transfers (though he has a lift chair at home) and discussed son in freqently to help (but he lives in [place]).    Exercises     Assessment/Plan    PT Assessment Patient needs continued  PT services  PT Problem List Decreased strength;Decreased mobility;Decreased safety awareness;Decreased coordination;Decreased activity tolerance;Decreased cognition;Decreased balance;Decreased knowledge of use of DME;Pain       PT Treatment Interventions DME instruction;Therapeutic activities;Patient/family education;Therapeutic exercise;Gait training;Balance training;Functional mobility training    PT Goals (Current goals can be found in the Care Plan section)  Acute Rehab PT Goals Patient Stated Goal: to go home or to son's home PT Goal Formulation: With patient/family Time For Goal Achievement: 11/15/21 Potential to Achieve Goals: Good    Frequency Min 3X/week   Barriers to discharge        Co-evaluation               AM-PAC PT "6 Clicks" Mobility  Outcome Measure Help needed turning from your back to your side while in a flat bed without using bedrails?: A Little Help needed moving from lying on your back to sitting on the side of a flat bed without using bedrails?: A Lot Help needed moving to and from a bed to a chair (including a wheelchair)?: A Lot Help needed standing up from a chair using your arms (e.g., wheelchair or bedside chair)?: A Lot Help needed to walk in hospital room?: A Little Help needed climbing 3-5 steps with a railing? : Total 6 Click Score: 13    End of Session Equipment Utilized During Treatment: Gait belt Activity Tolerance:  Patient tolerated treatment well Patient left: in chair;with call bell/phone within reach;with family/visitor present   PT Visit Diagnosis: Other abnormalities of gait and mobility (R26.89);History of falling (Z91.81);Muscle weakness (generalized) (M62.81)    Time: 1427-6701 PT Time Calculation (min) (ACUTE ONLY): 32 min   Charges:   PT Evaluation $PT Eval Moderate Complexity: 1 Mod PT Treatments $Gait Training: 8-22 mins        Magda Kiel, PT Acute Rehabilitation  Services TYYPE:[phone] [phone] 11/01/2021   Reginia Naas 11/01/2021, 3:27 PM

## 2021-11-01 NOTE — Evaluation (Signed)
Occupational Therapy Evaluation Patient Details Name: Manuel Atkins MRN: [id number] DOB: 06-Nov-1933 Today's Date: 11/01/2021   History of Present Illness 87 y.o. male sent to the ED from his pulmonologist office for evaluation of symptomatic bradycardia.  s/p Pacemaker insertion on 10/31/21. PMH including ILD, HTN, GERD, hyperlipidemia, OSA on BiPAP, type 2 diabetes, ulcerative enterocolitis, CMV colitis, BPH, and CKD stage III.   Clinical Impression   PTA, pt was living with his wife and was performing ADLs and using RW for mobility. Pt currently requiring Min-Mod A for UB ADLs, Mod-Max A for LB ADLs, and Min-Mod A for functional mobility with RW.  Pt presenting with decreased balance, strength, cognition, and activity tolerance. Pt with several moments of LOB requiring Min-Mod A for correction. Discussing need for hands on help at home when up and moving. Recommending using depends at home. Pt would benefit from further acute OT to facilitate safe dc. Recommend dc to home with HHOT for further OT to optimize safety, independence with ADLs, and return to PLOF.      Recommendations for follow up therapy are one component of a multi-disciplinary discharge planning process, led by the attending physician.  Recommendations may be updated based on patient status, additional functional criteria and insurance authorization.   Follow Up Recommendations  Home health OT    Assistance Recommended at Discharge Frequent or constant Supervision/Assistance  Functional Status Assessment  Patient has had a recent decline in their functional status and demonstrates the ability to make significant improvements in function in a reasonable and predictable amount of time.  Equipment Recommendations  None recommended by OT    Recommendations for Other Services PT consult     Precautions / Restrictions Precautions Precautions: Fall;ICD/Pacemaker Precaution Comments: Educating on pacemarker precaution - no  pushing, pulling, and raising LUE above 90*. Pt not recalling precautions from PT session earlier Required Braces or Orthoses: Sling Restrictions LUE Weight Bearing: Non weight bearing Other Position/Activity Restrictions: used sling for bed mobility, removed for ambulation      Mobility Bed Mobility Overal bed mobility: Needs Assistance Bed Mobility: Supine to Sit     Supine to sit: Mod assist;HOB elevated     General bed mobility comments: In recliner upon arrival    Transfers Overall transfer level: Needs assistance Equipment used: Rolling walker (2 wheels) Transfers: Sit to/from Stand Sit to Stand: Mod assist           General transfer comment: Mod A to power up into standing. Poor recall of precautions      Balance Overall balance assessment: Needs assistance Sitting-balance support: Feet supported Sitting balance-Leahy Scale: Fair     Standing balance support: Bilateral upper extremity supported Standing balance-Leahy Scale: Poor Standing balance comment: UE support and minguard for static balance                           ADL either performed or assessed with clinical judgement   ADL Overall ADL's : Needs assistance/impaired Eating/Feeding: Set up;Sitting   Grooming: Oral care;Supervision/safety;Min guard;Standing Grooming Details (indicate cue type and reason): Pt performing oral care at sink with Supervision-Min Guard A for safety. Pt with increased fatigue Upper Body Bathing: Minimal assistance;Sitting   Lower Body Bathing: Moderate assistance;Sit to/from stand   Upper Body Dressing : Moderate assistance;Sitting Upper Body Dressing Details (indicate cue type and reason): Educating pt on compensatory techniques for UB ADLs Lower Body Dressing: Maximal assistance;Sit to/from stand Lower Body Dressing Details (indicate  cue type and reason): doffing/donning socks Toilet Transfer: Moderate assistance Toilet Transfer Details (indicate cue type  and reason): Mod A to power up into standing         Functional mobility during ADLs: Moderate assistance;Minimal assistance;Rolling walker (2 wheels) General ADL Comments: Pt with decreased balance and safety. Several moments of LOB - posteriorly and to R. Poor awarenessof LOB and ability to correct. Requiring atleast Mod A for corrections.     Vision         Perception     Praxis      Pertinent Vitals/Pain Pain Assessment: Faces Faces Pain Scale: Hurts little more Pain Location: L chest at PPM site Pain Descriptors / Indicators: Sore Pain Intervention(s): Monitored during session;Limited activity within patient's tolerance;Repositioned     Hand Dominance Right   Extremity/Trunk Assessment Upper Extremity Assessment Upper Extremity Assessment: LUE deficits/detail LUE Deficits / Details: edema. limited ROM per precautions LUE Coordination: decreased gross motor   Lower Extremity Assessment Lower Extremity Assessment: Generalized weakness   Cervical / Trunk Assessment Cervical / Trunk Assessment: Kyphotic   Communication Communication Communication: No difficulties   Cognition Arousal/Alertness: Awake/alert Behavior During Therapy: WFL for tasks assessed/performed Overall Cognitive Status: Impaired/Different from baseline Area of Impairment: Problem solving;Following commands;Awareness;Safety/judgement;Memory                 Orientation Level: Situation   Memory: Decreased recall of precautions (PT session 30 minutes prior; pt not recalling precautions) Following Commands: Follows one step commands with increased time;Follows one step commands inconsistently Safety/Judgement: Decreased awareness of safety;Decreased awareness of deficits Awareness: Intellectual;Emergent Problem Solving: Slow processing;Requires verbal cues;Difficulty sequencing General Comments: Pt with poor awareness of deficits and LOB. no recall of precautions. reuqiring cues for safety  and problem solving     General Comments  Wife present throughout. bowel incontinence at sink    Exercises     Shoulder Instructions      Home Living Family/patient expects to be discharged to:: Private residence Living Arrangements: Spouse/significant other Available Help at Discharge: Family;Available 24 hours/day Type of Home: House Home Access: Stairs to enter [employer] of Steps: 1   Home Layout: One level     Bathroom Shower/Tub: Occupational psychologist:  (comfort height)     Home Equipment: Conservation officer, nature (2 wheels);Cane - single point;Shower seat;Grab bars - tub/shower;Grab bars - toilet;Hand held shower head;Wheelchair - Education administrator (comment) (transport w/c and lift chair)   Additional Comments: Wife mentioning possible plan for patient to stay with son at dc for increased support      Prior Functioning/Environment Prior Level of Function : Independent/Modified Independent             Mobility Comments: uses walker, about 6 falls in 6 months sometimes with walker or sometime without ADLs Comments: performs BADLs        OT Problem List: Decreased range of motion;Decreased strength;Decreased activity tolerance;Impaired balance (sitting and/or standing);Decreased knowledge of use of DME or AE;Decreased knowledge of precautions;Decreased cognition;Decreased safety awareness      OT Treatment/Interventions: Therapeutic exercise;Self-care/ADL training;Energy conservation;Patient/family education    OT Goals(Current goals can be found in the care plan section) Acute Rehab OT Goals Patient Stated Goal: To go home OT Goal Formulation: With patient/family Time For Goal Achievement: 11/15/21 Potential to Achieve Goals: Good  OT Frequency: Min 3X/week   Barriers to D/C:            Co-evaluation  AM-PAC OT "6 Clicks" Daily Activity     Outcome Measure Help from another person eating meals?: None Help from another  person taking care of personal grooming?: A Little Help from another person toileting, which includes using toliet, bedpan, or urinal?: A Lot Help from another person bathing (including washing, rinsing, drying)?: A Lot Help from another person to put on and taking off regular upper body clothing?: A Lot Help from another person to put on and taking off regular lower body clothing?: A Lot 6 Click Score: 15   End of Session Equipment Utilized During Treatment: Gait belt;Rolling walker (2 wheels) Nurse Communication: Mobility status  Activity Tolerance: Patient tolerated treatment well Patient left: in chair;with call bell/phone within reach;with family/visitor present  OT Visit Diagnosis: Unsteadiness on feet (R26.81);Other abnormalities of gait and mobility (R26.89);Muscle weakness (generalized) (M62.81)                Time: 2620-3559 OT Time Calculation (min): 41 min Charges:  OT General Charges $OT Visit: 1 Visit OT Evaluation $OT Eval Moderate Complexity: 1 Mod OT Treatments $Self Care/Home Management : 23-37 mins  [place], OTR/L Acute Rehab Pager: [phone] Office: [place] 11/01/2021, 3:51 PM

## 2021-11-01 NOTE — Progress Notes (Addendum)
Progress Note  Patient Name: Manuel Atkins Date of Encounter: 11/01/2021  [facility] Cardiologist: Dr. Virgina Jock (last 2019), new to [facility]  Subjective   "I'm OK", tough to sleep, mild aching at pacer site, hungry  Inpatient Medications    Scheduled Meds:  imipramine  75 mg Oral QHS   insulin aspart  0-5 Units Subcutaneous QHS   insulin aspart  0-9 Units Subcutaneous TID WC   pantoprazole  40 mg Oral Daily   rosuvastatin  10 mg Oral QPM   sodium chloride flush  3 mL Intravenous Q12H   tamsulosin  0.4 mg Oral QHS   Continuous Infusions:  ampicillin-sulbactam (UNASYN) IV 3 g (11/01/21 0655)   PRN Meds: acetaminophen **OR** acetaminophen, acetaminophen, hydrALAZINE, ondansetron (ZOFRAN) IV, zolpidem   Vital Signs    Vitals:   10/31/21 1724 10/31/21 2041 10/31/21 2133 11/01/21 0410  BP:  (!) 173/70 (!) 196/72 (!) 170/76  Pulse: 79 77  89  Resp: 17 20  19   Temp:  97.8 F (36.6 C)  97.8 F (36.6 C)  TempSrc:  Oral  Oral  SpO2: 96% 98%  95%  Weight:      Height:        Intake/Output Summary (Last 24 hours) at 11/01/2021 0755 Last data filed at 11/01/2021 0655 Gross per 24 hour  Intake 639.66 ml  Output --  Net 639.66 ml   Last 3 Weights 10/30/2021 10/30/2021 06/28/2021  Weight (lbs) 204 lb 206 lb 12.8 oz 204 lb 6.4 oz  Weight (kg) 92.534 kg 93.804 kg 92.715 kg      Telemetry    SR/ V paced - Personally Reviewed  ECG    SR/V paced, QRS 32m - Personally Reviewed  Physical Exam   GEN: No acute distress.   Neck: No JVD Cardiac: RRR, no murmurs, rubs, or gallops.  Respiratory: soft crackles at the bases, otherwise CTA b/l. GI: Soft, nontender, non-distended  MS: No edema; No deformity. Neuro:  Nonfocal  Psych: Normal affect   Pacer site is stable, no hematoma  Labs    High Sensitivity Troponin:   Recent Labs  Lab 10/30/21 1629 10/30/21 1829  TROPONINIHS 16 13     Chemistry Recent Labs  Lab 10/30/21 1629 10/31/21 0622 11/01/21 0158   NA 140 138 140  K 5.2* 5.3* 4.3  CL 112* 111 113*  CO2 21* 18* 16*  GLUCOSE 122* 148* 139*  BUN 33* 28* 27*  CREATININE 1.97* 1.79* 1.77*  CALCIUM 9.0 8.5* 8.6*  PROT 6.8  --   --   ALBUMIN 3.7  --   --   AST 33  --   --   ALT 23  --   --   ALKPHOS 53  --   --   BILITOT 1.4*  --   --   GFRNONAA 32* 36* 37*  ANIONGAP 7 9 11     Lipids No results for input(s): CHOL, TRIG, HDL, LABVLDL, LDLCALC, CHOLHDL in the last 168 hours.  Hematology Recent Labs  Lab 10/30/21 1629 10/31/21 0622 11/01/21 0158  WBC 6.6  --  9.0  RBC 3.63* 3.81* 3.66*  HGB 11.7*  --  11.6*  HCT 35.2*  --  35.8*  MCV 97.0  --  97.8  MCH 32.2  --  31.7  MCHC 33.2  --  32.4  RDW 12.9  --  13.1  PLT 147*  --  145*   Thyroid  Recent Labs  Lab 10/30/21 1629  TSH 2.583  FREET4 1.03    BNPNo results for input(s): BNP, PROBNP in the last 168 hours.  DDimer No results for input(s): DDIMER in the last 168 hours.   Radiology      Cardiac Studies   10/31/21: TTE IMPRESSIONS   1. Left ventricular ejection fraction, by estimation, is 70 to 75%. The  left ventricle has hyperdynamic function. The left ventricle has no  regional wall motion abnormalities. Left ventricular diastolic parameters  were normal.   2. Right ventricular systolic function is normal. The right ventricular  size is normal. There is mildly elevated pulmonary artery systolic  pressure.   3. The mitral valve is grossly normal. No evidence of mitral valve  regurgitation. No evidence of mitral stenosis. Moderate mitral annular  calcification.   4. The aortic valve has an indeterminant number of cusps. There is mild  calcification of the aortic valve. There is mild thickening of the aortic  valve. Aortic valve regurgitation is mild. Mild aortic valve stenosis.   5. The inferior vena cava is normal in size with <50% respiratory  variability, suggesting right atrial pressure of 8 mmHg.   Patient Profile     87 y.o. male with a hx of  DM, ulcerative colitis, HTN, RBBB, GERD, abn LFTs, ILD/bronchiectasis, CKD (III),  hx PE on Eliquis at home, depression, OSA w/BiPAP, BPH admitted with fatigue, SOB, found in advanced heart block including CHB  Assessment & Plan    CHB Baseline conduction system disease  Now s/p PPM yesterday with Dr. Lovena Le Device check this AM with stable measurements CXR this AM without ptx, some congestion Site is stable wound care and activity restrictions d/w the patient Routine post pacer follow up is in place  I will order single dose of IV lasix for him, AV conduction will also help some pulm vascular congestion and his BP I will order Toprol instead of his losartan given CKD (looks at about his baseline)   3. Hx of PE On eliquis out pt DO NOT resume until Tuesday 11/05/21 at his evening dose SCDs while here   4. ?PNA On antibiotics Afebrile, WBC is wnl C/w IM/attending   5. Mild hyperkalemia resolved Deferred to attending IM  Dr. Lovena Le has seen and examined the patient, OK to discharge from EP perspective when ready medically otherwise  For questions or updates, please contact [facility] Please consult [email or web address] for contact info under   Signed, Baldwin Jamaica, PA-C  11/01/2021, 7:55 AM    EP Attending  Patient seen and examined. Agree with the findings as noted above. The patient is doing well after PPM insertion. He denies chest pain or sob. His CXR looks good. His PPM interrogation under my direction demonstrates normal DDD PM function and his paced QRS duration is less than a 100. He can be discharged home with the usual followup.'  Salome Spotted

## 2021-11-02 DIAGNOSIS — Z950 Presence of cardiac pacemaker: Secondary | ICD-10-CM

## 2021-11-02 DIAGNOSIS — E785 Hyperlipidemia, unspecified: Secondary | ICD-10-CM

## 2021-11-02 DIAGNOSIS — E1169 Type 2 diabetes mellitus with other specified complication: Secondary | ICD-10-CM

## 2021-11-02 DIAGNOSIS — J189 Pneumonia, unspecified organism: Secondary | ICD-10-CM

## 2021-11-02 LAB — BASIC METABOLIC PANEL
Anion gap: 8 (ref 5–15)
BUN: 22 mg/dL (ref 8–23)
CO2: 20 mmol/L — ABNORMAL LOW (ref 22–32)
Calcium: 8.4 mg/dL — ABNORMAL LOW (ref 8.9–10.3)
Chloride: 110 mmol/L (ref 98–111)
Creatinine, Ser: 1.8 mg/dL — ABNORMAL HIGH (ref 0.61–1.24)
GFR, Estimated: 36 mL/min — ABNORMAL LOW (ref >60)
Glucose, Bld: 130 mg/dL — ABNORMAL HIGH (ref 70–99)
Potassium: 3.8 mmol/L (ref 3.5–5.1)
Sodium: 138 mmol/L (ref 135–145)

## 2021-11-02 LAB — CBC WITH DIFFERENTIAL/PLATELET
Abs Immature Granulocytes: 0.03 10*3/uL (ref 0.00–0.07)
Basophils Absolute: 0.1 10*3/uL (ref 0.0–0.1)
Basophils Relative: 1 %
Eosinophils Absolute: 0.1 10*3/uL (ref 0.0–0.5)
Eosinophils Relative: 2 %
HCT: 31.2 % — ABNORMAL LOW (ref 39.0–52.0)
Hemoglobin: 10.4 g/dL — ABNORMAL LOW (ref 13.0–17.0)
Immature Granulocytes: 0 %
Lymphocytes Relative: 17 %
Lymphs Abs: 1.3 10*3/uL (ref 0.7–4.0)
MCH: 31.4 pg (ref 26.0–34.0)
MCHC: 33.3 g/dL (ref 30.0–36.0)
MCV: 94.3 fL (ref 80.0–100.0)
Monocytes Absolute: 1.1 10*3/uL — ABNORMAL HIGH (ref 0.1–1.0)
Monocytes Relative: 15 %
Neutro Abs: 4.9 10*3/uL (ref 1.7–7.7)
Neutrophils Relative %: 65 %
Platelets: 133 10*3/uL — ABNORMAL LOW (ref 150–400)
RBC: 3.31 MIL/uL — ABNORMAL LOW (ref 4.22–5.81)
RDW: 13.1 % (ref 11.5–15.5)
WBC: 7.5 10*3/uL (ref 4.0–10.5)
nRBC: 0 % (ref 0.0–0.2)

## 2021-11-02 LAB — GLUCOSE, CAPILLARY
Glucose-Capillary: 111 mg/dL — ABNORMAL HIGH (ref 70–99)
Glucose-Capillary: 125 mg/dL — ABNORMAL HIGH (ref 70–99)
Glucose-Capillary: 234 mg/dL — ABNORMAL HIGH (ref 70–99)

## 2021-11-02 MED ORDER — AMLODIPINE BESYLATE 5 MG PO TABS: 5.0000 mg | ORAL_TABLET | Freq: Every day | ORAL | 11 refills | Status: DC

## 2021-11-02 MED ORDER — AMOXICILLIN-POT CLAVULANATE 875-125 MG PO TABS
1.0000 | ORAL_TABLET | Freq: Two times a day (BID) | ORAL | Status: DC
  Administered 2021-11-02: 13:00:00 1 via ORAL
  Filled 2021-11-02: qty 1

## 2021-11-02 MED ORDER — CARVEDILOL 3.125 MG PO TABS: 3.1250 mg | ORAL_TABLET | Freq: Two times a day (BID) | ORAL | 2 refills | Status: AC

## 2021-11-02 MED ORDER — APIXABAN 5 MG PO TABS
5.0000 mg | ORAL_TABLET | Freq: Two times a day (BID) | ORAL | Status: DC
  Filled 2021-11-02: qty 1

## 2021-11-02 MED ORDER — LOSARTAN POTASSIUM 25 MG PO TABS: 25.0000 mg | ORAL_TABLET | Freq: Every day | ORAL | 2 refills | Status: AC

## 2021-11-02 MED ORDER — AMOXICILLIN-POT CLAVULANATE 875-125 MG PO TABS: 1.0000 | ORAL_TABLET | Freq: Two times a day (BID) | ORAL | 0 refills | Status: AC

## 2021-11-02 NOTE — Discharge Summary (Addendum)
Physician Discharge Summary  Manuel Atkins UTM:[id number] DOB: 1933/03/12 DOA: 10/30/2021  PCP: [facility]  Admit date: 10/30/2021 Discharge date: 11/02/2021  Admitted From: Home Discharge disposition: Home with home health PT, OT, RN, aide.  Pt initially will go home with son in [place] for a few days.  Case Management unable to find an agency in [place] that will accept patient, but upon return home to [place], [facility] will follow up with pt.  Pt's PCP will set this up then.  New prescriptions sent to son's pharmacy in [place].    Code Status: DNR   Discharge Diagnosis:   Principal Problem:   Bradycardia Active Problems:   Anemia   AV block   Aspiration pneumonia ([place])   Hypertensive urgency    Chief Complaint  Patient presents with   Bradycardia   Brief narrative: Manuel Atkins is a 87 y.o. male with PMH significant for DM2, HTN, HLD, CKD 3, OSA on BiPAP, GERD, ulcerative enterocolitis, CMV colitis, BPH. Patient was having progressively worsening shortness of breath and weakness for the last few days and made an appointment with his pulmonologist.  Seen at [facility] pulmonology office yesterday 12/21 and was noted to be significantly bradycardic to 30s and 40s.  EKG in the office showed third-degree AV block.   EMS was called and patient was sent to the ED.  In the ED, his heart rate was 42, blood pressure significantly elevated to over 200 in multiple readings. Lab with potassium elevated to 5.2, BUN/creatinine elevated to 33/1.97 (baseline creatinine 1.6), WBC count normal, hemoglobin 11.7, TSH normal. Troponin negative x2, procalcitonin level normal Chest x-ray showed interval development of lingular/retrocardiac airspace opacity concerning for possible aspiration pneumonia.   CT head negative for acute finding.  ED physician discussed the case with on-call cardiologist who felt that the patient's symptoms were primarily due to aspiration pneumonia and not due  to bradycardia.   Patient was started on IV Rocephin, IV fluid  Admitted to hospitalist service   Subjective: Patient was seen and examined this morning.   Lying down in bed.  Not in distress.  Not on supplemental oxygen.  He is weak.  Wife at bedside.   Underwent permanent placement placement yesterday. Patient shows keen interest in going home.  He lives with his wife who is the sole care giver.  She is at bedside and she seems more interested in going to the rehab.  PT eval obtained.  Home with PT recommended.  Assessment/Plan: Symptomatic bradycardia  Third-degree AV block -Presented with fatigue, shortness of breath, bradycardia.   -EKG with third-degree AV block with heart rate in 30s and 40s  -Seen by cardiology.  Underwent permanent pacemaker placement 12/22.   Hypertensive urgency -Blood pressure consistently elevated to systolic above 568. -Per cardiology note, hypotension most likely due to complete heart block  -After pacemaker insertion, blood pressure still runs elevated to 170s.   -At discharge, start on Coreg 3.25 mg, amlodipine 5 mg daily and resume losartan at a lower dose -Continue to monitor blood pressure at home.  AKI on CKD 3b -Baseline creatinine less than 1.6 -Presented with a creatinine of 1.9.  Probably related to CHB and elevated blood pressure.  Gradually improving. -Continue to monitor as an outpatient. Recent Labs    05/12/21 1451 05/12/21 2137 06/28/21 1021 10/30/21 1629 10/31/21 0622 11/01/21 0158 11/02/21 0337  BUN 35* 34* 31* 33* 28* 27* 22  CREATININE 2.33* 2.11* 1.61* 1.97* 1.79* 1.77* 1.80*  History of ILD Possible aspiration pneumonia -Patient history of ILD.  Recent HRCT from 10/17/2021 did not any progression of disease.   -Chest x-ray done on admission showed interval development of lingular/retrocardiac airspace opacity concerning for possible aspiration pneumonia.   -COVID and influenza PCR negative.  No fever or leukocytosis.   Procalcitonin level negative -Empirically started on IV Unasyn on admission. -Speech evaluation appreciated.  Discharge on 3 more days of oral Augmentin.  Type 2 diabetes mellitus -A1c 7.4 on 12/22 -Home meds include glipizide 10 mg daily, metformin 500 mg in the morning and 1000 mg at night -Currently on SSI with Accu-Cheks.  Glipizide and metformin on hold.  Since his baseline creatinine is more than 1.4 all times, I would stop metformin.  Discussed with patient and wife.  Can resume glipizide post discharge. Recent Labs  Lab 11/01/21 1100 11/01/21 2213 11/02/21 0027 11/02/21 0923 11/02/21 1152  GLUCAP 193* 102* 111* 125* 234*     Hyperlipidemia -Continue Crestor  History of PE -Resume Eliquis on 12/27 as per Cardiology.   GERD -Continue Protonix   Anxiety/depression -Continue imipramine, Xanax as needed   OSA -Continue nightly BiPAP   BPH -Continue Flomax  Mobility: Encourage ambulation postprocedure Living condition: Lives at home with wife Goals of care:   Code Status: DNR per chart Nutritional status: Body mass index is 28.45 kg/m.     Discharge Medications:   Allergies as of 11/02/2021       Reactions   Mercaptopurine Other (See Comments)   High Fever, Chills, Fatigue        Medication List     STOP taking these medications    metFORMIN 500 MG 24 hr tablet Commonly known as: GLUCOPHAGE-XR       TAKE these medications    acetaminophen 500 MG tablet Commonly known as: TYLENOL Take 1,000 mg by mouth every 6 (six) hours as needed for moderate pain or headache.   ALPRAZolam 0.25 MG tablet Commonly known as: XANAX Take 0.5 tablets (0.125 mg total) by mouth 3 (three) times daily as needed for anxiety. for anxiety   amLODipine 5 MG tablet Commonly known as: NORVASC Take 1 tablet (5 mg total) by mouth daily.   amoxicillin-clavulanate 875-125 MG tablet Commonly known as: Augmentin Take 1 tablet by mouth 2 (two) times daily for 3 days.    carvedilol 3.125 MG tablet Commonly known as: Coreg Take 1 tablet (3.125 mg total) by mouth 2 (two) times daily.   diphenhydrAMINE 50 MG capsule Commonly known as: BENADRYL Take 50 mg by mouth at bedtime as needed for itching.   Eliquis 5 MG Tabs tablet Generic drug: apixaban TAKE ONE TABLET BY MOUTH TWICE DAILY   glipiZIDE 10 MG 24 hr tablet Commonly known as: GLUCOTROL XL Take 10 mg by mouth daily.   imipramine 25 MG tablet Commonly known as: TOFRANIL Take 75 mg by mouth at bedtime.   losartan 25 MG tablet Commonly known as: COZAAR Take 1 tablet (25 mg total) by mouth daily. What changed:  medication strength how much to take   Multivitamin Adults 50+ Tabs Take 1 tablet by mouth daily.   pantoprazole 40 MG tablet Commonly known as: PROTONIX TAKE ONE TABLET BY MOUTH EVERY *OTHER* DAY. What changed: See the new instructions.   PROBIOTIC DAILY PO Take 1 capsule by mouth daily.   Rocklatan 0.02-0.005 % Soln Generic drug: Netarsudil-Latanoprost Apply 1 drop to eye at bedtime.   rosuvastatin 10 MG tablet Commonly known as: CRESTOR Take 10  mg by mouth every evening.   tamsulosin 0.4 MG Caps capsule Commonly known as: FLOMAX Take 0.4 mg by mouth at bedtime.               Discharge Care Instructions  (From admission, onward)           Start     Ordered   11/01/21 0000  Discharge wound care:        11/01/21 1505            Wound care:       Discharge Instructions:   Discharge Instructions     Call MD for:  difficulty breathing, headache or visual disturbances   Complete by: As directed    Call MD for:  extreme fatigue   Complete by: As directed    Call MD for:  hives   Complete by: As directed    Call MD for:  persistant dizziness or light-headedness   Complete by: As directed    Call MD for:  persistant nausea and vomiting   Complete by: As directed    Call MD for:  severe uncontrolled pain   Complete by: As directed    Call  MD for:  temperature >100.4   Complete by: As directed    Diet - low sodium heart healthy   Complete by: As directed    Diet Carb Modified   Complete by: As directed    Discharge instructions   Complete by: As directed    General discharge instructions:  Follow with Primary MD [facility] in 7 days   Get CBC/BMP checked in next visit within 1 week by PCP or SNF MD. (We routinely change or add medications that can affect your baseline labs and fluid status, therefore we recommend that you get the mentioned basic workup next visit with your PCP, your PCP may decide not to get them or add new tests based on their clinical decision)  On your next visit with your PCP, please get your medicines reviewed and adjusted.  Please request your PCP  to go over all hospital tests, procedures, radiology results at the follow up, please get all Hospital records sent to your PCP by signing hospital release before you go home.  Activity: As tolerated with Full fall precautions use walker/cane & assistance as needed  Avoid using any recreational substances like cigarette, tobacco, alcohol, or non-prescribed drug.  If you experience worsening of your admission symptoms, develop shortness of breath, life threatening emergency, suicidal or homicidal thoughts you must seek medical attention immediately by calling 911 or calling your MD immediately  if symptoms less severe.  You must read complete instructions/literature along with all the possible adverse reactions/side effects for all the medicines you take and that have been prescribed to you. Take any new medicine only after you have completely understood and accepted all the possible adverse reactions/side effects.   Do not drive, operate heavy machinery, perform activities at heights, swimming or participation in water activities or provide baby sitting services if your were admitted for syncope or siezures until you have seen by Primary MD  or a Neurologist and advised to do so again.  Do not drive when taking Pain medications.  Do not take more than prescribed Pain, Sleep and Anxiety Medications  Wear Seat belts while driving.  Please note You were cared for by a hospitalist during your hospital stay. If you have any questions about your discharge medications or the care you received while  you were in the hospital after you are discharged, you can call the unit and asked to speak with the hospitalist on call if the hospitalist that took care of you is not available. Once you are discharged, your primary care physician will handle any further medical issues. Please note that NO REFILLS for any discharge medications will be authorized once you are discharged, as it is imperative that you return to your primary care physician (or establish a relationship with a primary care physician if you do not have one) for your aftercare needs so that they can reassess your need for medications and monitor your lab values.   Discharge wound care:   Complete by: As directed    Increase activity slowly   Complete by: As directed        Follow ups:    Follow-up Information     [facility] Follow up.   Contact information: [place] [id number] [phone]                 Discharge Exam:   Vitals:   11/02/21 0025 11/02/21 0040 11/02/21 0647 11/02/21 1153  BP: (!) 167/71 (!) 180/57 (!) 171/74 (!) 176/70  Pulse: 92 70 69 69  Resp: (!) 24 19 18 15   Temp:   98.7 F (37.1 C) 98 F (36.7 C)  TempSrc:   Oral Oral  SpO2: 97% 97% 100% 99%  Weight:      Height:        Body mass index is 28.45 kg/m.    General exam: Pleasant, elderly Caucasian male.  Not in physical distress Skin: No rashes, lesions or ulcers. HEENT: Atraumatic, normocephalic, no obvious bleeding Lungs: Clear to auscultation bilaterally CVS: Sinus bradycardia, no murmur GI/Abd soft, nontender, nondistended, bowel sound  present CNS: Alert, awake, oriented x3 Psychiatry: Mood appropriate Extremities: No pedal edema, no calf tenderness  Time coordinating discharge: 35 minutes   The results of significant diagnostics from this hospitalization (including imaging, microbiology, ancillary and laboratory) are listed below for reference.    Procedures and Diagnostic Studies:   CT HEAD WO CONTRAST (5MM)  Result Date: 10/30/2021 CLINICAL DATA:  Weakness and bradycardia with difficulty ambulating. EXAM: CT HEAD WITHOUT CONTRAST TECHNIQUE: Contiguous axial images were obtained from the base of the skull through the vertex without intravenous contrast. COMPARISON:  May 12, 2021 FINDINGS: Brain: There is mild cerebral atrophy with widening of the extra-axial spaces and ventricular dilatation. There are areas of decreased attenuation within the white matter tracts of the supratentorial brain, consistent with microvascular disease changes. Vascular: No hyperdense vessel or unexpected calcification. Skull: A chronic deformity of the right parietal region of the skull is noted. Sinuses/Orbits: No acute finding. Other: None. IMPRESSION: 1. Generalized cerebral atrophy. 2. No acute intracranial abnormality. Electronically Signed   By: Virgina Norfolk M.D.   On: 10/30/2021 21:15   EP PPM/ICD IMPLANT  Result Date: 10/31/2021 CONCLUSIONS:  1. Successful implantation of a Biotronik dual-chamber pacemaker for symptomatic bradycardia due to complete heart block  2. No early apparent complications.       Cristopher Peru, MD 10/31/2021 4:11 PM    DG Chest Port 1 View  Result Date: 10/30/2021 CLINICAL DATA:  Bradycardia EXAM: PORTABLE CHEST 1 VIEW COMPARISON:  Chest x-ray 12/24/2020 FINDINGS: Cardiac paddles overlie the chest. The heart and mediastinal contours are unchanged. Biapical pleural/pulmonary scarring. Interval development of lingular/retrocardiac airspace opacity. No pulmonary edema. No pleural effusion. No pneumothorax. No  acute osseous abnormality.  IMPRESSION: Interval development of lingular/retrocardiac airspace opacity. Findings may represent infection/inflammation (such as aspiration pneumonia). Electronically Signed   By: Iven Finn M.D.   On: 10/30/2021 17:20   ECHOCARDIOGRAM COMPLETE  Result Date: 10/31/2021    ECHOCARDIOGRAM REPORT   Patient Name:   Manuel Atkins Date of Exam: 10/31/2021 Medical Rec #:  [id number]     Height:       71.0 in Accession #:    [id number]    Weight:       204.0 lb Date of Birth:  09-22-1933      BSA:          2.126 m Patient Age:    87 years      BP:           205/49 mmHg Patient Gender: M             HR:           37 bpm. Exam Location:  Inpatient Procedure: 2D Echo, Cardiac Doppler, Color Doppler and Intracardiac            Opacification Agent STAT ECHO Indications:    Abnormal ECG R94.31  History:        Patient has prior history of Echocardiogram examinations, most                 recent 11/13/2017. Risk Factors:Hypertension, Diabetes, Sleep                 Apnea and Dyslipidemia. GERD.  Sonographer:    Darlina Sicilian RDCS Referring Phys: [id number] [place]  1. Left ventricular ejection fraction, by estimation, is 70 to 75%. The left ventricle has hyperdynamic function. The left ventricle has no regional wall motion abnormalities. Left ventricular diastolic parameters were normal.  2. Right ventricular systolic function is normal. The right ventricular size is normal. There is mildly elevated pulmonary artery systolic pressure.  3. The mitral valve is grossly normal. No evidence of mitral valve regurgitation. No evidence of mitral stenosis. Moderate mitral annular calcification.  4. The aortic valve has an indeterminant number of cusps. There is mild calcification of the aortic valve. There is mild thickening of the aortic valve. Aortic valve regurgitation is mild. Mild aortic valve stenosis.  5. The inferior vena cava is normal in size with <50% respiratory variability,  suggesting right atrial pressure of 8 mmHg. Comparison(s): Prior images unable to be directly viewed, comparison made by report only. No significant change from prior study. Conclusion(s)/Recommendation(s): Otherwise normal echocardiogram, with minor abnormalities described in the report. FINDINGS  Left Ventricle: Left ventricular ejection fraction, by estimation, is 70 to 75%. The left ventricle has hyperdynamic function. The left ventricle has no regional wall motion abnormalities. Definity contrast agent was given IV to delineate the left ventricular endocardial borders. The left ventricular internal cavity size was normal in size. There is no left ventricular hypertrophy. Left ventricular diastolic parameters were normal. Right Ventricle: The right ventricular size is normal. Right vetricular wall thickness was not well visualized. Right ventricular systolic function is normal. There is mildly elevated pulmonary artery systolic pressure. The tricuspid regurgitant velocity  is 2.81 m/s, and with an assumed right atrial pressure of 8 mmHg, the estimated right ventricular systolic pressure is 25.4 mmHg. Left Atrium: Left atrial size was normal in size. Right Atrium: Right atrial size was not well visualized. Pericardium: There is no evidence of pericardial effusion. Mitral Valve: The mitral valve is grossly normal. Moderate mitral annular calcification.  No evidence of mitral valve regurgitation. No evidence of mitral valve stenosis. Tricuspid Valve: The tricuspid valve is grossly normal. Tricuspid valve regurgitation is trivial. No evidence of tricuspid stenosis. Aortic Valve: The aortic valve has an indeterminant number of cusps. There is mild calcification of the aortic valve. There is mild thickening of the aortic valve. Aortic valve regurgitation is mild. Aortic regurgitation PHT measures 554 msec. Mild aortic stenosis is present. Aortic valve mean gradient measures 11.0 mmHg. Aortic valve peak gradient  measures 21.2 mmHg. Aortic valve area, by VTI measures 1.55 cm. Pulmonic Valve: The pulmonic valve was grossly normal. Pulmonic valve regurgitation is trivial. No evidence of pulmonic stenosis. Aorta: The aortic root and ascending aorta are structurally normal, with no evidence of dilitation. Venous: The inferior vena cava is normal in size with less than 50% respiratory variability, suggesting right atrial pressure of 8 mmHg. IAS/Shunts: The interatrial septum was not well visualized.  LEFT VENTRICLE PLAX 2D LVIDd:         5.70 cm      Diastology LVIDs:         3.10 cm      LV e' medial:    20.00 cm/s LV PW:         1.00 cm      LV E/e' medial:  6.6 LV IVS:        0.90 cm      LV e' lateral:   20.50 cm/s LVOT diam:     2.20 cm      LV E/e' lateral: 6.4 LV SV:         91 LV SV Index:   43 LVOT Area:     3.80 cm  LV Volumes (MOD) LV vol d, MOD A2C: 94.7 ml LV vol d, MOD A4C: 116.0 ml LV vol s, MOD A2C: 21.5 ml LV vol s, MOD A4C: 32.0 ml LV SV MOD A2C:     73.2 ml LV SV MOD A4C:     116.0 ml LV SV MOD BP:      78.8 ml RIGHT VENTRICLE RV S prime:     16.80 cm/s TAPSE (M-mode): 3.1 cm LEFT ATRIUM             Index LA diam:        3.90 cm 1.83 cm/m LA Vol (A2C):   36.3 ml 17.07 ml/m LA Vol (A4C):   37.6 ml 17.68 ml/m LA Biplane Vol: 37.6 ml 17.68 ml/m  AORTIC VALVE AV Area (Vmax):    1.77 cm AV Area (Vmean):   1.77 cm AV Area (VTI):     1.55 cm AV Vmax:           230.40 cm/s AV Vmean:          151.333 cm/s AV VTI:            0.588 m AV Peak Grad:      21.2 mmHg AV Mean Grad:      11.0 mmHg LVOT Vmax:         107.40 cm/s LVOT Vmean:        70.300 cm/s LVOT VTI:          0.239 m LVOT/AV VTI ratio: 0.41 AI PHT:            554 msec  AORTA Ao Asc diam: 3.60 cm MITRAL VALVE                TRICUSPID VALVE MV Area (PHT): 4.80 cm  TR Peak grad:   31.6 mmHg MV Decel Time: 158 msec     TR Vmax:        281.00 cm/s MV E velocity: 131.00 cm/s MV A velocity: 58.50 cm/s   SHUNTS MV E/A ratio:  2.24         Systemic VTI:   0.24 m                             Systemic Diam: 2.20 cm Buford Dresser MD Electronically signed by Buford Dresser MD Signature Date/Time: 10/31/2021/10:16:09 AM    Final      Labs:   Basic Metabolic Panel: Recent Labs  Lab 10/30/21 1629 10/31/21 0622 11/01/21 0158 11/02/21 0337  NA 140 138 140 138  K 5.2* 5.3* 4.3 3.8  CL 112* 111 113* 110  CO2 21* 18* 16* 20*  GLUCOSE 122* 148* 139* 130*  BUN 33* 28* 27* 22  CREATININE 1.97* 1.79* 1.77* 1.80*  CALCIUM 9.0 8.5* 8.6* 8.4*    GFR Estimated Creatinine Clearance: 34.3 mL/min (A) (by C-G formula based on SCr of 1.8 mg/dL (H)). Liver Function Tests: Recent Labs  Lab 10/30/21 1629  AST 33  ALT 23  ALKPHOS 53  BILITOT 1.4*  PROT 6.8  ALBUMIN 3.7    No results for input(s): LIPASE, AMYLASE in the last 168 hours. No results for input(s): AMMONIA in the last 168 hours. Coagulation profile Recent Labs  Lab 10/30/21 1701  INR 1.5*     CBC: Recent Labs  Lab 10/30/21 1629 11/01/21 0158 11/02/21 0337  WBC 6.6 9.0 7.5  NEUTROABS 4.1 6.8 4.9  HGB 11.7* 11.6* 10.4*  HCT 35.2* 35.8* 31.2*  MCV 97.0 97.8 94.3  PLT 147* 145* 133*    Cardiac Enzymes: No results for input(s): CKTOTAL, CKMB, CKMBINDEX, TROPONINI in the last 168 hours. BNP: Invalid input(s): POCBNP CBG: Recent Labs  Lab 11/01/21 1100 11/01/21 2213 11/02/21 0027 11/02/21 0923 11/02/21 1152  GLUCAP 193* 102* 111* 125* 234*    D-Dimer No results for input(s): DDIMER in the last 72 hours. Hgb A1c Recent Labs    10/31/21 0622  HGBA1C 7.4*    Lipid Profile No results for input(s): CHOL, HDL, LDLCALC, TRIG, CHOLHDL, LDLDIRECT in the last 72 hours. Thyroid function studies Recent Labs    10/30/21 1629  TSH 2.583    Anemia work up Recent Labs    10/31/21 0622  VITAMINB12 208  FOLATE 28.5  FERRITIN 115  TIBC 314  IRON 68  RETICCTPCT 1.5    Microbiology Recent Results (from the past 240 hour(s))  Resp Panel by  RT-PCR (Flu A&B, Covid) Nasopharyngeal Swab     Status: None   Collection Time: 10/30/21  5:01 PM   Specimen: Nasopharyngeal Swab; Nasopharyngeal(NP) swabs in vial transport medium  Result Value Ref Range Status   SARS Coronavirus 2 by RT PCR NEGATIVE NEGATIVE Final    Comment: (NOTE) SARS-CoV-2 target nucleic acids are NOT DETECTED.  The SARS-CoV-2 RNA is generally detectable in upper respiratory specimens during the acute phase of infection. The lowest concentration of SARS-CoV-2 viral copies this assay can detect is 138 copies/mL. A negative result does not preclude SARS-Cov-2 infection and should not be used as the sole basis for treatment or other patient management decisions. A negative result may occur with  improper specimen collection/handling, submission of specimen other than nasopharyngeal swab, presence of viral mutation(s) within the areas targeted by this assay, and  inadequate number of viral copies(<138 copies/mL). A negative result must be combined with clinical observations, patient history, and epidemiological information. The expected result is Negative.  Fact Sheet for Patients:  EntrepreneurPulse.com.au  Fact Sheet for Healthcare Providers:  IncredibleEmployment.be  This test is no t yet approved or cleared by the [place] FDA and  has been authorized for detection and/or diagnosis of SARS-CoV-2 by FDA under an Emergency Use Authorization (EUA). This EUA will remain  in effect (meaning this test can be used) for the duration of the COVID-19 declaration under Section 564(b)(1) of the Act, 21 U.S.C.section 360bbb-3(b)(1), unless the authorization is terminated  or revoked sooner.       Influenza A by PCR NEGATIVE NEGATIVE Final   Influenza B by PCR NEGATIVE NEGATIVE Final    Comment: (NOTE) The Xpert Xpress SARS-CoV-2/FLU/RSV plus assay is intended as an aid in the diagnosis of influenza from Nasopharyngeal swab  specimens and should not be used as a sole basis for treatment. Nasal washings and aspirates are unacceptable for Xpert Xpress SARS-CoV-2/FLU/RSV testing.  Fact Sheet for Patients: EntrepreneurPulse.com.au  Fact Sheet for Healthcare Providers: IncredibleEmployment.be  This test is not yet approved or cleared by the [place] FDA and has been authorized for detection and/or diagnosis of SARS-CoV-2 by FDA under an Emergency Use Authorization (EUA). This EUA will remain in effect (meaning this test can be used) for the duration of the COVID-19 declaration under Section 564(b)(1) of the Act, 21 U.S.C. section 360bbb-3(b)(1), unless the authorization is terminated or revoked.  Performed at [facility], [place] [address] [place], [place] [address]   Culture, blood (routine x 2)     Status: None (Preliminary result)   Collection Time: 10/31/21  5:52 AM   Specimen: BLOOD  Result Value Ref Range Status   Specimen Description BLOOD RIGHT ANTECUBITAL  Final   Special Requests   Final    BOTTLES DRAWN AEROBIC AND ANAEROBIC Blood Culture results may not be optimal due to an inadequate volume of blood received in culture bottles   Culture   Final    NO GROWTH 2 DAYS Performed at [facility], [place] [address], [place], [place] [address]    Report Status PENDING  Incomplete  Culture, blood (routine x 2)     Status: None (Preliminary result)   Collection Time: 10/31/21  6:20 AM   Specimen: BLOOD RIGHT ARM  Result Value Ref Range Status   Specimen Description BLOOD RIGHT ARM  Final   Special Requests   Final    BOTTLES DRAWN AEROBIC AND ANAEROBIC Blood Culture results may not be optimal due to an inadequate volume of blood received in culture bottles   Culture   Final    NO GROWTH 2 DAYS Performed at [facility], [place] [address], [place], [place] [address]    Report Status PENDING  Incomplete  Surgical PCR screen     Status: None    Collection Time: 10/31/21  2:00 PM   Specimen: Nasal Mucosa; Nasal Swab  Result Value Ref Range Status   MRSA, PCR NEGATIVE NEGATIVE Final   Staphylococcus aureus NEGATIVE NEGATIVE Final    Comment: (NOTE) The Xpert SA Assay (FDA approved for NASAL specimens in patients 27 years of age and older), is one component of a comprehensive surveillance program. It is not intended to diagnose infection nor to guide or monitor treatment. Performed at [facility], [place] [address], [place], [place] [address]  Signed: Annita Brod  [facility] 11/02/2021, 12:37 PM

## 2021-11-02 NOTE — Plan of Care (Signed)
°  Problem: Education: Goal: Knowledge of cardiac device and self-care will improve Outcome: Progressing Goal: Ability to safely manage health related needs after discharge will improve Outcome: Progressing

## 2021-11-02 NOTE — Progress Notes (Signed)
Physical Therapy Treatment Patient Details Name: Manuel Atkins MRN: [id number] DOB: 02-08-1933 Today's Date: 11/02/2021   History of Present Illness Pt is an 87 y.o. male sent to the ED from his pulmonologist office on 10/30/21 for evaluation of symptomatic bradycardia. S/p pacemaker insertion 12/22. PMH includes ILD, HTN, GERD, HLD, OSA on BiPAP, DM2, ulcerative enterocolitis, CMV colitis, BPH, CKD stage III.   PT Comments    Pt progressing with mobility. Pt and wife not improved mobility, pt requiring intermittent minA for mobility and ADL tasks this session with use of RW. Wife reports plan to d/c to son's home where increased family assist will be available. Educ re: activity recommendations, fall risk reduction, DME use. If to remain admitted, will continue to follow acutely.  Post-mobility BP 155/80 HR up to 103 SpO2 96% on RA    Recommendations for follow up therapy are one component of a multi-disciplinary discharge planning process, led by the attending physician.  Recommendations may be updated based on patient status, additional functional criteria and insurance authorization.  Follow Up Recommendations  Home health PT     Assistance Recommended at Discharge Frequent or constant Supervision/Assistance  Equipment Recommendations  None recommended by PT    Recommendations for Other Services       Precautions / Restrictions Precautions Precautions: Fall;ICD/Pacemaker Precaution Comments: pt with improving awareness of LUE precautions, although now requiring cues to actually use LUE during session     Mobility  Bed Mobility Overal bed mobility: Needs Assistance Bed Mobility: Supine to Sit     Supine to sit: Min assist          Transfers Overall transfer level: Needs assistance Equipment used: Rolling walker (2 wheels) Transfers: Sit to/from Stand Sit to Stand: Min guard;Min assist           General transfer comment: Initial stand from elevated EOB to RW,  cues for sequencing, min guard for balance; additional stand from low recliner with minA for trunk elevation; repeated cues for sequencing and hand placement    Ambulation/Gait Ambulation/Gait assistance: Min guard Gait Distance (Feet): 36 Feet Assistive device: Rolling walker (2 wheels) Gait Pattern/deviations: Step-through pattern;Decreased stride length;Wide base of support;Trunk flexed Gait velocity: Decreased     General Gait Details: Slow, mostly steady gait with RW and min guard, intermittent cues for navigating around obstacles and maintaining closer proximity to [employer] Mobility    Modified Rankin (Stroke Patients Only)       Balance Overall balance assessment: Needs assistance Sitting-balance support: Feet supported Sitting balance-Leahy Scale: Fair     Standing balance support: Bilateral upper extremity supported;Single extremity supported;No upper extremity supported;During functional activity Standing balance-Leahy Scale: Fair Standing balance comment: can static stand at sink for ADL tasks withotu UE support, preference for single to bilateral UE support with mobility                            Cognition Arousal/Alertness: Awake/alert Behavior During Therapy: WFL for tasks assessed/performed Overall Cognitive Status: Impaired/Different from baseline Area of Impairment: Attention;Memory;Following commands;Safety/judgement;Awareness;Problem solving                   Current Attention Level: Sustained;Selective Memory: Decreased recall of precautions Following Commands: Follows one step commands consistently Safety/Judgement: Decreased awareness of safety Awareness: Emergent Problem Solving: Requires verbal cues General Comments: Wife did not speak to  pt's baseline cognition. Suspect pt nearing baseline        Exercises      General Comments General comments (skin integrity, edema, etc.): wife Abe People)  present and supportive; reports plan to d/c to son's home with increased family assist available. encouraged use of gait belt to help pt stand and guard with ambulation/ADLs (gait belt already issued)      Pertinent Vitals/Pain Pain Assessment: No/denies pain Pain Intervention(s): Monitored during session    Home Living                          Prior Function            PT Goals (current goals can now be found in the care plan section) Acute Rehab PT Goals Patient Stated Goal: discharge to son's home for family assist Progress towards PT goals: Progressing toward goals    Frequency    Min 3X/week      PT Plan Current plan remains appropriate    Co-evaluation              AM-PAC PT "6 Clicks" Mobility   Outcome Measure  Help needed turning from your back to your side while in a flat bed without using bedrails?: A Little Help needed moving from lying on your back to sitting on the side of a flat bed without using bedrails?: A Little Help needed moving to and from a bed to a chair (including a wheelchair)?: A Little Help needed standing up from a chair using your arms (e.g., wheelchair or bedside chair)?: A Little Help needed to walk in hospital room?: A Little Help needed climbing 3-5 steps with a railing? : A Lot 6 Click Score: 17    End of Session Equipment Utilized During Treatment: Gait belt Activity Tolerance: Patient tolerated treatment well Patient left: in chair;with call bell/phone within reach;with family/visitor present Nurse Communication: Mobility status PT Visit Diagnosis: Other abnormalities of gait and mobility (R26.89);History of falling (Z91.81);Muscle weakness (generalized) (M62.81)     Time: 1021-1173 PT Time Calculation (min) (ACUTE ONLY): 28 min  Charges:  $Therapeutic Activity: 23-37 mins                     Mabeline Caras, PT, DPT Acute Rehabilitation Services  Pager [phone] Office [phone]  Derry Lory 11/02/2021, 9:06 AM

## 2021-11-02 NOTE — Progress Notes (Signed)
Received call from [place] with [facility]. She checked with the office in [place] to see if they can accept the referral, but they declined.

## 2021-11-02 NOTE — Progress Notes (Addendum)
Contacted [facility] to f/u with [facility] referral. Spoke with Katrina, she reports that she received an email from [place] and she is waiting for the [facility] branch to respond. Informed Katrina that I need to know if they can't accept the referral, so I can contact another agency. Will continue to f/u to assist with the D/C plan.  11:40 - Contacted Katrina, she reports that she is hasn't been able to contact someone in [place] and she is working on it. Informed Katrina that pt has been D/C. She reports that she will f/u with [facility].   11:50 - Received message from [facility], they declined the [facility] referral. [employer] with [facility], he reports that they don't service [place], [place]. Contacted Angie with [facility] and left her a VM regarding [place] referral.  12:05 - Unable to find a [place] agency to provide [facility] services. Notified Dr. Maryland Pink and pt's son Jeneen Rinks). Jeneen Rinks inquired [facility] services when his father returns home. Encouraged Jeneen Rinks to ask pt's PCP to order [facility] when he returns home.  13:27 - Angie with [facility] did not return my call.

## 2021-11-02 NOTE — Progress Notes (Signed)
Occupational Therapy Treatment Patient Details Name: Manuel Atkins MRN: [id number] DOB: [birth year] Today's Date: 11/02/2021   History of present illness Pt is an 87 y.o. male sent to the ED from his pulmonologist office on 10/30/21 for evaluation of symptomatic bradycardia. S/p pacemaker insertion 12/22. PMH includes ILD, HTN, GERD, HLD, OSA on BiPAP, DM2, ulcerative enterocolitis, CMV colitis, BPH, CKD stage III.   OT comments  Pt seen today to progress functional mobility and activity tolerance for safe ADL performance once discharged home. This session, pt presents with increased mobility and activity tolerance, as well as balance. Overall requiring min guard to min A throughout session. Additionally pt tolerated back to back PT and OT sessions, requiring him to ambulate further and participate in tasks longer. OT will continue to follow acutely.    Recommendations for follow up therapy are one component of a multi-disciplinary discharge planning process, led by the attending physician.  Recommendations may be updated based on patient status, additional functional criteria and insurance authorization.    Follow Up Recommendations  Home health OT    Assistance Recommended at Discharge Frequent or constant Supervision/Assistance  Equipment Recommendations  None recommended by OT    Recommendations for Other Services      Precautions / Restrictions Precautions Precautions: Fall;ICD/Pacemaker Precaution Comments: pt with improving awareness of LUE precautions, although now requiring cues to actually use LUE during session Restrictions Weight Bearing Restrictions: Yes LUE Weight Bearing: Non weight bearing       Mobility Bed Mobility Overal bed mobility: Needs Assistance Bed Mobility: Supine to Sit     Supine to sit: Min assist     General bed mobility comments: In recliner upon arrival    Transfers Overall transfer level: Needs assistance Equipment used: Rolling walker (2  wheels) Transfers: Sit to/from Stand Sit to Stand: Min guard;Min assist           General transfer comment: Initial stand from recliner was min guard, 2nd stand after mobility requiring min A to power up.     Balance Overall balance assessment: Needs assistance Sitting-balance support: Feet supported Sitting balance-Leahy Scale: Fair     Standing balance support: Reliant on assistive device for balance Standing balance-Leahy Scale: Fair Standing balance comment: can static stand at sink for ADL tasks withotu UE support, preference for single to bilateral UE support with mobility                           ADL either performed or assessed with clinical judgement   ADL Overall ADL's : Needs assistance/impaired     Grooming: Min guard;Standing Grooming Details (indicate cue type and reason): Can complete standing at sink                 Toilet Transfer: Minimal assistance;Ambulation Toilet Transfer Details (indicate cue type and reason): Min A for power up and cues Toileting- Clothing Manipulation and Hygiene: Minimal assistance;Cueing for safety;Sit to/from stand;Sitting/lateral lean Toileting - Clothing Manipulation Details (indicate cue type and reason): cuing for saftey            Extremity/Trunk Assessment              Vision       Perception     Praxis      Cognition Arousal/Alertness: Awake/alert Behavior During Therapy: WFL for tasks assessed/performed Overall Cognitive Status: Impaired/Different from baseline Area of Impairment: Attention;Memory;Following commands;Safety/judgement;Awareness;Problem solving  Current Attention Level: Sustained;Selective Memory: Decreased recall of precautions Following Commands: Follows one step commands consistently Safety/Judgement: Decreased awareness of safety Awareness: Emergent Problem Solving: Requires verbal cues General Comments: Wife did not speak to pt's baseline  cognition. Suspect pt nearing baseline          Exercises     Shoulder Instructions       General Comments Wife present throughout session.    Pertinent Vitals/ Pain       Pain Assessment: Faces Faces Pain Scale: Hurts a little bit Pain Location: LUE with putting pressure through elbow to stand. Pain Descriptors / Indicators: Sore Pain Intervention(s): Monitored during session;Repositioned  Home Living                                          Prior Functioning/Environment              Frequency  Min 3X/week        Progress Toward Goals  OT Goals(current goals can now be found in the care plan section)  Progress towards OT goals: Progressing toward goals  Acute Rehab OT Goals Patient Stated Goal: To go home OT Goal Formulation: With patient/family Time For Goal Achievement: 11/15/21 Potential to Achieve Goals: Good ADL Goals Pt Will Perform Grooming: with modified independence;standing Pt Will Perform Lower Body Dressing: with modified independence;sit to/from stand Pt Will Transfer to Toilet: with modified independence;ambulating;bedside commode Pt Will Perform Toileting - Clothing Manipulation and hygiene: with modified independence;sitting/lateral leans;sit to/from stand  Plan Discharge plan remains appropriate;Frequency remains appropriate    Co-evaluation                 AM-PAC OT "6 Clicks" Daily Activity     Outcome Measure   Help from another person eating meals?: None Help from another person taking care of personal grooming?: A Little Help from another person toileting, which includes using toliet, bedpan, or urinal?: A Little Help from another person bathing (including washing, rinsing, drying)?: A Lot Help from another person to put on and taking off regular upper body clothing?: A Little Help from another person to put on and taking off regular lower body clothing?: A Lot 6 Click Score: 17    End of Session  Equipment Utilized During Treatment: Gait belt;Rolling walker (2 wheels)  OT Visit Diagnosis: Unsteadiness on feet (R26.81);Other abnormalities of gait and mobility (R26.89);Muscle weakness (generalized) (M62.81)   Activity Tolerance Patient tolerated treatment well   Patient Left in chair;with call bell/phone within reach;with family/visitor present   Nurse Communication Mobility status        Time: 8341-9622 OT Time Calculation (min): 26 min  Charges: OT General Charges $OT Visit: 1 Visit OT Treatments $Therapeutic Activity: 23-37 mins  Sabrinna Yearwood H., OTR/L Acute Rehabilitation  Kenta Laster Elane Lindsi Bayliss 11/02/2021, 9:15 AM

## 2021-11-02 NOTE — Progress Notes (Signed)
Discharge instructions, RX's and follow up appts explained and provided to patient and son verbalized understanding. Patient left floor via wheelchair accompanied by staff no c/o pain or shortness of breath at d/c.  Adora Yeh, Tivis Ringer, RN

## 2021-11-04 LAB — GLUCOSE, CAPILLARY: Glucose-Capillary: 176 mg/dL — ABNORMAL HIGH (ref 70–99)

## 2021-11-05 LAB — CULTURE, BLOOD (ROUTINE X 2)
Culture: NO GROWTH
Culture: NO GROWTH

## 2021-11-06 DIAGNOSIS — N183 Chronic kidney disease, stage 3 unspecified: Secondary | ICD-10-CM | POA: Diagnosis not present

## 2021-11-06 DIAGNOSIS — J690 Pneumonitis due to inhalation of food and vomit: Secondary | ICD-10-CM | POA: Diagnosis not present

## 2021-11-06 DIAGNOSIS — D529 Folate deficiency anemia, unspecified: Secondary | ICD-10-CM | POA: Diagnosis not present

## 2021-11-06 DIAGNOSIS — D519 Vitamin B12 deficiency anemia, unspecified: Secondary | ICD-10-CM | POA: Diagnosis not present

## 2021-11-06 DIAGNOSIS — I442 Atrioventricular block, complete: Secondary | ICD-10-CM | POA: Diagnosis not present

## 2021-11-06 DIAGNOSIS — D509 Iron deficiency anemia, unspecified: Secondary | ICD-10-CM | POA: Diagnosis not present

## 2021-11-06 DIAGNOSIS — E1165 Type 2 diabetes mellitus with hyperglycemia: Secondary | ICD-10-CM | POA: Diagnosis not present

## 2021-11-12 DIAGNOSIS — D631 Anemia in chronic kidney disease: Secondary | ICD-10-CM | POA: Diagnosis not present

## 2021-11-12 DIAGNOSIS — K219 Gastro-esophageal reflux disease without esophagitis: Secondary | ICD-10-CM | POA: Diagnosis not present

## 2021-11-12 DIAGNOSIS — J849 Interstitial pulmonary disease, unspecified: Secondary | ICD-10-CM | POA: Diagnosis not present

## 2021-11-12 DIAGNOSIS — G4733 Obstructive sleep apnea (adult) (pediatric): Secondary | ICD-10-CM | POA: Diagnosis not present

## 2021-11-12 DIAGNOSIS — C4362 Malignant melanoma of left upper limb, including shoulder: Secondary | ICD-10-CM | POA: Diagnosis not present

## 2021-11-12 DIAGNOSIS — F32A Depression, unspecified: Secondary | ICD-10-CM | POA: Diagnosis not present

## 2021-11-12 DIAGNOSIS — E785 Hyperlipidemia, unspecified: Secondary | ICD-10-CM | POA: Diagnosis not present

## 2021-11-12 DIAGNOSIS — E1122 Type 2 diabetes mellitus with diabetic chronic kidney disease: Secondary | ICD-10-CM | POA: Diagnosis not present

## 2021-11-12 DIAGNOSIS — I442 Atrioventricular block, complete: Secondary | ICD-10-CM | POA: Diagnosis not present

## 2021-11-12 DIAGNOSIS — Z48812 Encounter for surgical aftercare following surgery on the circulatory system: Secondary | ICD-10-CM | POA: Diagnosis not present

## 2021-11-12 DIAGNOSIS — M1990 Unspecified osteoarthritis, unspecified site: Secondary | ICD-10-CM | POA: Diagnosis not present

## 2021-11-12 DIAGNOSIS — I10 Essential (primary) hypertension: Secondary | ICD-10-CM | POA: Diagnosis not present

## 2021-11-12 DIAGNOSIS — N1832 Chronic kidney disease, stage 3b: Secondary | ICD-10-CM | POA: Diagnosis not present

## 2021-11-12 DIAGNOSIS — Z7984 Long term (current) use of oral hypoglycemic drugs: Secondary | ICD-10-CM | POA: Diagnosis not present

## 2021-11-12 DIAGNOSIS — Z7901 Long term (current) use of anticoagulants: Secondary | ICD-10-CM | POA: Diagnosis not present

## 2021-11-12 DIAGNOSIS — I129 Hypertensive chronic kidney disease with stage 1 through stage 4 chronic kidney disease, or unspecified chronic kidney disease: Secondary | ICD-10-CM | POA: Diagnosis not present

## 2021-11-12 DIAGNOSIS — I160 Hypertensive urgency: Secondary | ICD-10-CM | POA: Diagnosis not present

## 2021-11-12 DIAGNOSIS — K5100 Ulcerative (chronic) pancolitis without complications: Secondary | ICD-10-CM | POA: Diagnosis not present

## 2021-11-12 DIAGNOSIS — Z950 Presence of cardiac pacemaker: Secondary | ICD-10-CM | POA: Diagnosis not present

## 2021-11-12 DIAGNOSIS — R001 Bradycardia, unspecified: Secondary | ICD-10-CM | POA: Diagnosis not present

## 2021-11-12 DIAGNOSIS — Z9049 Acquired absence of other specified parts of digestive tract: Secondary | ICD-10-CM | POA: Diagnosis not present

## 2021-11-12 DIAGNOSIS — Z86711 Personal history of pulmonary embolism: Secondary | ICD-10-CM | POA: Diagnosis not present

## 2021-11-12 DIAGNOSIS — Z87891 Personal history of nicotine dependence: Secondary | ICD-10-CM | POA: Diagnosis not present

## 2021-11-13 ENCOUNTER — Ambulatory Visit (INDEPENDENT_AMBULATORY_CARE_PROVIDER_SITE_OTHER): Payer: Medicare Other

## 2021-11-13 ENCOUNTER — Other Ambulatory Visit: Payer: Self-pay

## 2021-11-13 DIAGNOSIS — I4430 Unspecified atrioventricular block: Secondary | ICD-10-CM | POA: Diagnosis not present

## 2021-11-13 LAB — CUP PACEART INCLINIC DEVICE CHECK
Brady Statistic RA Percent Paced: 2 %
Brady Statistic RV Percent Paced: 98 %
Date Time Interrogation Session: 20230104171319
Implantable Lead Implant Date: 20221222
Implantable Lead Implant Date: 20221222
Implantable Lead Location: 753858
Implantable Lead Location: 753859
Implantable Lead Model: 377171
Implantable Lead Model: 377171
Implantable Lead Serial Number: 8000640340
Implantable Lead Serial Number: 8000648576
Implantable Pulse Generator Implant Date: 20221222
Lead Channel Impedance Value: 526 Ohm
Lead Channel Impedance Value: 526 Ohm
Lead Channel Pacing Threshold Amplitude: 0.7 V
Lead Channel Pacing Threshold Amplitude: 1 V
Lead Channel Pacing Threshold Pulse Width: 0.4 ms
Lead Channel Pacing Threshold Pulse Width: 0.4 ms
Lead Channel Sensing Intrinsic Amplitude: 2.2 mV
Lead Channel Sensing Intrinsic Amplitude: 21.9 mV
Lead Channel Setting Pacing Amplitude: 3 V
Lead Channel Setting Pacing Amplitude: 3 V
Lead Channel Setting Pacing Pulse Width: 0.4 ms
Pulse Gen Model: 407145
Pulse Gen Serial Number: 70300615

## 2021-11-13 NOTE — Progress Notes (Signed)
Wound check appointment. Steri-strips removed. Wound without redness. Small amount of swelling noted, firm on palpitation. Patient reports decrease in swelling. Advised to call device clinic if swelling increases. Incision edges approximated, wound well healed. Normal device function. Thresholds, sensing, and impedances consistent with implant measurements. Device programmed at 3.5V/auto capture programmed on for extra safety margin until 3 month visit. Histogram distribution appropriate for patient and level of activity. No mode switches or high ventricular rates noted. Patient educated about wound care, arm mobility, lifting restrictions. ROV in 3 months with implanting physician.

## 2021-11-13 NOTE — Patient Instructions (Signed)

## 2021-11-19 DIAGNOSIS — Z87891 Personal history of nicotine dependence: Secondary | ICD-10-CM | POA: Diagnosis not present

## 2021-11-19 DIAGNOSIS — N1832 Chronic kidney disease, stage 3b: Secondary | ICD-10-CM | POA: Diagnosis not present

## 2021-11-19 DIAGNOSIS — Z7984 Long term (current) use of oral hypoglycemic drugs: Secondary | ICD-10-CM | POA: Diagnosis not present

## 2021-11-19 DIAGNOSIS — E1122 Type 2 diabetes mellitus with diabetic chronic kidney disease: Secondary | ICD-10-CM | POA: Diagnosis not present

## 2021-11-19 DIAGNOSIS — Z48812 Encounter for surgical aftercare following surgery on the circulatory system: Secondary | ICD-10-CM | POA: Diagnosis not present

## 2021-11-19 DIAGNOSIS — K219 Gastro-esophageal reflux disease without esophagitis: Secondary | ICD-10-CM | POA: Diagnosis not present

## 2021-11-19 DIAGNOSIS — K5100 Ulcerative (chronic) pancolitis without complications: Secondary | ICD-10-CM | POA: Diagnosis not present

## 2021-11-19 DIAGNOSIS — Z950 Presence of cardiac pacemaker: Secondary | ICD-10-CM | POA: Diagnosis not present

## 2021-11-19 DIAGNOSIS — I160 Hypertensive urgency: Secondary | ICD-10-CM | POA: Diagnosis not present

## 2021-11-19 DIAGNOSIS — I129 Hypertensive chronic kidney disease with stage 1 through stage 4 chronic kidney disease, or unspecified chronic kidney disease: Secondary | ICD-10-CM | POA: Diagnosis not present

## 2021-11-19 DIAGNOSIS — Z86711 Personal history of pulmonary embolism: Secondary | ICD-10-CM | POA: Diagnosis not present

## 2021-11-19 DIAGNOSIS — I442 Atrioventricular block, complete: Secondary | ICD-10-CM | POA: Diagnosis not present

## 2021-11-19 DIAGNOSIS — F32A Depression, unspecified: Secondary | ICD-10-CM | POA: Diagnosis not present

## 2021-11-19 DIAGNOSIS — R001 Bradycardia, unspecified: Secondary | ICD-10-CM | POA: Diagnosis not present

## 2021-11-19 DIAGNOSIS — J849 Interstitial pulmonary disease, unspecified: Secondary | ICD-10-CM | POA: Diagnosis not present

## 2021-11-19 DIAGNOSIS — Z7901 Long term (current) use of anticoagulants: Secondary | ICD-10-CM | POA: Diagnosis not present

## 2021-11-19 DIAGNOSIS — G4733 Obstructive sleep apnea (adult) (pediatric): Secondary | ICD-10-CM | POA: Diagnosis not present

## 2021-11-19 DIAGNOSIS — M1990 Unspecified osteoarthritis, unspecified site: Secondary | ICD-10-CM | POA: Diagnosis not present

## 2021-11-19 DIAGNOSIS — C4362 Malignant melanoma of left upper limb, including shoulder: Secondary | ICD-10-CM | POA: Diagnosis not present

## 2021-11-19 DIAGNOSIS — E785 Hyperlipidemia, unspecified: Secondary | ICD-10-CM | POA: Diagnosis not present

## 2021-11-19 DIAGNOSIS — Z9049 Acquired absence of other specified parts of digestive tract: Secondary | ICD-10-CM | POA: Diagnosis not present

## 2021-11-19 DIAGNOSIS — D631 Anemia in chronic kidney disease: Secondary | ICD-10-CM | POA: Diagnosis not present

## 2021-11-26 DIAGNOSIS — Z7984 Long term (current) use of oral hypoglycemic drugs: Secondary | ICD-10-CM | POA: Diagnosis not present

## 2021-11-26 DIAGNOSIS — E1122 Type 2 diabetes mellitus with diabetic chronic kidney disease: Secondary | ICD-10-CM | POA: Diagnosis not present

## 2021-11-26 DIAGNOSIS — N1832 Chronic kidney disease, stage 3b: Secondary | ICD-10-CM | POA: Diagnosis not present

## 2021-11-26 DIAGNOSIS — D631 Anemia in chronic kidney disease: Secondary | ICD-10-CM | POA: Diagnosis not present

## 2021-11-26 DIAGNOSIS — I129 Hypertensive chronic kidney disease with stage 1 through stage 4 chronic kidney disease, or unspecified chronic kidney disease: Secondary | ICD-10-CM | POA: Diagnosis not present

## 2021-11-26 DIAGNOSIS — R001 Bradycardia, unspecified: Secondary | ICD-10-CM | POA: Diagnosis not present

## 2021-11-26 DIAGNOSIS — E785 Hyperlipidemia, unspecified: Secondary | ICD-10-CM | POA: Diagnosis not present

## 2021-11-26 DIAGNOSIS — I442 Atrioventricular block, complete: Secondary | ICD-10-CM | POA: Diagnosis not present

## 2021-11-26 DIAGNOSIS — Z9049 Acquired absence of other specified parts of digestive tract: Secondary | ICD-10-CM | POA: Diagnosis not present

## 2021-11-26 DIAGNOSIS — Z48812 Encounter for surgical aftercare following surgery on the circulatory system: Secondary | ICD-10-CM | POA: Diagnosis not present

## 2021-11-26 DIAGNOSIS — M1990 Unspecified osteoarthritis, unspecified site: Secondary | ICD-10-CM | POA: Diagnosis not present

## 2021-11-26 DIAGNOSIS — Z86711 Personal history of pulmonary embolism: Secondary | ICD-10-CM | POA: Diagnosis not present

## 2021-11-26 DIAGNOSIS — I160 Hypertensive urgency: Secondary | ICD-10-CM | POA: Diagnosis not present

## 2021-11-26 DIAGNOSIS — Z87891 Personal history of nicotine dependence: Secondary | ICD-10-CM | POA: Diagnosis not present

## 2021-11-26 DIAGNOSIS — J849 Interstitial pulmonary disease, unspecified: Secondary | ICD-10-CM | POA: Diagnosis not present

## 2021-11-26 DIAGNOSIS — C4362 Malignant melanoma of left upper limb, including shoulder: Secondary | ICD-10-CM | POA: Diagnosis not present

## 2021-11-26 DIAGNOSIS — F32A Depression, unspecified: Secondary | ICD-10-CM | POA: Diagnosis not present

## 2021-11-26 DIAGNOSIS — Z7901 Long term (current) use of anticoagulants: Secondary | ICD-10-CM | POA: Diagnosis not present

## 2021-11-26 DIAGNOSIS — Z950 Presence of cardiac pacemaker: Secondary | ICD-10-CM | POA: Diagnosis not present

## 2021-11-26 DIAGNOSIS — G4733 Obstructive sleep apnea (adult) (pediatric): Secondary | ICD-10-CM | POA: Diagnosis not present

## 2021-11-26 DIAGNOSIS — K219 Gastro-esophageal reflux disease without esophagitis: Secondary | ICD-10-CM | POA: Diagnosis not present

## 2021-11-26 DIAGNOSIS — K5100 Ulcerative (chronic) pancolitis without complications: Secondary | ICD-10-CM | POA: Diagnosis not present

## 2021-11-28 DIAGNOSIS — J849 Interstitial pulmonary disease, unspecified: Secondary | ICD-10-CM | POA: Diagnosis not present

## 2021-11-28 DIAGNOSIS — G4733 Obstructive sleep apnea (adult) (pediatric): Secondary | ICD-10-CM | POA: Diagnosis not present

## 2021-11-28 DIAGNOSIS — Z7984 Long term (current) use of oral hypoglycemic drugs: Secondary | ICD-10-CM | POA: Diagnosis not present

## 2021-11-28 DIAGNOSIS — Z87891 Personal history of nicotine dependence: Secondary | ICD-10-CM | POA: Diagnosis not present

## 2021-11-28 DIAGNOSIS — E785 Hyperlipidemia, unspecified: Secondary | ICD-10-CM | POA: Diagnosis not present

## 2021-11-28 DIAGNOSIS — Z9049 Acquired absence of other specified parts of digestive tract: Secondary | ICD-10-CM | POA: Diagnosis not present

## 2021-11-28 DIAGNOSIS — I129 Hypertensive chronic kidney disease with stage 1 through stage 4 chronic kidney disease, or unspecified chronic kidney disease: Secondary | ICD-10-CM | POA: Diagnosis not present

## 2021-11-28 DIAGNOSIS — Z48812 Encounter for surgical aftercare following surgery on the circulatory system: Secondary | ICD-10-CM | POA: Diagnosis not present

## 2021-11-28 DIAGNOSIS — E1122 Type 2 diabetes mellitus with diabetic chronic kidney disease: Secondary | ICD-10-CM | POA: Diagnosis not present

## 2021-11-28 DIAGNOSIS — C4362 Malignant melanoma of left upper limb, including shoulder: Secondary | ICD-10-CM | POA: Diagnosis not present

## 2021-11-28 DIAGNOSIS — F32A Depression, unspecified: Secondary | ICD-10-CM | POA: Diagnosis not present

## 2021-11-28 DIAGNOSIS — D631 Anemia in chronic kidney disease: Secondary | ICD-10-CM | POA: Diagnosis not present

## 2021-11-28 DIAGNOSIS — R001 Bradycardia, unspecified: Secondary | ICD-10-CM | POA: Diagnosis not present

## 2021-11-28 DIAGNOSIS — K5100 Ulcerative (chronic) pancolitis without complications: Secondary | ICD-10-CM | POA: Diagnosis not present

## 2021-11-28 DIAGNOSIS — Z950 Presence of cardiac pacemaker: Secondary | ICD-10-CM | POA: Diagnosis not present

## 2021-11-28 DIAGNOSIS — Z86711 Personal history of pulmonary embolism: Secondary | ICD-10-CM | POA: Diagnosis not present

## 2021-11-28 DIAGNOSIS — Z7901 Long term (current) use of anticoagulants: Secondary | ICD-10-CM | POA: Diagnosis not present

## 2021-11-28 DIAGNOSIS — I160 Hypertensive urgency: Secondary | ICD-10-CM | POA: Diagnosis not present

## 2021-11-28 DIAGNOSIS — K219 Gastro-esophageal reflux disease without esophagitis: Secondary | ICD-10-CM | POA: Diagnosis not present

## 2021-11-28 DIAGNOSIS — N1832 Chronic kidney disease, stage 3b: Secondary | ICD-10-CM | POA: Diagnosis not present

## 2021-11-28 DIAGNOSIS — I442 Atrioventricular block, complete: Secondary | ICD-10-CM | POA: Diagnosis not present

## 2021-11-28 DIAGNOSIS — M1990 Unspecified osteoarthritis, unspecified site: Secondary | ICD-10-CM | POA: Diagnosis not present

## 2021-12-01 DIAGNOSIS — N183 Chronic kidney disease, stage 3 unspecified: Secondary | ICD-10-CM | POA: Diagnosis not present

## 2021-12-01 DIAGNOSIS — I442 Atrioventricular block, complete: Secondary | ICD-10-CM | POA: Diagnosis not present

## 2021-12-01 DIAGNOSIS — J690 Pneumonitis due to inhalation of food and vomit: Secondary | ICD-10-CM | POA: Diagnosis not present

## 2021-12-01 DIAGNOSIS — E1165 Type 2 diabetes mellitus with hyperglycemia: Secondary | ICD-10-CM | POA: Diagnosis not present

## 2021-12-03 DIAGNOSIS — Z7901 Long term (current) use of anticoagulants: Secondary | ICD-10-CM | POA: Diagnosis not present

## 2021-12-03 DIAGNOSIS — D631 Anemia in chronic kidney disease: Secondary | ICD-10-CM | POA: Diagnosis not present

## 2021-12-03 DIAGNOSIS — C4362 Malignant melanoma of left upper limb, including shoulder: Secondary | ICD-10-CM | POA: Diagnosis not present

## 2021-12-03 DIAGNOSIS — K219 Gastro-esophageal reflux disease without esophagitis: Secondary | ICD-10-CM | POA: Diagnosis not present

## 2021-12-03 DIAGNOSIS — E1122 Type 2 diabetes mellitus with diabetic chronic kidney disease: Secondary | ICD-10-CM | POA: Diagnosis not present

## 2021-12-03 DIAGNOSIS — N1832 Chronic kidney disease, stage 3b: Secondary | ICD-10-CM | POA: Diagnosis not present

## 2021-12-03 DIAGNOSIS — F32A Depression, unspecified: Secondary | ICD-10-CM | POA: Diagnosis not present

## 2021-12-03 DIAGNOSIS — I442 Atrioventricular block, complete: Secondary | ICD-10-CM | POA: Diagnosis not present

## 2021-12-03 DIAGNOSIS — Z87891 Personal history of nicotine dependence: Secondary | ICD-10-CM | POA: Diagnosis not present

## 2021-12-03 DIAGNOSIS — M1990 Unspecified osteoarthritis, unspecified site: Secondary | ICD-10-CM | POA: Diagnosis not present

## 2021-12-03 DIAGNOSIS — Z48812 Encounter for surgical aftercare following surgery on the circulatory system: Secondary | ICD-10-CM | POA: Diagnosis not present

## 2021-12-03 DIAGNOSIS — Z86711 Personal history of pulmonary embolism: Secondary | ICD-10-CM | POA: Diagnosis not present

## 2021-12-03 DIAGNOSIS — G4733 Obstructive sleep apnea (adult) (pediatric): Secondary | ICD-10-CM | POA: Diagnosis not present

## 2021-12-03 DIAGNOSIS — Z9049 Acquired absence of other specified parts of digestive tract: Secondary | ICD-10-CM | POA: Diagnosis not present

## 2021-12-03 DIAGNOSIS — I129 Hypertensive chronic kidney disease with stage 1 through stage 4 chronic kidney disease, or unspecified chronic kidney disease: Secondary | ICD-10-CM | POA: Diagnosis not present

## 2021-12-03 DIAGNOSIS — R001 Bradycardia, unspecified: Secondary | ICD-10-CM | POA: Diagnosis not present

## 2021-12-03 DIAGNOSIS — K5100 Ulcerative (chronic) pancolitis without complications: Secondary | ICD-10-CM | POA: Diagnosis not present

## 2021-12-03 DIAGNOSIS — I160 Hypertensive urgency: Secondary | ICD-10-CM | POA: Diagnosis not present

## 2021-12-03 DIAGNOSIS — Z7984 Long term (current) use of oral hypoglycemic drugs: Secondary | ICD-10-CM | POA: Diagnosis not present

## 2021-12-03 DIAGNOSIS — Z950 Presence of cardiac pacemaker: Secondary | ICD-10-CM | POA: Diagnosis not present

## 2021-12-03 DIAGNOSIS — E785 Hyperlipidemia, unspecified: Secondary | ICD-10-CM | POA: Diagnosis not present

## 2021-12-03 DIAGNOSIS — J849 Interstitial pulmonary disease, unspecified: Secondary | ICD-10-CM | POA: Diagnosis not present

## 2021-12-05 DIAGNOSIS — Z48812 Encounter for surgical aftercare following surgery on the circulatory system: Secondary | ICD-10-CM | POA: Diagnosis not present

## 2021-12-05 DIAGNOSIS — Z950 Presence of cardiac pacemaker: Secondary | ICD-10-CM | POA: Diagnosis not present

## 2021-12-05 DIAGNOSIS — Z86711 Personal history of pulmonary embolism: Secondary | ICD-10-CM | POA: Diagnosis not present

## 2021-12-05 DIAGNOSIS — K5100 Ulcerative (chronic) pancolitis without complications: Secondary | ICD-10-CM | POA: Diagnosis not present

## 2021-12-05 DIAGNOSIS — F32A Depression, unspecified: Secondary | ICD-10-CM | POA: Diagnosis not present

## 2021-12-05 DIAGNOSIS — Z87891 Personal history of nicotine dependence: Secondary | ICD-10-CM | POA: Diagnosis not present

## 2021-12-05 DIAGNOSIS — I160 Hypertensive urgency: Secondary | ICD-10-CM | POA: Diagnosis not present

## 2021-12-05 DIAGNOSIS — G4733 Obstructive sleep apnea (adult) (pediatric): Secondary | ICD-10-CM | POA: Diagnosis not present

## 2021-12-05 DIAGNOSIS — K219 Gastro-esophageal reflux disease without esophagitis: Secondary | ICD-10-CM | POA: Diagnosis not present

## 2021-12-05 DIAGNOSIS — N1832 Chronic kidney disease, stage 3b: Secondary | ICD-10-CM | POA: Diagnosis not present

## 2021-12-05 DIAGNOSIS — Z7901 Long term (current) use of anticoagulants: Secondary | ICD-10-CM | POA: Diagnosis not present

## 2021-12-05 DIAGNOSIS — E1122 Type 2 diabetes mellitus with diabetic chronic kidney disease: Secondary | ICD-10-CM | POA: Diagnosis not present

## 2021-12-05 DIAGNOSIS — E785 Hyperlipidemia, unspecified: Secondary | ICD-10-CM | POA: Diagnosis not present

## 2021-12-05 DIAGNOSIS — Z7984 Long term (current) use of oral hypoglycemic drugs: Secondary | ICD-10-CM | POA: Diagnosis not present

## 2021-12-05 DIAGNOSIS — I442 Atrioventricular block, complete: Secondary | ICD-10-CM | POA: Diagnosis not present

## 2021-12-05 DIAGNOSIS — Z9049 Acquired absence of other specified parts of digestive tract: Secondary | ICD-10-CM | POA: Diagnosis not present

## 2021-12-05 DIAGNOSIS — M1990 Unspecified osteoarthritis, unspecified site: Secondary | ICD-10-CM | POA: Diagnosis not present

## 2021-12-05 DIAGNOSIS — J849 Interstitial pulmonary disease, unspecified: Secondary | ICD-10-CM | POA: Diagnosis not present

## 2021-12-05 DIAGNOSIS — C4362 Malignant melanoma of left upper limb, including shoulder: Secondary | ICD-10-CM | POA: Diagnosis not present

## 2021-12-05 DIAGNOSIS — D631 Anemia in chronic kidney disease: Secondary | ICD-10-CM | POA: Diagnosis not present

## 2021-12-05 DIAGNOSIS — I129 Hypertensive chronic kidney disease with stage 1 through stage 4 chronic kidney disease, or unspecified chronic kidney disease: Secondary | ICD-10-CM | POA: Diagnosis not present

## 2021-12-05 DIAGNOSIS — R001 Bradycardia, unspecified: Secondary | ICD-10-CM | POA: Diagnosis not present

## 2021-12-09 DIAGNOSIS — I2699 Other pulmonary embolism without acute cor pulmonale: Secondary | ICD-10-CM | POA: Diagnosis not present

## 2021-12-09 DIAGNOSIS — K5100 Ulcerative (chronic) pancolitis without complications: Secondary | ICD-10-CM | POA: Diagnosis not present

## 2021-12-09 DIAGNOSIS — I10 Essential (primary) hypertension: Secondary | ICD-10-CM | POA: Diagnosis not present

## 2021-12-09 DIAGNOSIS — Z7189 Other specified counseling: Secondary | ICD-10-CM | POA: Diagnosis not present

## 2021-12-09 DIAGNOSIS — E1122 Type 2 diabetes mellitus with diabetic chronic kidney disease: Secondary | ICD-10-CM | POA: Diagnosis not present

## 2021-12-09 DIAGNOSIS — R4582 Worries: Secondary | ICD-10-CM | POA: Diagnosis not present

## 2021-12-09 DIAGNOSIS — G43909 Migraine, unspecified, not intractable, without status migrainosus: Secondary | ICD-10-CM | POA: Diagnosis not present

## 2021-12-09 DIAGNOSIS — D692 Other nonthrombocytopenic purpura: Secondary | ICD-10-CM | POA: Diagnosis not present

## 2021-12-10 DIAGNOSIS — Z950 Presence of cardiac pacemaker: Secondary | ICD-10-CM | POA: Diagnosis not present

## 2021-12-10 DIAGNOSIS — I129 Hypertensive chronic kidney disease with stage 1 through stage 4 chronic kidney disease, or unspecified chronic kidney disease: Secondary | ICD-10-CM | POA: Diagnosis not present

## 2021-12-10 DIAGNOSIS — M1990 Unspecified osteoarthritis, unspecified site: Secondary | ICD-10-CM | POA: Diagnosis not present

## 2021-12-10 DIAGNOSIS — Z86711 Personal history of pulmonary embolism: Secondary | ICD-10-CM | POA: Diagnosis not present

## 2021-12-10 DIAGNOSIS — K5100 Ulcerative (chronic) pancolitis without complications: Secondary | ICD-10-CM | POA: Diagnosis not present

## 2021-12-10 DIAGNOSIS — J849 Interstitial pulmonary disease, unspecified: Secondary | ICD-10-CM | POA: Diagnosis not present

## 2021-12-10 DIAGNOSIS — K219 Gastro-esophageal reflux disease without esophagitis: Secondary | ICD-10-CM | POA: Diagnosis not present

## 2021-12-10 DIAGNOSIS — I442 Atrioventricular block, complete: Secondary | ICD-10-CM | POA: Diagnosis not present

## 2021-12-10 DIAGNOSIS — E785 Hyperlipidemia, unspecified: Secondary | ICD-10-CM | POA: Diagnosis not present

## 2021-12-10 DIAGNOSIS — F32A Depression, unspecified: Secondary | ICD-10-CM | POA: Diagnosis not present

## 2021-12-10 DIAGNOSIS — Z7984 Long term (current) use of oral hypoglycemic drugs: Secondary | ICD-10-CM | POA: Diagnosis not present

## 2021-12-10 DIAGNOSIS — Z7901 Long term (current) use of anticoagulants: Secondary | ICD-10-CM | POA: Diagnosis not present

## 2021-12-10 DIAGNOSIS — I160 Hypertensive urgency: Secondary | ICD-10-CM | POA: Diagnosis not present

## 2021-12-10 DIAGNOSIS — Z87891 Personal history of nicotine dependence: Secondary | ICD-10-CM | POA: Diagnosis not present

## 2021-12-10 DIAGNOSIS — Z48812 Encounter for surgical aftercare following surgery on the circulatory system: Secondary | ICD-10-CM | POA: Diagnosis not present

## 2021-12-10 DIAGNOSIS — E1122 Type 2 diabetes mellitus with diabetic chronic kidney disease: Secondary | ICD-10-CM | POA: Diagnosis not present

## 2021-12-10 DIAGNOSIS — Z9049 Acquired absence of other specified parts of digestive tract: Secondary | ICD-10-CM | POA: Diagnosis not present

## 2021-12-10 DIAGNOSIS — G4733 Obstructive sleep apnea (adult) (pediatric): Secondary | ICD-10-CM | POA: Diagnosis not present

## 2021-12-10 DIAGNOSIS — N1832 Chronic kidney disease, stage 3b: Secondary | ICD-10-CM | POA: Diagnosis not present

## 2021-12-10 DIAGNOSIS — C4362 Malignant melanoma of left upper limb, including shoulder: Secondary | ICD-10-CM | POA: Diagnosis not present

## 2021-12-10 DIAGNOSIS — R001 Bradycardia, unspecified: Secondary | ICD-10-CM | POA: Diagnosis not present

## 2021-12-10 DIAGNOSIS — D631 Anemia in chronic kidney disease: Secondary | ICD-10-CM | POA: Diagnosis not present

## 2021-12-12 DIAGNOSIS — E785 Hyperlipidemia, unspecified: Secondary | ICD-10-CM | POA: Diagnosis not present

## 2021-12-12 DIAGNOSIS — Z950 Presence of cardiac pacemaker: Secondary | ICD-10-CM | POA: Diagnosis not present

## 2021-12-12 DIAGNOSIS — K5100 Ulcerative (chronic) pancolitis without complications: Secondary | ICD-10-CM | POA: Diagnosis not present

## 2021-12-12 DIAGNOSIS — R001 Bradycardia, unspecified: Secondary | ICD-10-CM | POA: Diagnosis not present

## 2021-12-12 DIAGNOSIS — E1122 Type 2 diabetes mellitus with diabetic chronic kidney disease: Secondary | ICD-10-CM | POA: Diagnosis not present

## 2021-12-12 DIAGNOSIS — K219 Gastro-esophageal reflux disease without esophagitis: Secondary | ICD-10-CM | POA: Diagnosis not present

## 2021-12-12 DIAGNOSIS — I129 Hypertensive chronic kidney disease with stage 1 through stage 4 chronic kidney disease, or unspecified chronic kidney disease: Secondary | ICD-10-CM | POA: Diagnosis not present

## 2021-12-12 DIAGNOSIS — H401211 Low-tension glaucoma, right eye, mild stage: Secondary | ICD-10-CM | POA: Diagnosis not present

## 2021-12-12 DIAGNOSIS — N1832 Chronic kidney disease, stage 3b: Secondary | ICD-10-CM | POA: Diagnosis not present

## 2021-12-12 DIAGNOSIS — I160 Hypertensive urgency: Secondary | ICD-10-CM | POA: Diagnosis not present

## 2021-12-12 DIAGNOSIS — Z7901 Long term (current) use of anticoagulants: Secondary | ICD-10-CM | POA: Diagnosis not present

## 2021-12-12 DIAGNOSIS — G4733 Obstructive sleep apnea (adult) (pediatric): Secondary | ICD-10-CM | POA: Diagnosis not present

## 2021-12-12 DIAGNOSIS — J849 Interstitial pulmonary disease, unspecified: Secondary | ICD-10-CM | POA: Diagnosis not present

## 2021-12-12 DIAGNOSIS — D631 Anemia in chronic kidney disease: Secondary | ICD-10-CM | POA: Diagnosis not present

## 2021-12-12 DIAGNOSIS — Z86711 Personal history of pulmonary embolism: Secondary | ICD-10-CM | POA: Diagnosis not present

## 2021-12-12 DIAGNOSIS — M1990 Unspecified osteoarthritis, unspecified site: Secondary | ICD-10-CM | POA: Diagnosis not present

## 2021-12-12 DIAGNOSIS — Z7984 Long term (current) use of oral hypoglycemic drugs: Secondary | ICD-10-CM | POA: Diagnosis not present

## 2021-12-12 DIAGNOSIS — Z87891 Personal history of nicotine dependence: Secondary | ICD-10-CM | POA: Diagnosis not present

## 2021-12-12 DIAGNOSIS — F32A Depression, unspecified: Secondary | ICD-10-CM | POA: Diagnosis not present

## 2021-12-12 DIAGNOSIS — Z48812 Encounter for surgical aftercare following surgery on the circulatory system: Secondary | ICD-10-CM | POA: Diagnosis not present

## 2021-12-12 DIAGNOSIS — I442 Atrioventricular block, complete: Secondary | ICD-10-CM | POA: Diagnosis not present

## 2021-12-12 DIAGNOSIS — C4362 Malignant melanoma of left upper limb, including shoulder: Secondary | ICD-10-CM | POA: Diagnosis not present

## 2021-12-12 DIAGNOSIS — Z9049 Acquired absence of other specified parts of digestive tract: Secondary | ICD-10-CM | POA: Diagnosis not present

## 2021-12-25 ENCOUNTER — Other Ambulatory Visit (INDEPENDENT_AMBULATORY_CARE_PROVIDER_SITE_OTHER): Payer: Self-pay | Admitting: *Deleted

## 2021-12-25 MED ORDER — PANTOPRAZOLE SODIUM 40 MG PO TBEC: DELAYED_RELEASE_TABLET | ORAL | 0 refills | Status: DC

## 2021-12-26 DIAGNOSIS — I442 Atrioventricular block, complete: Secondary | ICD-10-CM | POA: Diagnosis not present

## 2021-12-26 DIAGNOSIS — Z48812 Encounter for surgical aftercare following surgery on the circulatory system: Secondary | ICD-10-CM | POA: Diagnosis not present

## 2021-12-26 DIAGNOSIS — N1832 Chronic kidney disease, stage 3b: Secondary | ICD-10-CM | POA: Diagnosis not present

## 2021-12-26 DIAGNOSIS — I160 Hypertensive urgency: Secondary | ICD-10-CM | POA: Diagnosis not present

## 2021-12-26 DIAGNOSIS — R001 Bradycardia, unspecified: Secondary | ICD-10-CM | POA: Diagnosis not present

## 2021-12-26 DIAGNOSIS — Z9049 Acquired absence of other specified parts of digestive tract: Secondary | ICD-10-CM | POA: Diagnosis not present

## 2021-12-26 DIAGNOSIS — E1122 Type 2 diabetes mellitus with diabetic chronic kidney disease: Secondary | ICD-10-CM | POA: Diagnosis not present

## 2021-12-26 DIAGNOSIS — K219 Gastro-esophageal reflux disease without esophagitis: Secondary | ICD-10-CM | POA: Diagnosis not present

## 2021-12-26 DIAGNOSIS — Z87891 Personal history of nicotine dependence: Secondary | ICD-10-CM | POA: Diagnosis not present

## 2021-12-26 DIAGNOSIS — G4733 Obstructive sleep apnea (adult) (pediatric): Secondary | ICD-10-CM | POA: Diagnosis not present

## 2021-12-26 DIAGNOSIS — I129 Hypertensive chronic kidney disease with stage 1 through stage 4 chronic kidney disease, or unspecified chronic kidney disease: Secondary | ICD-10-CM | POA: Diagnosis not present

## 2021-12-26 DIAGNOSIS — K5100 Ulcerative (chronic) pancolitis without complications: Secondary | ICD-10-CM | POA: Diagnosis not present

## 2021-12-26 DIAGNOSIS — D631 Anemia in chronic kidney disease: Secondary | ICD-10-CM | POA: Diagnosis not present

## 2021-12-26 DIAGNOSIS — Z7901 Long term (current) use of anticoagulants: Secondary | ICD-10-CM | POA: Diagnosis not present

## 2021-12-26 DIAGNOSIS — C4362 Malignant melanoma of left upper limb, including shoulder: Secondary | ICD-10-CM | POA: Diagnosis not present

## 2021-12-26 DIAGNOSIS — Z950 Presence of cardiac pacemaker: Secondary | ICD-10-CM | POA: Diagnosis not present

## 2021-12-26 DIAGNOSIS — M1990 Unspecified osteoarthritis, unspecified site: Secondary | ICD-10-CM | POA: Diagnosis not present

## 2021-12-26 DIAGNOSIS — Z7984 Long term (current) use of oral hypoglycemic drugs: Secondary | ICD-10-CM | POA: Diagnosis not present

## 2021-12-26 DIAGNOSIS — F32A Depression, unspecified: Secondary | ICD-10-CM | POA: Diagnosis not present

## 2021-12-26 DIAGNOSIS — J849 Interstitial pulmonary disease, unspecified: Secondary | ICD-10-CM | POA: Diagnosis not present

## 2021-12-26 DIAGNOSIS — E785 Hyperlipidemia, unspecified: Secondary | ICD-10-CM | POA: Diagnosis not present

## 2021-12-26 DIAGNOSIS — Z86711 Personal history of pulmonary embolism: Secondary | ICD-10-CM | POA: Diagnosis not present

## 2021-12-30 DIAGNOSIS — J849 Interstitial pulmonary disease, unspecified: Secondary | ICD-10-CM | POA: Diagnosis not present

## 2021-12-30 DIAGNOSIS — Z950 Presence of cardiac pacemaker: Secondary | ICD-10-CM | POA: Diagnosis not present

## 2021-12-30 DIAGNOSIS — E785 Hyperlipidemia, unspecified: Secondary | ICD-10-CM | POA: Diagnosis not present

## 2021-12-30 DIAGNOSIS — F32A Depression, unspecified: Secondary | ICD-10-CM | POA: Diagnosis not present

## 2021-12-30 DIAGNOSIS — I160 Hypertensive urgency: Secondary | ICD-10-CM | POA: Diagnosis not present

## 2021-12-30 DIAGNOSIS — Z7901 Long term (current) use of anticoagulants: Secondary | ICD-10-CM | POA: Diagnosis not present

## 2021-12-30 DIAGNOSIS — Z7984 Long term (current) use of oral hypoglycemic drugs: Secondary | ICD-10-CM | POA: Diagnosis not present

## 2021-12-30 DIAGNOSIS — Z9049 Acquired absence of other specified parts of digestive tract: Secondary | ICD-10-CM | POA: Diagnosis not present

## 2021-12-30 DIAGNOSIS — D631 Anemia in chronic kidney disease: Secondary | ICD-10-CM | POA: Diagnosis not present

## 2021-12-30 DIAGNOSIS — I442 Atrioventricular block, complete: Secondary | ICD-10-CM | POA: Diagnosis not present

## 2021-12-30 DIAGNOSIS — G4733 Obstructive sleep apnea (adult) (pediatric): Secondary | ICD-10-CM | POA: Diagnosis not present

## 2021-12-30 DIAGNOSIS — M1990 Unspecified osteoarthritis, unspecified site: Secondary | ICD-10-CM | POA: Diagnosis not present

## 2021-12-30 DIAGNOSIS — Z48812 Encounter for surgical aftercare following surgery on the circulatory system: Secondary | ICD-10-CM | POA: Diagnosis not present

## 2021-12-30 DIAGNOSIS — Z87891 Personal history of nicotine dependence: Secondary | ICD-10-CM | POA: Diagnosis not present

## 2021-12-30 DIAGNOSIS — E1122 Type 2 diabetes mellitus with diabetic chronic kidney disease: Secondary | ICD-10-CM | POA: Diagnosis not present

## 2021-12-30 DIAGNOSIS — C4362 Malignant melanoma of left upper limb, including shoulder: Secondary | ICD-10-CM | POA: Diagnosis not present

## 2021-12-30 DIAGNOSIS — K5100 Ulcerative (chronic) pancolitis without complications: Secondary | ICD-10-CM | POA: Diagnosis not present

## 2021-12-30 DIAGNOSIS — R001 Bradycardia, unspecified: Secondary | ICD-10-CM | POA: Diagnosis not present

## 2021-12-30 DIAGNOSIS — Z86711 Personal history of pulmonary embolism: Secondary | ICD-10-CM | POA: Diagnosis not present

## 2021-12-30 DIAGNOSIS — K219 Gastro-esophageal reflux disease without esophagitis: Secondary | ICD-10-CM | POA: Diagnosis not present

## 2021-12-30 DIAGNOSIS — I129 Hypertensive chronic kidney disease with stage 1 through stage 4 chronic kidney disease, or unspecified chronic kidney disease: Secondary | ICD-10-CM | POA: Diagnosis not present

## 2021-12-30 DIAGNOSIS — N1832 Chronic kidney disease, stage 3b: Secondary | ICD-10-CM | POA: Diagnosis not present

## 2022-01-03 DIAGNOSIS — L110 Acquired keratosis follicularis: Secondary | ICD-10-CM | POA: Diagnosis not present

## 2022-01-03 DIAGNOSIS — B351 Tinea unguium: Secondary | ICD-10-CM | POA: Diagnosis not present

## 2022-01-03 DIAGNOSIS — E1140 Type 2 diabetes mellitus with diabetic neuropathy, unspecified: Secondary | ICD-10-CM | POA: Diagnosis not present

## 2022-01-06 DIAGNOSIS — Z7901 Long term (current) use of anticoagulants: Secondary | ICD-10-CM | POA: Diagnosis not present

## 2022-01-06 DIAGNOSIS — K5100 Ulcerative (chronic) pancolitis without complications: Secondary | ICD-10-CM | POA: Diagnosis not present

## 2022-01-06 DIAGNOSIS — Z9049 Acquired absence of other specified parts of digestive tract: Secondary | ICD-10-CM | POA: Diagnosis not present

## 2022-01-06 DIAGNOSIS — D631 Anemia in chronic kidney disease: Secondary | ICD-10-CM | POA: Diagnosis not present

## 2022-01-06 DIAGNOSIS — Z87891 Personal history of nicotine dependence: Secondary | ICD-10-CM | POA: Diagnosis not present

## 2022-01-06 DIAGNOSIS — I129 Hypertensive chronic kidney disease with stage 1 through stage 4 chronic kidney disease, or unspecified chronic kidney disease: Secondary | ICD-10-CM | POA: Diagnosis not present

## 2022-01-06 DIAGNOSIS — N1832 Chronic kidney disease, stage 3b: Secondary | ICD-10-CM | POA: Diagnosis not present

## 2022-01-06 DIAGNOSIS — Z48812 Encounter for surgical aftercare following surgery on the circulatory system: Secondary | ICD-10-CM | POA: Diagnosis not present

## 2022-01-06 DIAGNOSIS — Z7984 Long term (current) use of oral hypoglycemic drugs: Secondary | ICD-10-CM | POA: Diagnosis not present

## 2022-01-06 DIAGNOSIS — I160 Hypertensive urgency: Secondary | ICD-10-CM | POA: Diagnosis not present

## 2022-01-06 DIAGNOSIS — Z950 Presence of cardiac pacemaker: Secondary | ICD-10-CM | POA: Diagnosis not present

## 2022-01-06 DIAGNOSIS — G4733 Obstructive sleep apnea (adult) (pediatric): Secondary | ICD-10-CM | POA: Diagnosis not present

## 2022-01-06 DIAGNOSIS — J849 Interstitial pulmonary disease, unspecified: Secondary | ICD-10-CM | POA: Diagnosis not present

## 2022-01-06 DIAGNOSIS — R001 Bradycardia, unspecified: Secondary | ICD-10-CM | POA: Diagnosis not present

## 2022-01-06 DIAGNOSIS — I442 Atrioventricular block, complete: Secondary | ICD-10-CM | POA: Diagnosis not present

## 2022-01-06 DIAGNOSIS — M1990 Unspecified osteoarthritis, unspecified site: Secondary | ICD-10-CM | POA: Diagnosis not present

## 2022-01-06 DIAGNOSIS — F32A Depression, unspecified: Secondary | ICD-10-CM | POA: Diagnosis not present

## 2022-01-06 DIAGNOSIS — E1122 Type 2 diabetes mellitus with diabetic chronic kidney disease: Secondary | ICD-10-CM | POA: Diagnosis not present

## 2022-01-06 DIAGNOSIS — Z86711 Personal history of pulmonary embolism: Secondary | ICD-10-CM | POA: Diagnosis not present

## 2022-01-06 DIAGNOSIS — E785 Hyperlipidemia, unspecified: Secondary | ICD-10-CM | POA: Diagnosis not present

## 2022-01-06 DIAGNOSIS — C4362 Malignant melanoma of left upper limb, including shoulder: Secondary | ICD-10-CM | POA: Diagnosis not present

## 2022-01-06 DIAGNOSIS — K219 Gastro-esophageal reflux disease without esophagitis: Secondary | ICD-10-CM | POA: Diagnosis not present

## 2022-01-28 ENCOUNTER — Other Ambulatory Visit: Payer: Self-pay | Admitting: Pulmonary Disease

## 2022-01-31 ENCOUNTER — Ambulatory Visit (INDEPENDENT_AMBULATORY_CARE_PROVIDER_SITE_OTHER): Payer: Medicare Other

## 2022-01-31 DIAGNOSIS — I4430 Unspecified atrioventricular block: Secondary | ICD-10-CM | POA: Diagnosis not present

## 2022-02-03 LAB — CUP PACEART REMOTE DEVICE CHECK
Date Time Interrogation Session: 20230323065500
Implantable Lead Implant Date: 20221222
Implantable Lead Implant Date: 20221222
Implantable Lead Location: 753858
Implantable Lead Location: 753859
Implantable Lead Model: 377171
Implantable Lead Model: 377171
Implantable Lead Serial Number: 8000640340
Implantable Lead Serial Number: 8000648576
Implantable Pulse Generator Implant Date: 20221222
Pulse Gen Model: 407145
Pulse Gen Serial Number: 70300615

## 2022-02-05 NOTE — Progress Notes (Signed)
Remote pacemaker transmission.   

## 2022-02-06 DIAGNOSIS — D519 Vitamin B12 deficiency anemia, unspecified: Secondary | ICD-10-CM | POA: Diagnosis not present

## 2022-02-06 DIAGNOSIS — D529 Folate deficiency anemia, unspecified: Secondary | ICD-10-CM | POA: Diagnosis not present

## 2022-02-06 DIAGNOSIS — Z0001 Encounter for general adult medical examination with abnormal findings: Secondary | ICD-10-CM | POA: Diagnosis not present

## 2022-02-06 DIAGNOSIS — D649 Anemia, unspecified: Secondary | ICD-10-CM | POA: Diagnosis not present

## 2022-02-06 DIAGNOSIS — I10 Essential (primary) hypertension: Secondary | ICD-10-CM | POA: Diagnosis not present

## 2022-02-06 DIAGNOSIS — E1122 Type 2 diabetes mellitus with diabetic chronic kidney disease: Secondary | ICD-10-CM | POA: Diagnosis not present

## 2022-02-06 DIAGNOSIS — Z1329 Encounter for screening for other suspected endocrine disorder: Secondary | ICD-10-CM | POA: Diagnosis not present

## 2022-02-11 DIAGNOSIS — I2699 Other pulmonary embolism without acute cor pulmonale: Secondary | ICD-10-CM | POA: Diagnosis not present

## 2022-02-11 DIAGNOSIS — K7581 Nonalcoholic steatohepatitis (NASH): Secondary | ICD-10-CM | POA: Diagnosis not present

## 2022-02-11 DIAGNOSIS — K5100 Ulcerative (chronic) pancolitis without complications: Secondary | ICD-10-CM | POA: Diagnosis not present

## 2022-02-11 DIAGNOSIS — G43909 Migraine, unspecified, not intractable, without status migrainosus: Secondary | ICD-10-CM | POA: Diagnosis not present

## 2022-02-11 DIAGNOSIS — D692 Other nonthrombocytopenic purpura: Secondary | ICD-10-CM | POA: Diagnosis not present

## 2022-02-11 DIAGNOSIS — R4582 Worries: Secondary | ICD-10-CM | POA: Diagnosis not present

## 2022-02-11 DIAGNOSIS — E1122 Type 2 diabetes mellitus with diabetic chronic kidney disease: Secondary | ICD-10-CM | POA: Diagnosis not present

## 2022-02-11 DIAGNOSIS — I10 Essential (primary) hypertension: Secondary | ICD-10-CM | POA: Diagnosis not present

## 2022-02-13 ENCOUNTER — Ambulatory Visit: Payer: Medicare Other | Admitting: Internal Medicine

## 2022-02-13 ENCOUNTER — Encounter: Payer: Self-pay | Admitting: Internal Medicine

## 2022-02-13 VITALS — BP 122/74 | HR 76 | Ht 71.0 in | Wt 201.0 lb

## 2022-02-13 DIAGNOSIS — I4430 Unspecified atrioventricular block: Secondary | ICD-10-CM

## 2022-02-13 DIAGNOSIS — Z950 Presence of cardiac pacemaker: Secondary | ICD-10-CM | POA: Diagnosis not present

## 2022-02-13 LAB — CUP PACEART INCLINIC DEVICE CHECK
Date Time Interrogation Session: 20230406161616
Implantable Lead Implant Date: 20221222
Implantable Lead Implant Date: 20221222
Implantable Lead Location: 753858
Implantable Lead Location: 753859
Implantable Lead Model: 377171
Implantable Lead Model: 377171
Implantable Lead Serial Number: 8000640340
Implantable Lead Serial Number: 8000648576
Implantable Pulse Generator Implant Date: 20221222
Pulse Gen Model: 407145
Pulse Gen Serial Number: 70300615

## 2022-02-13 NOTE — Patient Instructions (Signed)
Medication Instructions:  ?Your physician recommends that you continue on your current medications as directed. Please refer to the Current Medication list given to you today. ? ?Labwork: ?None ordered. ? ?Testing/Procedures: ?None ordered. ? ?Follow-Up: ?Your physician wants you to follow-up in: one year with Cristopher Peru, MD or one of the following Advanced Practice Providers on your designated Care Team:   ?Tommye Standard, PA-C ?Legrand Como "Jonni Sanger" Winslow, PA-C ? ?Remote monitoring is used to monitor your Pacemaker from home. This monitoring reduces the number of office visits required to check your device to one time per year. It allows [place] to keep an eye on the functioning of your device to ensure it is working properly. You are scheduled for a device check from home on 05/02/2022. You may send your transmission at any time that day. If you have a wireless device, the transmission will be sent automatically. After your physician reviews your transmission, you will receive a postcard with your next transmission date. ? ?Any Other Special Instructions Will Be Listed Below (If Applicable). ? ?If you need a refill on your cardiac medications before your next appointment, please call your pharmacy.  ? ? ? ? ?
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
## 2022-02-25 DIAGNOSIS — Z85820 Personal history of malignant melanoma of skin: Secondary | ICD-10-CM | POA: Diagnosis not present

## 2022-02-25 DIAGNOSIS — X32XXXD Exposure to sunlight, subsequent encounter: Secondary | ICD-10-CM | POA: Diagnosis not present

## 2022-02-25 DIAGNOSIS — Z08 Encounter for follow-up examination after completed treatment for malignant neoplasm: Secondary | ICD-10-CM | POA: Diagnosis not present

## 2022-02-25 DIAGNOSIS — L570 Actinic keratosis: Secondary | ICD-10-CM | POA: Diagnosis not present

## 2022-02-25 DIAGNOSIS — Z1283 Encounter for screening for malignant neoplasm of skin: Secondary | ICD-10-CM | POA: Diagnosis not present

## 2022-03-05 ENCOUNTER — Other Ambulatory Visit (INDEPENDENT_AMBULATORY_CARE_PROVIDER_SITE_OTHER): Payer: Self-pay | Admitting: Internal Medicine

## 2022-03-12 DIAGNOSIS — E1140 Type 2 diabetes mellitus with diabetic neuropathy, unspecified: Secondary | ICD-10-CM | POA: Diagnosis not present

## 2022-03-12 DIAGNOSIS — B351 Tinea unguium: Secondary | ICD-10-CM | POA: Diagnosis not present

## 2022-03-12 DIAGNOSIS — L110 Acquired keratosis follicularis: Secondary | ICD-10-CM | POA: Diagnosis not present

## 2022-05-02 ENCOUNTER — Ambulatory Visit (INDEPENDENT_AMBULATORY_CARE_PROVIDER_SITE_OTHER): Payer: Medicare Other

## 2022-05-02 DIAGNOSIS — I4430 Unspecified atrioventricular block: Secondary | ICD-10-CM | POA: Diagnosis not present

## 2022-05-06 LAB — CUP PACEART REMOTE DEVICE CHECK
Date Time Interrogation Session: 20230623075320
Implantable Lead Implant Date: 20221222
Implantable Lead Implant Date: 20221222
Implantable Lead Location: 753858
Implantable Lead Location: 753859
Implantable Lead Model: 377171
Implantable Lead Model: 377171
Implantable Lead Serial Number: 8000640340
Implantable Lead Serial Number: 8000648576
Implantable Pulse Generator Implant Date: 20221222
Pulse Gen Model: 407145
Pulse Gen Serial Number: 70300615

## 2022-05-07 NOTE — Progress Notes (Signed)
Remote pacemaker transmission.   

## 2022-05-21 DIAGNOSIS — B351 Tinea unguium: Secondary | ICD-10-CM | POA: Diagnosis not present

## 2022-05-21 DIAGNOSIS — L110 Acquired keratosis follicularis: Secondary | ICD-10-CM | POA: Diagnosis not present

## 2022-05-21 DIAGNOSIS — E1140 Type 2 diabetes mellitus with diabetic neuropathy, unspecified: Secondary | ICD-10-CM | POA: Diagnosis not present

## 2022-06-09 DIAGNOSIS — R319 Hematuria, unspecified: Secondary | ICD-10-CM | POA: Diagnosis not present

## 2022-06-09 DIAGNOSIS — R300 Dysuria: Secondary | ICD-10-CM | POA: Diagnosis not present

## 2022-06-12 DIAGNOSIS — R319 Hematuria, unspecified: Secondary | ICD-10-CM | POA: Diagnosis not present

## 2022-06-12 DIAGNOSIS — N1832 Chronic kidney disease, stage 3b: Secondary | ICD-10-CM | POA: Diagnosis not present

## 2022-06-12 DIAGNOSIS — N390 Urinary tract infection, site not specified: Secondary | ICD-10-CM | POA: Diagnosis not present

## 2022-06-18 ENCOUNTER — Encounter: Payer: Self-pay | Admitting: *Deleted

## 2022-06-23 ENCOUNTER — Ambulatory Visit (HOSPITAL_COMMUNITY)
Admission: RE | Admit: 2022-06-23 | Discharge: 2022-06-23 | Disposition: A | Discharge status: Home / Self Care | Payer: Medicare Other | Source: Ambulatory Visit | Attending: Urology | Admitting: Urology

## 2022-06-23 ENCOUNTER — Encounter: Payer: Self-pay | Admitting: Urology

## 2022-06-23 ENCOUNTER — Ambulatory Visit (INDEPENDENT_AMBULATORY_CARE_PROVIDER_SITE_OTHER): Payer: Medicare Other | Admitting: Urology

## 2022-06-23 VITALS — BP 96/57 | HR 97 | Ht 71.5 in | Wt 204.0 lb

## 2022-06-23 DIAGNOSIS — N210 Calculus in bladder: Secondary | ICD-10-CM

## 2022-06-23 DIAGNOSIS — N200 Calculus of kidney: Secondary | ICD-10-CM | POA: Diagnosis not present

## 2022-06-23 DIAGNOSIS — R319 Hematuria, unspecified: Secondary | ICD-10-CM | POA: Diagnosis not present

## 2022-06-23 DIAGNOSIS — R310 Gross hematuria: Secondary | ICD-10-CM | POA: Diagnosis not present
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
## 2022-06-23 NOTE — Progress Notes (Signed)
STAT CT stone study scheduled today for 1:15 [facility] arrival time.  Per [employer]/medicare no PA needed. Patient aware to go to [facility] Radiology at 1:15 and to return to office once CT is completed.

## 2022-06-24 LAB — MICROSCOPIC EXAMINATION

## 2022-06-24 LAB — URINALYSIS, ROUTINE W REFLEX MICROSCOPIC
Bilirubin, UA: NEGATIVE
Leukocytes,UA: NEGATIVE
Nitrite, UA: NEGATIVE
RBC, UA: NEGATIVE
Specific Gravity, UA: 1.025 (ref 1.005–1.030)
Urobilinogen, Ur: 0.2 mg/dL (ref 0.2–1.0)
pH, UA: 5 (ref 5.0–7.5)

## 2022-06-26 ENCOUNTER — Telehealth: Payer: Self-pay

## 2022-06-26 NOTE — Telephone Encounter (Signed)
I spoke with patient wife- waiting for medical clearance from Dr. Quillian Quince office per Dr. Alyson Ingles posting request. Wife voiced understanding.  Medical letter sent to PCP.

## 2022-06-26 NOTE — Telephone Encounter (Signed)
Patient left a voice message 06-26-2022.  Wanting to know about his upcoming surgery/details.  He hasn't received a call.  Please advise.  Call back:  [phone]  Thanks, Helene Kelp

## 2022-06-30 NOTE — Telephone Encounter (Signed)
Wife called this morning.  Said Dr. Quillian Quince office has not received the form to move forward with procedure for pt.  Asking the form to be sent asap.  Thanks, Helene Kelp

## 2022-06-30 NOTE — Telephone Encounter (Signed)
Faxed letter to Dr. Olena Heckle office for medical clearance.

## 2022-07-03 NOTE — Telephone Encounter (Signed)
Patient reports letter not received by PCP by surgical clearance.  Patient did have Dr.Daniel fax letter to our office for surgical approval.  Letter forwarded to Dr. Alyson Ingles.

## 2022-07-03 NOTE — Telephone Encounter (Signed)
I spoke with Manuel Atkins. We have discussed possible surgery dates and 07/17/2022 was agreed upon by all parties. Patient given information about surgery date, what to expect pre-operatively and post operatively.    We discussed that a pre-op nurse will be calling to set up the pre-op visit that will take place prior to surgery. Informed patient that our office will communicate any additional care to be provided after surgery.    Patients questions or concerns were discussed during our call. Advised to call our office should there be any additional information, questions or concerns that arise. Patient verbalized understanding.

## 2022-07-04 ENCOUNTER — Encounter: Payer: Self-pay | Admitting: *Deleted

## 2022-07-04 ENCOUNTER — Telehealth: Payer: Self-pay | Admitting: *Deleted

## 2022-07-04 NOTE — Patient Outreach (Signed)
  Care Coordination   Initial Visit Note   07/04/2022 Name: Manuel Atkins MRN: [id number] DOB: Jun 01, 1933  Manuel Atkins is a 88 y.o. year old male who sees [facility], [facility] for primary care. I  Spoke with wife, Dalene Seltzer, by telephone.  What matters to the patients health and wellness today? Ongoing ability to manage medical conditions and medications   Goals Addressed             This Visit's Progress    Care Coordination Services (no follow-up required)       Care Coordination Interventions: Reviewed medications with patient and discussed cost and ability to afford medications Provided patient and/or caregiver with verbal information about [place] (community resource) Assessed social determinant of health barriers Discussed Medicare AWV. Patient is scheduled at PCP office for Sept 2023        SDOH assessments and interventions completed:  Yes  SDOH Interventions Today    Flowsheet Row Most Recent Value  SDOH Interventions   Financial Strain Interventions Intervention Not Indicated  Housing Interventions Intervention Not Indicated  Transportation Interventions Intervention Not Indicated        Care Coordination Interventions Activated:  Yes  Care Coordination Interventions:  Yes, provided   Follow up plan: No further intervention required.   Encounter Outcome:  Pt. Visit Completed   Chong Sicilian, BSN, RN-BC RN Care Coordinator Direct Dial: [phone]

## 2022-07-08 ENCOUNTER — Other Ambulatory Visit: Payer: Self-pay | Admitting: Pulmonary Disease

## 2022-07-11 NOTE — Patient Instructions (Signed)
DANISH RUFFINS  07/11/2022     @PREFPERIOPPHARMACY @   Your procedure is scheduled on  07/17/2022.   Report to [facility] at  1015  A.M.   Call this number if you have problems the morning of surgery:  [phone]   Remember:  Do not eat or drink after midnight.           DO NOT take any medications for diabetes the morning of your procedure.     Take these medicines the morning of surgery with A SIP OF WATER                     xanax(if needed), norvasc, coreg, claritin, protonix.     Do not wear jewelry, make-up or nail polish.  Do not wear lotions, powders, or perfumes, or deodorant.  Do not shave 48 hours prior to surgery.  Men may shave face and neck.  Do not bring valuables to the hospital.  [facility] is not responsible for any belongings or valuables.  Contacts, dentures or bridgework may not be worn into surgery.  Leave your suitcase in the car.  After surgery it may be brought to your room.  For patients admitted to the hospital, discharge time will be determined by your treatment team.  Patients discharged the day of surgery will not be allowed to drive home and must have someone with them for 24 hours.     Special instructions:   DO NOT smoke tobacco or vape for 24 hours before your procedure.  Please read over the following fact sheets that you were given. Coughing and Deep Breathing, Surgical Site Infection Prevention, Anesthesia Post-op Instructions, and Care and Recovery After Surgery      Laser Therapy for Kidney Stones, Care After This sheet gives you information about how to care for yourself after your procedure. Your health care provider may also give you more specific instructions. If you have problems or questions, contact your health care provider. What can I expect after the procedure? After the procedure, it is common to have: Pain. A burning sensation while urinating. Small amounts of blood in your urine. A need to urinate  frequently. Pieces of kidney stone in your urine. Mild discomfort when urinating that may be felt in the back. You may experience this if you have a flexible tube (stent) in your ureter. Follow these instructions at home:  Medicines Take over-the-counter and prescription medicines only as told by your health care provider. If you were prescribed an antibiotic medicine, take it as told by your health care provider. Do not stop taking the antibiotic even if you start to feel better. Ask your health care provider if the medicine prescribed to you: Requires you to avoid driving or using heavy machinery. Can cause constipation. You may need to take actions to prevent or treat constipation, such as: Take over-the-counter or prescription medicines. Eat foods that are high in fiber, such as beans, whole grains, and fresh fruits and vegetables. Limit foods that are high in fat and processed sugars, such as fried or sweet foods. Activity Return to your normal activities as told by your health care provider. Ask your health care provider what activities are safe for you. Do not drive for 24 hours if you were given a sedative during your procedure. General instructions If your health care provider approves, you may take a warm bath to ease discomfort and burning. Drink enough fluid to keep your  urine pale yellow. Your health care provider may recommend drinking two 8 oz (237 mL) glasses of water per hour for a few hours after your procedure. You may be asked to strain your urine to collect any stone fragments that you pass. These fragments may be tested. Keep all follow-up visits as told by your health care provider. This is important. If you have a stent, you will need to return to your health care provider to have the stent removed. Contact a health care provider if you: Have pain or a burning feeling that lasts more than 2 days. Feel nauseous. Vomit more and more often. Have difficulty  urinating. Have pain that gets worse or does not get better with medicine. Get help right away if: You are unable to urinate, even if your bladder feels full. You have: Bright red blood or blood clots in your urine. More blood in your urine. Severe pain or discomfort. A fever or shaking chills. Abdominal pain. Difficulty breathing. Swelling in your legs. This information is not intended to replace advice given to you by your health care provider. Make sure you discuss any questions you have with your health care provider. Document Revised: 03/05/2022 Document Reviewed: 07/01/2021 Elsevier Patient Education  [place] Anesthesia, Adult, Care After The following information offers guidance on how to care for yourself after your procedure. Your health care provider may also give you more specific instructions. If you have problems or questions, contact your health care provider. What can I expect after the procedure? After the procedure, it is common for people to: Have pain or discomfort at the IV site. Have nausea or vomiting. Have a sore throat or hoarseness. Have trouble concentrating. Feel cold or chills. Feel weak, sleepy, or tired (fatigue). Have soreness and body aches. These can affect parts of the body that were not involved in surgery. Follow these instructions at home: For the time period you were told by your health care provider:  Rest. Do not participate in activities where you could fall or become injured. Do not drive or use machinery. Do not drink alcohol. Do not take sleeping pills or medicines that cause drowsiness. Do not make important decisions or sign legal documents. Do not take care of children on your own. General instructions Drink enough fluid to keep your urine pale yellow. If you have sleep apnea, surgery and certain medicines can increase your risk for breathing problems. Follow instructions from your health care provider about  wearing your sleep device: Anytime you are sleeping, including during daytime naps. While taking prescription pain medicines, sleeping medicines, or medicines that make you drowsy. Return to your normal activities as told by your health care provider. Ask your health care provider what activities are safe for you. Take over-the-counter and prescription medicines only as told by your health care provider. Do not use any products that contain nicotine or tobacco. These products include cigarettes, chewing tobacco, and vaping devices, such as e-cigarettes. These can delay incision healing after surgery. If you need help quitting, ask your health care provider. Contact a health care provider if: You have nausea or vomiting that does not get better with medicine. You vomit every time you eat or drink. You have pain that does not get better with medicine. You cannot urinate or have bloody urine. You develop a skin rash. You have a fever. Get help right away if: You have trouble breathing. You have chest pain. You vomit blood. These symptoms may be an emergency.  Get help right away. Call 911. Do not wait to see if the symptoms will go away. Do not drive yourself to the hospital. Summary After the procedure, it is common to have a sore throat, hoarseness, nausea, vomiting, or to feel weak, sleepy, or fatigue. For the time period you were told by your health care provider, do not drive or use machinery. Get help right away if you have difficulty breathing, have chest pain, or vomit blood. These symptoms may be an emergency. This information is not intended to replace advice given to you by your health care provider. Make sure you discuss any questions you have with your health care provider. Document Revised: 01/24/2022 Document Reviewed: 01/24/2022 Elsevier Patient Education  [place].

## 2022-07-15 ENCOUNTER — Encounter (HOSPITAL_COMMUNITY): Payer: Self-pay

## 2022-07-15 ENCOUNTER — Encounter (HOSPITAL_COMMUNITY)
Admission: RE | Admit: 2022-07-15 | Discharge: 2022-07-15 | Disposition: A | Discharge status: Home / Self Care | Payer: Medicare Other | Source: Ambulatory Visit | Attending: Urology | Admitting: Urology

## 2022-07-15 VITALS — BP 146/65 | HR 88 | Temp 97.7°F | Resp 18 | Ht 71.5 in | Wt 203.9 lb

## 2022-07-15 DIAGNOSIS — E1169 Type 2 diabetes mellitus with other specified complication: Secondary | ICD-10-CM

## 2022-07-15 DIAGNOSIS — E785 Hyperlipidemia, unspecified: Secondary | ICD-10-CM | POA: Diagnosis not present

## 2022-07-15 DIAGNOSIS — Z01812 Encounter for preprocedural laboratory examination: Secondary | ICD-10-CM | POA: Diagnosis not present

## 2022-07-15 DIAGNOSIS — D696 Thrombocytopenia, unspecified: Secondary | ICD-10-CM | POA: Diagnosis not present

## 2022-07-15 HISTORY — DX: Cardiac arrhythmia, unspecified: I49.9

## 2022-07-15 HISTORY — DX: Presence of cardiac pacemaker: Z95.0

## 2022-07-15 LAB — CBC WITH DIFFERENTIAL/PLATELET
Abs Immature Granulocytes: 0.01 10*3/uL (ref 0.00–0.07)
Basophils Absolute: 0.1 10*3/uL (ref 0.0–0.1)
Basophils Relative: 1 %
Eosinophils Absolute: 0.1 10*3/uL (ref 0.0–0.5)
Eosinophils Relative: 1 %
HCT: 38.2 % — ABNORMAL LOW (ref 39.0–52.0)
Hemoglobin: 12.7 g/dL — ABNORMAL LOW (ref 13.0–17.0)
Immature Granulocytes: 0 %
Lymphocytes Relative: 23 %
Lymphs Abs: 1.3 10*3/uL (ref 0.7–4.0)
MCH: 32 pg (ref 26.0–34.0)
MCHC: 33.2 g/dL (ref 30.0–36.0)
MCV: 96.2 fL (ref 80.0–100.0)
Monocytes Absolute: 0.7 10*3/uL (ref 0.1–1.0)
Monocytes Relative: 12 %
Neutro Abs: 3.6 10*3/uL (ref 1.7–7.7)
Neutrophils Relative %: 63 %
Platelets: 163 10*3/uL (ref 150–400)
RBC: 3.97 MIL/uL — ABNORMAL LOW (ref 4.22–5.81)
RDW: 13.2 % (ref 11.5–15.5)
WBC: 5.8 10*3/uL (ref 4.0–10.5)
nRBC: 0 % (ref 0.0–0.2)

## 2022-07-15 LAB — BASIC METABOLIC PANEL
Anion gap: 10 (ref 5–15)
BUN: 28 mg/dL — ABNORMAL HIGH (ref 8–23)
CO2: 22 mmol/L (ref 22–32)
Calcium: 8.6 mg/dL — ABNORMAL LOW (ref 8.9–10.3)
Chloride: 107 mmol/L (ref 98–111)
Creatinine, Ser: 1.67 mg/dL — ABNORMAL HIGH (ref 0.61–1.24)
GFR, Estimated: 39 mL/min — ABNORMAL LOW (ref >60)
Glucose, Bld: 288 mg/dL — ABNORMAL HIGH (ref 70–99)
Potassium: 4.5 mmol/L (ref 3.5–5.1)
Sodium: 139 mmol/L (ref 135–145)

## 2022-07-15 LAB — HEMOGLOBIN A1C
Hgb A1c MFr Bld: 8.1 % — ABNORMAL HIGH (ref 4.8–5.6)
Mean Plasma Glucose: 185.77 mg/dL

## 2022-07-17 ENCOUNTER — Ambulatory Visit (HOSPITAL_COMMUNITY)
Admission: RE | Admit: 2022-07-17 | Discharge: 2022-07-17 | Disposition: A | Discharge status: Home / Self Care | Payer: Medicare Other | Source: Ambulatory Visit | Attending: Urology | Admitting: Urology

## 2022-07-17 ENCOUNTER — Ambulatory Visit (HOSPITAL_COMMUNITY): Payer: Medicare Other | Admitting: Anesthesiology

## 2022-07-17 ENCOUNTER — Encounter (HOSPITAL_COMMUNITY): Payer: Self-pay | Admitting: Urology

## 2022-07-17 ENCOUNTER — Ambulatory Visit (HOSPITAL_BASED_OUTPATIENT_CLINIC_OR_DEPARTMENT_OTHER): Payer: Medicare Other | Admitting: Anesthesiology

## 2022-07-17 ENCOUNTER — Encounter (HOSPITAL_COMMUNITY)
Admission: RE | Disposition: A | Discharge status: Home / Self Care | Payer: Self-pay | Source: Ambulatory Visit | Attending: Urology

## 2022-07-17 ENCOUNTER — Ambulatory Visit (HOSPITAL_COMMUNITY): Payer: Medicare Other

## 2022-07-17 ENCOUNTER — Other Ambulatory Visit: Payer: Self-pay

## 2022-07-17 DIAGNOSIS — N400 Enlarged prostate without lower urinary tract symptoms: Secondary | ICD-10-CM | POA: Insufficient documentation

## 2022-07-17 DIAGNOSIS — Z950 Presence of cardiac pacemaker: Secondary | ICD-10-CM | POA: Insufficient documentation

## 2022-07-17 DIAGNOSIS — E119 Type 2 diabetes mellitus without complications: Secondary | ICD-10-CM

## 2022-07-17 DIAGNOSIS — I10 Essential (primary) hypertension: Secondary | ICD-10-CM

## 2022-07-17 DIAGNOSIS — G4733 Obstructive sleep apnea (adult) (pediatric): Secondary | ICD-10-CM | POA: Insufficient documentation

## 2022-07-17 DIAGNOSIS — Z87891 Personal history of nicotine dependence: Secondary | ICD-10-CM | POA: Insufficient documentation

## 2022-07-17 DIAGNOSIS — Z79899 Other long term (current) drug therapy: Secondary | ICD-10-CM | POA: Insufficient documentation

## 2022-07-17 DIAGNOSIS — Z7984 Long term (current) use of oral hypoglycemic drugs: Secondary | ICD-10-CM | POA: Insufficient documentation

## 2022-07-17 DIAGNOSIS — K219 Gastro-esophageal reflux disease without esophagitis: Secondary | ICD-10-CM | POA: Diagnosis not present

## 2022-07-17 DIAGNOSIS — N200 Calculus of kidney: Secondary | ICD-10-CM | POA: Diagnosis not present

## 2022-07-17 DIAGNOSIS — N210 Calculus in bladder: Secondary | ICD-10-CM | POA: Insufficient documentation

## 2022-07-17 HISTORY — PX: CYSTOSCOPY WITH LITHOLAPAXY: SHX1425

## 2022-07-17 HISTORY — PX: HOLMIUM LASER APPLICATION: SHX5852

## 2022-07-17 LAB — GLUCOSE, CAPILLARY: Glucose-Capillary: 184 mg/dL — ABNORMAL HIGH (ref 70–99)

## 2022-07-17 SURGERY — CYSTOSCOPY, WITH BLADDER CALCULUS LITHOLAPAXY
Anesthesia: General | Site: Bladder

## 2022-07-17 MED ORDER — CEFAZOLIN SODIUM-DEXTROSE 2-4 GM/100ML-% IV SOLN
2.0000 g | INTRAVENOUS | Status: AC
  Administered 2022-07-17: 2 g via INTRAVENOUS

## 2022-07-17 MED ORDER — FENTANYL CITRATE (PF) 100 MCG/2ML IJ SOLN
INTRAMUSCULAR | Status: AC
  Filled 2022-07-17: qty 2

## 2022-07-17 MED ORDER — EPHEDRINE SULFATE (PRESSORS) 50 MG/ML IJ SOLN
INTRAMUSCULAR | Status: DC | PRN
  Administered 2022-07-17 (×2): 5 mg via INTRAVENOUS

## 2022-07-17 MED ORDER — SODIUM CHLORIDE 0.9 % IR SOLN
Status: DC | PRN
  Administered 2022-07-17: 3000 mL via INTRAVESICAL

## 2022-07-17 MED ORDER — LACTATED RINGERS IV SOLN: INTRAVENOUS | Status: DC

## 2022-07-17 MED ORDER — CEFAZOLIN SODIUM-DEXTROSE 2-4 GM/100ML-% IV SOLN
INTRAVENOUS | Status: AC
  Filled 2022-07-17: qty 100

## 2022-07-17 MED ORDER — ORAL CARE MOUTH RINSE: 15.0000 mL | Freq: Once | OROMUCOSAL | Status: AC

## 2022-07-17 MED ORDER — ONDANSETRON HCL 4 MG/2ML IJ SOLN: 4.0000 mg | Freq: Once | INTRAMUSCULAR | Status: DC | PRN

## 2022-07-17 MED ORDER — TRAMADOL HCL 50 MG PO TABS: 50.0000 mg | ORAL_TABLET | Freq: Four times a day (QID) | ORAL | 0 refills | Status: DC | PRN

## 2022-07-17 MED ORDER — PHENYLEPHRINE HCL (PRESSORS) 10 MG/ML IV SOLN
INTRAVENOUS | Status: DC | PRN
  Administered 2022-07-17 (×2): 160 ug via INTRAVENOUS

## 2022-07-17 MED ORDER — CHLORHEXIDINE GLUCONATE 0.12 % MT SOLN
15.0000 mL | Freq: Once | OROMUCOSAL | Status: AC
  Administered 2022-07-17: 15 mL via OROMUCOSAL

## 2022-07-17 MED ORDER — WATER FOR IRRIGATION, STERILE IR SOLN
Status: DC | PRN
  Administered 2022-07-17: 1000 mL via SURGICAL_CAVITY

## 2022-07-17 MED ORDER — ONDANSETRON HCL 4 MG/2ML IJ SOLN
INTRAMUSCULAR | Status: DC | PRN
  Administered 2022-07-17: 4 mg via INTRAVENOUS

## 2022-07-17 MED ORDER — FENTANYL CITRATE (PF) 100 MCG/2ML IJ SOLN
INTRAMUSCULAR | Status: DC | PRN
Start: 2022-07-17 — End: 2022-07-17
  Administered 2022-07-17 (×2): 50 ug via INTRAVENOUS

## 2022-07-17 MED ORDER — PROPOFOL 10 MG/ML IV BOLUS
INTRAVENOUS | Status: DC | PRN
  Administered 2022-07-17 (×4): 20 mg via INTRAVENOUS
  Administered 2022-07-17: 100 mg via INTRAVENOUS
  Administered 2022-07-17: 20 mg via INTRAVENOUS

## 2022-07-17 MED ORDER — PROPOFOL 10 MG/ML IV BOLUS
INTRAVENOUS | Status: AC
  Filled 2022-07-17: qty 20

## 2022-07-17 MED ORDER — DIATRIZOATE MEGLUMINE 30 % UR SOLN
URETHRAL | Status: AC
  Filled 2022-07-17: qty 100

## 2022-07-17 MED ORDER — HYDROMORPHONE HCL 1 MG/ML IJ SOLN: 0.2500 mg | INTRAMUSCULAR | Status: DC | PRN

## 2022-07-17 SURGICAL SUPPLY — 17 items
BAG DRAIN URO TABLE W/ADPT NS (BAG) ×1 IMPLANT
BAG DRN 8 ADPR NS SKTRN CSTL (BAG) ×1
BAG DRN RND TRDRP ANRFLXCHMBR (UROLOGICAL SUPPLIES)
BAG HAMPER (MISCELLANEOUS) ×1 IMPLANT
BAG URINE DRAIN 2000ML AR STRL (UROLOGICAL SUPPLIES) ×1 IMPLANT
CATH FOLEY 2WAY SLVR  5CC 18FR (CATHETERS)
CATH FOLEY 2WAY SLVR 5CC 18FR (CATHETERS) ×1 IMPLANT
GLOVE BIO SURGEON STRL SZ8 (GLOVE) ×1 IMPLANT
GLOVE BIOGEL PI IND STRL 7.0 (GLOVE) ×2 IMPLANT
GOWN STRL REUS W/TWL LRG LVL3 (GOWN DISPOSABLE) ×1 IMPLANT
GOWN STRL REUS W/TWL XL LVL3 (GOWN DISPOSABLE) ×1 IMPLANT
IV NS IRRIG 3000ML ARTHROMATIC (IV SOLUTION) ×1 IMPLANT
KIT TURNOVER CYSTO (KITS) ×1 IMPLANT
LASER FIBER DISP 1000U (UROLOGICAL SUPPLIES) IMPLANT
PACK CYSTO (CUSTOM PROCEDURE TRAY) ×1 IMPLANT
PAD ARMBOARD 7.5X6 YLW CONV (MISCELLANEOUS) ×1 IMPLANT
WATER STERILE IRR 500ML POUR (IV SOLUTION) ×1 IMPLANT

## 2022-07-17 NOTE — Interval H&P Note (Signed)
History and Physical Interval Note:  07/17/2022 8:47 AM  Manuel Atkins  has presented today for surgery, with the diagnosis of bladder calculi.  The various methods of treatment have been discussed with the patient and family. After consideration of risks, benefits and other options for treatment, the patient has consented to  Procedure(s) with comments: CYSTOSCOPY WITH LITHOLAPAXY (N/A) - pt knows to arrive at 7:45 as a surgical intervention.  The patient's history has been reviewed, patient examined, no change in status, stable for surgery.  I have reviewed the patient's chart and labs.  Questions were answered to the patient's satisfaction.     Nicolette Bang

## 2022-07-17 NOTE — Anesthesia Preprocedure Evaluation (Addendum)
Anesthesia Evaluation  Patient identified by MRN, date of birth, ID band Patient awake    Reviewed: Allergy & Precautions, NPO status , Patient's Chart, lab work & pertinent test results, reviewed documented beta blocker date and time   Airway Mallampati: II  TM Distance: >3 FB Neck ROM: Full    Dental  (+) Dental Advisory Given, Teeth Intact   Pulmonary shortness of breath and with exertion, sleep apnea and Continuous Positive Airway Pressure Ventilation , pneumonia, former smoker,    Pulmonary exam normal breath sounds clear to auscultation       Cardiovascular Exercise Tolerance: Good hypertension, Pt. on home beta blockers and Pt. on medications Normal cardiovascular exam+ dysrhythmias + pacemaker  Rhythm:Regular Rate:Normal  1. Left ventricular ejection fraction, by estimation, is 70 to 75%. The left ventricle has hyperdynamic function. The left ventricle has no  regional wall motion abnormalities. Left ventricular diastolic parameters  were normal.  2. Right ventricular systolic function is normal. The right ventricular  size is normal. There is mildly elevated pulmonary artery systolic  pressure.  3. The mitral valve is grossly normal. No evidence of mitral valve  regurgitation. No evidence of mitral stenosis. Moderate mitral annular  calcification.  4. The aortic valve has an indeterminant number of cusps. There is mild  calcification of the aortic valve. There is mild thickening of the aortic  valve. Aortic valve regurgitation is mild. Mild aortic valve stenosis.  5. The inferior vena cava is normal in size with <50% respiratory  variability, suggesting right atrial pressure of 8 mmHg.    Neuro/Psych  Headaches, negative psych ROS   GI/Hepatic Neg liver ROS, PUD, GERD  Medicated,  Endo/Other  diabetes, Well Controlled, Type 2, Oral Hypoglycemic Agents  Renal/GU negative Renal ROS  negative genitourinary    Musculoskeletal negative musculoskeletal ROS (+)   Abdominal   Peds negative pediatric ROS (+)  Hematology  (+) Blood dyscrasia, anemia ,   Anesthesia Other Findings   Reproductive/Obstetrics negative OB ROS                           Anesthesia Physical Anesthesia Plan  ASA: 3  Anesthesia Plan: General   Post-op Pain Management: Dilaudid IV   Induction: Intravenous  PONV Risk Score and Plan: 4 or greater and Ondansetron  Airway Management Planned: LMA  Additional Equipment:   Intra-op Plan:   Post-operative Plan: Extubation in OR  Informed Consent: I have reviewed the patients History and Physical, chart, labs and discussed the procedure including the risks, benefits and alternatives for the proposed anesthesia with the patient or authorized representative who has indicated his/her understanding and acceptance.     Dental advisory given  Plan Discussed with: CRNA and Surgeon  Anesthesia Plan Comments:       Anesthesia Quick Evaluation

## 2022-07-17 NOTE — Anesthesia Procedure Notes (Signed)
Procedure Name: LMA Insertion Date/Time: 07/17/2022 10:14 AM  Performed by: Jonna Munro, CRNAPre-anesthesia Checklist: Patient identified, Emergency Drugs available, Suction available, Patient being monitored and Timeout performed Patient Re-evaluated:Patient Re-evaluated prior to induction Oxygen Delivery Method: Circle system utilized Preoxygenation: Pre-oxygenation with 100% oxygen Induction Type: IV induction LMA: LMA inserted LMA Size: 4.0 Number of attempts: 1 Placement Confirmation: positive ETCO2, CO2 detector and breath sounds checked- equal and bilateral Tube secured with: Tape Dental Injury: Teeth and Oropharynx as per pre-operative assessment

## 2022-07-17 NOTE — Anesthesia Postprocedure Evaluation (Signed)
Anesthesia Post Note  Patient: Manuel Atkins  Procedure(s) Performed: CYSTOSCOPY WITH LITHOLAPAXY (Bladder) HOLMIUM LASER APPLICATION (Bladder)  Patient location during evaluation: Phase II Anesthesia Type: General Level of consciousness: awake and alert and oriented Pain management: pain level controlled Vital Signs Assessment: post-procedure vital signs reviewed and stable Respiratory status: spontaneous breathing, nonlabored ventilation and respiratory function stable Cardiovascular status: blood pressure returned to baseline and stable Postop Assessment: no apparent nausea or vomiting Anesthetic complications: no   No notable events documented.   Last Vitals:  Vitals:   07/17/22 1101 07/17/22 1115  BP:  129/73  Pulse: 70 62  Resp: 16 20  Temp:  36.7 C  SpO2: 95% 94%    Last Pain:  Vitals:   07/17/22 1101  TempSrc:   PainSc: 0-No pain                 Ronya Gilcrest C Davi Kroon

## 2022-07-17 NOTE — OR Nursing (Signed)
Wife has bilateral hearing aids and glasses.

## 2022-07-17 NOTE — Transfer of Care (Signed)
Immediate Anesthesia Transfer of Care Note  Patient: Manuel Atkins  Procedure(s) Performed: CYSTOSCOPY WITH LITHOLAPAXY  Patient Location: PACU  Anesthesia Type:General  Level of Consciousness: awake, drowsy and patient cooperative  Airway & Oxygen Therapy: Patient Spontanous Breathing and Patient connected to face mask oxygen  Post-op Assessment: Report given to RN, Post -op Vital signs reviewed and stable and Patient moving all extremities X 4  Post vital signs: Reviewed and stable  Last Vitals:  Vitals Value Taken Time  BP    Temp    Pulse    Resp    SpO2      Last Pain:  Vitals:   07/17/22 0809  TempSrc: Oral      Patients Stated Pain Goal: 9 (13/68/59 9234)  Complications: No notable events documented.

## 2022-07-17 NOTE — Op Note (Signed)
Preoperative diagnosis: BPH, bladder calculus  Postoperative diagnosis: same  Procedure: 1 cystoscopy 2. cystolithalopaxy for a stone less than 2.5cm  Attending: Nicolette Bang  Anesthesia: General  Estimated blood loss: Minimal  Drains: none  Specimens: 1. Bladder calculus  Antibiotics: ancef  Findings:  Ureteral orifices in normal anatomic location.  1.5cm bladder calculus  Indications: Patient is a 88 year old male with a history of BPH and bladder calculus.  After discussing treatment options, they decided proceed with cystolithalopaxy.  Procedure in detail: The patient was brought to the operating room and a brief timeout was done to ensure correct patient, correct procedure, correct site.  General anesthesia was administered patient was placed in dorsal lithotomy position.  Their genitalia was then prepped and draped in usual sterile fashion.  A rigid 35 French cystoscope was passed in the urethra and the bladder.  Bladder was inspected and we noted no masses or lesions.  the ureteral orifices were in the normal orthotopic locations. Using the 1000nm laser fiber the bladder calculus was fragmented and the fragments were then irrigated from the bladder. The stone fragments were the sent for analysis. The bladder was then drained and this concluded the procedure which was well tolerated by patient.  Complications: None  Condition: Stable, extubated, transferred to PACU  Plan: Patient is to be discharge home. He will followup in 2 weeks for stone analysis discussion

## 2022-07-18 ENCOUNTER — Emergency Department (HOSPITAL_COMMUNITY)
Admission: EM | Admit: 2022-07-18 | Discharge: 2022-07-18 | Disposition: A | Discharge status: Home / Self Care | Payer: Medicare Other | Attending: Emergency Medicine | Admitting: Emergency Medicine

## 2022-07-18 ENCOUNTER — Encounter (HOSPITAL_COMMUNITY): Payer: Self-pay | Admitting: Emergency Medicine

## 2022-07-18 ENCOUNTER — Ambulatory Visit: Payer: Medicare Other | Admitting: Urology

## 2022-07-18 VITALS — BP 127/72 | HR 83

## 2022-07-18 DIAGNOSIS — R109 Unspecified abdominal pain: Secondary | ICD-10-CM | POA: Diagnosis not present

## 2022-07-18 DIAGNOSIS — R339 Retention of urine, unspecified: Secondary | ICD-10-CM | POA: Diagnosis not present

## 2022-07-18 DIAGNOSIS — R310 Gross hematuria: Secondary | ICD-10-CM | POA: Diagnosis not present

## 2022-07-18 DIAGNOSIS — Z743 Need for continuous supervision: Secondary | ICD-10-CM | POA: Diagnosis not present

## 2022-07-18 DIAGNOSIS — R6889 Other general symptoms and signs: Secondary | ICD-10-CM | POA: Diagnosis not present

## 2022-07-18 DIAGNOSIS — Z7901 Long term (current) use of anticoagulants: Secondary | ICD-10-CM | POA: Diagnosis not present

## 2022-07-18 DIAGNOSIS — R102 Pelvic and perineal pain: Secondary | ICD-10-CM | POA: Diagnosis not present

## 2022-07-18 MED ORDER — FINASTERIDE 5 MG PO TABS: 5.0000 mg | ORAL_TABLET | Freq: Every day | ORAL | 3 refills | Status: DC

## 2022-07-18 MED ORDER — HYDROCODONE-ACETAMINOPHEN 5-325 MG PO TABS: 1.0000 | ORAL_TABLET | Freq: Four times a day (QID) | ORAL | 0 refills | Status: DC | PRN

## 2022-07-18 NOTE — Discharge Instructions (Addendum)
Schedule follow-up with your urologist.  Hold your morning dose of Eliquis.  Return to the ER if you continue to have bleeding and are getting short of breath, heart palpitations, chest pain or if you develop a fever.  If [facility] is closed today, call the [facility] at [phone]

## 2022-07-18 NOTE — ED Triage Notes (Signed)
Pt had bladder stone removed this morning at about 10am. Tonight pt hasn't been able to urinate for the past 6 hrs.

## 2022-07-18 NOTE — ED Provider Notes (Signed)
[facility] EMERGENCY DEPARTMENT Provider Note   CSN: [id number] Arrival date & time: 07/18/22  0049     History  Chief Complaint  Patient presents with   Urinary Retention    Manuel Atkins is a 88 y.o. male.  Patient presents to the emergency department with urinary retention.  Patient is on Eliquis because of prior blood clots.  He held his morning Eliquis this morning and then had cystoscopy for laser removal of a bladder stone.  He reports that he was able to urinate when he got home but there was blood in his urine.  He has not passed any urine in the last 6 hours or so and has a lot of pain and pressure in the bladder.       Home Medications Prior to Admission medications   Medication Sig Start Date End Date Taking? Authorizing Provider  acetaminophen (TYLENOL) 500 MG tablet Take 1,000 mg by mouth every 6 (six) hours as needed for moderate pain or headache.    [provider]  ALPRAZolam Duanne Moron) 0.25 MG tablet Take 0.5 tablets (0.125 mg total) by mouth 3 (three) times daily as needed for anxiety. for anxiety 07/06/18   Rogene Houston, MD  amLODipine (NORVASC) 5 MG tablet Take 1 tablet (5 mg total) by mouth daily. 11/02/21 11/02/22  Annita Brod, MD  carvedilol (COREG) 3.125 MG tablet Take 1 tablet (3.125 mg total) by mouth 2 (two) times daily. 11/02/21 07/08/24  Annita Brod, MD  cetirizine (ZYRTEC) 10 MG tablet Take 10 mg by mouth at bedtime.    [provider]  diphenhydrAMINE (BENADRYL) 50 MG capsule Take 50 mg by mouth at bedtime.    [provider]  ELIQUIS 5 MG TABS tablet TAKE ONE TABLET BY MOUTH TWICE DAILY 07/10/22   Mannam, Praveen, MD  glipiZIDE (GLUCOTROL XL) 10 MG 24 hr tablet Take 10 mg by mouth in the morning. 09/09/21   [provider]  imipramine (TOFRANIL) 25 MG tablet Take 75 mg by mouth at bedtime.    [provider]  loratadine (CLARITIN) 10 MG tablet Take 10 mg by mouth in the morning.    [provider]  losartan (COZAAR) 25 MG tablet Take 1 tablet (25 mg total) by mouth daily. 11/02/21 07/08/24  Annita Brod, MD  metFORMIN (GLUCOPHAGE-XR) 500 MG 24 hr tablet Take 1,000 mg by mouth in the morning and at bedtime. 06/09/22   [provider]  Multiple Vitamins-Minerals (MULTIVITAMIN ADULTS 50+) TABS Take 1 tablet by mouth in the morning.    [provider]  pantoprazole (PROTONIX) 40 MG tablet TAKE ONE TABLET BY MOUTH EVERY OTHER DAY 03/06/22   Rogene Houston, MD  Probiotic Product (PROBIOTIC DAILY PO) Take 1 capsule by mouth in the morning.    [provider]  ROCKLATAN 0.02-0.005 % SOLN Place 1 drop into both eyes at bedtime. 10/08/21   [provider]  rosuvastatin (CRESTOR) 10 MG tablet Take 10 mg by mouth every evening.  11/17/18   [provider]  tamsulosin (FLOMAX) 0.4 MG CAPS capsule Take 0.4 mg by mouth at bedtime. 07/17/20   [provider]  traMADol (ULTRAM) 50 MG tablet Take 1 tablet (50 mg total) by mouth every 6 (six) hours as needed. 07/17/22 07/17/23  McKenzie, Candee Furbish, MD      Allergies    Purixan [mercaptopurine]    Review of Systems   Review of Systems  Physical Exam Updated Vital Signs  BP 127/66   Pulse 62   Resp 18   Ht 5' 11"  (1.803 m)   Wt 92.5 kg   SpO2 94%   BMI 28.44 kg/m  Physical Exam Vitals and nursing note reviewed.  Constitutional:      General: He is not in acute distress.    Appearance: He is well-developed.  HENT:     Head: Normocephalic and atraumatic.     Mouth/Throat:     Mouth: Mucous membranes are moist.  Eyes:     General: Vision grossly intact. Gaze aligned appropriately.     Extraocular Movements: Extraocular movements intact.     Conjunctiva/sclera: Conjunctivae normal.  Cardiovascular:     Rate and Rhythm: Normal rate and regular rhythm.     Pulses: Normal pulses.     Heart sounds: Normal heart sounds, S1 normal and S2 normal. No murmur heard.    No friction rub.  No gallop.  Pulmonary:     Effort: Pulmonary effort is normal. No respiratory distress.     Breath sounds: Normal breath sounds.  Abdominal:     Palpations: Abdomen is soft.     Tenderness: There is abdominal tenderness in the suprapubic area. There is no guarding or rebound.     Hernia: No hernia is present.  Musculoskeletal:        General: No swelling.     Cervical back: Full passive range of motion without pain, normal range of motion and neck supple. No pain with movement, spinous process tenderness or muscular tenderness. Normal range of motion.     Right lower leg: No edema.     Left lower leg: No edema.  Skin:    General: Skin is warm and dry.     Capillary Refill: Capillary refill takes less than 2 seconds.     Findings: No ecchymosis, erythema, lesion or wound.  Neurological:     Mental Status: He is alert and oriented to person, place, and time.     GCS: GCS eye subscore is 4. GCS verbal subscore is 5. GCS motor subscore is 6.     Cranial Nerves: Cranial nerves 2-12 are intact.     Sensory: Sensation is intact.     Motor: Motor function is intact. No weakness or abnormal muscle tone.     Coordination: Coordination is intact.  Psychiatric:        Mood and Affect: Mood normal.        Speech: Speech normal.        Behavior: Behavior normal.     ED Results / Procedures / Treatments   Labs (all labs ordered are listed, but only abnormal results are displayed) Labs Reviewed - No data to display  EKG None  Radiology No results found.  Procedures Procedures    Medications Ordered in ED Medications - No data to display  ED Course/ Medical Decision Making/ A&P                           Medical Decision Making  Patient presents to the emergency department for acute urinary retention.  Patient underwent cystoscopy earlier today to remove a bladder stone.  Note was reviewed.  Bladder stone was broken up with laser and entirely removed with irrigation.  Obstruction  is therefore not related to his stone.  Patient has been passing blood related to his Eliquis use.  A Foley catheter was placed and it promptly clotted off.  Bladder was irrigated  and he then was able to pass 1 L of urine with resolution of his symptoms.  He was hypertensive at arrival with discomfort.  This resolved after the Foley catheter was placed.  All of his vital signs are unremarkable.  Patient to be discharged, follow-up with urology.  Discussed signs and symptoms of anemia, given return precautions.        Final Clinical Impression(s) / ED Diagnoses Final diagnoses:  Gross hematuria    Rx / DC Orders ED Discharge Orders     None         Nesta Scaturro, Gwenyth Allegra, MD 07/18/22 0320
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
## 2022-07-18 NOTE — Progress Notes (Signed)
Cath Change/ Replacement  Patient is present today for a catheter change due to urinary retention.  An 76 f coude noted to be in the urethra but not in the bladder. Catheter slid out of patient upon standing. An 70 f coude noted with a partially inflated balloon.  Foley cath was removed without difficulty.  Patient was cleaned and prepped in a sterile fashion with betadine.  A 22 FR foley cath was replaced into the bladder, no complications were noted. Urine return was noted 524m and urine was red in color.  Bladder was irrigated by doctor.The balloon was filled with 119mof sterile water. A leg bag was attached for drainage.  A night bag was also given to the patient and patient was given instruction on how to change from one bag to another. Patient was given proper instruction on catheter care.    Performed by: Kida Digiulio LPN  Follow up: Per MD note

## 2022-07-21 ENCOUNTER — Ambulatory Visit: Payer: Medicare Other | Admitting: Physician Assistant

## 2022-07-21 ENCOUNTER — Telehealth: Payer: Self-pay

## 2022-07-21 DIAGNOSIS — N210 Calculus in bladder: Secondary | ICD-10-CM

## 2022-07-21 DIAGNOSIS — R339 Retention of urine, unspecified: Secondary | ICD-10-CM | POA: Diagnosis not present

## 2022-07-21 DIAGNOSIS — R310 Gross hematuria: Secondary | ICD-10-CM | POA: Diagnosis not present

## 2022-07-21 DIAGNOSIS — N481 Balanitis: Secondary | ICD-10-CM

## 2022-07-21 DIAGNOSIS — Z87442 Personal history of urinary calculi: Secondary | ICD-10-CM

## 2022-07-21 MED ORDER — CLOTRIMAZOLE 1 % EX CREA
1.0000 | TOPICAL_CREAM | Freq: Two times a day (BID) | CUTANEOUS | 0 refills | Status: DC
Start: 2022-07-21 — End: 2024-04-10

## 2022-07-21 NOTE — Telephone Encounter (Signed)
Call patient to follow up if the patient was able to void after doing the voiding trial this morning. Patient voiced that he was able to void a few times in the past few hours. Patient voiced understanding.

## 2022-07-21 NOTE — Progress Notes (Signed)
Fill and Pull Catheter Removal  Patient is present today for a catheter removal.  Patient was cleaned and prepped in a sterile fashion 50 ml of sterile water/ saline was instilled into the bladder when the patient felt the urge to urinate. 10 ml of water was then drained from the balloon.  A 22 FR foley cath was removed from the bladder no complications were noted .  Patient as then given some time to void on their own.  Patient cannot void  0 ml on their own after some time.  Patient tolerated well.Patient will return today around 2:30pm today for PVR.  Performed by: Marisue Brooklyn, CMA  Follow up/ Additional notes: Follow up as scheduled
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
## 2022-07-21 NOTE — Patient Instructions (Signed)
Senokot-S while taking hydrocodone Miralax

## 2022-07-22 ENCOUNTER — Encounter (HOSPITAL_COMMUNITY): Payer: Self-pay | Admitting: Urology

## 2022-07-22 ENCOUNTER — Telehealth (INDEPENDENT_AMBULATORY_CARE_PROVIDER_SITE_OTHER): Payer: Self-pay

## 2022-07-22 ENCOUNTER — Other Ambulatory Visit (INDEPENDENT_AMBULATORY_CARE_PROVIDER_SITE_OTHER): Payer: Self-pay | Admitting: Internal Medicine

## 2022-07-23 ENCOUNTER — Ambulatory Visit: Payer: Medicare Other | Admitting: Physician Assistant

## 2022-07-24 ENCOUNTER — Telehealth: Payer: Self-pay

## 2022-07-24 LAB — CALCULI, WITH PHOTOGRAPH (CLINICAL LAB)
Uric Acid Calculi: 100 %
Weight Calculi: 745 mg

## 2022-07-24 NOTE — Telephone Encounter (Signed)
Pt is 1 week post cystolitholapaxy. Spoke with pt's wife about pt concerns of hematuria noted x 3 late yesterday and last night. Urine is now clear and no sxs of UTI. Pt has resumed Eliquis for past 4 days. No fever, chills, dysuria, frequency. Pt denies pain. Recent HGB noted to be stable and pt having no sxs of ShoB, dizziness. If concerns of anemia occur, pt will have labs with PCP. Hold Eliquis X 2 days and increase fluid intake. Offered OV for UA and pt will call if he elects to do this. Currently asymptomatic with clear urine.

## 2022-07-24 NOTE — Telephone Encounter (Signed)
Patient called with concern about gross hematuria after bladder stone surgery he had last week with Dr. Alyson Ingles. Patient voiced that he seen blood in his urine twice last night and the third time he went to void he saw a blood clot when he voided. Patient voiced he would like to speak with Sharee Pimple about his concern. Made patient aware that I would send his concern to [facility] and she would follow up with the patient. Patient voiced understanding.

## 2022-07-29 ENCOUNTER — Encounter: Payer: Self-pay | Admitting: Urology

## 2022-07-29 NOTE — Patient Instructions (Signed)

## 2022-07-30 DIAGNOSIS — L110 Acquired keratosis follicularis: Secondary | ICD-10-CM | POA: Diagnosis not present

## 2022-07-30 DIAGNOSIS — E1140 Type 2 diabetes mellitus with diabetic neuropathy, unspecified: Secondary | ICD-10-CM | POA: Diagnosis not present

## 2022-07-30 DIAGNOSIS — B351 Tinea unguium: Secondary | ICD-10-CM | POA: Diagnosis not present

## 2022-08-01 ENCOUNTER — Ambulatory Visit (INDEPENDENT_AMBULATORY_CARE_PROVIDER_SITE_OTHER): Payer: Medicare Other

## 2022-08-01 DIAGNOSIS — D649 Anemia, unspecified: Secondary | ICD-10-CM | POA: Diagnosis not present

## 2022-08-01 DIAGNOSIS — I4430 Unspecified atrioventricular block: Secondary | ICD-10-CM | POA: Diagnosis not present

## 2022-08-01 DIAGNOSIS — D529 Folate deficiency anemia, unspecified: Secondary | ICD-10-CM | POA: Diagnosis not present

## 2022-08-01 DIAGNOSIS — N183 Chronic kidney disease, stage 3 unspecified: Secondary | ICD-10-CM | POA: Diagnosis not present

## 2022-08-01 DIAGNOSIS — D519 Vitamin B12 deficiency anemia, unspecified: Secondary | ICD-10-CM | POA: Diagnosis not present

## 2022-08-01 DIAGNOSIS — I10 Essential (primary) hypertension: Secondary | ICD-10-CM | POA: Diagnosis not present

## 2022-08-01 DIAGNOSIS — E7849 Other hyperlipidemia: Secondary | ICD-10-CM | POA: Diagnosis not present

## 2022-08-01 DIAGNOSIS — E782 Mixed hyperlipidemia: Secondary | ICD-10-CM | POA: Diagnosis not present

## 2022-08-01 DIAGNOSIS — E1122 Type 2 diabetes mellitus with diabetic chronic kidney disease: Secondary | ICD-10-CM | POA: Diagnosis not present

## 2022-08-04 LAB — CUP PACEART REMOTE DEVICE CHECK
Date Time Interrogation Session: 20230922100518
Implantable Lead Implant Date: 20221222
Implantable Lead Implant Date: 20221222
Implantable Lead Location: 753858
Implantable Lead Location: 753859
Implantable Lead Model: 377171
Implantable Lead Model: 377171
Implantable Lead Serial Number: 8000640340
Implantable Lead Serial Number: 8000648576
Implantable Pulse Generator Implant Date: 20221222
Pulse Gen Model: 407145
Pulse Gen Serial Number: 70300615

## 2022-08-05 DIAGNOSIS — I10 Essential (primary) hypertension: Secondary | ICD-10-CM | POA: Diagnosis not present

## 2022-08-05 DIAGNOSIS — Z0001 Encounter for general adult medical examination with abnormal findings: Secondary | ICD-10-CM | POA: Diagnosis not present

## 2022-08-05 DIAGNOSIS — G43909 Migraine, unspecified, not intractable, without status migrainosus: Secondary | ICD-10-CM | POA: Diagnosis not present

## 2022-08-05 DIAGNOSIS — M549 Dorsalgia, unspecified: Secondary | ICD-10-CM | POA: Diagnosis not present

## 2022-08-05 DIAGNOSIS — K7581 Nonalcoholic steatohepatitis (NASH): Secondary | ICD-10-CM | POA: Diagnosis not present

## 2022-08-05 DIAGNOSIS — D692 Other nonthrombocytopenic purpura: Secondary | ICD-10-CM | POA: Diagnosis not present

## 2022-08-05 DIAGNOSIS — D696 Thrombocytopenia, unspecified: Secondary | ICD-10-CM | POA: Diagnosis not present

## 2022-08-05 DIAGNOSIS — K5100 Ulcerative (chronic) pancolitis without complications: Secondary | ICD-10-CM | POA: Diagnosis not present

## 2022-08-05 DIAGNOSIS — Z23 Encounter for immunization: Secondary | ICD-10-CM | POA: Diagnosis not present

## 2022-08-05 DIAGNOSIS — E1122 Type 2 diabetes mellitus with diabetic chronic kidney disease: Secondary | ICD-10-CM | POA: Diagnosis not present

## 2022-08-05 DIAGNOSIS — I2699 Other pulmonary embolism without acute cor pulmonale: Secondary | ICD-10-CM | POA: Diagnosis not present

## 2022-08-05 DIAGNOSIS — E7849 Other hyperlipidemia: Secondary | ICD-10-CM | POA: Diagnosis not present

## 2022-08-08 NOTE — Progress Notes (Signed)
Remote pacemaker transmission.   

## 2022-08-18 DIAGNOSIS — Z23 Encounter for immunization: Secondary | ICD-10-CM | POA: Diagnosis not present

## 2022-08-22 ENCOUNTER — Ambulatory Visit (INDEPENDENT_AMBULATORY_CARE_PROVIDER_SITE_OTHER): Payer: Medicare Other | Admitting: Urology

## 2022-08-22 VITALS — BP 132/78 | HR 81

## 2022-08-22 DIAGNOSIS — N401 Enlarged prostate with lower urinary tract symptoms: Secondary | ICD-10-CM

## 2022-08-22 DIAGNOSIS — N138 Other obstructive and reflux uropathy: Secondary | ICD-10-CM | POA: Diagnosis not present

## 2022-08-22 DIAGNOSIS — N3000 Acute cystitis without hematuria: Secondary | ICD-10-CM

## 2022-08-22 DIAGNOSIS — R339 Retention of urine, unspecified: Secondary | ICD-10-CM | POA: Diagnosis not present

## 2022-08-22 LAB — BLADDER SCAN AMB NON-IMAGING: Scan Result: 0

## 2022-08-22 LAB — POCT URINALYSIS DIPSTICK
Glucose, UA: POSITIVE — AB
Ketones, UA: NEGATIVE
Nitrite, UA: NEGATIVE
Protein, UA: POSITIVE — AB
Spec Grav, UA: >=1.03 — AB (ref 1.010–1.025)
Urobilinogen, UA: 1 E.U./dL
pH, UA: 5 (ref 5.0–8.0)

## 2022-08-22 MED ORDER — TAMSULOSIN HCL 0.4 MG PO CAPS
0.4000 mg | ORAL_CAPSULE | Freq: Every day | ORAL | 11 refills | Status: DC
Start: 2022-08-22 — End: 2023-02-20

## 2022-08-22 MED ORDER — FINASTERIDE 5 MG PO TABS: 5.0000 mg | ORAL_TABLET | Freq: Every day | ORAL | 3 refills | Status: DC

## 2022-08-22 NOTE — Progress Notes (Unsigned)
post void residual=0 ?
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
## 2022-08-26 ENCOUNTER — Telehealth: Payer: Self-pay

## 2022-08-26 DIAGNOSIS — R339 Retention of urine, unspecified: Secondary | ICD-10-CM

## 2022-08-26 LAB — URINE CULTURE

## 2022-08-26 MED ORDER — NITROFURANTOIN MONOHYD MACRO 100 MG PO CAPS: 100.0000 mg | ORAL_CAPSULE | Freq: Two times a day (BID) | ORAL | 0 refills | Status: DC

## 2022-08-26 NOTE — Telephone Encounter (Signed)
Patient's wife called and made aware of pt's positive urine culture and antibiotic sent to pharmacy per J.Summerlin PA

## 2022-08-28 ENCOUNTER — Encounter: Payer: Self-pay | Admitting: Urology

## 2022-08-28 NOTE — Patient Instructions (Signed)

## 2022-09-09 DIAGNOSIS — E1165 Type 2 diabetes mellitus with hyperglycemia: Secondary | ICD-10-CM | POA: Diagnosis not present

## 2022-09-09 DIAGNOSIS — K219 Gastro-esophageal reflux disease without esophagitis: Secondary | ICD-10-CM | POA: Diagnosis not present

## 2022-09-09 DIAGNOSIS — I10 Essential (primary) hypertension: Secondary | ICD-10-CM | POA: Diagnosis not present

## 2022-09-09 DIAGNOSIS — E782 Mixed hyperlipidemia: Secondary | ICD-10-CM | POA: Diagnosis not present

## 2022-09-10 ENCOUNTER — Ambulatory Visit: Payer: Medicare Other | Admitting: Physician Assistant

## 2022-09-10 ENCOUNTER — Telehealth: Payer: Self-pay

## 2022-09-10 VITALS — BP 118/69 | HR 90 | Wt 205.0 lb

## 2022-09-10 DIAGNOSIS — N401 Enlarged prostate with lower urinary tract symptoms: Secondary | ICD-10-CM | POA: Diagnosis not present

## 2022-09-10 DIAGNOSIS — Z87442 Personal history of urinary calculi: Secondary | ICD-10-CM

## 2022-09-10 DIAGNOSIS — R300 Dysuria: Secondary | ICD-10-CM | POA: Diagnosis not present

## 2022-09-10 DIAGNOSIS — R9389 Abnormal findings on diagnostic imaging of other specified body structures: Secondary | ICD-10-CM

## 2022-09-10 DIAGNOSIS — N210 Calculus in bladder: Secondary | ICD-10-CM

## 2022-09-10 DIAGNOSIS — N138 Other obstructive and reflux uropathy: Secondary | ICD-10-CM | POA: Diagnosis not present

## 2022-09-10 LAB — BLADDER SCAN AMB NON-IMAGING: Scan Result: 4
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
## 2022-09-10 NOTE — Telephone Encounter (Signed)
Patient called and voiced that he is having some burning when voiding. Patient voiced he passed a small stone this morning. Made patient aware that we can bring him in today to see Sharee Pimple the PA to address any concerns or issues. Patient voiced understanding.

## 2022-09-10 NOTE — Progress Notes (Signed)
2:30 PM post void residual =4

## 2022-09-11 LAB — URINALYSIS, ROUTINE W REFLEX MICROSCOPIC
Bilirubin, UA: NEGATIVE
Leukocytes,UA: NEGATIVE
Nitrite, UA: NEGATIVE
Specific Gravity, UA: 1.025 (ref 1.005–1.030)
Urobilinogen, Ur: 1 mg/dL (ref 0.2–1.0)
pH, UA: 5 (ref 5.0–7.5)

## 2022-09-11 LAB — MICROSCOPIC EXAMINATION: Bacteria, UA: NONE SEEN

## 2022-09-16 ENCOUNTER — Telehealth: Payer: Self-pay

## 2022-09-16 NOTE — Telephone Encounter (Signed)
Patient left a voice messaged 09-16-2022.  Wanting the results from culture done most recently.  Call back:  [phone]   Thanks, Helene Kelp

## 2022-09-17 LAB — CALCULI, WITH PHOTOGRAPH (CLINICAL LAB)
Uric Acid Calculi: 100 %
Weight Calculi: 20 mg

## 2022-09-17 NOTE — Telephone Encounter (Signed)
Discussed UA results with Sharee Pimple he reviewed UA and urine looks good. Wife notified. Voiced understanding.

## 2022-09-26 ENCOUNTER — Other Ambulatory Visit: Payer: Self-pay | Admitting: Physician Assistant

## 2022-09-26 ENCOUNTER — Telehealth: Payer: Self-pay

## 2022-09-26 MED ORDER — SODIUM BICARBONATE 650 MG PO TABS: 650.0000 mg | ORAL_TABLET | Freq: Two times a day (BID) | ORAL | 1 refills | Status: DC

## 2022-09-26 NOTE — Telephone Encounter (Signed)
Made patient aware that his stone analysis resulted as a uric acid stone and  In order to prevent further formation of new stones, we need to increase the pH of his urinary system.  To do this, he will need to start a prescription of sodium bicarb twice daily which I have sent to his pharmacy. Patient voiced understanding

## 2022-09-26 NOTE — Progress Notes (Signed)
Recent stone analysis indicates uric acid stones.  Sodium bicarb sent to the patient's pharmacy.

## 2022-09-26 NOTE — Telephone Encounter (Signed)
-----   Message from Reynaldo Minium, [place] sent at 09/26/2022 10:06 AM EST ----- Please let the patient know that his stone analysis resulted as a uric acid stone.  In order to prevent further formation of new stones, we need to increase the pH of his urinary system.  To do this, he will need to start a prescription of sodium bicarb twice daily which I have sent to his pharmacy. ----- Message ----- From: Interface, [facility] Lab Results In Sent: 09/11/2022   5:37 AM EST To: Berneice Heinrich [place], PA-C

## 2022-10-06 DIAGNOSIS — H401211 Low-tension glaucoma, right eye, mild stage: Secondary | ICD-10-CM | POA: Diagnosis not present

## 2022-10-08 DIAGNOSIS — E1140 Type 2 diabetes mellitus with diabetic neuropathy, unspecified: Secondary | ICD-10-CM | POA: Diagnosis not present

## 2022-10-08 DIAGNOSIS — L110 Acquired keratosis follicularis: Secondary | ICD-10-CM | POA: Diagnosis not present

## 2022-10-08 DIAGNOSIS — B351 Tinea unguium: Secondary | ICD-10-CM | POA: Diagnosis not present

## 2022-10-13 ENCOUNTER — Telehealth: Payer: Self-pay

## 2022-10-13 NOTE — Telephone Encounter (Signed)
Patient called in today unsure of why he was prescribe sodium bicarbonate. Made patient aware that he stone analysis resulted that he uric acid stone and to prevent further formation of new stone he will need to take sodium bicarbonate twice daily to prevent formation of stones. Patient voiced that he can not take the medication due it being to big and not coated. Made patient aware that I will send his request to the MD and someone will be in touch with him. Patient voiced understanding

## 2022-10-14 ENCOUNTER — Other Ambulatory Visit: Payer: Medicare Other

## 2022-10-14 DIAGNOSIS — R300 Dysuria: Secondary | ICD-10-CM | POA: Diagnosis not present

## 2022-10-14 MED ORDER — SODIUM BICARBONATE 325 MG PO TABS: 325.0000 mg | ORAL_TABLET | Freq: Two times a day (BID) | ORAL | 11 refills | Status: DC

## 2022-10-14 NOTE — Telephone Encounter (Signed)
Made patient aware that a rx for sodium bicarbonate for 325 mg twice daily are smaller pills and should be easier for him to take. Patient voiced understanding

## 2022-10-14 NOTE — Addendum Note (Signed)
Addended by: Darcella Gasman R on: 10/14/2022 11:30 AM   Modules accepted: Orders

## 2022-10-15 LAB — URINALYSIS, ROUTINE W REFLEX MICROSCOPIC
Bilirubin, UA: NEGATIVE
Leukocytes,UA: NEGATIVE
Nitrite, UA: NEGATIVE
RBC, UA: NEGATIVE
Specific Gravity, UA: 1.02 (ref 1.005–1.030)
Urobilinogen, Ur: 1 mg/dL (ref 0.2–1.0)
pH, UA: 5.5 (ref 5.0–7.5)

## 2022-10-15 LAB — MICROSCOPIC EXAMINATION
Bacteria, UA: NONE SEEN
RBC, Urine: NONE SEEN /hpf (ref 0–2)
WBC, UA: NONE SEEN /hpf (ref 0–5)

## 2022-10-23 ENCOUNTER — Encounter: Payer: Self-pay | Admitting: Urology

## 2022-10-23 ENCOUNTER — Ambulatory Visit: Payer: Medicare Other | Admitting: Urology

## 2022-10-23 VITALS — BP 121/77 | HR 84

## 2022-10-23 DIAGNOSIS — N138 Other obstructive and reflux uropathy: Secondary | ICD-10-CM

## 2022-10-23 DIAGNOSIS — R339 Retention of urine, unspecified: Secondary | ICD-10-CM

## 2022-10-23 DIAGNOSIS — R310 Gross hematuria: Secondary | ICD-10-CM | POA: Diagnosis not present

## 2022-10-23 DIAGNOSIS — Z87442 Personal history of urinary calculi: Secondary | ICD-10-CM | POA: Diagnosis not present

## 2022-10-23 DIAGNOSIS — Z87440 Personal history of urinary (tract) infections: Secondary | ICD-10-CM

## 2022-10-23 LAB — MICROSCOPIC EXAMINATION
Bacteria, UA: NONE SEEN
RBC, Urine: >30 /hpf — AB (ref 0–2)

## 2022-10-23 LAB — URINALYSIS, ROUTINE W REFLEX MICROSCOPIC
Bilirubin, UA: NEGATIVE
Nitrite, UA: NEGATIVE
Specific Gravity, UA: 1.02 (ref 1.005–1.030)
Urobilinogen, Ur: 1 mg/dL (ref 0.2–1.0)
pH, UA: 5 (ref 5.0–7.5)

## 2022-10-23 LAB — BLADDER SCAN AMB NON-IMAGING: Scan Result: 0
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
## 2022-10-26 LAB — URINE CULTURE

## 2022-10-27 ENCOUNTER — Other Ambulatory Visit: Payer: Self-pay

## 2022-10-27 ENCOUNTER — Telehealth: Payer: Self-pay

## 2022-10-27 DIAGNOSIS — N210 Calculus in bladder: Secondary | ICD-10-CM

## 2022-10-27 MED ORDER — SODIUM BICARBONATE 325 MG PO TABS: 325.0000 mg | ORAL_TABLET | Freq: Two times a day (BID) | ORAL | 11 refills | Status: DC

## 2022-10-27 NOTE — Telephone Encounter (Signed)
-----   Message from Irine Seal, MD sent at 10/27/2022 12:05 PM EST ----- Culture has Mx species.  No antibiotic needed. ----- Message ----- From: Sherrilyn Rist, CMA Sent: 10/27/2022  11:22 AM EST To: Irine Seal, MD  Please review

## 2022-10-27 NOTE — Telephone Encounter (Signed)
Made patient's wife aware that the patient urine culture shows mixed species and no antibiotic is needed. Wife voiced understanding

## 2022-10-30 ENCOUNTER — Ambulatory Visit (INDEPENDENT_AMBULATORY_CARE_PROVIDER_SITE_OTHER): Payer: Medicare Other | Admitting: Urology

## 2022-10-30 VITALS — BP 130/83 | HR 86

## 2022-10-30 DIAGNOSIS — N210 Calculus in bladder: Secondary | ICD-10-CM

## 2022-10-30 DIAGNOSIS — R310 Gross hematuria: Secondary | ICD-10-CM | POA: Diagnosis not present

## 2022-10-30 DIAGNOSIS — R109 Unspecified abdominal pain: Secondary | ICD-10-CM
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
## 2022-10-31 ENCOUNTER — Ambulatory Visit (INDEPENDENT_AMBULATORY_CARE_PROVIDER_SITE_OTHER): Payer: Medicare Other

## 2022-10-31 ENCOUNTER — Encounter: Payer: Self-pay | Admitting: Urology

## 2022-10-31 DIAGNOSIS — I4430 Unspecified atrioventricular block: Secondary | ICD-10-CM | POA: Diagnosis not present

## 2022-10-31 LAB — CUP PACEART REMOTE DEVICE CHECK
Date Time Interrogation Session: 20231222084744
Implantable Lead Connection Status: 753985
Implantable Lead Connection Status: 753985
Implantable Lead Implant Date: 20221222
Implantable Lead Implant Date: 20221222
Implantable Lead Location: 753858
Implantable Lead Location: 753859
Implantable Lead Model: 377171
Implantable Lead Model: 377171
Implantable Lead Serial Number: 8000640340
Implantable Lead Serial Number: 8000648576
Implantable Pulse Generator Implant Date: 20221222
Pulse Gen Model: 407145
Pulse Gen Serial Number: 70300615

## 2022-10-31 LAB — URINALYSIS, ROUTINE W REFLEX MICROSCOPIC
Bilirubin, UA: NEGATIVE
Nitrite, UA: NEGATIVE
Specific Gravity, UA: 1.015 (ref 1.005–1.030)
Urobilinogen, Ur: 1 mg/dL (ref 0.2–1.0)
pH, UA: 6 (ref 5.0–7.5)

## 2022-10-31 LAB — MICROSCOPIC EXAMINATION

## 2022-11-19 DIAGNOSIS — E1165 Type 2 diabetes mellitus with hyperglycemia: Secondary | ICD-10-CM | POA: Diagnosis not present

## 2022-11-19 DIAGNOSIS — E7849 Other hyperlipidemia: Secondary | ICD-10-CM | POA: Diagnosis not present

## 2022-11-19 DIAGNOSIS — E1122 Type 2 diabetes mellitus with diabetic chronic kidney disease: Secondary | ICD-10-CM | POA: Diagnosis not present

## 2022-11-19 DIAGNOSIS — I10 Essential (primary) hypertension: Secondary | ICD-10-CM | POA: Diagnosis not present

## 2022-11-19 DIAGNOSIS — N183 Chronic kidney disease, stage 3 unspecified: Secondary | ICD-10-CM | POA: Diagnosis not present

## 2022-11-20 NOTE — Progress Notes (Signed)
Remote pacemaker transmission.   

## 2022-11-26 DIAGNOSIS — I2699 Other pulmonary embolism without acute cor pulmonale: Secondary | ICD-10-CM | POA: Diagnosis not present

## 2022-11-26 DIAGNOSIS — D692 Other nonthrombocytopenic purpura: Secondary | ICD-10-CM | POA: Diagnosis not present

## 2022-11-26 DIAGNOSIS — D696 Thrombocytopenia, unspecified: Secondary | ICD-10-CM | POA: Diagnosis not present

## 2022-11-26 DIAGNOSIS — M549 Dorsalgia, unspecified: Secondary | ICD-10-CM | POA: Diagnosis not present

## 2022-11-26 DIAGNOSIS — I10 Essential (primary) hypertension: Secondary | ICD-10-CM | POA: Diagnosis not present

## 2022-11-26 DIAGNOSIS — N1832 Chronic kidney disease, stage 3b: Secondary | ICD-10-CM | POA: Diagnosis not present

## 2022-11-26 DIAGNOSIS — E7849 Other hyperlipidemia: Secondary | ICD-10-CM | POA: Diagnosis not present

## 2022-11-26 DIAGNOSIS — R300 Dysuria: Secondary | ICD-10-CM | POA: Diagnosis not present

## 2022-11-26 DIAGNOSIS — K5100 Ulcerative (chronic) pancolitis without complications: Secondary | ICD-10-CM | POA: Diagnosis not present

## 2022-11-26 DIAGNOSIS — G43909 Migraine, unspecified, not intractable, without status migrainosus: Secondary | ICD-10-CM | POA: Diagnosis not present

## 2022-11-26 DIAGNOSIS — E1122 Type 2 diabetes mellitus with diabetic chronic kidney disease: Secondary | ICD-10-CM | POA: Diagnosis not present

## 2022-11-26 DIAGNOSIS — K7581 Nonalcoholic steatohepatitis (NASH): Secondary | ICD-10-CM | POA: Diagnosis not present

## 2022-12-19 ENCOUNTER — Other Ambulatory Visit: Payer: Self-pay | Admitting: Pulmonary Disease

## 2022-12-24 DIAGNOSIS — E1140 Type 2 diabetes mellitus with diabetic neuropathy, unspecified: Secondary | ICD-10-CM | POA: Diagnosis not present

## 2022-12-24 DIAGNOSIS — B351 Tinea unguium: Secondary | ICD-10-CM | POA: Diagnosis not present

## 2022-12-24 DIAGNOSIS — L110 Acquired keratosis follicularis: Secondary | ICD-10-CM | POA: Diagnosis not present

## 2023-01-02 ENCOUNTER — Ambulatory Visit: Payer: Medicare Other | Admitting: Pulmonary Disease

## 2023-01-02 ENCOUNTER — Encounter: Payer: Self-pay | Admitting: Pulmonary Disease

## 2023-01-02 VITALS — BP 130/64 | HR 80 | Temp 97.9°F | Ht 71.0 in | Wt 209.0 lb

## 2023-01-02 DIAGNOSIS — G4733 Obstructive sleep apnea (adult) (pediatric): Secondary | ICD-10-CM

## 2023-01-02 DIAGNOSIS — J849 Interstitial pulmonary disease, unspecified: Secondary | ICD-10-CM | POA: Diagnosis not present

## 2023-01-02 MED ORDER — APIXABAN 5 MG PO TABS: 5.0000 mg | ORAL_TABLET | Freq: Two times a day (BID) | ORAL | 5 refills | Status: DC

## 2023-01-02 NOTE — Progress Notes (Signed)
MANSON ZAHLER    FA:[id number]    12-24-1932  Primary Care Physician:[facility]  Referring Physician: [facility] [address] [place],  [place] [address]  Chief complaint: Follow up for interstitial lung disease.   HPI: 89 y.o.  with history of OSA, ulcerative colitis, GERD, elevated transaminitis.  Hospitalized in early January 2019 at [facility] for dyspnea on exertion, hypoxia.  He had been evaluated with echo showing normal EF, proBNP was normal, d-dimer was normal.  He was started on Lasix without improvement.  He had a CT scan which showed mild bronchiectasis with basal interstitial lung disease.  He was started on 20 mg of prednisone and referred to pulmonary.    He was seen by pulmonary at [facility] but wants to transition care to [facility]. He follows with Dr. Laural Golden for ulcerative colitis and is on infliximab and mesalamine.  GERD is complicated by esophageal stricture status post dilation 4 years ago.  Symptoms are stable on PPI.  Elevated transaminases are thought to be secondary to fatty liver.  He still has occasional dysphagia and choking on food. Seen by Dr. Laural Golden and underwent dilation of esophageal stricture on 12/15/17.  Heartburn symptoms are controlled on therapy.  He is had a work-up by Dr. Virgina Jock, cardiology with right and left heart cath showing nonobstructive coronary artery disease, no evidence of pulmonary hypertension. Finished pulmonary rehab in 2019  Diagnosed with segmental pulmonary embolism in November 2020 and currently on Eliquis anticoagulation.  He has discussed with his GI doctor and taken off infliximab in January 2021   Pets: Has dogs.  Exposed to farm animals in childhood.  No birds Occupation: Retired Art gallery manager.  Used to work in [employer]. Exposures: May have been exposed to asbestos.  Reports exposure to cotton dust.  He has a hobby of woodworking and is exposed to wood dust.  No mold, hot tubs.  jacuzzi. Smoking history: 10-pack-year smoking history.  Quit in 1980  Travel History: Not significant  Interim history: Is stable with regard to breathing.  He has chronic dyspnea on exertion without change Continues on the BiPAP.  Outpatient Encounter Medications as of 01/02/2023  Medication Sig   acetaminophen (TYLENOL) 500 MG tablet Take 1,000 mg by mouth every 6 (six) hours as needed for moderate pain or headache.   ALPRAZolam (XANAX) 0.25 MG tablet Take 0.5 tablets (0.125 mg total) by mouth 3 (three) times daily as needed for anxiety. for anxiety   apixaban (ELIQUIS) 5 MG TABS tablet TAKE ONE TABLET BY MOUTH TWICE DAILY   carvedilol (COREG) 3.125 MG tablet Take 1 tablet (3.125 mg total) by mouth 2 (two) times daily.   cetirizine (ZYRTEC) 10 MG tablet Take 10 mg by mouth at bedtime.   clotrimazole (CLOTRIMAZOLE ANTI-FUNGAL) 1 % cream Apply 1 Application topically 2 (two) times daily.   diphenhydrAMINE (BENADRYL) 50 MG capsule Take 50 mg by mouth at bedtime.   finasteride (PROSCAR) 5 MG tablet Take 1 tablet (5 mg total) by mouth daily.   glipiZIDE (GLUCOTROL XL) 10 MG 24 hr tablet Take 10 mg by mouth in the morning.   HYDROcodone-acetaminophen (NORCO) 5-325 MG tablet Take 1 tablet by mouth every 6 (six) hours as needed for moderate pain.   imipramine (TOFRANIL) 25 MG tablet Take 75 mg by mouth at bedtime.   loratadine (CLARITIN) 10 MG tablet Take 10 mg by mouth in the morning.   losartan (COZAAR) 25 MG tablet  Take 1 tablet (25 mg total) by mouth daily.   metFORMIN (GLUCOPHAGE-XR) 500 MG 24 hr tablet Take 1,000 mg by mouth in the morning and at bedtime.   Multiple Vitamins-Minerals (MULTIVITAMIN ADULTS 50+) TABS Take 1 tablet by mouth in the morning.   pantoprazole (PROTONIX) 40 MG tablet TAKE ONE TABLET BY MOUTH EVERY OTHER DAY   Probiotic Product (PROBIOTIC DAILY PO) Take 1 capsule by mouth in the morning.   ROCKLATAN 0.02-0.005 % SOLN Place 1 drop into both eyes at bedtime.    rosuvastatin (CRESTOR) 10 MG tablet Take 10 mg by mouth every evening.    sodium bicarbonate 325 MG tablet Take 1 tablet (325 mg total) by mouth 2 (two) times daily.   tamsulosin (FLOMAX) 0.4 MG CAPS capsule Take 1 capsule (0.4 mg total) by mouth at bedtime.   traMADol (ULTRAM) 50 MG tablet Take 1 tablet (50 mg total) by mouth every 6 (six) hours as needed.   amLODipine (NORVASC) 5 MG tablet Take 1 tablet (5 mg total) by mouth daily.   No facility-administered encounter medications on file as of 01/02/2023.   Physical Exam: Blood pressure 130/64, pulse 80, temperature 97.9 F (36.6 C), temperature source Oral, height '5\' 11"'$  (1.803 m), weight 209 lb (94.8 kg), SpO2 96 %. Gen:      No acute distress HEENT:  EOMI, sclera anicteric Neck:     No masses; no thyromegaly Lungs:    Minimal bibasilar crackles CV:         Regular rate and rhythm; no murmurs Abd:      + bowel sounds; soft, non-tender; no palpable masses, no distension Ext:    No edema; adequate peripheral perfusion Skin:      Warm and dry; no rash Neuro: alert and oriented x 3 Psych: normal mood and affect   Data Reviewed: CT scan 07/27/2008-mild basal atelectasis right upper lobe pulmonary embolism CT scan 11/16/17-mild bronchiectasis, basal reticulation right greater than left.  Borderline right hilar lymphadenopathy.  Nodular liver contour possible cirrhosis High-resolution CT 01/26/18- patchy ground glass attenuation, mild bronchiectasis, septal thickening with no basal gradient.  No honeycombing. Indeterminate for UIP.  Hepatic steatosis, aortic atherosclerosis, left main and left anterior coronary artery disease. High-resolution CT 05/04/2018-mild basal predominant lung fibrosis stable from prior exam. High-resolution CT 10/18/2018-stable interstitial lung disease CTA 09/26/2019-focal filling defect within a subsegmental branch of the right lower lobe pulmonary artery.  Equivocal for PE High-res CT 08/21/2020-stable interstitial lung  disease High-res CT 10/17/2021-stable pattern of pulmonary fibrosis. I have reviewed the images personally.  Barium swallow 05/31/13- mild impairment of esophageal motility, stricture at GE junction with obstruction of barium tablet.  Labs Connective tissue serologies 11/23/17-ANA, ACE, CCP, rheumatoid factor all negative Hypersenitivity panel-negative  PFTs  12/23/17 FVC 2.30 (54%), FEV1 1.97 [66%], F/F 86, TLC 64%, DLCO 42%  05/18/2018 FVC 2.32 [55%), FEV1 1.91 [65%], F/F 82, DLCO 43%  10/20/2018 FVC 2.30 [55%], FEV1 1.96 [6 6%], F/F 85, DLCO 46%  10/24/2019 FVC 2.29 [5%], FEV1 2.03 [70%],/89, TLC 3.63 [49%], DLCO 13.57 [54%],  10/01/2020 FVC 2.28 [56%], FEV1 1.99 [69%], TLC 4.75 (64%), DLCO 15.21 [61%]  09/30/2021 FVC 2.09 [54%], FEV1 1.97 [70%], F/F90, TLC 3.86 [52%], DLCO 13.91 [56%] Severe restriction, moderate diffusion defect  6-minute walk 03/03/18- 338 m 6-minute walk 05/20/18- 288 m 6-minute walk 10/27/18- 363 m  Cardiac RHC 04/20/18 RA: 9 mmHg RV: 29/7 mmHg PA: 29/12 mmHg, mean PAP 19 mmHg PCWP: 9 mmHg LVEDP: 14 mmHg  CO: 4.8  L/min CI: 2.3 L/min/m2  Sleep Received copy of sleep study from [facility] pulmonary, 08/01/2011 BiPAP titration.  Titrated to IPAP 17, EPAP 13.  Severe PLM's noted.  Consider treatment for restless leg syndrome.  Overnight oximetry 02/17/2020 overnight oximetry on room air on BiPAP (IPAP 17 cm, EPAP setting 13 cm)-duration of sleep 8 hours and 31 minutes, time spent below 88% 8 seconds.  Did not qualify for nocturnal oxygen use  Compliance report 06/29/2021 On BiPAP 16/12 97% compliant, residual AHI 4.5   Assessment:  Follow-up for bronchiectasis, interstitial lung disease. He could have interstitial lung disease from occupational, recreational exposure to cotton dust, wood dust, possible asbestos.  Other considerations include pneumonitis from ulcerative colitis or from infliximab, meslamine therapy or chronic aspiration from esophageal  stricture.  Connective tissue serologies are negative. There is no clear evidence that he has IPF.   Cardiac work-up as noted above with no evidence of significant coronary artery disease or pulmonary hypertension. Cardio pulmonary exercise test shows dyspnea likely from combination of cardiac, pulmonary factors. For definite diagnosis of ILD and treatment he will need an open lung biopsy. We had an extensive discussion about risks benefits of the biopsy versus empirically treating him with anti-fibrotics Discussed the option of Envisia test through bronchoscope but he would like to defer all biopsies  Overall he is remained stable over the past couple of years and hence we are continuing conservative management Repeat high-res CT at next available and PFTs in 6 months  Pulmonary embolism Findings are equivocal with nonspecific filling defects. He is currently on Eliquis Would continue anticoagulation given his underlying lung issues and significant dyspnea  OSA Continues on BiPAP Overnight oximetry in 2021 reviewed with no need for supplemental oxygen Download today shows good compliance and response  Recent fall Abnormal labs with hyperkalemia, AKI He is getting physical therapy now.  He has follow-up with primary care 1 month away In the meantime I will order a repeat BMP to make sure his elevated potassium and creatinine are improved  Plan/Recommendations: - High-res CT, PFTs - Continue BiPAP  Marshell Garfinkel MD [facility] 01/02/2023, 3:09 PM  CC: [facility]*

## 2023-01-02 NOTE — Patient Instructions (Signed)
Will make sure that he have a renewal of the Eliquis Continue the BiPAP Will order high-res CT at next available at [facility] Schedule PFTs in 6 months Return to clinic in 6 months after PFTs.

## 2023-01-30 ENCOUNTER — Ambulatory Visit: Payer: Medicare Other | Attending: Internal Medicine

## 2023-01-30 DIAGNOSIS — I4430 Unspecified atrioventricular block: Secondary | ICD-10-CM | POA: Diagnosis not present

## 2023-01-30 LAB — CUP PACEART REMOTE DEVICE CHECK
Battery Voltage: 90
Date Time Interrogation Session: 20240322074807
Implantable Lead Connection Status: 753985
Implantable Lead Connection Status: 753985
Implantable Lead Implant Date: 20221222
Implantable Lead Implant Date: 20221222
Implantable Lead Location: 753858
Implantable Lead Location: 753859
Implantable Lead Model: 377171
Implantable Lead Model: 377171
Implantable Lead Serial Number: 8000640340
Implantable Lead Serial Number: 8000648576
Implantable Pulse Generator Implant Date: 20221222
Pulse Gen Model: 407145
Pulse Gen Serial Number: 70300615

## 2023-02-12 DIAGNOSIS — H401211 Low-tension glaucoma, right eye, mild stage: Secondary | ICD-10-CM | POA: Diagnosis not present

## 2023-02-17 ENCOUNTER — Ambulatory Visit (HOSPITAL_COMMUNITY)
Admission: RE | Admit: 2023-02-17 | Discharge: 2023-02-17 | Disposition: A | Discharge status: Home / Self Care | Payer: Medicare Other | Source: Ambulatory Visit | Attending: Pulmonary Disease | Admitting: Pulmonary Disease

## 2023-02-17 DIAGNOSIS — J8410 Pulmonary fibrosis, unspecified: Secondary | ICD-10-CM | POA: Diagnosis not present

## 2023-02-17 DIAGNOSIS — J479 Bronchiectasis, uncomplicated: Secondary | ICD-10-CM | POA: Diagnosis not present

## 2023-02-17 DIAGNOSIS — J849 Interstitial pulmonary disease, unspecified: Secondary | ICD-10-CM

## 2023-02-20 ENCOUNTER — Encounter: Payer: Self-pay | Admitting: Urology

## 2023-02-20 ENCOUNTER — Ambulatory Visit: Payer: Medicare Other | Admitting: Urology

## 2023-02-20 VITALS — BP 113/74 | HR 92 | Ht 71.0 in | Wt 209.0 lb

## 2023-02-20 DIAGNOSIS — N401 Enlarged prostate with lower urinary tract symptoms: Secondary | ICD-10-CM

## 2023-02-20 DIAGNOSIS — N138 Other obstructive and reflux uropathy: Secondary | ICD-10-CM

## 2023-02-20 DIAGNOSIS — N210 Calculus in bladder: Secondary | ICD-10-CM | POA: Diagnosis not present

## 2023-02-20 DIAGNOSIS — R3915 Urgency of urination: Secondary | ICD-10-CM

## 2023-02-20 DIAGNOSIS — N200 Calculus of kidney: Secondary | ICD-10-CM

## 2023-02-20 LAB — BLADDER SCAN AMB NON-IMAGING: Scan Result: 8

## 2023-02-20 LAB — MICROSCOPIC EXAMINATION: Bacteria, UA: NONE SEEN

## 2023-02-20 LAB — URINALYSIS, ROUTINE W REFLEX MICROSCOPIC
Bilirubin, UA: NEGATIVE
Leukocytes,UA: NEGATIVE
Nitrite, UA: NEGATIVE
RBC, UA: NEGATIVE
Specific Gravity, UA: 1.025 (ref 1.005–1.030)
Urobilinogen, Ur: 0.2 mg/dL (ref 0.2–1.0)
pH, UA: 5.5 (ref 5.0–7.5)

## 2023-02-20 MED ORDER — FINASTERIDE 5 MG PO TABS: 5.0000 mg | ORAL_TABLET | Freq: Every day | ORAL | 3 refills | Status: DC

## 2023-02-20 MED ORDER — SODIUM BICARBONATE 325 MG PO TABS: 325.0000 mg | ORAL_TABLET | Freq: Two times a day (BID) | ORAL | 11 refills | Status: DC

## 2023-02-20 MED ORDER — TAMSULOSIN HCL 0.4 MG PO CAPS: 0.4000 mg | ORAL_CAPSULE | Freq: Every day | ORAL | 11 refills | Status: DC

## 2023-02-20 MED ORDER — GEMTESA 75 MG PO TABS: 1.0000 | ORAL_TABLET | Freq: Every day | ORAL | 0 refills | Status: DC

## 2023-02-20 NOTE — Patient Instructions (Signed)

## 2023-02-20 NOTE — Progress Notes (Signed)
post void residual  8 ml
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
## 2023-02-27 DIAGNOSIS — R739 Hyperglycemia, unspecified: Secondary | ICD-10-CM | POA: Diagnosis not present

## 2023-02-27 DIAGNOSIS — N390 Urinary tract infection, site not specified: Secondary | ICD-10-CM | POA: Diagnosis not present

## 2023-03-03 NOTE — Progress Notes (Signed)
Remote pacemaker transmission.   

## 2023-03-04 DIAGNOSIS — B351 Tinea unguium: Secondary | ICD-10-CM | POA: Diagnosis not present

## 2023-03-04 DIAGNOSIS — L110 Acquired keratosis follicularis: Secondary | ICD-10-CM | POA: Diagnosis not present

## 2023-03-04 DIAGNOSIS — E1140 Type 2 diabetes mellitus with diabetic neuropathy, unspecified: Secondary | ICD-10-CM | POA: Diagnosis not present

## 2023-03-06 DIAGNOSIS — H6122 Impacted cerumen, left ear: Secondary | ICD-10-CM | POA: Diagnosis not present

## 2023-03-06 DIAGNOSIS — H6121 Impacted cerumen, right ear: Secondary | ICD-10-CM | POA: Diagnosis not present

## 2023-03-06 DIAGNOSIS — E1165 Type 2 diabetes mellitus with hyperglycemia: Secondary | ICD-10-CM | POA: Diagnosis not present

## 2023-03-06 DIAGNOSIS — H6123 Impacted cerumen, bilateral: Secondary | ICD-10-CM | POA: Diagnosis not present

## 2023-03-24 DIAGNOSIS — Z1329 Encounter for screening for other suspected endocrine disorder: Secondary | ICD-10-CM | POA: Diagnosis not present

## 2023-03-24 DIAGNOSIS — K219 Gastro-esophageal reflux disease without esophagitis: Secondary | ICD-10-CM | POA: Diagnosis not present

## 2023-03-24 DIAGNOSIS — N183 Chronic kidney disease, stage 3 unspecified: Secondary | ICD-10-CM | POA: Diagnosis not present

## 2023-03-24 DIAGNOSIS — E7849 Other hyperlipidemia: Secondary | ICD-10-CM | POA: Diagnosis not present

## 2023-03-24 DIAGNOSIS — E1165 Type 2 diabetes mellitus with hyperglycemia: Secondary | ICD-10-CM | POA: Diagnosis not present

## 2023-03-30 DIAGNOSIS — G43909 Migraine, unspecified, not intractable, without status migrainosus: Secondary | ICD-10-CM | POA: Diagnosis not present

## 2023-03-30 DIAGNOSIS — I2699 Other pulmonary embolism without acute cor pulmonale: Secondary | ICD-10-CM | POA: Diagnosis not present

## 2023-03-30 DIAGNOSIS — R300 Dysuria: Secondary | ICD-10-CM | POA: Diagnosis not present

## 2023-03-30 DIAGNOSIS — K7581 Nonalcoholic steatohepatitis (NASH): Secondary | ICD-10-CM | POA: Diagnosis not present

## 2023-03-30 DIAGNOSIS — I10 Essential (primary) hypertension: Secondary | ICD-10-CM | POA: Diagnosis not present

## 2023-03-30 DIAGNOSIS — K5100 Ulcerative (chronic) pancolitis without complications: Secondary | ICD-10-CM | POA: Diagnosis not present

## 2023-03-30 DIAGNOSIS — D692 Other nonthrombocytopenic purpura: Secondary | ICD-10-CM | POA: Diagnosis not present

## 2023-03-30 DIAGNOSIS — N1832 Chronic kidney disease, stage 3b: Secondary | ICD-10-CM | POA: Diagnosis not present

## 2023-03-30 DIAGNOSIS — E7849 Other hyperlipidemia: Secondary | ICD-10-CM | POA: Diagnosis not present

## 2023-03-30 DIAGNOSIS — E1122 Type 2 diabetes mellitus with diabetic chronic kidney disease: Secondary | ICD-10-CM | POA: Diagnosis not present

## 2023-03-30 DIAGNOSIS — D696 Thrombocytopenia, unspecified: Secondary | ICD-10-CM | POA: Diagnosis not present

## 2023-04-06 ENCOUNTER — Other Ambulatory Visit: Payer: Self-pay | Admitting: Urology

## 2023-04-09 ENCOUNTER — Ambulatory Visit: Payer: Medicare Other

## 2023-04-09 ENCOUNTER — Telehealth: Payer: Self-pay

## 2023-04-09 DIAGNOSIS — R310 Gross hematuria: Secondary | ICD-10-CM

## 2023-04-09 DIAGNOSIS — N210 Calculus in bladder: Secondary | ICD-10-CM

## 2023-04-09 NOTE — Telephone Encounter (Signed)
Patient called advising he had blood in his urine and would like to drop off a urine specimen. He wanted to ensure he did not have an infection.     Thank you

## 2023-04-09 NOTE — Progress Notes (Signed)
Patient presents today with complaints of  hematuria.  UA and Culture done today.  Dr. Annabell Howells reviewed results and no treatment started. Patient aware of MD recommendations and that we will reach out with culture results.      [email or web address], CMA

## 2023-04-09 NOTE — Telephone Encounter (Addendum)
Patient is made aware urinalysis is clear, urine culture pending. Repeat CT scan for stone study for bladder stones and follow up with Maralyn Sago, NP. Per Dr. Annabell Howells. Per Dr. Annabell Howells patient should hold Eliqus for 2-3 days to see if this has any improvement with the gross hematuria. Patient is aware that Ct stone have to get authorization prior to scheduling and Carlena Sax will work on this and call patient. Patient voiced understanding

## 2023-04-10 LAB — URINALYSIS, ROUTINE W REFLEX MICROSCOPIC
Bilirubin, UA: NEGATIVE
Leukocytes,UA: NEGATIVE
Nitrite, UA: NEGATIVE
Specific Gravity, UA: 1.03 (ref 1.005–1.030)
Urobilinogen, Ur: 1 mg/dL (ref 0.2–1.0)
pH, UA: 5.5 (ref 5.0–7.5)

## 2023-04-10 LAB — MICROSCOPIC EXAMINATION: RBC, Urine: >30 /hpf — AB (ref 0–2)

## 2023-04-11 LAB — URINE CULTURE

## 2023-04-13 ENCOUNTER — Ambulatory Visit: Payer: Medicare Other | Admitting: Urology

## 2023-04-16 ENCOUNTER — Ambulatory Visit (HOSPITAL_COMMUNITY)
Admission: RE | Admit: 2023-04-16 | Discharge: 2023-04-16 | Disposition: A | Discharge status: Home / Self Care | Payer: Medicare Other | Source: Ambulatory Visit | Attending: Urology | Admitting: Urology

## 2023-04-16 DIAGNOSIS — N210 Calculus in bladder: Secondary | ICD-10-CM | POA: Insufficient documentation

## 2023-04-16 DIAGNOSIS — N400 Enlarged prostate without lower urinary tract symptoms: Secondary | ICD-10-CM | POA: Diagnosis not present

## 2023-04-16 DIAGNOSIS — I2510 Atherosclerotic heart disease of native coronary artery without angina pectoris: Secondary | ICD-10-CM | POA: Diagnosis not present

## 2023-04-16 DIAGNOSIS — I700 Atherosclerosis of aorta: Secondary | ICD-10-CM | POA: Diagnosis not present

## 2023-04-16 DIAGNOSIS — N200 Calculus of kidney: Secondary | ICD-10-CM | POA: Insufficient documentation

## 2023-04-20 ENCOUNTER — Encounter: Payer: Self-pay | Admitting: Urology

## 2023-04-20 ENCOUNTER — Ambulatory Visit: Payer: Medicare Other | Admitting: Urology

## 2023-04-20 VITALS — BP 148/81 | HR 84

## 2023-04-20 DIAGNOSIS — R350 Frequency of micturition: Secondary | ICD-10-CM

## 2023-04-20 DIAGNOSIS — N200 Calculus of kidney: Secondary | ICD-10-CM

## 2023-04-20 DIAGNOSIS — N401 Enlarged prostate with lower urinary tract symptoms: Secondary | ICD-10-CM

## 2023-04-20 DIAGNOSIS — R310 Gross hematuria: Secondary | ICD-10-CM

## 2023-04-20 LAB — URINALYSIS, ROUTINE W REFLEX MICROSCOPIC
Bilirubin, UA: NEGATIVE
Leukocytes,UA: NEGATIVE
Nitrite, UA: NEGATIVE
RBC, UA: NEGATIVE
Specific Gravity, UA: 1.025 (ref 1.005–1.030)
Urobilinogen, Ur: 1 mg/dL (ref 0.2–1.0)
pH, UA: 6 (ref 5.0–7.5)

## 2023-04-20 LAB — MICROSCOPIC EXAMINATION: Bacteria, UA: NONE SEEN
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
## 2023-04-21 ENCOUNTER — Telehealth: Payer: Self-pay

## 2023-04-21 NOTE — Telephone Encounter (Signed)
Return call to patient about CT results. Patient is made aware once Dr. Ronne Binning review the CT scan someone will reach out with MD recommendation. Patient voiced understanding

## 2023-05-08 DIAGNOSIS — H52223 Regular astigmatism, bilateral: Secondary | ICD-10-CM | POA: Diagnosis not present

## 2023-05-08 DIAGNOSIS — H43813 Vitreous degeneration, bilateral: Secondary | ICD-10-CM | POA: Diagnosis not present

## 2023-05-10 DIAGNOSIS — E1165 Type 2 diabetes mellitus with hyperglycemia: Secondary | ICD-10-CM | POA: Diagnosis not present

## 2023-05-10 DIAGNOSIS — I10 Essential (primary) hypertension: Secondary | ICD-10-CM | POA: Diagnosis not present

## 2023-05-10 DIAGNOSIS — K219 Gastro-esophageal reflux disease without esophagitis: Secondary | ICD-10-CM | POA: Diagnosis not present

## 2023-05-10 DIAGNOSIS — E782 Mixed hyperlipidemia: Secondary | ICD-10-CM | POA: Diagnosis not present

## 2023-05-12 DIAGNOSIS — X32XXXD Exposure to sunlight, subsequent encounter: Secondary | ICD-10-CM | POA: Diagnosis not present

## 2023-05-12 DIAGNOSIS — Z85820 Personal history of malignant melanoma of skin: Secondary | ICD-10-CM | POA: Diagnosis not present

## 2023-05-12 DIAGNOSIS — D225 Melanocytic nevi of trunk: Secondary | ICD-10-CM | POA: Diagnosis not present

## 2023-05-12 DIAGNOSIS — L570 Actinic keratosis: Secondary | ICD-10-CM | POA: Diagnosis not present

## 2023-05-12 DIAGNOSIS — Z08 Encounter for follow-up examination after completed treatment for malignant neoplasm: Secondary | ICD-10-CM | POA: Diagnosis not present

## 2023-05-12 DIAGNOSIS — Z1283 Encounter for screening for malignant neoplasm of skin: Secondary | ICD-10-CM | POA: Diagnosis not present

## 2023-05-20 DIAGNOSIS — L110 Acquired keratosis follicularis: Secondary | ICD-10-CM | POA: Diagnosis not present

## 2023-05-20 DIAGNOSIS — E1140 Type 2 diabetes mellitus with diabetic neuropathy, unspecified: Secondary | ICD-10-CM | POA: Diagnosis not present

## 2023-05-20 DIAGNOSIS — B351 Tinea unguium: Secondary | ICD-10-CM | POA: Diagnosis not present

## 2023-06-15 ENCOUNTER — Ambulatory Visit (INDEPENDENT_AMBULATORY_CARE_PROVIDER_SITE_OTHER): Payer: Medicare Other | Admitting: Pulmonary Disease

## 2023-06-15 DIAGNOSIS — J849 Interstitial pulmonary disease, unspecified: Secondary | ICD-10-CM | POA: Diagnosis not present

## 2023-06-15 NOTE — Progress Notes (Signed)
Full PFT performed today. °

## 2023-06-15 NOTE — Patient Instructions (Signed)
Full PFT performed today. °

## 2023-06-19 ENCOUNTER — Telehealth: Payer: Self-pay

## 2023-06-19 NOTE — Telephone Encounter (Signed)
Patient made aware Samples of Gemtesa upfront

## 2023-06-23 ENCOUNTER — Ambulatory Visit: Payer: Medicare Other | Attending: Internal Medicine | Admitting: Internal Medicine

## 2023-06-23 ENCOUNTER — Encounter: Payer: Self-pay | Admitting: Internal Medicine

## 2023-06-23 VITALS — BP 130/72 | HR 91 | Ht 71.5 in | Wt 205.0 lb

## 2023-06-23 DIAGNOSIS — Z950 Presence of cardiac pacemaker: Secondary | ICD-10-CM

## 2023-06-23 LAB — CUP PACEART INCLINIC DEVICE CHECK
Date Time Interrogation Session: 20240813185621
Implantable Lead Connection Status: 753985
Implantable Lead Connection Status: 753985
Implantable Lead Implant Date: 20221222
Implantable Lead Implant Date: 20221222
Implantable Lead Location: 753858
Implantable Lead Location: 753859
Implantable Lead Model: 377171
Implantable Lead Model: 377171
Implantable Lead Serial Number: 8000640340
Implantable Lead Serial Number: 8000648576
Implantable Pulse Generator Implant Date: 20221222
Pulse Gen Model: 407145
Pulse Gen Serial Number: 70300615

## 2023-06-23 NOTE — Patient Instructions (Signed)
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
## 2023-06-25 ENCOUNTER — Ambulatory Visit: Payer: Medicare Other | Admitting: Pulmonary Disease

## 2023-06-25 ENCOUNTER — Encounter: Payer: Self-pay | Admitting: Pulmonary Disease

## 2023-06-25 VITALS — BP 118/70 | HR 79 | Ht 71.5 in | Wt 205.0 lb

## 2023-06-25 DIAGNOSIS — J849 Interstitial pulmonary disease, unspecified: Secondary | ICD-10-CM

## 2023-06-25 MED ORDER — BREZTRI AEROSPHERE 160-9-4.8 MCG/ACT IN AERO: 2.0000 | INHALATION_SPRAY | Freq: Two times a day (BID) | RESPIRATORY_TRACT | 11 refills | Status: DC

## 2023-06-25 MED ORDER — BREZTRI AEROSPHERE 160-9-4.8 MCG/ACT IN AERO: 2.0000 | INHALATION_SPRAY | Freq: Two times a day (BID) | RESPIRATORY_TRACT | Status: DC

## 2023-06-25 NOTE — Progress Notes (Signed)
Manuel Atkins    [id number]    [birth year]  Primary Care Physician:[facility]  Referring Physician: [facility] [address] [place],  [place] [address]  Chief complaint: Follow up for interstitial lung disease.   HPI: 89 y.o.  with history of OSA, ulcerative colitis, GERD, elevated transaminitis.  Hospitalized in early January 2019 at [facility] for dyspnea on exertion, hypoxia.  He had been evaluated with echo showing normal EF, proBNP was normal, d-dimer was normal.  He was started on Lasix without improvement.  He had a CT scan which showed mild bronchiectasis with basal interstitial lung disease.  He was started on 20 mg of prednisone and referred to pulmonary.    He was seen by pulmonary at [facility] but wants to transition care to [facility]. He follows with Dr. Karilyn Cota for ulcerative colitis and is on infliximab and mesalamine.  GERD is complicated by esophageal stricture status post dilation 4 years ago.  Symptoms are stable on PPI.  Elevated transaminases are thought to be secondary to fatty liver.  He still has occasional dysphagia and choking on food. Seen by Dr. Karilyn Cota and underwent dilation of esophageal stricture on 12/15/17.  Heartburn symptoms are controlled on therapy.  He is had a work-up by Dr. Rosemary Holms, cardiology with right and left heart cath showing nonobstructive coronary artery disease, no evidence of pulmonary hypertension. Finished pulmonary rehab in 2019  Diagnosed with segmental pulmonary embolism in November 2020 and currently on Eliquis anticoagulation.  He has discussed with his GI doctor and taken off infliximab in January 2021   Pets: Has dogs.  Exposed to farm animals in childhood.  No birds Occupation: Retired Acupuncturist.  Used to work in [employer]. Exposures: May have been exposed to asbestos.  Reports exposure to cotton dust.  He has a hobby of woodworking and is exposed to wood dust.  No mold, hot tubs.  jacuzzi. Smoking history: 10-pack-year smoking history.  Quit in 1980  Travel History: Not significant  Interim history: He has chronic dyspnea on exertion.  Does note some worsening congestion and dyspnea with heat and humidity on summer. Continues on the BiPAP.  Here for review of CT and PFTs.  Outpatient Encounter Medications as of 06/25/2023  Medication Sig   ACCU-CHEK GUIDE test strip USE AS DIRECTED TO CHECK BLOOD SUGAR ONCE DAILY   acetaminophen (TYLENOL) 500 MG tablet Take 1,000 mg by mouth every 6 (six) hours as needed for moderate pain or headache.   ALPRAZolam (XANAX) 0.25 MG tablet Take 0.5 tablets (0.125 mg total) by mouth 3 (three) times daily as needed for anxiety. for anxiety   amLODipine (NORVASC) 5 MG tablet Take 1 tablet (5 mg total) by mouth daily.   apixaban (ELIQUIS) 5 MG TABS tablet Take 1 tablet (5 mg total) by mouth 2 (two) times daily.   carvedilol (COREG) 3.125 MG tablet Take 1 tablet (3.125 mg total) by mouth 2 (two) times daily.   cetirizine (ZYRTEC) 10 MG tablet Take 10 mg by mouth at bedtime.   clotrimazole (CLOTRIMAZOLE ANTI-FUNGAL) 1 % cream Apply 1 Application topically 2 (two) times daily.   finasteride (PROSCAR) 5 MG tablet Take 1 tablet (5 mg total) by mouth daily.   glipiZIDE (GLUCOTROL XL) 10 MG 24 hr tablet Take 10 mg by mouth in the morning.   imipramine (TOFRANIL) 25 MG tablet Take 75 mg by mouth at bedtime.   loratadine (CLARITIN) 10 MG tablet Take  10 mg by mouth in the morning.   losartan (COZAAR) 25 MG tablet Take 1 tablet (25 mg total) by mouth daily.   LUMIGAN 0.01 % SOLN 1 drop at bedtime.   metFORMIN (GLUCOPHAGE-XR) 500 MG 24 hr tablet Take 1,000 mg by mouth in the morning and at bedtime.   Multiple Vitamins-Minerals (MULTIVITAMIN ADULTS 50+) TABS Take 1 tablet by mouth in the morning.   pantoprazole (PROTONIX) 40 MG tablet TAKE ONE TABLET BY MOUTH EVERY OTHER DAY   Probiotic Product (PROBIOTIC DAILY PO) Take 1 capsule by mouth in the  morning.   rosuvastatin (CRESTOR) 10 MG tablet Take 10 mg by mouth every evening.    sodium bicarbonate 650 MG tablet TAKE 1/2 TABLET BY MOUTH TWICE DAILY   tamsulosin (FLOMAX) 0.4 MG CAPS capsule Take 1 capsule (0.4 mg total) by mouth at bedtime.   Vibegron (GEMTESA) 75 MG TABS Take 1 tablet (75 mg total) by mouth daily.   [DISCONTINUED] diphenhydrAMINE (BENADRYL) 50 MG capsule Take 50 mg by mouth at bedtime.   [DISCONTINUED] HYDROcodone-acetaminophen (NORCO) 5-325 MG tablet Take 1 tablet by mouth every 6 (six) hours as needed for moderate pain.   [DISCONTINUED] ROCKLATAN 0.02-0.005 % SOLN Place 1 drop into both eyes at bedtime.   [DISCONTINUED] sodium bicarbonate 325 MG tablet Take 1 tablet (325 mg total) by mouth 2 (two) times daily.   [DISCONTINUED] traMADol (ULTRAM) 50 MG tablet Take 1 tablet (50 mg total) by mouth every 6 (six) hours as needed.   No facility-administered encounter medications on file as of 06/25/2023.   Physical Exam: Blood pressure 118/70, pulse 79, height 5' 11.5" (1.816 m), weight 205 lb (93 kg), SpO2 100%. Gen:      No acute distress HEENT:  EOMI, sclera anicteric Neck:     No masses; no thyromegaly Lungs:    Clear to auscultation bilaterally; normal respiratory effort CV:         Regular rate and rhythm; no murmurs Abd:      + bowel sounds; soft, non-tender; no palpable masses, no distension Ext:    No edema; adequate peripheral perfusion Skin:      Warm and dry; no rash Neuro: alert and oriented x 3 Psych: normal mood and affect   Data Reviewed: CT scan 07/27/2008-mild basal atelectasis right upper lobe pulmonary embolism CT scan 11/16/17-mild bronchiectasis, basal reticulation right greater than left.  Borderline right hilar lymphadenopathy.  Nodular liver contour possible cirrhosis High-resolution CT 01/26/18- patchy ground glass attenuation, mild bronchiectasis, septal thickening with no basal gradient.  No honeycombing. Indeterminate for UIP.  Hepatic  steatosis, aortic atherosclerosis, left main and left anterior coronary artery disease. High-resolution CT 05/04/2018-mild basal predominant lung fibrosis stable from prior exam. High-resolution CT 10/18/2018-stable interstitial lung disease CTA 09/26/2019-focal filling defect within a subsegmental branch of the right lower lobe pulmonary artery.  Equivocal for PE High-res CT 08/21/2020-stable interstitial lung disease High-res CT 10/17/2021-stable pattern of pulmonary fibrosis. High-res CT 02/17/2023-stable fibrotic and lung disease, probable UIP I have reviewed the images personally.  Barium swallow 05/31/13- mild impairment of esophageal motility, stricture at GE junction with obstruction of barium tablet.  Labs Connective tissue serologies 11/23/17-ANA, ACE, CCP, rheumatoid factor all negative Hypersenitivity panel-negative  PFTs  12/23/17 FVC 2.30 (54%), FEV1 1.97 [66%], F/F 86, TLC 64%, DLCO 42%  05/18/2018 FVC 2.32 [55%), FEV1 1.91 [65%], F/F 82, DLCO 43%  10/20/2018 FVC 2.30 [55%], FEV1 1.96 [6 6%], F/F 85, DLCO 46%  10/24/2019 FVC 2.29 [5%], FEV1 2.03 [70%],/89,  TLC 3.63 [49%], DLCO 13.57 [54%],  10/01/2020 FVC 2.28 [56%], FEV1 1.99 [69%], TLC 4.75 (64%), DLCO 15.21 [61%]  09/30/2021 FVC 2.09 [54%], FEV1 1.97 [70%], F/F90, TLC 3.86 [52%], DLCO 13.91 [56%]  06/15/2023 FVC 1.91 [4 7%], FEV1 1.68 [60%], F/F88, TLC 4.09 [55%], DLCO 10.23 [41%] Moderate restriction, diffusion defect   6-minute walk 03/03/18- 338 m 6-minute walk 05/20/18- 288 m 6-minute walk 10/27/18- 363 m  Cardiac RHC 04/20/18 RA: 9 mmHg RV: 29/7 mmHg PA: 29/12 mmHg, mean PAP 19 mmHg PCWP: 9 mmHg LVEDP: 14 mmHg  CO: 4.8 L/min CI: 2.3 L/min/m2  Sleep Received copy of sleep study from [facility], 08/01/2011 BiPAP titration.  Titrated to IPAP 17, EPAP 13.  Severe PLM's noted.  Consider treatment for restless leg syndrome.  Overnight oximetry 02/17/2020 overnight oximetry on room air on BiPAP (IPAP  17 cm, EPAP setting 13 cm)-duration of sleep 8 hours and 31 minutes, time spent below 88% 8 seconds.  Did not qualify for nocturnal oxygen use  Compliance report 06/29/2021 On BiPAP 16/12 97% compliant, residual AHI 4.5   Assessment:  Follow-up for bronchiectasis, interstitial lung disease. He could have interstitial lung disease from occupational, recreational exposure to cotton dust, wood dust, possible asbestos.  Other considerations include pneumonitis from ulcerative colitis or from infliximab, meslamine therapy or chronic aspiration from esophageal stricture.  Connective tissue serologies are negative. There is no clear evidence that he has IPF.   Cardiac work-up as noted above with no evidence of significant coronary artery disease or pulmonary hypertension. Cardio pulmonary exercise test shows dyspnea likely from combination of cardiac, pulmonary factors. For definite diagnosis of ILD and treatment he will need an open lung biopsy. We had an extensive discussion about risks benefits of the biopsy versus empirically treating him with anti-fibrotics Discussed the option of Envisia test through bronchoscope but he would like to defer all biopsies  Overall he is remained stable over the past couple of years and hence we are continuing conservative management  He wants to go back on [place] as he feels it helped in the past Check overnight oximetry.  He did not desat on exertion today.  Pulmonary embolism Findings are equivocal with nonspecific filling defects. He is currently on Eliquis Would continue anticoagulation given his underlying lung issues and significant dyspnea  OSA Continues on BiPAP Overnight oximetry in 2021 reviewed with no need for supplemental oxygen Download today shows good compliance and response  Recent fall Abnormal labs with hyperkalemia, AKI He is getting physical therapy now.  He has follow-up with primary care 1 month away In the meantime I will order a  repeat BMP to make sure his elevated potassium and creatinine are improved  Plan/Recommendations: - Breztri - Overnight oximetry - Continue BiPAP  Chilton Greathouse MD [facility] 06/25/2023, 11:47 AM  CC: [facility]*

## 2023-06-25 NOTE — Patient Instructions (Addendum)
Will start you on an inhaler called Breztri Will check overnight oximetry to make sure you are not desatting at night Follow-up in 6 months

## 2023-06-29 ENCOUNTER — Other Ambulatory Visit: Payer: Self-pay | Admitting: Pulmonary Disease

## 2023-06-30 DIAGNOSIS — G4730 Sleep apnea, unspecified: Secondary | ICD-10-CM | POA: Diagnosis not present

## 2023-06-30 DIAGNOSIS — R0902 Hypoxemia: Secondary | ICD-10-CM | POA: Diagnosis not present

## 2023-07-08 ENCOUNTER — Telehealth: Payer: Self-pay

## 2023-07-08 NOTE — Telephone Encounter (Signed)
Return call to patient. Patient was in pain all day yesterday with lower back pain and states that he is passing blood with urination. Patient pain had subsided as on now and wants to know if he should be seen before his scheduled appointment 10/16. Patient is aware a message will be sent to Dr. Ronne Binning on recommendation. Voiced understanding. Per Dr.McKenzie patient can do a urine drop off. Patient is scheduled for urine drop off tomorrow.

## 2023-07-09 ENCOUNTER — Telehealth: Payer: Self-pay

## 2023-07-09 ENCOUNTER — Ambulatory Visit: Payer: Medicare Other

## 2023-07-09 DIAGNOSIS — M545 Low back pain, unspecified: Secondary | ICD-10-CM

## 2023-07-09 DIAGNOSIS — R310 Gross hematuria: Secondary | ICD-10-CM | POA: Diagnosis not present

## 2023-07-09 LAB — URINALYSIS, ROUTINE W REFLEX MICROSCOPIC
Bilirubin, UA: NEGATIVE
Nitrite, UA: NEGATIVE
Specific Gravity, UA: 1.025 (ref 1.005–1.030)
Urobilinogen, Ur: 1 mg/dL (ref 0.2–1.0)
pH, UA: 6 (ref 5.0–7.5)

## 2023-07-09 LAB — MICROSCOPIC EXAMINATION
Bacteria, UA: NONE SEEN
RBC, Urine: >30 /hpf — AB (ref 0–2)

## 2023-07-09 NOTE — Telephone Encounter (Addendum)
-----   Message from [place] sent at 07/09/2023 12:19 PM EDT ----- Please let patient know that today's UA showed no evidence of UTI. He has history of gross hematuria; >30 RBC/hpf on today's urine microscopy. Follow up as planned.

## 2023-07-09 NOTE — Progress Notes (Signed)
Per 8/28 telephone encounter, Dr. Ronne Binning gave the ok for patient to drop off urine specimen due to lower back pain and hematuria.

## 2023-07-17 ENCOUNTER — Telehealth: Payer: Self-pay | Admitting: Pulmonary Disease

## 2023-07-17 DIAGNOSIS — G4733 Obstructive sleep apnea (adult) (pediatric): Secondary | ICD-10-CM

## 2023-07-17 NOTE — Telephone Encounter (Signed)
[facility] and wife are calling in regards to patient's Bipap supplies and Bipap. His Bipap is currently not working. [facility] will need a new prescription to get the new Bipap Supplies and any office notes regarding his need for it. Please call back at [phone] fax [phone]

## 2023-07-21 DIAGNOSIS — N1832 Chronic kidney disease, stage 3b: Secondary | ICD-10-CM | POA: Diagnosis not present

## 2023-07-21 DIAGNOSIS — K219 Gastro-esophageal reflux disease without esophagitis: Secondary | ICD-10-CM | POA: Diagnosis not present

## 2023-07-21 DIAGNOSIS — Z0000 Encounter for general adult medical examination without abnormal findings: Secondary | ICD-10-CM | POA: Diagnosis not present

## 2023-07-21 DIAGNOSIS — E039 Hypothyroidism, unspecified: Secondary | ICD-10-CM | POA: Diagnosis not present

## 2023-07-21 DIAGNOSIS — E1165 Type 2 diabetes mellitus with hyperglycemia: Secondary | ICD-10-CM | POA: Diagnosis not present

## 2023-07-21 DIAGNOSIS — E782 Mixed hyperlipidemia: Secondary | ICD-10-CM | POA: Diagnosis not present

## 2023-07-21 DIAGNOSIS — E559 Vitamin D deficiency, unspecified: Secondary | ICD-10-CM | POA: Diagnosis not present

## 2023-07-21 DIAGNOSIS — E7849 Other hyperlipidemia: Secondary | ICD-10-CM | POA: Diagnosis not present

## 2023-07-22 NOTE — Telephone Encounter (Signed)
Order for new equipment placed.

## 2023-07-28 DIAGNOSIS — K5100 Ulcerative (chronic) pancolitis without complications: Secondary | ICD-10-CM | POA: Diagnosis not present

## 2023-07-28 DIAGNOSIS — E1122 Type 2 diabetes mellitus with diabetic chronic kidney disease: Secondary | ICD-10-CM | POA: Diagnosis not present

## 2023-07-28 DIAGNOSIS — Z23 Encounter for immunization: Secondary | ICD-10-CM | POA: Diagnosis not present

## 2023-07-28 DIAGNOSIS — K7581 Nonalcoholic steatohepatitis (NASH): Secondary | ICD-10-CM | POA: Diagnosis not present

## 2023-07-28 DIAGNOSIS — I2699 Other pulmonary embolism without acute cor pulmonale: Secondary | ICD-10-CM | POA: Diagnosis not present

## 2023-07-28 DIAGNOSIS — R4582 Worries: Secondary | ICD-10-CM | POA: Diagnosis not present

## 2023-07-28 DIAGNOSIS — E7849 Other hyperlipidemia: Secondary | ICD-10-CM | POA: Diagnosis not present

## 2023-07-28 DIAGNOSIS — G43909 Migraine, unspecified, not intractable, without status migrainosus: Secondary | ICD-10-CM | POA: Diagnosis not present

## 2023-07-28 DIAGNOSIS — M549 Dorsalgia, unspecified: Secondary | ICD-10-CM | POA: Diagnosis not present

## 2023-07-28 DIAGNOSIS — I10 Essential (primary) hypertension: Secondary | ICD-10-CM | POA: Diagnosis not present

## 2023-07-28 DIAGNOSIS — D692 Other nonthrombocytopenic purpura: Secondary | ICD-10-CM | POA: Diagnosis not present

## 2023-07-28 DIAGNOSIS — D696 Thrombocytopenia, unspecified: Secondary | ICD-10-CM | POA: Diagnosis not present

## 2023-07-29 DIAGNOSIS — B351 Tinea unguium: Secondary | ICD-10-CM | POA: Diagnosis not present

## 2023-07-29 DIAGNOSIS — L110 Acquired keratosis follicularis: Secondary | ICD-10-CM | POA: Diagnosis not present

## 2023-07-29 DIAGNOSIS — E1140 Type 2 diabetes mellitus with diabetic neuropathy, unspecified: Secondary | ICD-10-CM | POA: Diagnosis not present

## 2023-07-30 ENCOUNTER — Telehealth: Payer: Self-pay | Admitting: Pulmonary Disease

## 2023-07-30 NOTE — Telephone Encounter (Signed)
Pt called in bc he is having trouble with getting his BiPap

## 2023-08-04 NOTE — Telephone Encounter (Signed)
I do not see the order for Bipap signature in my inbox. Does it need to be placed again?   Thanks

## 2023-08-04 NOTE — Telephone Encounter (Signed)
The order is in the system.  I will send to [facility] to see what is going on with the order.  PCCs, Please advise what is going on with order for Bipap, the order is in the system and Dr. Isaiah Serge states there is nothing for him to sign.  Thank you.

## 2023-08-04 NOTE — Telephone Encounter (Signed)
PT's wife states the supplier still does not have completed order for his BIPAP.   PT 's wife is upset it is taking this long. [facility] says it is missing the Dr's signature. Order was placed 9/11. Please call PT to advise action taken. Her # is [phone]

## 2023-08-05 ENCOUNTER — Encounter: Payer: Self-pay | Admitting: Pulmonary Disease

## 2023-08-05 NOTE — Progress Notes (Signed)
PCCM note  Overnight oximetry dated 06/30/2023 on room air Duration of study 9 hours, 9 minutes Time spent less than 88% 2 minutes 12 seconds   He has minimal desats during the night.  Continue current therapy.  Please  Chilton Greathouse MD [facility] 08/05/2023, 10:39 AM

## 2023-08-05 NOTE — Telephone Encounter (Signed)
Did you receive CMN?

## 2023-08-05 NOTE — Telephone Encounter (Signed)
[facility] Fail - please follow up on this & provide an update to the patient.    Thanks!

## 2023-08-05 NOTE — Telephone Encounter (Signed)
I called [facility] and they said they will refax the req for signature to [facility] fax. They do not have a written or electronic signature on the order.

## 2023-08-05 NOTE — Telephone Encounter (Signed)
PT wife calling again. Very upset that since 9/11 [facility] says they can not process this order. Pls call PT wife @ [phone]

## 2023-08-05 NOTE — Telephone Encounter (Signed)
These are dont by Chantel C I will have to get her to check

## 2023-08-05 NOTE — Telephone Encounter (Signed)
I called [facility] and spoke with Sue Lush she stated they have everything that they need but they had faxed the CMN on 07/31/23 to a [phone] number waiting for it to be returned. I told them to fax the CMN to Chan's number [phone]. I have also called and spoke with Mrs. Mullan and made her aware they had the incorrect fax number

## 2023-08-10 NOTE — Telephone Encounter (Signed)
Pt wife called in to check on her husband CMN. Informed Pt we have rcvd the CMN and we will get it signed when Dr. Isaiah Serge gets in office on 10/02

## 2023-08-11 ENCOUNTER — Telehealth: Payer: Self-pay

## 2023-08-11 NOTE — Telephone Encounter (Signed)
Patient needing samples.  Vibegron (GEMTESA) 75 MG TABS   Wanting to come by and pick up samples.  Please advise.

## 2023-08-11 NOTE — Telephone Encounter (Signed)
Wife called and made aware that samples will be placed up front.

## 2023-08-12 IMAGING — CT CT CHEST HIGH RESOLUTION
3 of 5 series · 14 of 36 positions shown, 15 images · non-contrast
Comparison: 08/21/2020 in 10/18/2018.

CLINICAL DATA: Interstitial lung disease, weakness and chronic
shortness of breath.

EXAM:
CT CHEST WITHOUT CONTRAST
TECHNIQUE: Multidetector CT imaging of the chest was performed following the
standard protocol without intravenous contrast. High resolution
imaging of the lungs, as well as inspiratory and expiratory imaging,
was performed.

[Series 3: standard chest · axial · 0.75mm/px · z∈[+1286,+1512]mm · 8 of 141 slices shown]
[im 14/141  mediastinal]
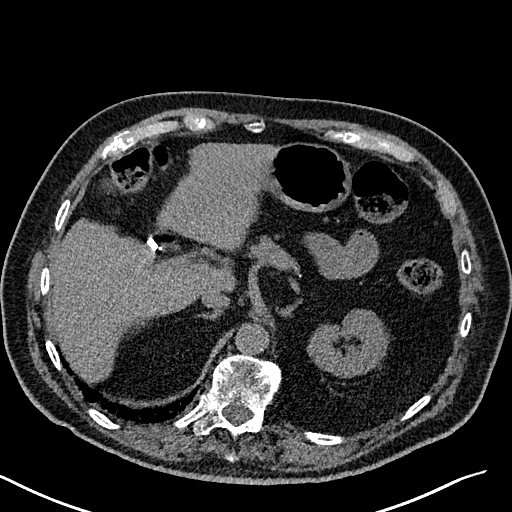
[im 27/141  mediastinal]
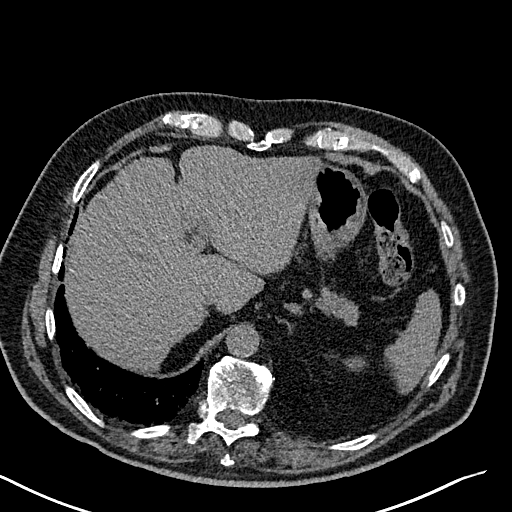
[im 47/141  mediastinal]
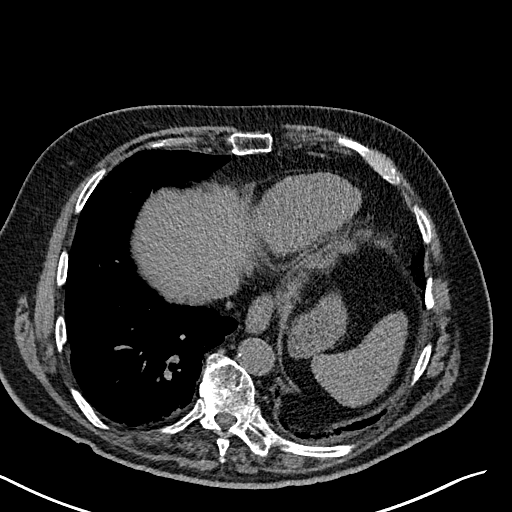
[im 61/141  mediastinal]
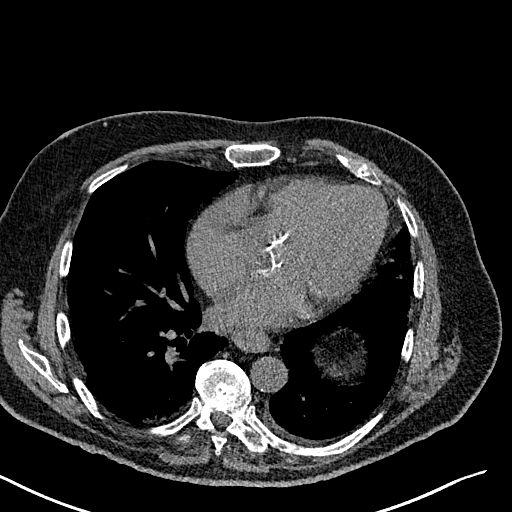
[im 81/141  mediastinal]
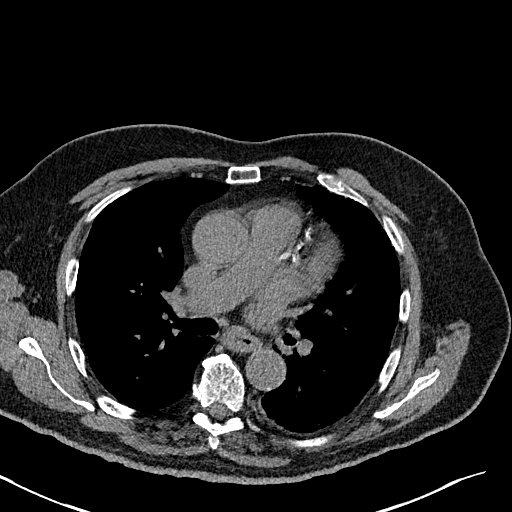
[im 94/141  mediastinal]
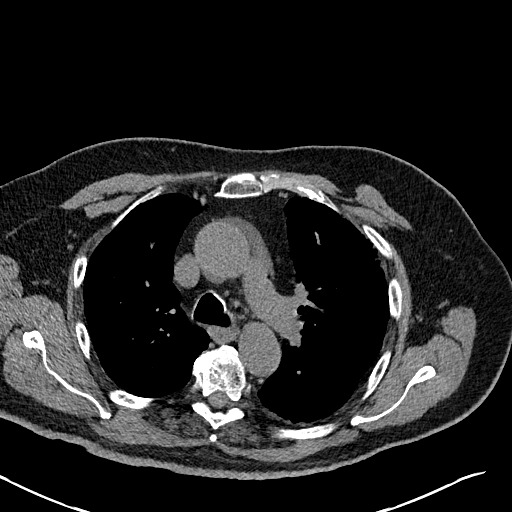
[im 114/141  mediastinal]
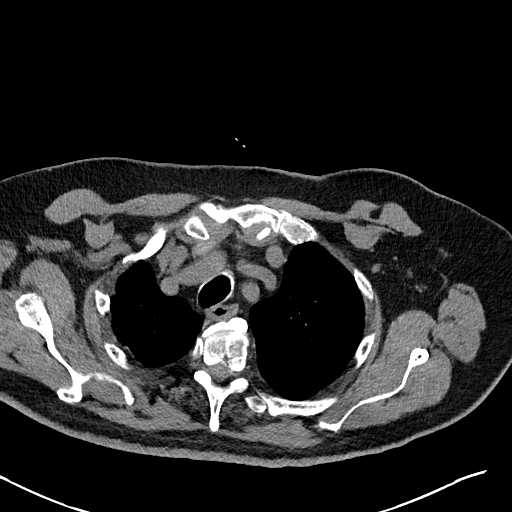
[im 127/141  mediastinal]
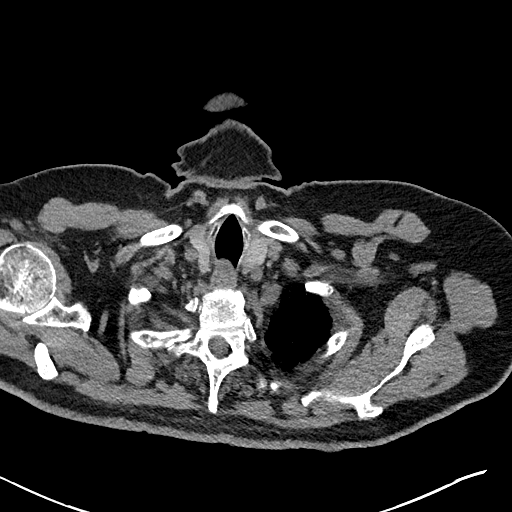

[Series 4: high res insp · axial · 0.76mm/px · z∈[+1236,+1536]mm · 3 of 22 slices shown, 4 images]
[im 1/22  mediastinal]
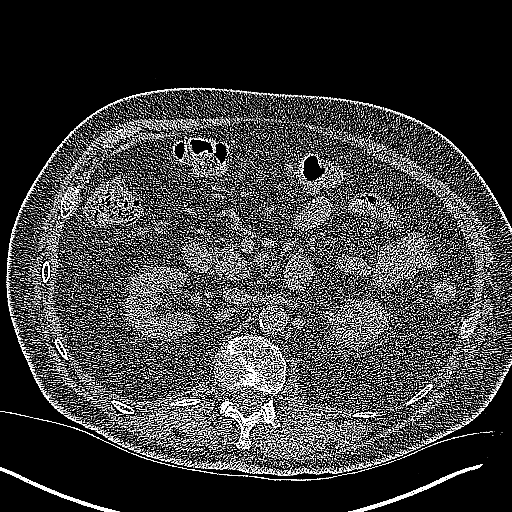
[im 1/22  lung]
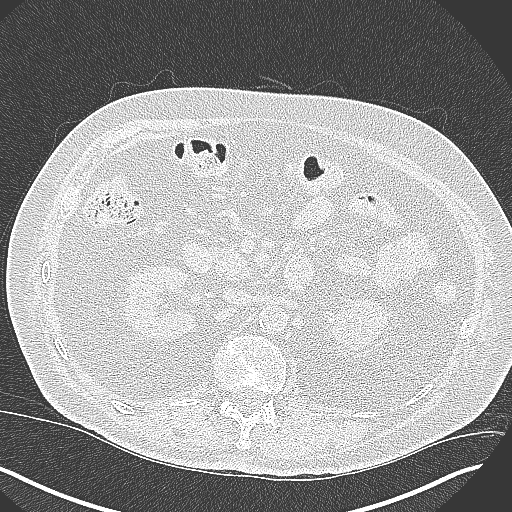
[im 11/22  lung]
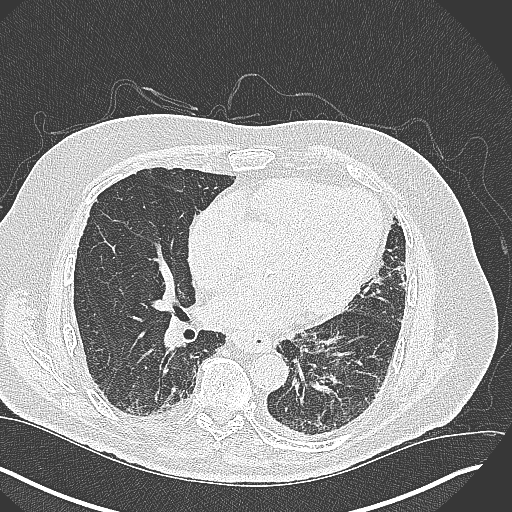
[im 22/22  lung]
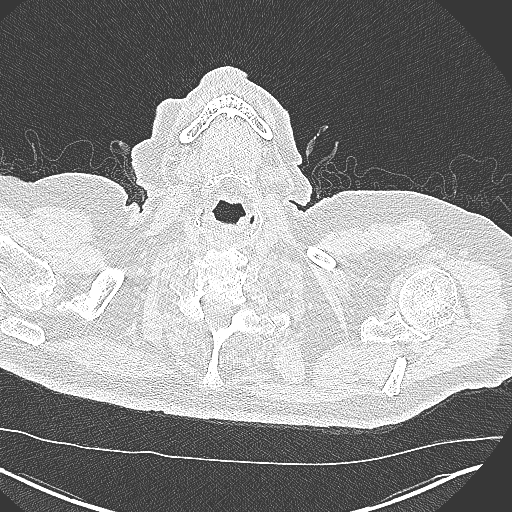

[Series 9: coronal · coronal · 0.59mm/px · 3 of 175 slices shown]
[im 35/175  lung]
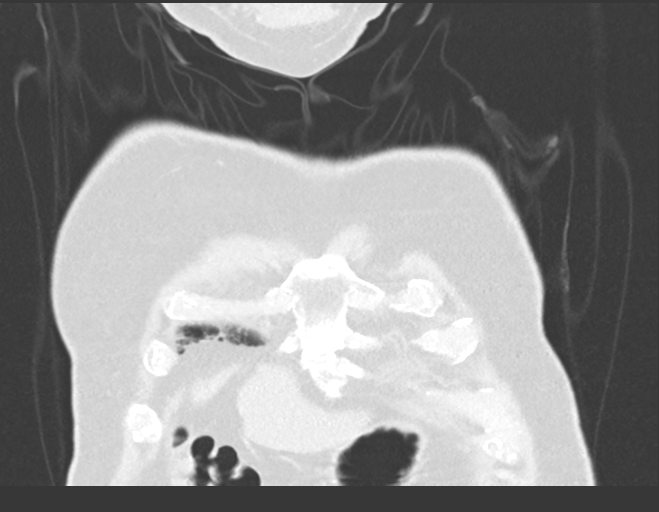
[im 70/175  lung]
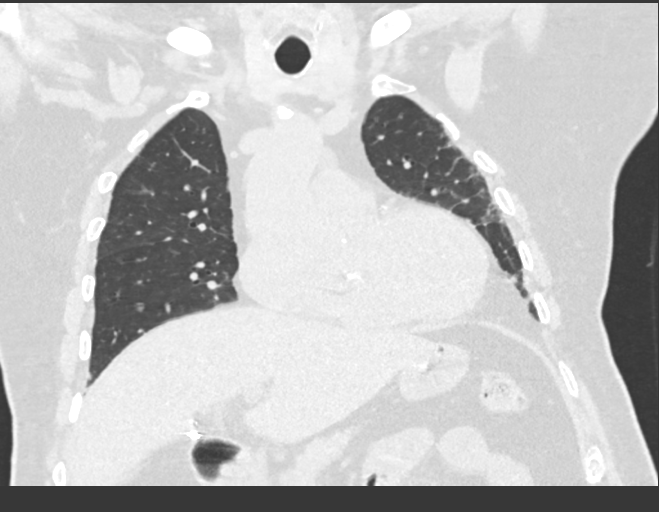
[im 105/175  lung]
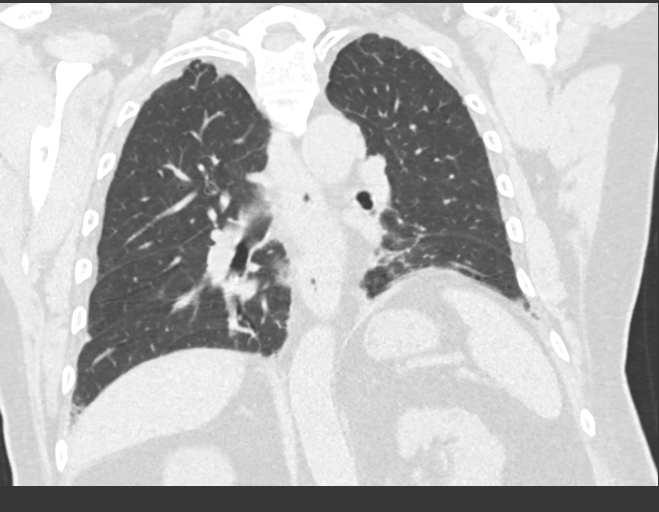

[14 of 36 positions shown; findings below may reference images not displayed]

FINDINGS: Cardiovascular: Atherosclerotic calcification of the aorta, aortic
valve and coronary arteries. Pulmonic trunk and heart are enlarged.
No pericardial effusion.

Mediastinum/Nodes: No pathologically enlarged mediastinal or
axillary lymph nodes. Hilar regions are difficult to evaluate
without IV contrast. Esophagus is grossly unremarkable.

Lungs/Pleura: Biapical pleuroparenchymal scarring. Peripheral and
basilar subpleural reticulation, ground-glass and traction
bronchiolectasis/bronchiolectasis, similar to 08/21/2020. Pleural
calcification in the anterior left hemithorax. A few scattered
millimetric pulmonary nodules are unchanged and considered benign.
Pleural thickening in the posterior left hemithorax. No pleural
fluid. Airway is unremarkable.

Upper Abdomen: Liver margin is slightly irregular. Cholecystectomy.
Visualized portions of the adrenal glands and right kidney are
unremarkable. Punctate stone in the left kidney. Visualized portions
of the spleen, pancreas, stomach and bowel are grossly unremarkable.
Left hemidiaphragm is elevated. No upper abdominal adenopathy.

Musculoskeletal: Degenerative changes in the spine. Flowing anterior
osteophytosis in the thoracic spine. Old sternal fracture. No
worrisome lytic or sclerotic lesions.
IMPRESSION: 1. Pulmonary parenchymal pattern of fibrosis appears stable from
08/21/2020 and may be due to fibrotic nonspecific interstitial
pneumonitis or usual interstitial pneumonitis. Findings are
categorized as probable UIP per consensus guidelines: Diagnosis of
Idiopathic Pulmonary Fibrosis: An Official ATS/ERS/JRS/ALAT Clinical
Practice Guideline. Am J Respir Crit Care Med Vol 198, Mathieu 5,
ppe22-e[DATE].
2. Marginal irregularity of the liver is indicative of cirrhosis.
3. Punctate left renal stone.
4. Aortic atherosclerosis ([id number]). Coronary artery
calcification.
5. Enlarged pulmonic trunk, indicative of pulmonary arterial
hypertension.

## 2023-08-12 NOTE — Telephone Encounter (Signed)
Patient's wife is calling back to see if form was signed and sent back over. Please call and advise.

## 2023-08-12 NOTE — Telephone Encounter (Signed)
I spoke with Manuel Atkins & let her know that I have faxed the signed order to [facility].  Manuel Atkins verbalized understanding & stated nothing further needed at this time.

## 2023-08-12 NOTE — Telephone Encounter (Signed)
Fax confirmation below:  This message was sent via FAXCOM, a product from [employer]. [email or web address]/                    -------Fax Transmission Report-------  To:               Recipient at [phone] Subject:          FW: Do Not Reply Result:           The transmission was successful. Explanation:      All Pages Ok Pages Sent:       3 Connect Time:     1 minutes, 17 seconds Transmit Time:    08/12/2023 16:56 Transfer Rate:    [id number] Status Code:      0000 Retry Count:      0 Job Id:           [id number] Unique Id:        [id number] Fax Line:         18 Fax Server:       [employer]

## 2023-08-17 ENCOUNTER — Telehealth: Payer: Self-pay | Admitting: Pulmonary Disease

## 2023-08-17 NOTE — Telephone Encounter (Signed)
PT calling about Bipap again. I was able to pull the [facility] fax off the machine and put to [facility] attention in PCC's box here by front desk. I alerted [facility]. PT wants to thank Dr. Isaiah Serge for his efforts.

## 2023-08-17 NOTE — Telephone Encounter (Signed)
[facility], please advise if you received fax.  Spoke to patient's spouse, Billie(DPR). She stated that patient's current machine is broken. She would like to expedite order.

## 2023-08-18 NOTE — Telephone Encounter (Signed)
I spoke to pt's spouse Willaim Sheng & let her know I am working on this.  I will speak with her in the morning once I get a returned call from [place].

## 2023-08-26 ENCOUNTER — Ambulatory Visit: Payer: Medicare Other | Admitting: Urology

## 2023-08-26 VITALS — BP 91/59 | HR 101

## 2023-08-26 DIAGNOSIS — N138 Other obstructive and reflux uropathy: Secondary | ICD-10-CM

## 2023-08-26 DIAGNOSIS — R3915 Urgency of urination: Secondary | ICD-10-CM

## 2023-08-26 DIAGNOSIS — N200 Calculus of kidney: Secondary | ICD-10-CM | POA: Diagnosis not present

## 2023-08-26 DIAGNOSIS — R350 Frequency of micturition: Secondary | ICD-10-CM | POA: Diagnosis not present

## 2023-08-26 DIAGNOSIS — N401 Enlarged prostate with lower urinary tract symptoms: Secondary | ICD-10-CM | POA: Diagnosis not present

## 2023-08-26 LAB — MICROSCOPIC EXAMINATION: Bacteria, UA: NONE SEEN

## 2023-08-26 LAB — URINALYSIS, ROUTINE W REFLEX MICROSCOPIC
Bilirubin, UA: NEGATIVE
Leukocytes,UA: NEGATIVE
Nitrite, UA: NEGATIVE
RBC, UA: NEGATIVE
Specific Gravity, UA: 1.025 (ref 1.005–1.030)
Urobilinogen, Ur: 1 mg/dL (ref 0.2–1.0)
pH, UA: 6 (ref 5.0–7.5)

## 2023-08-26 MED ORDER — TAMSULOSIN HCL 0.4 MG PO CAPS
0.4000 mg | ORAL_CAPSULE | Freq: Every day | ORAL | 11 refills | Status: DC
Start: 2023-08-26 — End: 2024-02-03

## 2023-08-26 MED ORDER — SODIUM BICARBONATE 650 MG PO TABS
325.0000 mg | ORAL_TABLET | Freq: Two times a day (BID) | ORAL | 11 refills | Status: AC
Start: 2023-08-26 — End: ?

## 2023-08-26 MED ORDER — FINASTERIDE 5 MG PO TABS
5.0000 mg | ORAL_TABLET | Freq: Every day | ORAL | 3 refills | Status: DC
Start: 2023-08-26 — End: 2024-08-03
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
## 2023-08-31 DIAGNOSIS — Z23 Encounter for immunization: Secondary | ICD-10-CM | POA: Diagnosis not present

## 2023-09-03 ENCOUNTER — Encounter: Payer: Self-pay | Admitting: Urology

## 2023-09-03 NOTE — Patient Instructions (Signed)

## 2023-09-11 ENCOUNTER — Encounter: Payer: Self-pay | Admitting: Pulmonary Disease

## 2023-09-21 DIAGNOSIS — H401111 Primary open-angle glaucoma, right eye, mild stage: Secondary | ICD-10-CM | POA: Diagnosis not present

## 2023-10-14 DIAGNOSIS — E1140 Type 2 diabetes mellitus with diabetic neuropathy, unspecified: Secondary | ICD-10-CM | POA: Diagnosis not present

## 2023-10-14 DIAGNOSIS — B351 Tinea unguium: Secondary | ICD-10-CM | POA: Diagnosis not present

## 2023-10-14 DIAGNOSIS — L110 Acquired keratosis follicularis: Secondary | ICD-10-CM | POA: Diagnosis not present

## 2023-10-20 DIAGNOSIS — Z9181 History of falling: Secondary | ICD-10-CM | POA: Diagnosis not present

## 2023-10-20 DIAGNOSIS — R2681 Unsteadiness on feet: Secondary | ICD-10-CM | POA: Diagnosis not present

## 2023-10-20 DIAGNOSIS — M6281 Muscle weakness (generalized): Secondary | ICD-10-CM | POA: Diagnosis not present

## 2023-10-20 NOTE — Telephone Encounter (Signed)
NFN 

## 2023-10-23 DIAGNOSIS — Z9181 History of falling: Secondary | ICD-10-CM | POA: Diagnosis not present

## 2023-10-23 DIAGNOSIS — M6281 Muscle weakness (generalized): Secondary | ICD-10-CM | POA: Diagnosis not present

## 2023-10-23 DIAGNOSIS — R2681 Unsteadiness on feet: Secondary | ICD-10-CM | POA: Diagnosis not present

## 2023-10-26 ENCOUNTER — Telehealth: Payer: Self-pay | Admitting: Urology

## 2023-10-26 NOTE — Telephone Encounter (Signed)
Needs samples of gemtesa

## 2023-10-26 NOTE — Telephone Encounter (Signed)
Patient made aware samples will be placed up front.

## 2023-10-28 DIAGNOSIS — M6281 Muscle weakness (generalized): Secondary | ICD-10-CM | POA: Diagnosis not present

## 2023-10-28 DIAGNOSIS — Z9181 History of falling: Secondary | ICD-10-CM | POA: Diagnosis not present

## 2023-10-28 DIAGNOSIS — R2681 Unsteadiness on feet: Secondary | ICD-10-CM | POA: Diagnosis not present

## 2023-10-30 DIAGNOSIS — M6281 Muscle weakness (generalized): Secondary | ICD-10-CM | POA: Diagnosis not present

## 2023-10-30 DIAGNOSIS — R2681 Unsteadiness on feet: Secondary | ICD-10-CM | POA: Diagnosis not present

## 2023-10-30 DIAGNOSIS — Z9181 History of falling: Secondary | ICD-10-CM | POA: Diagnosis not present

## 2023-11-02 DIAGNOSIS — M6281 Muscle weakness (generalized): Secondary | ICD-10-CM | POA: Diagnosis not present

## 2023-11-02 DIAGNOSIS — R2681 Unsteadiness on feet: Secondary | ICD-10-CM | POA: Diagnosis not present

## 2023-11-02 DIAGNOSIS — Z9181 History of falling: Secondary | ICD-10-CM | POA: Diagnosis not present

## 2023-11-05 DIAGNOSIS — Z9181 History of falling: Secondary | ICD-10-CM | POA: Diagnosis not present

## 2023-11-05 DIAGNOSIS — R2681 Unsteadiness on feet: Secondary | ICD-10-CM | POA: Diagnosis not present

## 2023-11-05 DIAGNOSIS — M6281 Muscle weakness (generalized): Secondary | ICD-10-CM | POA: Diagnosis not present

## 2023-11-09 DIAGNOSIS — R2681 Unsteadiness on feet: Secondary | ICD-10-CM | POA: Diagnosis not present

## 2023-11-09 DIAGNOSIS — Z9181 History of falling: Secondary | ICD-10-CM | POA: Diagnosis not present

## 2023-11-09 DIAGNOSIS — M6281 Muscle weakness (generalized): Secondary | ICD-10-CM | POA: Diagnosis not present

## 2023-11-12 DIAGNOSIS — M6281 Muscle weakness (generalized): Secondary | ICD-10-CM | POA: Diagnosis not present

## 2023-11-12 DIAGNOSIS — R2681 Unsteadiness on feet: Secondary | ICD-10-CM | POA: Diagnosis not present

## 2023-11-12 DIAGNOSIS — Z9181 History of falling: Secondary | ICD-10-CM | POA: Diagnosis not present

## 2023-11-18 DIAGNOSIS — N1832 Chronic kidney disease, stage 3b: Secondary | ICD-10-CM | POA: Diagnosis not present

## 2023-11-18 DIAGNOSIS — E1165 Type 2 diabetes mellitus with hyperglycemia: Secondary | ICD-10-CM | POA: Diagnosis not present

## 2023-11-18 DIAGNOSIS — E1122 Type 2 diabetes mellitus with diabetic chronic kidney disease: Secondary | ICD-10-CM | POA: Diagnosis not present

## 2023-11-18 DIAGNOSIS — I10 Essential (primary) hypertension: Secondary | ICD-10-CM | POA: Diagnosis not present

## 2023-11-18 DIAGNOSIS — E7849 Other hyperlipidemia: Secondary | ICD-10-CM | POA: Diagnosis not present

## 2023-11-18 DIAGNOSIS — R739 Hyperglycemia, unspecified: Secondary | ICD-10-CM | POA: Diagnosis not present

## 2023-11-18 DIAGNOSIS — K7581 Nonalcoholic steatohepatitis (NASH): Secondary | ICD-10-CM | POA: Diagnosis not present

## 2023-11-24 ENCOUNTER — Telehealth: Payer: Self-pay | Admitting: Urology

## 2023-11-24 NOTE — Telephone Encounter (Signed)
 Patient made aware and voiced understanding.

## 2023-11-24 NOTE — Telephone Encounter (Signed)
 Needs samples of Gemtesa

## 2023-11-25 DIAGNOSIS — E1122 Type 2 diabetes mellitus with diabetic chronic kidney disease: Secondary | ICD-10-CM | POA: Diagnosis not present

## 2023-11-30 ENCOUNTER — Ambulatory Visit: Payer: Medicare Other | Admitting: Pulmonary Disease

## 2023-11-30 VITALS — BP 130/81 | HR 79 | Ht 72.0 in | Wt 208.4 lb

## 2023-11-30 DIAGNOSIS — G4733 Obstructive sleep apnea (adult) (pediatric): Secondary | ICD-10-CM | POA: Diagnosis not present

## 2023-11-30 DIAGNOSIS — K5100 Ulcerative (chronic) pancolitis without complications: Secondary | ICD-10-CM | POA: Diagnosis not present

## 2023-11-30 DIAGNOSIS — I10 Essential (primary) hypertension: Secondary | ICD-10-CM | POA: Diagnosis not present

## 2023-11-30 DIAGNOSIS — J849 Interstitial pulmonary disease, unspecified: Secondary | ICD-10-CM | POA: Diagnosis not present

## 2023-11-30 DIAGNOSIS — K7581 Nonalcoholic steatohepatitis (NASH): Secondary | ICD-10-CM | POA: Diagnosis not present

## 2023-11-30 NOTE — Progress Notes (Signed)
VRAJ TEASE    [id number]    [birth year]  Primary Care Physician:[facility]  Referring Physician: [facility] [address] [place],  [place] [address]  Chief complaint: Follow up for interstitial lung disease.   HPI: 89 y.o.  with history of OSA, ulcerative colitis, GERD, elevated transaminitis.  Hospitalized in early January 2019 at [facility] for dyspnea on exertion, hypoxia.  He had been evaluated with echo showing normal EF, proBNP was normal, d-dimer was normal.  He was started on Lasix without improvement.  He had a CT scan which showed mild bronchiectasis with basal interstitial lung disease.  He was started on 20 mg of prednisone and referred to pulmonary.    He was seen by pulmonary at [facility] but wants to transition care to [facility]. He follows with Dr. Karilyn Cota for ulcerative colitis and is on infliximab and mesalamine.  GERD is complicated by esophageal stricture status post dilation 4 years ago.  Symptoms are stable on PPI.  Elevated transaminases are thought to be secondary to fatty liver.  He still has occasional dysphagia and choking on food. Seen by Dr. Karilyn Cota and underwent dilation of esophageal stricture on 12/15/17.  Heartburn symptoms are controlled on therapy.  He is had a work-up by Dr. Rosemary Holms, cardiology with right and left heart cath showing nonobstructive coronary artery disease, no evidence of pulmonary hypertension. Finished pulmonary rehab in 2019  Diagnosed with segmental pulmonary embolism in November 2020 and currently on Eliquis anticoagulation.  He has discussed with his GI doctor and taken off infliximab in January 2021   Pets: Has dogs.  Exposed to farm animals in childhood.  No birds Occupation: Retired Acupuncturist.  Used to work in [employer]. Exposures: May have been exposed to asbestos.  Reports exposure to cotton dust.  He has a hobby of woodworking and is exposed to wood dust.  No mold, hot tubs.  jacuzzi. Smoking history: 10-pack-year smoking history.  Quit in 1980  Travel History: Not significant  Interim history: Discussed the use of AI scribe software for clinical note transcription with the patient, who gave verbal consent to proceed.  The patient, with a history of interstitial lung disease, presents for a follow-up after receiving a new BiPAP machine. He reports that his breathing is 'pretty much' stable. He has been using the new BiPAP machine daily for approximately eight and a half hours each day, and he believes it is working effectively.  He was previously on Breztri inhaler for his lung disease, but he stopped it as he did not notice any improvement. He has declined to try more samples of the inhaler, stating that he 'can't tell a bit of difference.'  Additionally, the patient mentions a family member who recently had knee surgery and is still in pain.   Outpatient Encounter Medications as of 11/30/2023  Medication Sig   ACCU-CHEK GUIDE test strip USE AS DIRECTED TO CHECK BLOOD SUGAR ONCE DAILY   acetaminophen (TYLENOL) 500 MG tablet Take 1,000 mg by mouth every 6 (six) hours as needed for moderate pain or headache.   ALPRAZolam (XANAX) 0.25 MG tablet Take 0.5 tablets (0.125 mg total) by mouth 3 (three) times daily as needed for anxiety. for anxiety   Budeson-Glycopyrrol-Formoterol (BREZTRI AEROSPHERE) 160-9-4.8 MCG/ACT AERO Inhale 2 puffs into the lungs in the morning and at bedtime.   Budeson-Glycopyrrol-Formoterol (BREZTRI AEROSPHERE) 160-9-4.8 MCG/ACT AERO Inhale 2 puffs into the lungs in the morning and at  bedtime.   carvedilol (COREG) 3.125 MG tablet Take 1 tablet (3.125 mg total) by mouth 2 (two) times daily.   cetirizine (ZYRTEC) 10 MG tablet Take 10 mg by mouth at bedtime.   ELIQUIS 5 MG TABS tablet TAKE 1 TABLET BY MOUTH TWICE DAILY   finasteride (PROSCAR) 5 MG tablet Take 1 tablet (5 mg total) by mouth daily.   glipiZIDE (GLUCOTROL XL) 10 MG 24 hr tablet Take 10  mg by mouth in the morning.   imipramine (TOFRANIL) 25 MG tablet Take 75 mg by mouth at bedtime.   loratadine (CLARITIN) 10 MG tablet Take 10 mg by mouth in the morning.   losartan (COZAAR) 25 MG tablet Take 1 tablet (25 mg total) by mouth daily.   LUMIGAN 0.01 % SOLN 1 drop at bedtime.   metFORMIN (GLUCOPHAGE-XR) 500 MG 24 hr tablet Take 1,000 mg by mouth in the morning and at bedtime.   Multiple Vitamins-Minerals (MULTIVITAMIN ADULTS 50+) TABS Take 1 tablet by mouth in the morning.   pantoprazole (PROTONIX) 40 MG tablet TAKE ONE TABLET BY MOUTH EVERY OTHER DAY   Probiotic Product (PROBIOTIC DAILY PO) Take 1 capsule by mouth in the morning.   rosuvastatin (CRESTOR) 10 MG tablet Take 10 mg by mouth every evening.    sodium bicarbonate 650 MG tablet Take 0.5 tablets (325 mg total) by mouth 2 (two) times daily.   tamsulosin (FLOMAX) 0.4 MG CAPS capsule Take 1 capsule (0.4 mg total) by mouth at bedtime.   Vibegron (GEMTESA) 75 MG TABS Take 1 tablet (75 mg total) by mouth daily.   amLODipine (NORVASC) 5 MG tablet Take 1 tablet (5 mg total) by mouth daily.   clotrimazole (CLOTRIMAZOLE ANTI-FUNGAL) 1 % cream Apply 1 Application topically 2 (two) times daily. (Patient not taking: Reported on 11/30/2023)   No facility-administered encounter medications on file as of 11/30/2023.   Physical Exam: Blood pressure 130/81, pulse 79, height 6' (1.829 m), weight 208 lb 6.4 oz (94.5 kg), SpO2 94%. Gen:      No acute distress HEENT:  EOMI, sclera anicteric Neck:     No masses; no thyromegaly Lungs:    Bibasal crackles CV:         Regular rate and rhythm; no murmurs Abd:      + bowel sounds; soft, non-tender; no palpable masses, no distension Ext:    No edema; adequate peripheral perfusion Skin:      Warm and dry; no rash Neuro: alert and oriented x 3 Psych: normal mood and affect   Data Reviewed: CT scan 07/27/2008-mild basal atelectasis right upper lobe pulmonary embolism CT scan 11/16/17-mild  bronchiectasis, basal reticulation right greater than left.  Borderline right hilar lymphadenopathy.  Nodular liver contour possible cirrhosis High-resolution CT 01/26/18- patchy ground glass attenuation, mild bronchiectasis, septal thickening with no basal gradient.  No honeycombing. Indeterminate for UIP.  Hepatic steatosis, aortic atherosclerosis, left main and left anterior coronary artery disease. High-resolution CT 05/04/2018-mild basal predominant lung fibrosis stable from prior exam. High-resolution CT 10/18/2018-stable interstitial lung disease CTA 09/26/2019-focal filling defect within a subsegmental branch of the right lower lobe pulmonary artery.  Equivocal for PE High-res CT 08/21/2020-stable interstitial lung disease High-res CT 10/17/2021-stable pattern of pulmonary fibrosis. High-res CT 02/17/2023-stable fibrotic and lung disease, probable UIP I have reviewed the images personally.  Barium swallow 05/31/13- mild impairment of esophageal motility, stricture at GE junction with obstruction of barium tablet.  Labs Connective tissue serologies 11/23/17-ANA, ACE, CCP, rheumatoid factor all negative Hypersenitivity panel-negative  PFTs  12/23/17 FVC 2.30 (54%), FEV1 1.97 [66%], F/F 86, TLC 64%, DLCO 42%  05/18/2018 FVC 2.32 [55%), FEV1 1.91 [65%], F/F 82, DLCO 43%  10/20/2018 FVC 2.30 [55%], FEV1 1.96 [6 6%], F/F 85, DLCO 46%  10/24/2019 FVC 2.29 [5%], FEV1 2.03 [70%],/89, TLC 3.63 [49%], DLCO 13.57 [54%],  10/01/2020 FVC 2.28 [56%], FEV1 1.99 [69%], TLC 4.75 (64%), DLCO 15.21 [61%]  09/30/2021 FVC 2.09 [54%], FEV1 1.97 [70%], F/F90, TLC 3.86 [52%], DLCO 13.91 [56%]  06/15/2023 FVC 1.91 [4 7%], FEV1 1.68 [60%], F/F88, TLC 4.09 [55%], DLCO 10.23 [41%] Moderate restriction, diffusion defect   6-minute walk 03/03/18- 338 m 6-minute walk 05/20/18- 288 m 6-minute walk 10/27/18- 363 m  Cardiac RHC 04/20/18 RA: 9 mmHg RV: 29/7 mmHg PA: 29/12 mmHg, mean PAP 19 mmHg PCWP: 9  mmHg LVEDP: 14 mmHg  CO: 4.8 L/min CI: 2.3 L/min/m2  Sleep Received copy of sleep study from [facility], 08/01/2011 BiPAP titration.  Titrated to IPAP 17, EPAP 13.  Severe PLM's noted.  Consider treatment for restless leg syndrome.  Overnight oximetry 02/17/2020 overnight oximetry on room air on BiPAP (IPAP 17 cm, EPAP setting 13 cm)-duration of sleep 8 hours and 31 minutes, time spent below 88% 8 seconds.  Did not qualify for nocturnal oxygen use  Compliance report 06/29/2021 On BiPAP 16/12 97% compliant, residual AHI 4.5   Assessment:  Follow-up for bronchiectasis, interstitial lung disease. He could have interstitial lung disease from occupational, recreational exposure to cotton dust, wood dust, possible asbestos.  Other considerations include pneumonitis from ulcerative colitis or from infliximab, meslamine therapy or chronic aspiration from esophageal stricture.  Connective tissue serologies are negative. There is no clear evidence that he has IPF.   Cardiac work-up as noted above with no evidence of significant coronary artery disease or pulmonary hypertension. Cardio pulmonary exercise test shows dyspnea likely from combination of cardiac, pulmonary factors. For definite diagnosis of ILD and treatment he will need an open lung biopsy. We had an extensive discussion about risks benefits of the biopsy versus empirically treating him with anti-fibrotics Discussed the option of Envisia test through bronchoscope but he would like to defer all biopsies  Overall he is remained stable over the past couple of years and hence we are continuing conservative management Follow-up CT in 1 year  Pulmonary embolism Findings are equivocal with nonspecific filling defects. He is currently on Eliquis Would continue anticoagulation given his underlying lung issues and significant dyspnea  OSA Continues on BiPAP 16/12 He has received his new BiPAP machine.  Download reviewed today and  shows 100% compliance and effectiveness.   Plan/Recommendations: - Continue BiPAP -Follow-up with CT pneumonia  Chilton Greathouse MD [facility] 11/30/2023, 9:54 AM  CC: [facility]

## 2023-11-30 NOTE — Patient Instructions (Signed)
VISIT SUMMARY:  You came in today for a follow-up visit regarding your interstitial lung disease and the new BiPAP machine you have been using. You reported that your breathing is stable and that you are using the BiPAP machine effectively for about eight and a half hours each day. You also mentioned that you stopped using the [facility] inhaler as it did not seem to help.  YOUR PLAN:  -OBSTRUCTIVE SLEEP APNEA: Obstructive sleep apnea is a condition where your breathing repeatedly stops and starts during sleep. Your condition is stable with the current BiPAP settings, and you are using the machine for about 8.5 hours each day. Continue with the current BiPAP settings.  -INTERSTITIAL LUNG DISEASE: Interstitial lung disease refers to a group of disorders that cause scarring of the lungs, making it difficult to breathe. Your condition is stable, and you have discontinued the Breztri inhaler as it was not effective. A CT scan of your chest is scheduled for January 2026 to monitor the progression of the disease.  INSTRUCTIONS:  Please continue using your BiPAP machine as instructed. Discontinue the [employer] inhaler. A CT scan of your chest is scheduled for January 2026 to monitor your interstitial lung disease. If you have any new symptoms or concerns, please contact our office.

## 2023-12-09 ENCOUNTER — Ambulatory Visit: Payer: Medicare Other | Admitting: Pulmonary Disease

## 2023-12-17 DIAGNOSIS — R030 Elevated blood-pressure reading, without diagnosis of hypertension: Secondary | ICD-10-CM | POA: Diagnosis not present

## 2023-12-17 DIAGNOSIS — E1165 Type 2 diabetes mellitus with hyperglycemia: Secondary | ICD-10-CM | POA: Diagnosis not present

## 2023-12-21 DIAGNOSIS — E1122 Type 2 diabetes mellitus with diabetic chronic kidney disease: Secondary | ICD-10-CM | POA: Diagnosis not present

## 2023-12-23 DIAGNOSIS — L110 Acquired keratosis follicularis: Secondary | ICD-10-CM | POA: Diagnosis not present

## 2023-12-23 DIAGNOSIS — E1140 Type 2 diabetes mellitus with diabetic neuropathy, unspecified: Secondary | ICD-10-CM | POA: Diagnosis not present

## 2023-12-23 DIAGNOSIS — B351 Tinea unguium: Secondary | ICD-10-CM | POA: Diagnosis not present

## 2023-12-28 ENCOUNTER — Telehealth: Payer: Self-pay | Admitting: Pulmonary Disease

## 2023-12-28 DIAGNOSIS — H401211 Low-tension glaucoma, right eye, mild stage: Secondary | ICD-10-CM | POA: Diagnosis not present

## 2023-12-28 NOTE — Telephone Encounter (Signed)
Patient would like to schedule CT scan. Patient phone number is [phone] and [phone].

## 2023-12-28 NOTE — Telephone Encounter (Signed)
Pt is due for a CT in Janurary 2026. We will schedule pt a month beforehand. I'll call and inform the pt that we will be scheduling him later in the year.

## 2024-01-04 ENCOUNTER — Telehealth: Payer: Self-pay | Admitting: Pulmonary Disease

## 2024-01-04 NOTE — Telephone Encounter (Signed)
 The order we have shows its due 11/2024

## 2024-01-04 NOTE — Telephone Encounter (Signed)
 I called and spoke with the pt's spouse, Willaim Sheng, ok per DPR  She states that she is calling to make sure pt did not need CT Chest this year  She thought it was due in 2025  According to the last note with Dr Isaiah Serge it's  due Jan 2026  Spouse asking to verify this to be sure  Routing to Dr Isaiah Serge  Please advise, thanks!

## 2024-01-04 NOTE — Telephone Encounter (Signed)
 Billie wife states patient needs a CT scan for 2025. Billie phone number is [phone].

## 2024-01-05 NOTE — Telephone Encounter (Signed)
 I called and discussed with patient.  His ILD has remained stable with no new symptoms.  We will go ahead with CT in Jan of 2026 as planned.  Nothing further needed

## 2024-01-12 ENCOUNTER — Ambulatory Visit: Payer: Medicare Other | Admitting: Pulmonary Disease

## 2024-01-13 ENCOUNTER — Ambulatory Visit (HOSPITAL_COMMUNITY): Admission: RE | Admit: 2024-01-13 | Source: Ambulatory Visit

## 2024-01-19 ENCOUNTER — Ambulatory Visit (HOSPITAL_COMMUNITY)
Admission: RE | Admit: 2024-01-19 | Discharge: 2024-01-19 | Disposition: A | Discharge status: Home / Self Care | Source: Ambulatory Visit | Attending: Urology | Admitting: Urology

## 2024-01-19 DIAGNOSIS — N200 Calculus of kidney: Secondary | ICD-10-CM | POA: Insufficient documentation

## 2024-02-01 ENCOUNTER — Telehealth: Payer: Self-pay | Admitting: Urology

## 2024-02-01 ENCOUNTER — Telehealth: Payer: Self-pay

## 2024-02-01 NOTE — Telephone Encounter (Signed)
 Results sent to MD to review. Radiology read was just completed today.

## 2024-02-01 NOTE — Telephone Encounter (Signed)
 In pain and got results from ultrasound and it shows a problem. Wants to be seen asap

## 2024-02-01 NOTE — Telephone Encounter (Signed)
 Tried return call to Tiffany from internal Radiology to confirmed call report. VM left on tiffany voice message to confirmed Radiology results received.

## 2024-02-03 ENCOUNTER — Telehealth: Payer: Self-pay

## 2024-02-03 ENCOUNTER — Ambulatory Visit: Admitting: Urology

## 2024-02-03 VITALS — BP 120/70 | HR 85

## 2024-02-03 DIAGNOSIS — N138 Other obstructive and reflux uropathy: Secondary | ICD-10-CM

## 2024-02-03 DIAGNOSIS — R3915 Urgency of urination: Secondary | ICD-10-CM

## 2024-02-03 DIAGNOSIS — N401 Enlarged prostate with lower urinary tract symptoms: Secondary | ICD-10-CM

## 2024-02-03 DIAGNOSIS — N200 Calculus of kidney: Secondary | ICD-10-CM | POA: Diagnosis not present

## 2024-02-03 DIAGNOSIS — R350 Frequency of micturition: Secondary | ICD-10-CM

## 2024-02-03 MED ORDER — TAMSULOSIN HCL 0.4 MG PO CAPS
0.4000 mg | ORAL_CAPSULE | Freq: Two times a day (BID) | ORAL | 11 refills | Status: DC
Start: 2024-02-03 — End: 2024-08-03

## 2024-02-03 MED ORDER — MIRABEGRON ER 25 MG PO TB24: 25.0000 mg | ORAL_TABLET | Freq: Every day | ORAL | 0 refills | Status: DC

## 2024-02-03 NOTE — Telephone Encounter (Signed)
 Pt wife called to get [place] results wife states her husband is in pain on the right side right above the kidney Pt wife states she doesn't believe theres any kidney stones Pt wife wants [place] report faxed to MD Reuel Boom fax number:  [phone] advised Pt MD McKenzie will be notified of husband pain appt rescheduled may appt for today(3/26)

## 2024-02-03 NOTE — Telephone Encounter (Signed)
 Patient seen in office and questions were addressed by MD.
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
## 2024-02-04 LAB — URINALYSIS, ROUTINE W REFLEX MICROSCOPIC
Bilirubin, UA: NEGATIVE
Leukocytes,UA: NEGATIVE
Nitrite, UA: NEGATIVE
RBC, UA: NEGATIVE
Specific Gravity, UA: 1.02 (ref 1.005–1.030)
Urobilinogen, Ur: 1 mg/dL (ref 0.2–1.0)
pH, UA: 6 (ref 5.0–7.5)

## 2024-02-07 ENCOUNTER — Encounter: Payer: Self-pay | Admitting: Urology

## 2024-02-07 NOTE — Patient Instructions (Signed)

## 2024-02-16 ENCOUNTER — Other Ambulatory Visit: Payer: Medicare Other

## 2024-02-23 ENCOUNTER — Other Ambulatory Visit: Payer: Self-pay | Admitting: Urology

## 2024-02-24 ENCOUNTER — Ambulatory Visit: Payer: Medicare Other | Admitting: Urology

## 2024-02-27 DIAGNOSIS — J019 Acute sinusitis, unspecified: Secondary | ICD-10-CM | POA: Diagnosis not present

## 2024-02-27 DIAGNOSIS — R051 Acute cough: Secondary | ICD-10-CM | POA: Diagnosis not present

## 2024-02-27 DIAGNOSIS — J40 Bronchitis, not specified as acute or chronic: Secondary | ICD-10-CM | POA: Diagnosis not present

## 2024-03-02 DIAGNOSIS — B351 Tinea unguium: Secondary | ICD-10-CM | POA: Diagnosis not present

## 2024-03-02 DIAGNOSIS — E1140 Type 2 diabetes mellitus with diabetic neuropathy, unspecified: Secondary | ICD-10-CM | POA: Diagnosis not present

## 2024-03-02 DIAGNOSIS — L110 Acquired keratosis follicularis: Secondary | ICD-10-CM | POA: Diagnosis not present

## 2024-03-21 DIAGNOSIS — Z1329 Encounter for screening for other suspected endocrine disorder: Secondary | ICD-10-CM | POA: Diagnosis not present

## 2024-03-21 DIAGNOSIS — I10 Essential (primary) hypertension: Secondary | ICD-10-CM | POA: Diagnosis not present

## 2024-03-21 DIAGNOSIS — E1122 Type 2 diabetes mellitus with diabetic chronic kidney disease: Secondary | ICD-10-CM | POA: Diagnosis not present

## 2024-03-21 DIAGNOSIS — S40862A Insect bite (nonvenomous) of left upper arm, initial encounter: Secondary | ICD-10-CM | POA: Diagnosis not present

## 2024-03-21 DIAGNOSIS — E7849 Other hyperlipidemia: Secondary | ICD-10-CM | POA: Diagnosis not present

## 2024-03-21 DIAGNOSIS — R5383 Other fatigue: Secondary | ICD-10-CM | POA: Diagnosis not present

## 2024-03-21 DIAGNOSIS — D519 Vitamin B12 deficiency anemia, unspecified: Secondary | ICD-10-CM | POA: Diagnosis not present

## 2024-03-22 ENCOUNTER — Other Ambulatory Visit: Payer: Self-pay | Admitting: Urology

## 2024-03-25 ENCOUNTER — Ambulatory Visit: Payer: Medicare Other | Admitting: Urology

## 2024-03-28 DIAGNOSIS — K7581 Nonalcoholic steatohepatitis (NASH): Secondary | ICD-10-CM | POA: Diagnosis not present

## 2024-03-28 DIAGNOSIS — J849 Interstitial pulmonary disease, unspecified: Secondary | ICD-10-CM | POA: Diagnosis not present

## 2024-03-28 DIAGNOSIS — K5100 Ulcerative (chronic) pancolitis without complications: Secondary | ICD-10-CM | POA: Diagnosis not present

## 2024-03-28 DIAGNOSIS — E1122 Type 2 diabetes mellitus with diabetic chronic kidney disease: Secondary | ICD-10-CM | POA: Diagnosis not present

## 2024-03-29 ENCOUNTER — Encounter (INDEPENDENT_AMBULATORY_CARE_PROVIDER_SITE_OTHER): Payer: Self-pay | Admitting: *Deleted

## 2024-04-01 ENCOUNTER — Telehealth: Payer: Self-pay

## 2024-04-01 DIAGNOSIS — N401 Enlarged prostate with lower urinary tract symptoms: Secondary | ICD-10-CM

## 2024-04-01 NOTE — Telephone Encounter (Signed)
 Return call to patient. Patient state's he was on Gemtesa  75 mg  and Dr. Claretta Croft change him to Mybetriq 25 mg which is not working as well as the Gemtesa  and wants an alternative. Patient is made aware a message will be sent to the MD and someone will reach out with Dr. Claretta Croft recommendation. Patient voiced understanding.

## 2024-04-02 ENCOUNTER — Other Ambulatory Visit: Payer: Self-pay

## 2024-04-02 ENCOUNTER — Encounter (HOSPITAL_COMMUNITY): Payer: Self-pay

## 2024-04-02 ENCOUNTER — Emergency Department (HOSPITAL_COMMUNITY)

## 2024-04-02 ENCOUNTER — Emergency Department (HOSPITAL_COMMUNITY)
Admission: EM | Admit: 2024-04-02 | Discharge: 2024-04-03 | Disposition: A | Discharge status: Home / Self Care | Attending: Emergency Medicine | Admitting: Emergency Medicine

## 2024-04-02 DIAGNOSIS — M546 Pain in thoracic spine: Secondary | ICD-10-CM | POA: Diagnosis not present

## 2024-04-02 DIAGNOSIS — R6889 Other general symptoms and signs: Secondary | ICD-10-CM | POA: Diagnosis not present

## 2024-04-02 DIAGNOSIS — W01198A Fall on same level from slipping, tripping and stumbling with subsequent striking against other object, initial encounter: Secondary | ICD-10-CM | POA: Insufficient documentation

## 2024-04-02 DIAGNOSIS — S300XXA Contusion of lower back and pelvis, initial encounter: Secondary | ICD-10-CM | POA: Insufficient documentation

## 2024-04-02 DIAGNOSIS — S3992XA Unspecified injury of lower back, initial encounter: Secondary | ICD-10-CM | POA: Diagnosis present

## 2024-04-02 DIAGNOSIS — S0990XA Unspecified injury of head, initial encounter: Secondary | ICD-10-CM | POA: Diagnosis not present

## 2024-04-02 DIAGNOSIS — M47814 Spondylosis without myelopathy or radiculopathy, thoracic region: Secondary | ICD-10-CM | POA: Diagnosis not present

## 2024-04-02 DIAGNOSIS — Z7901 Long term (current) use of anticoagulants: Secondary | ICD-10-CM | POA: Insufficient documentation

## 2024-04-02 DIAGNOSIS — W19XXXA Unspecified fall, initial encounter: Secondary | ICD-10-CM

## 2024-04-02 DIAGNOSIS — M545 Low back pain, unspecified: Secondary | ICD-10-CM | POA: Diagnosis not present

## 2024-04-02 DIAGNOSIS — Z743 Need for continuous supervision: Secondary | ICD-10-CM | POA: Diagnosis not present

## 2024-04-02 DIAGNOSIS — I6782 Cerebral ischemia: Secondary | ICD-10-CM | POA: Diagnosis not present

## 2024-04-02 DIAGNOSIS — M549 Dorsalgia, unspecified: Secondary | ICD-10-CM | POA: Diagnosis not present

## 2024-04-02 MED ORDER — HYDROCODONE-ACETAMINOPHEN 5-325 MG PO TABS
1.0000 | ORAL_TABLET | Freq: Once | ORAL | Status: AC
  Administered 2024-04-02: 1 via ORAL
  Filled 2024-04-02: qty 1

## 2024-04-02 NOTE — ED Notes (Signed)
 Patient transported to CT

## 2024-04-02 NOTE — ED Triage Notes (Addendum)
 Patient arrives with [facility] from home c/o fall; patient report falling this evening and "landing on his back." Patient denies any neck or head pain, denies LOC. Patient a&ox4; reports back pain is 7/10. Patient reports taking eliquis .  EMS vitals: 160/80 CBG 93

## 2024-04-02 NOTE — ED Provider Notes (Signed)
 [place] EMERGENCY DEPARTMENT AT [facility]  Provider Note  CSN: [id number] Arrival date & time: 04/02/24 2212  History Chief Complaint  Patient presents with   Manuel Atkins is a 89 y.o. male with multiple medical problems including prior PE on Eliquis  reports he lost his balance while trying to open a box at home earlier today and fell backwards landing on his back. He hit his head but did not have LOC. He has been having mid-and lower-back pain since then, worse with movement. He was unable to get up on his own and so wife called EMS. He uses a walker at baseline for balance problems.    Home Medications Prior to Admission medications   Medication Sig Start Date End Date Taking? Authorizing Provider  lidocaine  (LIDODERM ) 5 % Place 1 patch onto the skin daily. Remove & Discard patch within 12 hours or as directed by MD 04/03/24  Yes Charmayne Cooper, MD  ACCU-CHEK GUIDE test strip USE AS DIRECTED TO CHECK BLOOD SUGAR ONCE DAILY 04/01/23   [provider]  acetaminophen  (TYLENOL ) 500 MG tablet Take 1,000 mg by mouth every 6 (six) hours as needed for moderate pain or headache.    [provider]  ALPRAZolam  (XANAX ) 0.25 MG tablet Take 0.5 tablets (0.125 mg total) by mouth 3 (three) times daily as needed for anxiety. for anxiety 07/06/18   Ruby Corporal, MD  amLODipine  (NORVASC ) 5 MG tablet Take 1 tablet (5 mg total) by mouth daily. 11/02/21 06/25/23  Krishnan, Sendil K, MD  carvedilol  (COREG ) 3.125 MG tablet Take 1 tablet (3.125 mg total) by mouth 2 (two) times daily. 11/02/21 07/08/24  Krishnan, Sendil K, MD  cetirizine (ZYRTEC) 10 MG tablet Take 10 mg by mouth at bedtime.    [provider]  clotrimazole  (CLOTRIMAZOLE  ANTI-FUNGAL) 1 % cream Apply 1 Application topically 2 (two) times daily. Patient not taking: Reported on 11/30/2023 07/21/22   Summerlin, Julienne Annette, PA-C  ELIQUIS  5 MG TABS tablet TAKE 1 TABLET BY MOUTH TWICE DAILY  07/25/23   Mannam, Praveen, MD  Empagliflozin-metFORMIN  HCl (SYNJARDY PO) Take by mouth.    [provider]  finasteride  (PROSCAR ) 5 MG tablet Take 1 tablet (5 mg total) by mouth daily. 08/26/23   McKenzie, Arden Beck, MD  glipiZIDE  (GLUCOTROL  XL) 10 MG 24 hr tablet Take 10 mg by mouth in the morning. 09/09/21   [provider]  imipramine  (TOFRANIL ) 25 MG tablet Take 75 mg by mouth at bedtime.    [provider]  loratadine  (CLARITIN ) 10 MG tablet Take 10 mg by mouth in the morning.    [provider]  losartan  (COZAAR ) 25 MG tablet Take 1 tablet (25 mg total) by mouth daily. 11/02/21 07/08/24  Krishnan, Sendil K, MD  LUMIGAN 0.01 % SOLN 1 drop at bedtime. 04/15/23   [provider]  Multiple Vitamins-Minerals (MULTIVITAMIN ADULTS 50+) TABS Take 1 tablet by mouth in the morning.    [provider]  MYRBETRIQ  25 MG TB24 tablet TAKE 1 TABLET BY MOUTH ONCE DAILY 03/22/24   McKenzie, Arden Beck, MD  pantoprazole  (PROTONIX ) 40 MG tablet TAKE ONE TABLET BY MOUTH EVERY OTHER DAY 03/06/22   Ruby Corporal, MD  Probiotic Product (PROBIOTIC DAILY PO) Take 1 capsule by mouth in the morning.    [provider]  rosuvastatin  (CRESTOR ) 10 MG tablet Take 10 mg by mouth every evening.  11/17/18   [provider]  sodium bicarbonate  650 MG tablet Take 0.5 tablets (325 mg total) by mouth 2 (two) times daily. 08/26/23   McKenzie, Arden Beck, MD  tamsulosin  (FLOMAX ) 0.4 MG CAPS capsule Take 1 capsule (0.4 mg total) by mouth in the morning and at bedtime. 02/03/24   McKenzie, Arden Beck, MD  Vibegron  (GEMTESA ) 75 MG TABS Take 1 tablet (75 mg total) by mouth daily. 02/20/23   McKenzie, Arden Beck, MD     Allergies    Purixan  [mercaptopurine ]   Review of Systems   Review of Systems Please see HPI for pertinent positives and negatives  Physical Exam BP (!) 151/73 (BP Location: Right Arm)   Pulse 81   Temp (!) 97.4 F (36.3 C) (Oral)   Resp 17   Ht  6' (1.829 m)   Wt 94.3 kg   SpO2 94%   BMI 28.21 kg/m   Physical Exam Vitals and nursing note reviewed.  Constitutional:      Appearance: Normal appearance.  HENT:     Head: Normocephalic and atraumatic.     Nose: Nose normal.     Mouth/Throat:     Mouth: Mucous membranes are moist.  Eyes:     Extraocular Movements: Extraocular movements intact.     Conjunctiva/sclera: Conjunctivae normal.  Cardiovascular:     Rate and Rhythm: Normal rate.  Pulmonary:     Effort: Pulmonary effort is normal.     Breath sounds: Normal breath sounds.  Abdominal:     General: Abdomen is flat.     Palpations: Abdomen is soft.     Tenderness: There is no abdominal tenderness.  Musculoskeletal:        General: Tenderness (no focal tenderness of thoracic or lumbar back) present. No swelling. Normal range of motion.     Cervical back: Neck supple. No tenderness.  Skin:    General: Skin is warm and dry.  Neurological:     General: No focal deficit present.     Mental Status: He is alert.     Cranial Nerves: No cranial nerve deficit.     Sensory: No sensory deficit.     Motor: No weakness.  Psychiatric:        Mood and Affect: Mood normal.     ED Results / Procedures / Treatments   EKG None  Procedures Procedures  Medications Ordered in the ED Medications  lidocaine  (LIDODERM ) 5 % 1 patch (1 patch Transdermal Patch Applied 04/03/24 0057)  HYDROcodone -acetaminophen  (NORCO/VICODIN) 5-325 MG per tablet 1 tablet (1 tablet Oral Given 04/02/24 2353)    Initial Impression and Plan  Patient here with back pain after a fall. No focal tenderness on exam, but significant pain with movement. Normal neurologically. No external signs of head injury but given eliquis  use with check a CT. Send for xrays of the back. Pain medication for comfort.   ED Course   Clinical Course as of 04/03/24 1610  Sat Apr 02, 2024  2352 I personally viewed the images from radiology studies and agree with radiologist  interpretation: CT is neg for traumatic injury.  [CS]  Sun Apr 03, 2024  9604 I personally viewed the images from radiology studies and agree with radiologist interpretation: xrays neg for bony injury. Patient reports pain is improved. Now has a knot on his back. Will attempt to stand and if comfortable going home plan discharge with lidocaine  patches, heat, APAP for pain. Avoid medications that would worsen his unsteady gait such as opioids or muscle relaxers.  [CS]  Clinical Course User Index [CS] Charmayne Cooper, MD     MDM Rules/Calculators/A&P Medical Decision Making Problems Addressed: Acute low back pain without sciatica, unspecified back pain laterality: acute illness or injury Fall, initial encounter: acute illness or injury Lumbar contusion, initial encounter: acute illness or injury  Amount and/or Complexity of Data Reviewed Radiology: ordered and independent interpretation performed. Decision-making details documented in ED Course.  Risk Prescription drug management.     Final Clinical Impression(s) / ED Diagnoses Final diagnoses:  Fall, initial encounter  Lumbar contusion, initial encounter  Acute low back pain without sciatica, unspecified back pain laterality    Rx / DC Orders ED Discharge Orders          Ordered    lidocaine  (LIDODERM ) 5 %  Every 24 hours        04/03/24 0113             Charmayne Cooper, MD 04/03/24 [phone]

## 2024-04-03 MED ORDER — LIDOCAINE 5 % EX PTCH: 1.0000 | MEDICATED_PATCH | CUTANEOUS | 0 refills | Status: DC

## 2024-04-03 MED ORDER — LIDOCAINE 5 % EX PTCH
1.0000 | MEDICATED_PATCH | Freq: Once | CUTANEOUS | Status: DC
  Administered 2024-04-03: 1 via TRANSDERMAL
  Filled 2024-04-03: qty 1

## 2024-04-03 NOTE — ED Notes (Signed)
Patient was able to ambulate with minimal assistance.

## 2024-04-05 MED ORDER — TROSPIUM CHLORIDE ER 60 MG PO CP24: 60.0000 mg | ORAL_CAPSULE | Freq: Every day | ORAL | 3 refills | Status: DC

## 2024-04-05 NOTE — Telephone Encounter (Signed)
 Patient is made aware and voiced understanding "Sanctura  60mg  daily"

## 2024-04-07 ENCOUNTER — Telehealth: Payer: Self-pay

## 2024-04-07 NOTE — Telephone Encounter (Signed)
 Sam from [facility] called to see if a PA for Trospium Chloride was completed she was made aware that PA has not been completed as of yet

## 2024-04-07 NOTE — Telephone Encounter (Signed)
 Medication prior authorization request received.  Completed PA request through cover my meds for drug Trospium Chloride ER 60MG  KEY: [id number]  Approved: Pending

## 2024-04-07 NOTE — Telephone Encounter (Signed)
 Copied from CRM [id number]. Topic: Clinical - Medical Advice >> Apr 07, 2024  8:16 AM Margarette Shawl wrote: Reason for CRM:   Pt is contacting clinic in regards to his BiPap machine. He reports sleeping well at night; however, wakes up feeling not well rested. He is wondering if he needs to make adjustments to his BiPAP settings.  Requested call back  # [phone]

## 2024-04-08 NOTE — Telephone Encounter (Signed)
 Please make follow up visit with me or APP to review download of Bipap. Virtual visit is ok

## 2024-04-08 NOTE — Telephone Encounter (Signed)
 Lm for patient.

## 2024-04-09 ENCOUNTER — Other Ambulatory Visit: Payer: Self-pay

## 2024-04-09 ENCOUNTER — Emergency Department (HOSPITAL_COMMUNITY)

## 2024-04-09 ENCOUNTER — Encounter (HOSPITAL_COMMUNITY): Payer: Self-pay

## 2024-04-09 ENCOUNTER — Observation Stay (HOSPITAL_COMMUNITY)
Admission: EM | Admit: 2024-04-09 | Discharge: 2024-04-15 | Disposition: A | Discharge status: Skilled Nursing Facility | Attending: Internal Medicine | Admitting: Internal Medicine

## 2024-04-09 DIAGNOSIS — S0990XA Unspecified injury of head, initial encounter: Secondary | ICD-10-CM | POA: Diagnosis not present

## 2024-04-09 DIAGNOSIS — G4733 Obstructive sleep apnea (adult) (pediatric): Secondary | ICD-10-CM | POA: Insufficient documentation

## 2024-04-09 DIAGNOSIS — Z7984 Long term (current) use of oral hypoglycemic drugs: Secondary | ICD-10-CM | POA: Insufficient documentation

## 2024-04-09 DIAGNOSIS — E66811 Obesity, class 1: Secondary | ICD-10-CM | POA: Insufficient documentation

## 2024-04-09 DIAGNOSIS — K5190 Ulcerative colitis, unspecified, without complications: Secondary | ICD-10-CM | POA: Insufficient documentation

## 2024-04-09 DIAGNOSIS — G319 Degenerative disease of nervous system, unspecified: Secondary | ICD-10-CM | POA: Diagnosis not present

## 2024-04-09 DIAGNOSIS — E1169 Type 2 diabetes mellitus with other specified complication: Secondary | ICD-10-CM | POA: Insufficient documentation

## 2024-04-09 DIAGNOSIS — K5730 Diverticulosis of large intestine without perforation or abscess without bleeding: Secondary | ICD-10-CM | POA: Diagnosis not present

## 2024-04-09 DIAGNOSIS — Z87891 Personal history of nicotine dependence: Secondary | ICD-10-CM | POA: Diagnosis not present

## 2024-04-09 DIAGNOSIS — E782 Mixed hyperlipidemia: Secondary | ICD-10-CM | POA: Insufficient documentation

## 2024-04-09 DIAGNOSIS — D649 Anemia, unspecified: Secondary | ICD-10-CM | POA: Diagnosis not present

## 2024-04-09 DIAGNOSIS — Z86711 Personal history of pulmonary embolism: Secondary | ICD-10-CM | POA: Diagnosis not present

## 2024-04-09 DIAGNOSIS — E1122 Type 2 diabetes mellitus with diabetic chronic kidney disease: Secondary | ICD-10-CM | POA: Diagnosis not present

## 2024-04-09 DIAGNOSIS — Z950 Presence of cardiac pacemaker: Secondary | ICD-10-CM | POA: Insufficient documentation

## 2024-04-09 DIAGNOSIS — I4891 Unspecified atrial fibrillation: Secondary | ICD-10-CM | POA: Insufficient documentation

## 2024-04-09 DIAGNOSIS — Z6830 Body mass index (BMI) 30.0-30.9, adult: Secondary | ICD-10-CM | POA: Insufficient documentation

## 2024-04-09 DIAGNOSIS — S22000A Wedge compression fracture of unspecified thoracic vertebra, initial encounter for closed fracture: Secondary | ICD-10-CM | POA: Diagnosis present

## 2024-04-09 DIAGNOSIS — R29890 Loss of height: Secondary | ICD-10-CM | POA: Diagnosis not present

## 2024-04-09 DIAGNOSIS — I499 Cardiac arrhythmia, unspecified: Secondary | ICD-10-CM | POA: Diagnosis not present

## 2024-04-09 DIAGNOSIS — I672 Cerebral atherosclerosis: Secondary | ICD-10-CM | POA: Diagnosis not present

## 2024-04-09 DIAGNOSIS — Z7901 Long term (current) use of anticoagulants: Secondary | ICD-10-CM | POA: Diagnosis not present

## 2024-04-09 DIAGNOSIS — W19XXXA Unspecified fall, initial encounter: Secondary | ICD-10-CM | POA: Insufficient documentation

## 2024-04-09 DIAGNOSIS — Z9181 History of falling: Secondary | ICD-10-CM | POA: Diagnosis not present

## 2024-04-09 DIAGNOSIS — M47812 Spondylosis without myelopathy or radiculopathy, cervical region: Secondary | ICD-10-CM | POA: Diagnosis not present

## 2024-04-09 DIAGNOSIS — I6782 Cerebral ischemia: Secondary | ICD-10-CM | POA: Diagnosis not present

## 2024-04-09 DIAGNOSIS — R531 Weakness: Secondary | ICD-10-CM | POA: Diagnosis not present

## 2024-04-09 DIAGNOSIS — I700 Atherosclerosis of aorta: Secondary | ICD-10-CM | POA: Diagnosis not present

## 2024-04-09 DIAGNOSIS — J849 Interstitial pulmonary disease, unspecified: Secondary | ICD-10-CM | POA: Insufficient documentation

## 2024-04-09 DIAGNOSIS — M4856XA Collapsed vertebra, not elsewhere classified, lumbar region, initial encounter for fracture: Secondary | ICD-10-CM | POA: Diagnosis not present

## 2024-04-09 DIAGNOSIS — S32010A Wedge compression fracture of first lumbar vertebra, initial encounter for closed fracture: Principal | ICD-10-CM

## 2024-04-09 DIAGNOSIS — I129 Hypertensive chronic kidney disease with stage 1 through stage 4 chronic kidney disease, or unspecified chronic kidney disease: Secondary | ICD-10-CM | POA: Insufficient documentation

## 2024-04-09 DIAGNOSIS — M545 Low back pain, unspecified: Secondary | ICD-10-CM | POA: Diagnosis present

## 2024-04-09 DIAGNOSIS — I10 Essential (primary) hypertension: Secondary | ICD-10-CM | POA: Diagnosis present

## 2024-04-09 DIAGNOSIS — N183 Chronic kidney disease, stage 3 unspecified: Secondary | ICD-10-CM | POA: Insufficient documentation

## 2024-04-09 DIAGNOSIS — N200 Calculus of kidney: Secondary | ICD-10-CM | POA: Diagnosis not present

## 2024-04-09 DIAGNOSIS — R0689 Other abnormalities of breathing: Secondary | ICD-10-CM | POA: Diagnosis not present

## 2024-04-09 DIAGNOSIS — N1831 Chronic kidney disease, stage 3a: Secondary | ICD-10-CM | POA: Diagnosis not present

## 2024-04-09 DIAGNOSIS — Z79899 Other long term (current) drug therapy: Secondary | ICD-10-CM | POA: Diagnosis not present

## 2024-04-09 DIAGNOSIS — J479 Bronchiectasis, uncomplicated: Secondary | ICD-10-CM | POA: Diagnosis not present

## 2024-04-09 LAB — CBC WITH DIFFERENTIAL/PLATELET
Abs Immature Granulocytes: 0.06 10*3/uL (ref 0.00–0.07)
Basophils Absolute: 0.1 10*3/uL (ref 0.0–0.1)
Basophils Relative: 1 %
Eosinophils Absolute: 0 10*3/uL (ref 0.0–0.5)
Eosinophils Relative: 0 %
HCT: 33.9 % — ABNORMAL LOW (ref 39.0–52.0)
Hemoglobin: 11.3 g/dL — ABNORMAL LOW (ref 13.0–17.0)
Immature Granulocytes: 1 %
Lymphocytes Relative: 14 %
Lymphs Abs: 1.2 10*3/uL (ref 0.7–4.0)
MCH: 31.9 pg (ref 26.0–34.0)
MCHC: 33.3 g/dL (ref 30.0–36.0)
MCV: 95.8 fL (ref 80.0–100.0)
Monocytes Absolute: 1 10*3/uL (ref 0.1–1.0)
Monocytes Relative: 12 %
Neutro Abs: 6.3 10*3/uL (ref 1.7–7.7)
Neutrophils Relative %: 72 %
Platelets: 238 10*3/uL (ref 150–400)
RBC: 3.54 MIL/uL — ABNORMAL LOW (ref 4.22–5.81)
RDW: 13.9 % (ref 11.5–15.5)
WBC: 8.7 10*3/uL (ref 4.0–10.5)
nRBC: 0 % (ref 0.0–0.2)

## 2024-04-09 LAB — BASIC METABOLIC PANEL WITH GFR
Anion gap: 12 (ref 5–15)
BUN: 34 mg/dL — ABNORMAL HIGH (ref 8–23)
CO2: 21 mmol/L — ABNORMAL LOW (ref 22–32)
Calcium: 9.1 mg/dL (ref 8.9–10.3)
Chloride: 105 mmol/L (ref 98–111)
Creatinine, Ser: 1.59 mg/dL — ABNORMAL HIGH (ref 0.61–1.24)
GFR, Estimated: 41 mL/min — ABNORMAL LOW (ref >60)
Glucose, Bld: 214 mg/dL — ABNORMAL HIGH (ref 70–99)
Potassium: 4.7 mmol/L (ref 3.5–5.1)
Sodium: 138 mmol/L (ref 135–145)

## 2024-04-09 LAB — PROTIME-INR
INR: 1.6 — ABNORMAL HIGH (ref 0.8–1.2)
Prothrombin Time: 19.4 s — ABNORMAL HIGH (ref 11.4–15.2)

## 2024-04-09 LAB — TYPE AND SCREEN
ABO/RH(D): A NEG
Antibody Screen: NEGATIVE

## 2024-04-09 LAB — APTT: aPTT: 33 s (ref 24–36)

## 2024-04-09 MED ORDER — CARVEDILOL 3.125 MG PO TABS
3.1250 mg | ORAL_TABLET | Freq: Two times a day (BID) | ORAL | Status: DC
  Administered 2024-04-10 – 2024-04-15 (×11): 3.125 mg via ORAL
  Filled 2024-04-09 (×12): qty 1

## 2024-04-09 MED ORDER — IMIPRAMINE HCL 25 MG PO TABS
75.0000 mg | ORAL_TABLET | Freq: Every day | ORAL | Status: DC
  Administered 2024-04-10: 75 mg via ORAL
  Filled 2024-04-09 (×4): qty 3

## 2024-04-09 MED ORDER — OXYCODONE-ACETAMINOPHEN 5-325 MG PO TABS
1.0000 | ORAL_TABLET | Freq: Four times a day (QID) | ORAL | Status: AC | PRN
  Administered 2024-04-10: 1 via ORAL
  Filled 2024-04-09: qty 1

## 2024-04-09 MED ORDER — EMPAGLIFLOZIN 10 MG PO TABS
10.0000 mg | ORAL_TABLET | Freq: Every day | ORAL | Status: DC
  Administered 2024-04-10 – 2024-04-15 (×6): 10 mg via ORAL
  Filled 2024-04-09 (×6): qty 1

## 2024-04-09 MED ORDER — ROSUVASTATIN CALCIUM 5 MG PO TABS
10.0000 mg | ORAL_TABLET | Freq: Every evening | ORAL | Status: DC
  Administered 2024-04-10 – 2024-04-14 (×5): 10 mg via ORAL
  Filled 2024-04-09 (×5): qty 2

## 2024-04-09 MED ORDER — OXYCODONE-ACETAMINOPHEN 5-325 MG PO TABS
1.0000 | ORAL_TABLET | Freq: Once | ORAL | Status: AC
  Administered 2024-04-09: 1 via ORAL
  Filled 2024-04-09: qty 1

## 2024-04-09 MED ORDER — LOSARTAN POTASSIUM 25 MG PO TABS
25.0000 mg | ORAL_TABLET | Freq: Every day | ORAL | Status: DC
  Administered 2024-04-10 – 2024-04-15 (×6): 25 mg via ORAL
  Filled 2024-04-09 (×6): qty 1

## 2024-04-09 MED ORDER — GLIPIZIDE ER 10 MG PO TB24
10.0000 mg | ORAL_TABLET | Freq: Every day | ORAL | Status: DC
  Administered 2024-04-10 – 2024-04-15 (×6): 10 mg via ORAL
  Filled 2024-04-09 (×6): qty 1

## 2024-04-09 MED ORDER — TAMSULOSIN HCL 0.4 MG PO CAPS
0.4000 mg | ORAL_CAPSULE | Freq: Every day | ORAL | Status: DC
  Administered 2024-04-10 – 2024-04-15 (×6): 0.4 mg via ORAL
  Filled 2024-04-09 (×6): qty 1

## 2024-04-09 MED ORDER — LIDOCAINE 5 % EX PTCH
1.0000 | MEDICATED_PATCH | CUTANEOUS | Status: DC
  Administered 2024-04-10 – 2024-04-14 (×6): 1 via TRANSDERMAL
  Filled 2024-04-09 (×6): qty 1

## 2024-04-09 MED ORDER — ACETAMINOPHEN 500 MG PO TABS
1000.0000 mg | ORAL_TABLET | Freq: Four times a day (QID) | ORAL | Status: DC | PRN
  Administered 2024-04-10 (×2): 1000 mg via ORAL
  Filled 2024-04-09 (×2): qty 2

## 2024-04-09 MED ORDER — ALPRAZOLAM 0.25 MG PO TABS
0.1250 mg | ORAL_TABLET | Freq: Three times a day (TID) | ORAL | Status: DC | PRN
  Administered 2024-04-11: 0.125 mg via ORAL
  Filled 2024-04-09: qty 1

## 2024-04-09 MED ORDER — PANTOPRAZOLE SODIUM 20 MG PO TBEC
20.0000 mg | DELAYED_RELEASE_TABLET | Freq: Every day | ORAL | Status: DC
  Administered 2024-04-10 – 2024-04-15 (×6): 20 mg via ORAL
  Filled 2024-04-09 (×6): qty 1

## 2024-04-09 MED ORDER — AMLODIPINE BESYLATE 5 MG PO TABS
5.0000 mg | ORAL_TABLET | Freq: Every day | ORAL | Status: DC
  Administered 2024-04-10 – 2024-04-15 (×6): 5 mg via ORAL
  Filled 2024-04-09 (×6): qty 1

## 2024-04-09 MED ORDER — EMPAGLIFLOZIN-METFORMIN HCL 5-500 MG PO TABS: ORAL_TABLET | Freq: Every day | ORAL | Status: DC

## 2024-04-09 MED ORDER — APIXABAN 5 MG PO TABS
5.0000 mg | ORAL_TABLET | Freq: Two times a day (BID) | ORAL | Status: DC
  Administered 2024-04-10 – 2024-04-11 (×3): 5 mg via ORAL
  Filled 2024-04-09 (×3): qty 1

## 2024-04-09 MED ORDER — FINASTERIDE 5 MG PO TABS
5.0000 mg | ORAL_TABLET | Freq: Every day | ORAL | Status: DC
  Administered 2024-04-10 – 2024-04-15 (×6): 5 mg via ORAL
  Filled 2024-04-09 (×6): qty 1

## 2024-04-09 MED ORDER — SODIUM BICARBONATE 650 MG PO TABS
325.0000 mg | ORAL_TABLET | Freq: Two times a day (BID) | ORAL | Status: DC
  Administered 2024-04-10 – 2024-04-15 (×11): 325 mg via ORAL
  Filled 2024-04-09 (×12): qty 1

## 2024-04-09 MED ORDER — METFORMIN HCL 500 MG PO TABS
500.0000 mg | ORAL_TABLET | Freq: Two times a day (BID) | ORAL | Status: DC
  Administered 2024-04-10 – 2024-04-15 (×11): 500 mg via ORAL
  Filled 2024-04-09 (×11): qty 1

## 2024-04-09 NOTE — ED Provider Triage Note (Signed)
 Emergency Medicine Provider Triage Evaluation Note  ATTILIO ZEITLER , a 89 y.o. male  was evaluated in triage.  Pt complains of mechanical fall tonight, unwitnessed.  He does not know whether he hit his head.  Does not recall losing consciousness.  He is on Eliquis .  He primarily complains of lower back pain, no radicular symptoms.  Had a similar fall last week.  Feels that the back pain is new.  Review of Systems  Positive:  Negative:   Physical Exam  BP (!) 146/75 (BP Location: Left Arm)   Pulse 88   Temp 97.7 F (36.5 C)   Resp 16   Ht 6' (1.829 m)   Wt 94.3 kg   SpO2 95%   BMI 28.21 kg/m  Gen:   Awake, no distress   Resp:  Normal effort  MSK:   Moves extremities without difficulty  Other:  Superficial abrasion to left forehead, appears old and healing well.  Superficial abrasion to left elbow without any tenderness.  Medical Decision Making  Medically screening exam initiated at 8:41 PM.  Appropriate orders placed.  Burgess Caroline was informed that the remainder of the evaluation will be completed by another provider, this initial triage assessment does not replace that evaluation, and the importance of remaining in the ED until their evaluation is complete.     Felicie Horning, PA-C 04/09/24 2043

## 2024-04-09 NOTE — ED Provider Notes (Signed)
 [place] EMERGENCY DEPARTMENT AT [facility] Provider Note   CSN: [id number] Arrival date & time: 04/09/24  2026     History  Chief Complaint  Patient presents with   Manuel Atkins is a 89 y.o. male.  With a history of atrial fibrillation on Eliquis , type 2 diabetes and recent falls who presents to the ED after another fall.  Patient was at home tonight when he fell backwards after getting up from the toilet.  Fell backward onto [employer] floor.  Now complaining of lower back pain.  Denies head trauma loss of consciousness.  Last dose of Eliquis  was earlier tonight.  Another recent fall 1 week ago for which she was seen at [facility] and diagnosed with lumbar contusion after no traumatic findings on imaging.   Fall       Home Medications Prior to Admission medications   Medication Sig Start Date End Date Taking? Authorizing Provider  ACCU-CHEK GUIDE test strip USE AS DIRECTED TO CHECK BLOOD SUGAR ONCE DAILY 04/01/23   [provider]  acetaminophen  (TYLENOL ) 500 MG tablet Take 1,000 mg by mouth every 6 (six) hours as needed for moderate pain or headache.    [provider]  ALPRAZolam  (XANAX ) 0.25 MG tablet Take 0.5 tablets (0.125 mg total) by mouth 3 (three) times daily as needed for anxiety. for anxiety 07/06/18   Ruby Corporal, MD  amLODipine  (NORVASC ) 5 MG tablet Take 1 tablet (5 mg total) by mouth daily. 11/02/21 06/25/23  Krishnan, Sendil K, MD  carvedilol  (COREG ) 3.125 MG tablet Take 1 tablet (3.125 mg total) by mouth 2 (two) times daily. 11/02/21 07/08/24  Krishnan, Sendil K, MD  cetirizine (ZYRTEC) 10 MG tablet Take 10 mg by mouth at bedtime.    [provider]  clotrimazole  (CLOTRIMAZOLE  ANTI-FUNGAL) 1 % cream Apply 1 Application topically 2 (two) times daily. Patient not taking: Reported on 11/30/2023 07/21/22   Summerlin, Julienne Annette, PA-C  ELIQUIS  5 MG TABS tablet TAKE 1 TABLET BY MOUTH TWICE DAILY 07/25/23   Mannam,  Praveen, MD  Empagliflozin-metFORMIN  HCl (SYNJARDY PO) Take by mouth.    [provider]  finasteride  (PROSCAR ) 5 MG tablet Take 1 tablet (5 mg total) by mouth daily. 08/26/23   McKenzie, Arden Beck, MD  glipiZIDE  (GLUCOTROL  XL) 10 MG 24 hr tablet Take 10 mg by mouth in the morning. 09/09/21   [provider]  imipramine  (TOFRANIL ) 25 MG tablet Take 75 mg by mouth at bedtime.    [provider]  lidocaine  (LIDODERM ) 5 % Place 1 patch onto the skin daily. Remove & Discard patch within 12 hours or as directed by MD 04/03/24   Charmayne Cooper, MD  loratadine  (CLARITIN ) 10 MG tablet Take 10 mg by mouth in the morning.    [provider]  losartan  (COZAAR ) 25 MG tablet Take 1 tablet (25 mg total) by mouth daily. 11/02/21 07/08/24  Krishnan, Sendil K, MD  LUMIGAN 0.01 % SOLN 1 drop at bedtime. 04/15/23   [provider]  Multiple Vitamins-Minerals (MULTIVITAMIN ADULTS 50+) TABS Take 1 tablet by mouth in the morning.    [provider]  pantoprazole  (PROTONIX ) 40 MG tablet TAKE ONE TABLET BY MOUTH EVERY OTHER DAY 03/06/22   Ruby Corporal, MD  Probiotic Product (PROBIOTIC DAILY PO) Take 1 capsule by mouth in the morning.    [provider]  rosuvastatin  (CRESTOR ) 10 MG tablet Take 10 mg by mouth every evening.  11/17/18   [provider]  sodium bicarbonate  650 MG tablet Take 0.5 tablets (325 mg total) by mouth 2 (two) times daily. 08/26/23   McKenzie, Arden Beck, MD  tamsulosin  (FLOMAX ) 0.4 MG CAPS capsule Take 1 capsule (0.4 mg total) by mouth in the morning and at bedtime. 02/03/24   McKenzie, Arden Beck, MD  Trospium  Chloride 60 MG CP24 Take 1 capsule (60 mg total) by mouth daily. 04/05/24   McKenzie, Arden Beck, MD      Allergies    Purixan  [mercaptopurine ]    Review of Systems   Review of Systems  Physical Exam Updated Vital Signs BP (!) 146/75 (BP Location: Left Arm)   Pulse 88   Temp 97.7 F (36.5 C)   Resp 16   Ht 6'  (1.829 m)   Wt 94.3 kg   SpO2 95%   BMI 28.21 kg/m  Physical Exam Vitals and nursing note reviewed.  HENT:     Head: Normocephalic and atraumatic.  Eyes:     Pupils: Pupils are equal, round, and reactive to light.  Cardiovascular:     Rate and Rhythm: Normal rate and regular rhythm.  Pulmonary:     Effort: Pulmonary effort is normal.     Breath sounds: Normal breath sounds.  Abdominal:     Palpations: Abdomen is soft.     Tenderness: There is no abdominal tenderness.  Musculoskeletal:     Comments: Midline and paraspinal lumbar tenderness no step-off deformities No midline tenderness step-off deformity of thoracic spine No tender step-off deformity of cervical spine 5-5 motor strength bilateral upper and lower extremities Small skin tear over left elbow with no bony tenderness  Skin:    General: Skin is warm and dry.  Neurological:     Mental Status: He is alert.  Psychiatric:        Mood and Affect: Mood normal.     ED Results / Procedures / Treatments   Labs (all labs ordered are listed, but only abnormal results are displayed) Labs Reviewed  CBC WITH DIFFERENTIAL/PLATELET - Abnormal; Notable for the following components:      Result Value   RBC 3.54 (*)    Hemoglobin 11.3 (*)    HCT 33.9 (*)    All other components within normal limits  BASIC METABOLIC PANEL WITH GFR - Abnormal; Notable for the following components:   CO2 21 (*)    Glucose, Bld 214 (*)    BUN 34 (*)    Creatinine, Ser 1.59 (*)    GFR, Estimated 41 (*)    All other components within normal limits  PROTIME-INR - Abnormal; Notable for the following components:   Prothrombin Time 19.4 (*)    INR 1.6 (*)    All other components within normal limits  APTT  TYPE AND SCREEN  ABO/RH    EKG None  Radiology CT CHEST ABDOMEN PELVIS WO CONTRAST Result Date: 04/09/2024 CLINICAL DATA:  Trauma EXAM: CT CHEST, ABDOMEN AND PELVIS WITHOUT CONTRAST TECHNIQUE: Multidetector CT imaging of the chest,  abdomen and pelvis was performed following the standard protocol without IV contrast. RADIATION DOSE REDUCTION: This exam was performed according to the departmental dose-optimization program which includes automated exposure control, adjustment of the mA and/or kV according to patient size and/or use of iterative reconstruction technique. COMPARISON:  Chest CT 02/17/2023. CT renal stone 06/23/2022. Lumbar spine x-ray 04/02/2024. FINDINGS: CT CHEST FINDINGS Cardiovascular: The heart is mildly enlarged. There is no pericardial effusion. Left-sided pacemaker is present. There  are atherosclerotic calcifications of the aorta. The aorta is normal in size. Mediastinum/Nodes: No enlarged mediastinal, hilar, or axillary lymph nodes. Thyroid  gland, trachea, and esophagus demonstrate no significant findings. Lungs/Pleura: Bronchiectasis in the bilateral lower lobes appears unchanged. Peripheral interstitial opacities are scattered throughout both lungs, unchanged from the prior examination. There is no new focal lung infiltrate, pleural effusion or pneumothorax. Musculoskeletal: No chest wall mass or suspicious bone lesions identified. CT ABDOMEN PELVIS FINDINGS Hepatobiliary: No focal liver abnormality is seen. Status post cholecystectomy. No biliary dilatation. Pancreas: Unremarkable. No pancreatic ductal dilatation or surrounding inflammatory changes. Spleen: Normal in size without focal abnormality. Adrenals/Urinary Tract: There is a punctate nonobstructing calculus in the lower pole left kidney. Otherwise, the adrenal glands, kidneys and bladder are within normal limits. Stomach/Bowel: Stomach is within normal limits. Appendix appears normal. No evidence of bowel wall thickening, distention, or inflammatory changes. Colonic diverticulosis present. Vascular/Lymphatic: Aortic atherosclerosis. No enlarged abdominal or pelvic lymph nodes. Reproductive: Post PICC line is enlarged. Other: There is a small fat containing right  inguinal hernia. No ascites. Musculoskeletal: There is subcutaneous edema lateral to the right hip. There is mild compression fracture of the superior endplate of L1 which has progressed compared to 04/02/2024. There are healed left superior and inferior pubic rami fractures. Degenerative changes affect the spine and hips. IMPRESSION: 1. Subcutaneous edema lateral to the right hip compatible with soft tissue contusion. 2. Mild compression fracture of the superior endplate of L1 has progressed compared to 04/02/2024. Correlate for point tenderness. 3. No other acute posttraumatic sequelae in the chest, abdomen or pelvis. 4. Stable bronchiectasis and interstitial opacities in the lungs. 5. Nonobstructing left renal calculus. 6. Colonic diverticulosis. 7. Aortic atherosclerosis. Aortic Atherosclerosis (ICD10-I70.0). Electronically Signed   By: Tyron Gallon M.D.   On: 04/09/2024 22:46   CT Head Wo Contrast Result Date: 04/09/2024 EXAM: CT HEAD AND CERVICAL SPINE 04/09/2024 10:30:00 PM TECHNIQUE: CT of the head and cervical spine was performed without the administration of intravenous contrast. Multiplanar reformatted images are provided for review. Automated exposure control, iterative reconstruction, and/or weight based adjustment of the mA/kV was utilized to reduce the radiation dose to as low as reasonably achievable. COMPARISON: CT head dated 04/02/2024 and CT cervical spine dated 07/17/2021. CLINICAL HISTORY: FINDINGS: HEAD: BRAIN AND VENTRICLES: There is no acute intracranial hemorrhage, mass effect or midline shift. No abnormal extra-axial fluid collection. The gray-white differentiation is maintained without evidence of an acute infarct. There is no evidence of hydrocephalus. Global cortical atrophy. Subcortical and periventricular small vessel ischemic changes. ORBITS: The visualized portion of the orbits demonstrate no acute abnormality. SINUSES: The visualized paranasal sinuses and mastoid air cells  demonstrate no acute abnormality. SOFT TISSUES AND SKULL: No acute abnormality of the visualized skull or soft tissues. CERVICAL SPINE: BONES AND ALIGNMENT: There is no acute fracture or traumatic malalignment. DEGENERATIVE CHANGES: Mild degenerative changes of the mid / lower cervical spine. SOFT TISSUES: There is no prevertebral soft tissue swelling. VASCULATURE: Intracranial atherosclerosis. IMPRESSION: 1. No acute intracranial abnormality. 2. Mild degenerative changes of the mid/lower cervical spine. Electronically signed by: Zadie Herter MD 04/09/2024 10:40 PM EDT RP Workstation: [id number]   CT Cervical Spine Wo Contrast Result Date: 04/09/2024 EXAM: CT HEAD AND CERVICAL SPINE 04/09/2024 10:30:00 PM TECHNIQUE: CT of the head and cervical spine was performed without the administration of intravenous contrast. Multiplanar reformatted images are provided for review. Automated exposure control, iterative reconstruction, and/or weight based adjustment of the mA/kV was utilized to reduce the radiation  dose to as low as reasonably achievable. COMPARISON: CT head dated 04/02/2024 and CT cervical spine dated 07/17/2021. CLINICAL HISTORY: FINDINGS: HEAD: BRAIN AND VENTRICLES: There is no acute intracranial hemorrhage, mass effect or midline shift. No abnormal extra-axial fluid collection. The gray-white differentiation is maintained without evidence of an acute infarct. There is no evidence of hydrocephalus. Global cortical atrophy. Subcortical and periventricular small vessel ischemic changes. ORBITS: The visualized portion of the orbits demonstrate no acute abnormality. SINUSES: The visualized paranasal sinuses and mastoid air cells demonstrate no acute abnormality. SOFT TISSUES AND SKULL: No acute abnormality of the visualized skull or soft tissues. CERVICAL SPINE: BONES AND ALIGNMENT: There is no acute fracture or traumatic malalignment. DEGENERATIVE CHANGES: Mild degenerative changes of the mid / lower  cervical spine. SOFT TISSUES: There is no prevertebral soft tissue swelling. VASCULATURE: Intracranial atherosclerosis. IMPRESSION: 1. No acute intracranial abnormality. 2. Mild degenerative changes of the mid/lower cervical spine. Electronically signed by: Zadie Herter MD 04/09/2024 10:40 PM EDT RP Workstation: [id number]    Procedures Procedures    Medications Ordered in ED Medications  oxyCODONE-acetaminophen  (PERCOCET/ROXICET) 5-325 MG per tablet 1 tablet (has no administration in time range)  acetaminophen  (TYLENOL ) tablet 1,000 mg (has no administration in time range)  ALPRAZolam  (XANAX ) tablet 0.125 mg (has no administration in time range)  carvedilol  (COREG ) tablet 3.125 mg (has no administration in time range)  amLODipine  (NORVASC ) tablet 5 mg (has no administration in time range)  apixaban  (ELIQUIS ) tablet 5 mg (has no administration in time range)  Empagliflozin-metFORMIN  HCl 5-500 MG TABS (has no administration in time range)  finasteride  (PROSCAR ) tablet 5 mg (has no administration in time range)  glipiZIDE  (GLUCOTROL  XL) 24 hr tablet 10 mg (has no administration in time range)  imipramine  (TOFRANIL ) tablet 75 mg (has no administration in time range)  lidocaine  (LIDODERM ) 5 % 1 patch (has no administration in time range)  losartan  (COZAAR ) tablet 25 mg (has no administration in time range)  pantoprazole  (PROTONIX ) EC tablet 20 mg (has no administration in time range)  rosuvastatin  (CRESTOR ) tablet 10 mg (has no administration in time range)  sodium bicarbonate  tablet 325 mg (has no administration in time range)  tamsulosin  (FLOMAX ) capsule 0.4 mg (has no administration in time range)  oxyCODONE-acetaminophen  (PERCOCET/ROXICET) 5-325 MG per tablet 1 tablet (has no administration in time range)    ED Course/ Medical Decision Making/ A&P Clinical Course as of 04/09/24 2338  Sat Apr 09, 2024  2333 CT head C-spine unremarkable.  CT chest abdomen pelvis shows progression of  previous L1 compression fracture.  Labs close to patient's baseline.  Given concern for repeated falls at home and patient's safety, we will keep him here in the in the ED overnight for [facility] evaluation for likely rehab/SNF placement.  Ordered Percocet for pain and will order his home meds and diet here [MP]    Clinical Course User Index [MP] Sallyanne Creamer, DO                                 Medical Decision Making 89 year old male with history as above seen recently in any pain after mechanical fall at home.  No traumatic findings 1 week ago at that time.  Marvell Slider again while getting off the toilet today.  No head trauma loss consciousness.  Last dose of Eliquis  was tonight.  Some lower back pain.  Given that he is on Eliquis  will obtain CT pan  scan to look for any evidence of trauma.  Wife voiced concern for repeated falls at home and increased generalized weakness.  If medical workup is negative he may require TOC evaluation for potential rehab/SNF placement  Amount and/or Complexity of Data Reviewed Labs: ordered. Radiology: ordered.  Risk OTC drugs. Prescription drug management.           Final Clinical Impression(s) / ED Diagnoses Final diagnoses:  Compression fracture of L1 vertebra, initial encounter [facility])  Fall, initial encounter    Rx / DC Orders ED Discharge Orders     None         Sallyanne Creamer, DO 04/09/24 2338

## 2024-04-09 NOTE — ED Triage Notes (Signed)
 PER EMS: pt is from home with c/o fall tonight when he lost his balance and fell forward when getting off of the toilet. He fell onto the hardwood floor, no head injury, no LOC but he does take Eliquis . He has had multiple falls and was here on 5/24 for a fall as well. A&OX4. He arrives in a c-collar.  BP-124/82, HR-95, 96% RA

## 2024-04-09 NOTE — ED Notes (Signed)
 Dr. Ranelle Buys at the bedside. C-collar removed.

## 2024-04-10 LAB — CBG MONITORING, ED: Glucose-Capillary: 141 mg/dL — ABNORMAL HIGH (ref 70–99)

## 2024-04-10 LAB — ABO/RH: ABO/RH(D): A NEG

## 2024-04-10 MED ORDER — OXYCODONE HCL 5 MG PO TABS
5.0000 mg | ORAL_TABLET | Freq: Three times a day (TID) | ORAL | Status: DC | PRN
  Administered 2024-04-10 – 2024-04-11 (×4): 5 mg via ORAL
  Filled 2024-04-10 (×4): qty 1

## 2024-04-10 NOTE — Progress Notes (Signed)
 PT Cancellation Note  Patient Details Name: Manuel Atkins MRN: [id number] DOB: January 17, 1933   Cancelled Treatment:    Reason Eval/Treat Not Completed: Medical issues which prohibited therapy (Acute PT to hold as pt has L1 compression fx. Reached out to MD regarding precautions and if pt is pending neurosurgery consult. Acute PT to hold and and re-attempt in afternoon after clarification.)  Hollie Bartus W, PT, DPT Secure Chat Preferred  Rehab Office [phone]  Alissa April Adela Ades 04/10/2024, 8:44 AM

## 2024-04-10 NOTE — ED Notes (Signed)
Assisted pt with urinal

## 2024-04-10 NOTE — Progress Notes (Addendum)
 2:10pm: Patient has not received any bed offers yet.   CSW sent message to admissions at [facility] to request patient be reviewed to determine if a bed offer can be made.  9:45am: CSW spoke with patient and his wife at bedside to discuss PT recommendation for SNF. Patient and wife are both agreeable. Wife reports patient has a wheelchair, shower chair, and bedside commode at home for use. Wife states patient has never been to SNF before but is requesting placement at [facility] or [facility] due to location.  CSW will complete FL2 and fax patient's clinical information out for review to obtain bed offers.  8:40am: CSW received consult for possible SNF placement.   PT is signed in, CSW will wait for their recommendations prior to proceeding with discharge planning  Shepard Dicker, MSW, LCSW Transitions of Care  Clinical Social Worker II [phone]

## 2024-04-10 NOTE — ED Notes (Signed)
Pt linen and brief changed

## 2024-04-10 NOTE — Evaluation (Signed)
 Physical Therapy Evaluation Patient Details Name: Manuel Atkins MRN: [id number] DOB: 01-01-1933 Today's Date: 04/10/2024  History of Present Illness  89 y.o. male [facility] to [facility]/09/[facility] after losing balance when getting off the toilet and falling. No LOC or head injury. Stable L1 compression fx. Multiple prior falls with admit 5/24 for fall. PMHx: a-fib on eliquis , T2DM  Clinical Impression  Pt supine on stretcher upon arrival and agreeable to PT eval. PTA, pt was having increased difficulty getting out of bed and ambulating. Pt with at least 6 falls in the past 6 months and would use a RW to ambulate short distances. In today's session pt was limited by back pain and fear of falling. Pt was able to roll with use of bed rails and MinA. Pt declined further transfer today due to high pain levels. Recommending post-acute rehab <3hrs to work towards independence with mobility. Pt would benefit from acute skilled PT with current functional limitations listed below (see PT Problem List). Acute PT to follow.         If plan is discharge home, recommend the following: A lot of help with walking and/or transfers;A lot of help with bathing/dressing/bathroom;Assistance with cooking/housework;Help with stairs or ramp for entrance;Assist for transportation   Can travel by private vehicle   No    Equipment Recommendations None recommended by PT     Functional Status Assessment Patient has had a recent decline in their functional status and demonstrates the ability to make significant improvements in function in a reasonable and predictable amount of time.     Precautions / Restrictions Precautions Precautions: Fall;Back Precaution Booklet Issued: No Precaution/Restrictions Comments: stable L1 compression fx, no brace per MD. Back precautions for comfort Restrictions Weight Bearing Restrictions Per Provider Order: No      Mobility  Bed Mobility Overal bed mobility: Needs Assistance Bed Mobility:  Rolling Rolling: Min assist, Used rails    General bed mobility comments: MinA with use of rails and B knees bent. Increased time and effort. Declined further OOB mobility 2/2 pain    Transfers    General transfer comment: pt declined       Balance Overall balance assessment: History of Falls        Pertinent Vitals/Pain Pain Assessment Pain Assessment: Faces Faces Pain Scale: Hurts whole lot Pain Location: back Pain Descriptors / Indicators: Aching, Discomfort Pain Intervention(s): Limited activity within patient's tolerance, Monitored during session, Premedicated before session, Repositioned    Home Living Family/patient expects to be discharged to:: Private residence Living Arrangements: Spouse/significant other Available Help at Discharge: Family;Available 24 hours/day (unable to help physically) Type of Home: House Home Access: Level entry  Home Layout: One level Home Equipment: Agricultural consultant (2 wheels);Wheelchair - manual;BSC/3in1;Grab bars - tub/shower;Grab bars - toilet;Shower seat - built in (gait belt, urinal) Additional Comments: 1 step into kitchen    Prior Function Prior Level of Function : History of Falls (last six months);Needs assist      Mobility Comments: About 6 prior falls, reports losing his balance. Has been needing increased assistance for mobility. Short distances with RW ADLs Comments: Ind with dressing. Assist to dry off when bathing. Assist for iADLs     Extremity/Trunk Assessment   Upper Extremity Assessment Upper Extremity Assessment: Defer to OT evaluation    Lower Extremity Assessment Lower Extremity Assessment: Generalized weakness (Limited L ankle ROM, prior ankle surgery)    Cervical / Trunk Assessment Cervical / Trunk Assessment: Normal  Communication   Communication Communication: Impaired  Factors Affecting Communication: Hearing impaired    Cognition Arousal: Alert Behavior During Therapy: WFL for tasks  assessed/performed   PT - Cognitive impairments: No apparent impairments  Following commands: Intact       Cueing Cueing Techniques: Verbal cues     General Comments General comments (skin integrity, edema, etc.): Wife present and supportive    Exercises General Exercises - Lower Extremity Ankle Circles/Pumps: AROM, Both, 5 reps, Supine Heel Slides: AROM, Both, 5 reps, Supine Hip ABduction/ADduction: AROM, Both, 5 reps, Supine Straight Leg Raises: AROM, Both, 5 reps, Supine Other Exercises Other Exercises: TA activation, x5 reps with 3 sec hold   Assessment/Plan    PT Assessment Patient needs continued PT services  PT Problem List Decreased strength;Decreased range of motion;Decreased balance;Decreased activity tolerance;Decreased mobility       PT Treatment Interventions DME instruction;Gait training;Stair training;Functional mobility training;Therapeutic activities;Therapeutic exercise;Balance training;Neuromuscular re-education;Patient/family education    PT Goals (Current goals can be found in the Care Plan section)  Acute Rehab PT Goals Patient Stated Goal: to get stronger and prevent future falls PT Goal Formulation: With patient/family Time For Goal Achievement: 04/24/24 Potential to Achieve Goals: Good    Frequency Min 2X/week        AM-PAC PT "6 Clicks" Mobility  Outcome Measure Help needed turning from your back to your side while in a flat bed without using bedrails?: A Lot Help needed moving from lying on your back to sitting on the side of a flat bed without using bedrails?: A Lot Help needed moving to and from a bed to a chair (including a wheelchair)?: Total Help needed standing up from a chair using your arms (e.g., wheelchair or bedside chair)?: Total Help needed to walk in hospital room?: Total Help needed climbing 3-5 steps with a railing? : Total 6 Click Score: 8    End of Session   Activity Tolerance: Patient limited by pain Patient left: in  bed;with call bell/phone within reach;with family/visitor present Nurse Communication: Mobility status PT Visit Diagnosis: Unsteadiness on feet (R26.81);Other abnormalities of gait and mobility (R26.89);Muscle weakness (generalized) (M62.81);History of falling (Z91.81)    Time: 1610-9604 PT Time Calculation (min) (ACUTE ONLY): 21 min   Charges:   PT Evaluation $PT Eval Low Complexity: 1 Low   PT General Charges $$ ACUTE PT VISIT: 1 Visit        Orysia Blas, PT, DPT Secure Chat Preferred  Rehab Office [phone]   Alissa April Adela Ades 04/10/2024, 9:47 AM

## 2024-04-10 NOTE — ED Notes (Signed)
 Pain patch given to pt when transferred to room 39 and pt cleaned and diaper changed. Pt is resting comfortable on bed wife at the bedside.

## 2024-04-10 NOTE — NC FL2 (Signed)
 [place]  MEDICAID FL2 LEVEL OF CARE FORM     IDENTIFICATION  Patient Name: Manuel Atkins Birthdate: 12/03/1932 Sex: male Admission Date (Current Location): 04/09/2024  [facility] and [place] Number:  Producer, television/film/video and Address:  [facility], [address] [place], [place] [address]      Provider Number: [id number]  Attending Physician Name and Address:  System, Provider Not In  Relative Name and Phone Number:       Current Level of Care: Hospital Recommended Level of Care: Skilled Nursing Facility Prior Approval Number:    Date Approved/Denied:   PASRR Number: [id number]  Discharge Plan: SNF    Current Diagnoses: Patient Active Problem List   Diagnosis Date Noted   Pacemaker 02/13/2022   AV block 10/31/2021   Aspiration pneumonia (HCC) 10/31/2021   Hypertensive urgency 10/31/2021   Bradycardia 10/30/2021   Healthcare maintenance 10/01/2020   History of pulmonary embolus (PE) 09/26/2019   GERD (gastroesophageal reflux disease) 07/19/2019   OSA (obstructive sleep apnea) 06/23/2019   Exertional chest pain 04/17/2018   ILD (interstitial lung disease) (HCC) 01/15/2018   Hoarseness 01/15/2018   Dyspnea 01/15/2018   Esophageal stricture 12/11/2017   Dysphagia 05/24/2013   Hypocalcemia 11/15/2012   Numbness 11/15/2012   Overweight 11/10/2012   Thrombocytopenia (HCC) 11/10/2012   Anemia 11/10/2012   Intractable diarrhea 11/08/2012   Ulcerative colitis (HCC) 11/08/2012   CMV colitis (HCC) 11/08/2012   Dehydration 11/08/2012   Fever and chills 09/27/2012   Type 2 diabetes mellitus with hyperlipidemia (HCC) 08/20/2011   Mixed hyperlipidemia    Essential hypertension, benign    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   Abnormal breath sounds 08/18/2011    Orientation RESPIRATION BLADDER Height & Weight     Self, Time, Situation, Place  Normal Continent Weight: 208 lb (94.3 kg) Height:  6' (182.9 cm)  BEHAVIORAL  SYMPTOMS/MOOD NEUROLOGICAL BOWEL NUTRITION STATUS      Continent Diet (Regular diet)  AMBULATORY STATUS COMMUNICATION OF NEEDS Skin   Extensive Assist Verbally Normal                       Personal Care Assistance Level of Assistance  Feeding, Bathing, Dressing Bathing Assistance: Maximum assistance Feeding assistance: Limited assistance Dressing Assistance: Maximum assistance     Functional Limitations Info  Hearing, Sight, Speech Sight Info: Adequate Hearing Info: Adequate Speech Info: Adequate    SPECIAL CARE FACTORS FREQUENCY  OT (By licensed OT), PT (By licensed PT)     PT Frequency: 5x weekly OT Frequency: 5x weekly            Contractures Contractures Info: Not present    Additional Factors Info  Code Status, Allergies Code Status Info: Full Code Allergies Info: Purixan            Current Medications (04/10/2024):  This is the current hospital active medication list Current Facility-Administered Medications  Medication Dose Route Frequency Provider Last Rate Last Admin   acetaminophen  (TYLENOL ) tablet 1,000 mg  1,000 mg Oral Q6H PRN Rafael Bun A, DO       ALPRAZolam  (XANAX ) tablet 0.125 mg  0.125 mg Oral TID PRN Rafael Bun A, DO       amLODipine  (NORVASC ) tablet 5 mg  5 mg Oral Daily Penna, Michael A, DO   5 mg at 04/10/24 5621   apixaban  (ELIQUIS ) tablet 5 mg  5 mg Oral BID Penna, Michael A, DO   5  mg at 04/10/24 0851   carvedilol  (COREG ) tablet 3.125 mg  3.125 mg Oral BID Penna, Michael A, DO   3.125 mg at 04/10/24 0850   empagliflozin (JARDIANCE) tablet 10 mg  10 mg Oral Q breakfast Penna, Michael A, DO   10 mg at 04/10/24 7829   And   metFORMIN  (GLUCOPHAGE ) tablet 500 mg  500 mg Oral BID WC Penna, Michael A, DO   500 mg at 04/10/24 5621   finasteride  (PROSCAR ) tablet 5 mg  5 mg Oral Daily Sallyanne Creamer, DO       glipiZIDE  (GLUCOTROL  XL) 24 hr tablet 10 mg  10 mg Oral Q breakfast Rafael Bun A, DO   10 mg at 04/10/24 3086   imipramine   (TOFRANIL ) tablet 75 mg  75 mg Oral QHS Rafael Bun A, DO       lidocaine  (LIDODERM ) 5 % 1 patch  1 patch Transdermal Q24H Penna, Michael A, DO   1 patch at 04/10/24 0021   losartan  (COZAAR ) tablet 25 mg  25 mg Oral Daily Penna, Michael A, DO   25 mg at 04/10/24 5784   oxyCODONE (Oxy IR/ROXICODONE) immediate release tablet 5 mg  5 mg Oral Q8H PRN Arvilla Birmingham, MD       pantoprazole  (PROTONIX ) EC tablet 20 mg  20 mg Oral Daily Penna, Michael A, DO       rosuvastatin  (CRESTOR ) tablet 10 mg  10 mg Oral QPM Penna, Michael A, DO       sodium bicarbonate  tablet 325 mg  325 mg Oral BID Penna, Michael A, DO   325 mg at 04/10/24 6962   tamsulosin  (FLOMAX ) capsule 0.4 mg  0.4 mg Oral Daily Penna, Michael A, DO   0.4 mg at 04/10/24 9528   Current Outpatient Medications  Medication Sig Dispense Refill   ACCU-CHEK GUIDE test strip USE AS DIRECTED TO CHECK BLOOD SUGAR ONCE DAILY     acetaminophen  (TYLENOL ) 500 MG tablet Take 1,000 mg by mouth every 6 (six) hours as needed for moderate pain or headache.     ALPRAZolam  (XANAX ) 0.25 MG tablet Take 0.5 tablets (0.125 mg total) by mouth 3 (three) times daily as needed for anxiety. for anxiety 135 tablet 1   amLODipine  (NORVASC ) 5 MG tablet Take 1 tablet (5 mg total) by mouth daily. 30 tablet 11   carvedilol  (COREG ) 3.125 MG tablet Take 1 tablet (3.125 mg total) by mouth 2 (two) times daily. 60 tablet 2   cetirizine (ZYRTEC) 10 MG tablet Take 10 mg by mouth at bedtime.     clotrimazole  (CLOTRIMAZOLE  ANTI-FUNGAL) 1 % cream Apply 1 Application topically 2 (two) times daily. (Patient not taking: Reported on 11/30/2023) 30 g 0   ELIQUIS  5 MG TABS tablet TAKE 1 TABLET BY MOUTH TWICE DAILY 180 tablet 1   Empagliflozin-metFORMIN  HCl (SYNJARDY PO) Take by mouth.     finasteride  (PROSCAR ) 5 MG tablet Take 1 tablet (5 mg total) by mouth daily. 90 tablet 3   glipiZIDE  (GLUCOTROL  XL) 10 MG 24 hr tablet Take 10 mg by mouth in the morning.     imipramine  (TOFRANIL ) 25 MG  tablet Take 75 mg by mouth at bedtime.  0   lidocaine  (LIDODERM ) 5 % Place 1 patch onto the skin daily. Remove & Discard patch within 12 hours or as directed by MD 30 patch 0   loratadine  (CLARITIN ) 10 MG tablet Take 10 mg by mouth in the morning.     losartan  (COZAAR ) 25  MG tablet Take 1 tablet (25 mg total) by mouth daily. 30 tablet 2   LUMIGAN 0.01 % SOLN 1 drop at bedtime.     Multiple Vitamins-Minerals (MULTIVITAMIN ADULTS 50+) TABS Take 1 tablet by mouth in the morning.     pantoprazole  (PROTONIX ) 40 MG tablet TAKE ONE TABLET BY MOUTH EVERY OTHER DAY 45 tablet 0   Probiotic Product (PROBIOTIC DAILY PO) Take 1 capsule by mouth in the morning.     rosuvastatin  (CRESTOR ) 10 MG tablet Take 10 mg by mouth every evening.      sodium bicarbonate  650 MG tablet Take 0.5 tablets (325 mg total) by mouth 2 (two) times daily. 12 tablet 11   tamsulosin  (FLOMAX ) 0.4 MG CAPS capsule Take 1 capsule (0.4 mg total) by mouth in the morning and at bedtime. 60 capsule 11   Trospium  Chloride 60 MG CP24 Take 1 capsule (60 mg total) by mouth daily. 30 capsule 3     Discharge Medications: Please see discharge summary for a list of discharge medications.  Relevant Imaging Results:  Relevant Lab Results:   Additional Information    Dexter Forest, LCSW

## 2024-04-11 ENCOUNTER — Other Ambulatory Visit: Payer: Self-pay

## 2024-04-11 ENCOUNTER — Encounter (HOSPITAL_COMMUNITY): Payer: Self-pay | Admitting: Family Medicine

## 2024-04-11 ENCOUNTER — Observation Stay (HOSPITAL_COMMUNITY)

## 2024-04-11 ENCOUNTER — Other Ambulatory Visit (HOSPITAL_COMMUNITY)

## 2024-04-11 DIAGNOSIS — M549 Dorsalgia, unspecified: Secondary | ICD-10-CM | POA: Diagnosis not present

## 2024-04-11 DIAGNOSIS — M47814 Spondylosis without myelopathy or radiculopathy, thoracic region: Secondary | ICD-10-CM | POA: Diagnosis not present

## 2024-04-11 DIAGNOSIS — Z79899 Other long term (current) drug therapy: Secondary | ICD-10-CM | POA: Diagnosis not present

## 2024-04-11 DIAGNOSIS — M5126 Other intervertebral disc displacement, lumbar region: Secondary | ICD-10-CM | POA: Diagnosis not present

## 2024-04-11 DIAGNOSIS — S22000A Wedge compression fracture of unspecified thoracic vertebra, initial encounter for closed fracture: Secondary | ICD-10-CM | POA: Diagnosis not present

## 2024-04-11 DIAGNOSIS — N183 Chronic kidney disease, stage 3 unspecified: Secondary | ICD-10-CM | POA: Insufficient documentation

## 2024-04-11 DIAGNOSIS — S0990XA Unspecified injury of head, initial encounter: Secondary | ICD-10-CM | POA: Diagnosis not present

## 2024-04-11 DIAGNOSIS — R29890 Loss of height: Secondary | ICD-10-CM | POA: Diagnosis not present

## 2024-04-11 DIAGNOSIS — D649 Anemia, unspecified: Secondary | ICD-10-CM | POA: Diagnosis not present

## 2024-04-11 DIAGNOSIS — E1122 Type 2 diabetes mellitus with diabetic chronic kidney disease: Secondary | ICD-10-CM | POA: Diagnosis not present

## 2024-04-11 DIAGNOSIS — E1169 Type 2 diabetes mellitus with other specified complication: Secondary | ICD-10-CM | POA: Diagnosis not present

## 2024-04-11 DIAGNOSIS — K5190 Ulcerative colitis, unspecified, without complications: Secondary | ICD-10-CM | POA: Diagnosis not present

## 2024-04-11 DIAGNOSIS — Z7984 Long term (current) use of oral hypoglycemic drugs: Secondary | ICD-10-CM | POA: Diagnosis not present

## 2024-04-11 DIAGNOSIS — Z86711 Personal history of pulmonary embolism: Secondary | ICD-10-CM | POA: Diagnosis not present

## 2024-04-11 DIAGNOSIS — J849 Interstitial pulmonary disease, unspecified: Secondary | ICD-10-CM | POA: Diagnosis not present

## 2024-04-11 DIAGNOSIS — Z7901 Long term (current) use of anticoagulants: Secondary | ICD-10-CM | POA: Diagnosis not present

## 2024-04-11 DIAGNOSIS — Z87891 Personal history of nicotine dependence: Secondary | ICD-10-CM | POA: Diagnosis not present

## 2024-04-11 DIAGNOSIS — I129 Hypertensive chronic kidney disease with stage 1 through stage 4 chronic kidney disease, or unspecified chronic kidney disease: Secondary | ICD-10-CM | POA: Diagnosis not present

## 2024-04-11 DIAGNOSIS — M5134 Other intervertebral disc degeneration, thoracic region: Secondary | ICD-10-CM | POA: Diagnosis not present

## 2024-04-11 DIAGNOSIS — M47816 Spondylosis without myelopathy or radiculopathy, lumbar region: Secondary | ICD-10-CM | POA: Diagnosis not present

## 2024-04-11 DIAGNOSIS — Z950 Presence of cardiac pacemaker: Secondary | ICD-10-CM | POA: Diagnosis not present

## 2024-04-11 DIAGNOSIS — N1831 Chronic kidney disease, stage 3a: Secondary | ICD-10-CM | POA: Diagnosis not present

## 2024-04-11 DIAGNOSIS — R609 Edema, unspecified: Secondary | ICD-10-CM | POA: Diagnosis not present

## 2024-04-11 DIAGNOSIS — I4891 Unspecified atrial fibrillation: Secondary | ICD-10-CM | POA: Diagnosis not present

## 2024-04-11 DIAGNOSIS — E782 Mixed hyperlipidemia: Secondary | ICD-10-CM | POA: Diagnosis not present

## 2024-04-11 DIAGNOSIS — Z9181 History of falling: Secondary | ICD-10-CM | POA: Diagnosis not present

## 2024-04-11 DIAGNOSIS — M4856XA Collapsed vertebra, not elsewhere classified, lumbar region, initial encounter for fracture: Secondary | ICD-10-CM | POA: Diagnosis not present

## 2024-04-11 DIAGNOSIS — G4733 Obstructive sleep apnea (adult) (pediatric): Secondary | ICD-10-CM | POA: Diagnosis not present

## 2024-04-11 LAB — GLUCOSE, CAPILLARY
Glucose-Capillary: 240 mg/dL — ABNORMAL HIGH (ref 70–99)
Glucose-Capillary: 281 mg/dL — ABNORMAL HIGH (ref 70–99)

## 2024-04-11 MED ORDER — INSULIN ASPART 100 UNIT/ML IJ SOLN
0.0000 [IU] | Freq: Every day | INTRAMUSCULAR | Status: DC
  Administered 2024-04-11: 3 [IU] via SUBCUTANEOUS
  Administered 2024-04-13: 2 [IU] via SUBCUTANEOUS

## 2024-04-11 MED ORDER — GADOBUTROL 1 MMOL/ML IV SOLN
9.4000 mL | Freq: Once | INTRAVENOUS | Status: AC | PRN
  Administered 2024-04-11: 9.4 mL via INTRAVENOUS

## 2024-04-11 MED ORDER — ONDANSETRON HCL 4 MG PO TABS: 4.0000 mg | ORAL_TABLET | Freq: Four times a day (QID) | ORAL | Status: DC | PRN

## 2024-04-11 MED ORDER — OXYCODONE HCL 5 MG PO TABS
5.0000 mg | ORAL_TABLET | ORAL | Status: DC | PRN
  Administered 2024-04-11 – 2024-04-15 (×10): 5 mg via ORAL
  Filled 2024-04-11 (×10): qty 1

## 2024-04-11 MED ORDER — POLYETHYLENE GLYCOL 3350 17 G PO PACK
17.0000 g | PACK | Freq: Every day | ORAL | Status: DC
  Administered 2024-04-11 – 2024-04-15 (×5): 17 g via ORAL
  Filled 2024-04-11 (×5): qty 1

## 2024-04-11 MED ORDER — INSULIN ASPART 100 UNIT/ML IJ SOLN
0.0000 [IU] | Freq: Three times a day (TID) | INTRAMUSCULAR | Status: DC
  Administered 2024-04-11: 3 [IU] via SUBCUTANEOUS
  Administered 2024-04-12: 2 [IU] via SUBCUTANEOUS
  Administered 2024-04-12: 3 [IU] via SUBCUTANEOUS

## 2024-04-11 MED ORDER — ONDANSETRON HCL 4 MG/2ML IJ SOLN: 4.0000 mg | Freq: Four times a day (QID) | INTRAMUSCULAR | Status: DC | PRN

## 2024-04-11 MED ORDER — HYDRALAZINE HCL 25 MG PO TABS: 25.0000 mg | ORAL_TABLET | Freq: Three times a day (TID) | ORAL | Status: DC | PRN

## 2024-04-11 MED ORDER — IMIPRAMINE HCL 25 MG PO TABS
75.0000 mg | ORAL_TABLET | Freq: Every day | ORAL | Status: DC
  Filled 2024-04-11: qty 8
  Filled 2024-04-11 (×2): qty 3

## 2024-04-11 MED ORDER — IMIPRAMINE HCL 10 MG PO TABS: 75.0000 mg | ORAL_TABLET | Freq: Every day | ORAL | Status: DC

## 2024-04-11 MED ORDER — ACETAMINOPHEN 500 MG PO TABS
1000.0000 mg | ORAL_TABLET | Freq: Three times a day (TID) | ORAL | Status: DC
  Administered 2024-04-11 – 2024-04-15 (×12): 1000 mg via ORAL
  Filled 2024-04-11 (×12): qty 2

## 2024-04-11 MED ORDER — IMIPRAMINE PAMOATE 75 MG PO CAPS
75.0000 mg | ORAL_CAPSULE | Freq: Every day | ORAL | Status: DC
  Administered 2024-04-11 – 2024-04-14 (×4): 75 mg via ORAL
  Filled 2024-04-11 (×5): qty 1

## 2024-04-11 NOTE — Assessment & Plan Note (Signed)
 Mild, not on disease modifying treatment now on home oxygen 

## 2024-04-11 NOTE — Assessment & Plan Note (Signed)
 Continue Crestor

## 2024-04-11 NOTE — Assessment & Plan Note (Signed)
-   per MRI L spine on 04/11/24: "Acute/subacute compression fracture of L1 with approximately 30% height loss centrally. No retropulsion." - Eliquis  resumed post procedure - Appreciate IR evaluation.  Successfully underwent kyphoplasty on 04/14/2024 - discharge to [facility]

## 2024-04-11 NOTE — Progress Notes (Signed)
 MRI completed, notified [facility] of patient admission to them.  Patient tolerated MRI, increased pain at final of MRI. HR remained stable throughout the MRI

## 2024-04-11 NOTE — ED Notes (Signed)
 Introduced self to patient, MRI tech interrogating pacer, HR currently 87.  Once in MRI HR will be 100.  Patient doing well, with no complaints at this time.

## 2024-04-11 NOTE — Assessment & Plan Note (Signed)
 Blood pressure elevated due to pain - Continue amlodipine , carvedilol , losartan  - As needed hydralazine 

## 2024-04-11 NOTE — Hospital Course (Addendum)
 [name] is an 89 y.o. male with PMH HTN, ILD not on home O2, OSA on BiPAP, DM, UC no longer on disease specific treatment, CKD IIIa, hx esophageal stricture, fatty liver, hx PE in 2020, still on Eliquis  who presented after a fall at home. He underwent imaging on admission for back pain.  MRI T-spine showed T6 nondisplaced fracture and MRI L-spine showed acute/subacute compression fracture of L1 with approximately 30% height loss centrally. IR was consulted for consideration of kyphoplasty.

## 2024-04-11 NOTE — Progress Notes (Addendum)
 9:15am: MD states patient needs medical admission for pain control.  CSW updated patient's wife and Destiny at [facility].  9:09am: Patient's insurance authorization has been approved, #[id number], next review date 04/13/24.  CSW spoke with patient's wife who states she is agreeable to discharge plan.  CSW informed Dr. Inga Manges of approval and need of AVS to be finalized by 12pm.  8:54am: CSW received return call from Destiny at [facility] who states she has offered patient a bed.  CSW will initiate insurance authorization.  8:45am: CSW attempted to reach Destiny at [facility] without success - another voicemail was left requesting a return call.  7:46am: CSW attempted to reach admissions at [facility] via phone without success - a voicemail was left requesting a return call.  Shepard Dicker, MSW, LCSW Transitions of Care  Clinical Social Worker II [phone]

## 2024-04-11 NOTE — Discharge Planning (Signed)
 Licensed Clinical Social Worker is seeking post-discharge placement for this patient at the following level of care: skilled nursing facility

## 2024-04-11 NOTE — Assessment & Plan Note (Addendum)
 History of segmental PE many years ago.  Pulmonology have kept him on Eliquis  due to his dyspnea and age. - okay to resume Eliquis  s/p kypoplasty

## 2024-04-11 NOTE — ED Notes (Signed)
 Pt c/o anxiety and pain when this nurse was at the bed side. Meds will be given per emar

## 2024-04-11 NOTE — Assessment & Plan Note (Signed)
 Hemoglobin 11.3, stable from baseline

## 2024-04-11 NOTE — Assessment & Plan Note (Signed)
 Creatinine stable relative to baseline at 1.6

## 2024-04-11 NOTE — Assessment & Plan Note (Addendum)
 No longer on infliximab  as an outpatient.

## 2024-04-11 NOTE — ED Provider Notes (Signed)
 Emergency Medicine Observation Re-evaluation Note  TYGE SOMERS is a 89 y.o. male, seen on rounds today.  Pt initially presented to the ED for complaints of Fall Currently, the patient is resting comfortably.  Physical Exam  BP (!) 159/75   Pulse 81   Temp 98.1 F (36.7 C) (Oral)   Resp 20   Ht 6' (1.829 m)   Wt 94.3 kg   SpO2 99%   BMI 28.21 kg/m  Physical Exam General: nad Cardiac: good peripheral perfusion Lungs: bilateral chest rise Psych: resting comfortably  Musc: Pulse motor and sensation intact to bilateral lower extremities.  Reflexes are 2+ and equal.  No clonus.  Negative straight leg raise test.  ED Course / MDM  EKG:   I have reviewed the labs performed to date as well as medications administered while in observation.  Recent changes in the last 24 hours include None.  Plan  Current plan is for rehab placement though the patient has a new compression fracture and uncontrolled pain.  I discussed this with neurosurgery, Marlana Silvan PA-C working with Dr. Gwendlyn Lemmings recommends lumbar sacral corset that admission for pain control.  Also thought reasonable to get an MRI to assess for epidural hematoma with progressive back pain on anticoagulation.    Albertus Hughs, DO 04/11/24 1640

## 2024-04-11 NOTE — Progress Notes (Signed)
 Orthopedic Tech Progress Note Patient Details:  Manuel Atkins [birth year] [id number]  Ortho Devices Type of Ortho Device: Lumbar corsett Ortho Device/Splint Location: BACK Ortho Device/Splint Interventions: Ordered, Adjustment   Post Interventions Patient Tolerated: Well Instructions Provided: Care of device  Kermitt Pedlar 04/11/2024, 6:52 PM
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
## 2024-04-11 NOTE — Progress Notes (Incomplete)
  Device system confirmed to be MRI conditional, with implant date > 6 weeks ago, and no evidence of abandoned or epicardial leads in review of most recent CXR  Device last cleared by EP Provider: Mertha Abrahams on 04/11/24  Clearance is good through for 1 year as long as parameters remain stable at time of check. If pt undergoes a cardiac device procedure during that time, they should be re-cleared.   Tachy-therapies to be programmed off if applicable with device back to pre-MRI settings after completion of exam.  [employer] was available remotely to assist in programming recommendations.   Buster Cash, RT  04/11/2024 4:50 PM

## 2024-04-11 NOTE — Assessment & Plan Note (Signed)
 Hemoglobin A1c 8.1% a year ago - Continue Jardiance, metformin , glipizide  - Start low-dose sliding scale insulin 

## 2024-04-11 NOTE — Progress Notes (Signed)
 Providing Compassionate, Quality Care - Together   Patient with progression of chronic L1 compression fracture following a fall. Recommend lumbosacral corset, pain control, and follow up as an outpatient in 2 weeks. Patient is fine to work with therapies while in the hospital. Brace is for comfort and to be worn ad lib.  Vital signs in last 24 hours: Temp:  [98.1 F (36.7 C)] 98.1 F (36.7 C) (06/02 0557) Pulse Rate:  [81-86] 81 (06/02 0600) BP: (140-159)/(75-76) 159/75 (06/02 0600) SpO2:  [99 %] 99 % (06/02 0600)   Lab Results: Recent Labs    04/09/24 2204  WBC 8.7  HGB 11.3*  HCT 33.9*  PLT 238   BMET Recent Labs    04/09/24 2204  NA 138  K 4.7  CL 105  CO2 21*  GLUCOSE 214*  BUN 34*  CREATININE 1.59*  CALCIUM  9.1    Studies/Results: CT CHEST ABDOMEN PELVIS WO CONTRAST Result Date: 04/09/2024 CLINICAL DATA:  Trauma EXAM: CT CHEST, ABDOMEN AND PELVIS WITHOUT CONTRAST TECHNIQUE: Multidetector CT imaging of the chest, abdomen and pelvis was performed following the standard protocol without IV contrast. RADIATION DOSE REDUCTION: This exam was performed according to the departmental dose-optimization program which includes automated exposure control, adjustment of the mA and/or kV according to patient size and/or use of iterative reconstruction technique. COMPARISON:  Chest CT 02/17/2023. CT renal stone 06/23/2022. Lumbar spine x-ray 04/02/2024. FINDINGS: CT CHEST FINDINGS Cardiovascular: The heart is mildly enlarged. There is no pericardial effusion. Left-sided pacemaker is present. There are atherosclerotic calcifications of the aorta. The aorta is normal in size. Mediastinum/Nodes: No enlarged mediastinal, hilar, or axillary lymph nodes. Thyroid  gland, trachea, and esophagus demonstrate no significant findings. Lungs/Pleura: Bronchiectasis in the bilateral lower lobes appears unchanged. Peripheral interstitial opacities are scattered throughout both lungs, unchanged from  the prior examination. There is no new focal lung infiltrate, pleural effusion or pneumothorax. Musculoskeletal: No chest wall mass or suspicious bone lesions identified. CT ABDOMEN PELVIS FINDINGS Hepatobiliary: No focal liver abnormality is seen. Status post cholecystectomy. No biliary dilatation. Pancreas: Unremarkable. No pancreatic ductal dilatation or surrounding inflammatory changes. Spleen: Normal in size without focal abnormality. Adrenals/Urinary Tract: There is a punctate nonobstructing calculus in the lower pole left kidney. Otherwise, the adrenal glands, kidneys and bladder are within normal limits. Stomach/Bowel: Stomach is within normal limits. Appendix appears normal. No evidence of bowel wall thickening, distention, or inflammatory changes. Colonic diverticulosis present. Vascular/Lymphatic: Aortic atherosclerosis. No enlarged abdominal or pelvic lymph nodes. Reproductive: Post PICC line is enlarged. Other: There is a small fat containing right inguinal hernia. No ascites. Musculoskeletal: There is subcutaneous edema lateral to the right hip. There is mild compression fracture of the superior endplate of L1 which has progressed compared to 04/02/2024. There are healed left superior and inferior pubic rami fractures. Degenerative changes affect the spine and hips. IMPRESSION: 1. Subcutaneous edema lateral to the right hip compatible with soft tissue contusion. 2. Mild compression fracture of the superior endplate of L1 has progressed compared to 04/02/2024. Correlate for point tenderness. 3. No other acute posttraumatic sequelae in the chest, abdomen or pelvis. 4. Stable bronchiectasis and interstitial opacities in the lungs. 5. Nonobstructing left renal calculus. 6. Colonic diverticulosis. 7. Aortic atherosclerosis. Aortic Atherosclerosis (ICD10-I70.0). Electronically Signed   By: Tyron Gallon M.D.   On: 04/09/2024 22:46   CT Head Wo Contrast Result Date: 04/09/2024 EXAM: CT HEAD AND CERVICAL  SPINE 04/09/2024 10:30:00 PM TECHNIQUE: CT of the head and cervical spine was performed  without the administration of intravenous contrast. Multiplanar reformatted images are provided for review. Automated exposure control, iterative reconstruction, and/or weight based adjustment of the mA/kV was utilized to reduce the radiation dose to as low as reasonably achievable. COMPARISON: CT head dated 04/02/2024 and CT cervical spine dated 07/17/2021. CLINICAL HISTORY: FINDINGS: HEAD: BRAIN AND VENTRICLES: There is no acute intracranial hemorrhage, mass effect or midline shift. No abnormal extra-axial fluid collection. The gray-white differentiation is maintained without evidence of an acute infarct. There is no evidence of hydrocephalus. Global cortical atrophy. Subcortical and periventricular small vessel ischemic changes. ORBITS: The visualized portion of the orbits demonstrate no acute abnormality. SINUSES: The visualized paranasal sinuses and mastoid air cells demonstrate no acute abnormality. SOFT TISSUES AND SKULL: No acute abnormality of the visualized skull or soft tissues. CERVICAL SPINE: BONES AND ALIGNMENT: There is no acute fracture or traumatic malalignment. DEGENERATIVE CHANGES: Mild degenerative changes of the mid / lower cervical spine. SOFT TISSUES: There is no prevertebral soft tissue swelling. VASCULATURE: Intracranial atherosclerosis. IMPRESSION: 1. No acute intracranial abnormality. 2. Mild degenerative changes of the mid/lower cervical spine. Electronically signed by: Zadie Herter MD 04/09/2024 10:40 PM EDT RP Workstation: [id number]   CT Cervical Spine Wo Contrast Result Date: 04/09/2024 EXAM: CT HEAD AND CERVICAL SPINE 04/09/2024 10:30:00 PM TECHNIQUE: CT of the head and cervical spine was performed without the administration of intravenous contrast. Multiplanar reformatted images are provided for review. Automated exposure control, iterative reconstruction, and/or weight based adjustment of  the mA/kV was utilized to reduce the radiation dose to as low as reasonably achievable. COMPARISON: CT head dated 04/02/2024 and CT cervical spine dated 07/17/2021. CLINICAL HISTORY: FINDINGS: HEAD: BRAIN AND VENTRICLES: There is no acute intracranial hemorrhage, mass effect or midline shift. No abnormal extra-axial fluid collection. The gray-white differentiation is maintained without evidence of an acute infarct. There is no evidence of hydrocephalus. Global cortical atrophy. Subcortical and periventricular small vessel ischemic changes. ORBITS: The visualized portion of the orbits demonstrate no acute abnormality. SINUSES: The visualized paranasal sinuses and mastoid air cells demonstrate no acute abnormality. SOFT TISSUES AND SKULL: No acute abnormality of the visualized skull or soft tissues. CERVICAL SPINE: BONES AND ALIGNMENT: There is no acute fracture or traumatic malalignment. DEGENERATIVE CHANGES: Mild degenerative changes of the mid / lower cervical spine. SOFT TISSUES: There is no prevertebral soft tissue swelling. VASCULATURE: Intracranial atherosclerosis. IMPRESSION: 1. No acute intracranial abnormality. 2. Mild degenerative changes of the mid/lower cervical spine. Electronically signed by: Zadie Herter MD 04/09/2024 10:40 PM EDT RP Workstation: [id number]     Henreitta Locus, DNP, AGNP-C Nurse Practitioner  [facility] [address], [address], [place], [place] [address] P: [phone]    F: [phone]  04/11/2024, 10:12 AM

## 2024-04-11 NOTE — Assessment & Plan Note (Signed)
 Continue home BiPAP.

## 2024-04-11 NOTE — Evaluation (Signed)
 Occupational Therapy Evaluation Patient Details Name: BONNIE ROIG MRN: [id number] DOB: October 03, 1933 Today's Date: 04/11/2024   History of Present Illness   89 y.o. male presents to [facility] 04/09/24 after losing balance when getting off the toilet and falling. No LOC or head injury. Stable L1 compression fx. Multiple prior falls with admit 5/24 for fall. PMHx: a-fib on eliquis , T2DM     Clinical Impressions Pt reports ind with ADLs, has had multiple falls recently and reports losing his balance. Pt currently needing up to mod A -max A for ADLs, min A for bed mobility rolling R/L. Pt declines EOB/OOB mobility this date due to pain. Educated pt on back precautions for comfort and pt verbalized understanding. Pt presenting with impairments listed below, will follow acutely. Patient will benefit from continued inpatient follow up therapy, <3 hours/day to maximize safety/ind with ADL/functional mobility.      If plan is discharge home, recommend the following:   Two people to help with walking and/or transfers;A lot of help with bathing/dressing/bathroom;Assistance with cooking/housework;Direct supervision/assist for medications management;Direct supervision/assist for financial management;Assist for transportation;Help with stairs or ramp for entrance     Functional Status Assessment   Patient has had a recent decline in their functional status and demonstrates the ability to make significant improvements in function in a reasonable and predictable amount of time.     Equipment Recommendations   Other (comment) (defer)     Recommendations for Other Services   PT consult     Precautions/Restrictions   Precautions Precautions: Fall;Back Precaution Booklet Issued: No (verbally reviewed) Required Braces or Orthoses: Spinal Brace Spinal Brace: Lumbar corset (for comfort) Restrictions Weight Bearing Restrictions Per Provider Order: No     Mobility Bed Mobility Overal bed mobility:  Needs Assistance Bed Mobility: Rolling Rolling: Min assist, Used rails         General bed mobility comments: pt defers sitting EOB due to pain, educated on log roll for comfort    Transfers                   General transfer comment: pt declined      Balance Overall balance assessment: History of Falls                                         ADL either performed or assessed with clinical judgement   ADL Overall ADL's : Needs assistance/impaired Eating/Feeding: Set up;Bed level   Grooming: Set up;Bed level   Upper Body Bathing: Moderate assistance;Bed level   Lower Body Bathing: Moderate assistance;Bed level   Upper Body Dressing : Moderate assistance;Bed level   Lower Body Dressing: Moderate assistance;Bed level   Toilet Transfer: Moderate assistance;+2 for physical assistance   Toileting- Clothing Manipulation and Hygiene: Maximal assistance       Functional mobility during ADLs: Moderate assistance;+2 for physical assistance       Vision   Vision Assessment?: No apparent visual deficits     Perception Perception: Not tested       Praxis Praxis: Not tested       Pertinent Vitals/Pain Pain Assessment Pain Assessment: Faces Pain Score: 6  Faces Pain Scale: Hurts even more Pain Location: back Pain Descriptors / Indicators: Aching, Discomfort Pain Intervention(s): Limited activity within patient's tolerance, Monitored during session, Repositioned     Extremity/Trunk Assessment Upper Extremity Assessment Upper Extremity Assessment: Generalized weakness   Lower  Extremity Assessment Lower Extremity Assessment: Defer to PT evaluation   Cervical / Trunk Assessment Cervical / Trunk Assessment: Normal   Communication Communication Communication: Impaired   Cognition Arousal: Alert Behavior During Therapy: WFL for tasks assessed/performed Cognition: No apparent impairments                                Following commands: Intact       Cueing  General Comments   Cueing Techniques: Verbal cues  VSS   Exercises     Shoulder Instructions      Home Living Family/patient expects to be discharged to:: Private residence Living Arrangements: Spouse/significant other Available Help at Discharge: Family;Available 24 hours/day (spouse cannot help physically) Type of Home: House Home Access: Level entry     Home Layout: One level     Bathroom Shower/Tub: Producer, television/film/video: Handicapped height     Home Equipment: Agricultural consultant (2 wheels);Wheelchair - manual;BSC/3in1;Grab bars - tub/shower;Grab bars - toilet;Shower seat - built in   Additional Comments: upright rollator      Prior Functioning/Environment Prior Level of Function : History of Falls (last six months);Needs assist             Mobility Comments: About 6 prior falls, reports losing his balance. Has been needing increased assistance for mobility. Short distances with RW ADLs Comments: Ind with dressing. Assist to dry off when bathing. Assist for iADLs    OT Problem List: Decreased strength;Decreased range of motion;Impaired balance (sitting and/or standing);Decreased activity tolerance;Decreased safety awareness;Pain   OT Treatment/Interventions: Self-care/ADL training;Therapeutic exercise;Energy conservation;DME and/or AE instruction;Therapeutic activities;Balance training;Patient/family education      OT Goals(Current goals can be found in the care plan section)   Acute Rehab OT Goals Patient Stated Goal: none stated OT Goal Formulation: With patient Time For Goal Achievement: 04/25/24 Potential to Achieve Goals: Good ADL Goals Pt Will Perform Grooming: with set-up;standing Pt Will Perform Upper Body Dressing: with supervision;sitting Pt Will Perform Lower Body Dressing: with supervision;sitting/lateral leans;sit to/from stand Pt Will Transfer to Toilet: with  supervision;ambulating;regular height toilet   OT Frequency:  Min 2X/week    Co-evaluation              AM-PAC OT "6 Clicks" Daily Activity     Outcome Measure Help from another person eating meals?: A Little Help from another person taking care of personal grooming?: A Little Help from another person toileting, which includes using toliet, bedpan, or urinal?: A Lot Help from another person bathing (including washing, rinsing, drying)?: A Lot Help from another person to put on and taking off regular upper body clothing?: A Lot Help from another person to put on and taking off regular lower body clothing?: A Lot 6 Click Score: 14   End of Session Nurse Communication: Mobility status  Activity Tolerance: Patient limited by pain Patient left: in bed;with call bell/phone within reach  OT Visit Diagnosis: Unsteadiness on feet (R26.81);Other abnormalities of gait and mobility (R26.89);Muscle weakness (generalized) (M62.81);History of falling (Z91.81)                Time: 4332-9518 OT Time Calculation (min): 15 min Charges:  OT General Charges $OT Visit: 1 Visit OT Evaluation $OT Eval Low Complexity: 1 Low  Tracker Mance K, OTD, OTR/L SecureChat Preferred Acute Rehab [phone]   Trayson Stitely K Koonce 04/11/2024, 10:58 AM

## 2024-04-12 DIAGNOSIS — E782 Mixed hyperlipidemia: Secondary | ICD-10-CM | POA: Diagnosis not present

## 2024-04-12 DIAGNOSIS — Z7984 Long term (current) use of oral hypoglycemic drugs: Secondary | ICD-10-CM | POA: Diagnosis not present

## 2024-04-12 DIAGNOSIS — I129 Hypertensive chronic kidney disease with stage 1 through stage 4 chronic kidney disease, or unspecified chronic kidney disease: Secondary | ICD-10-CM | POA: Diagnosis not present

## 2024-04-12 DIAGNOSIS — Z9181 History of falling: Secondary | ICD-10-CM | POA: Diagnosis not present

## 2024-04-12 DIAGNOSIS — Z950 Presence of cardiac pacemaker: Secondary | ICD-10-CM | POA: Diagnosis not present

## 2024-04-12 DIAGNOSIS — M4856XA Collapsed vertebra, not elsewhere classified, lumbar region, initial encounter for fracture: Secondary | ICD-10-CM | POA: Diagnosis not present

## 2024-04-12 DIAGNOSIS — Z87891 Personal history of nicotine dependence: Secondary | ICD-10-CM | POA: Diagnosis not present

## 2024-04-12 DIAGNOSIS — G4733 Obstructive sleep apnea (adult) (pediatric): Secondary | ICD-10-CM | POA: Diagnosis not present

## 2024-04-12 DIAGNOSIS — D649 Anemia, unspecified: Secondary | ICD-10-CM | POA: Diagnosis not present

## 2024-04-12 DIAGNOSIS — N1831 Chronic kidney disease, stage 3a: Secondary | ICD-10-CM | POA: Diagnosis not present

## 2024-04-12 DIAGNOSIS — Z79899 Other long term (current) drug therapy: Secondary | ICD-10-CM | POA: Diagnosis not present

## 2024-04-12 DIAGNOSIS — S0990XA Unspecified injury of head, initial encounter: Secondary | ICD-10-CM | POA: Diagnosis not present

## 2024-04-12 DIAGNOSIS — Z86711 Personal history of pulmonary embolism: Secondary | ICD-10-CM | POA: Diagnosis not present

## 2024-04-12 DIAGNOSIS — J849 Interstitial pulmonary disease, unspecified: Secondary | ICD-10-CM | POA: Diagnosis not present

## 2024-04-12 DIAGNOSIS — K5190 Ulcerative colitis, unspecified, without complications: Secondary | ICD-10-CM | POA: Diagnosis not present

## 2024-04-12 DIAGNOSIS — Z7901 Long term (current) use of anticoagulants: Secondary | ICD-10-CM | POA: Diagnosis not present

## 2024-04-12 DIAGNOSIS — S22000A Wedge compression fracture of unspecified thoracic vertebra, initial encounter for closed fracture: Secondary | ICD-10-CM | POA: Diagnosis not present

## 2024-04-12 DIAGNOSIS — E1122 Type 2 diabetes mellitus with diabetic chronic kidney disease: Secondary | ICD-10-CM | POA: Diagnosis not present

## 2024-04-12 DIAGNOSIS — I4891 Unspecified atrial fibrillation: Secondary | ICD-10-CM | POA: Diagnosis not present

## 2024-04-12 DIAGNOSIS — E1169 Type 2 diabetes mellitus with other specified complication: Secondary | ICD-10-CM | POA: Diagnosis not present

## 2024-04-12 DIAGNOSIS — R29890 Loss of height: Secondary | ICD-10-CM | POA: Diagnosis not present

## 2024-04-12 DIAGNOSIS — E66811 Obesity, class 1: Secondary | ICD-10-CM | POA: Insufficient documentation

## 2024-04-12 LAB — GLUCOSE, CAPILLARY
Glucose-Capillary: 179 mg/dL — ABNORMAL HIGH (ref 70–99)
Glucose-Capillary: 246 mg/dL — ABNORMAL HIGH (ref 70–99)
Glucose-Capillary: 232 mg/dL — ABNORMAL HIGH (ref 70–99)
Glucose-Capillary: 196 mg/dL — ABNORMAL HIGH (ref 70–99)

## 2024-04-12 LAB — CBC
HCT: 37.5 % — ABNORMAL LOW (ref 39.0–52.0)
Hemoglobin: 12.6 g/dL — ABNORMAL LOW (ref 13.0–17.0)
MCH: 32.2 pg (ref 26.0–34.0)
MCHC: 33.6 g/dL (ref 30.0–36.0)
MCV: 95.9 fL (ref 80.0–100.0)
Platelets: 258 10*3/uL (ref 150–400)
RBC: 3.91 MIL/uL — ABNORMAL LOW (ref 4.22–5.81)
RDW: 14.8 % (ref 11.5–15.5)
WBC: 8.6 10*3/uL (ref 4.0–10.5)
nRBC: 0 % (ref 0.0–0.2)

## 2024-04-12 LAB — BASIC METABOLIC PANEL WITH GFR
Anion gap: 12 (ref 5–15)
BUN: 34 mg/dL — ABNORMAL HIGH (ref 8–23)
CO2: 22 mmol/L (ref 22–32)
Calcium: 9.1 mg/dL (ref 8.9–10.3)
Chloride: 105 mmol/L (ref 98–111)
Creatinine, Ser: 1.67 mg/dL — ABNORMAL HIGH (ref 0.61–1.24)
GFR, Estimated: 39 mL/min — ABNORMAL LOW (ref >60)
Glucose, Bld: 181 mg/dL — ABNORMAL HIGH (ref 70–99)
Potassium: 4.3 mmol/L (ref 3.5–5.1)
Sodium: 139 mmol/L (ref 135–145)

## 2024-04-12 LAB — HEMOGLOBIN A1C
Hgb A1c MFr Bld: 7.2 % — ABNORMAL HIGH (ref 4.8–5.6)
Mean Plasma Glucose: 159.94 mg/dL

## 2024-04-12 MED ORDER — INSULIN ASPART 100 UNIT/ML IJ SOLN
0.0000 [IU] | Freq: Three times a day (TID) | INTRAMUSCULAR | Status: DC
  Administered 2024-04-12: 5 [IU] via SUBCUTANEOUS
  Administered 2024-04-13: 3 [IU] via SUBCUTANEOUS
  Administered 2024-04-13: 8 [IU] via SUBCUTANEOUS
  Administered 2024-04-13: 2 [IU] via SUBCUTANEOUS
  Administered 2024-04-14 (×3): 3 [IU] via SUBCUTANEOUS
  Administered 2024-04-15: 8 [IU] via SUBCUTANEOUS

## 2024-04-12 NOTE — Assessment & Plan Note (Signed)
 BMI 30.1 complicates care

## 2024-04-12 NOTE — Care Management Obs Status (Signed)
 MEDICARE OBSERVATION STATUS NOTIFICATION   Patient Details  Name: BLONG BUSK MRN: [id number] Date of Birth: 14-Jun-1933   Medicare Observation Status Notification Given:  Yes  Moon/Obs letter signed and a copy given   Wynonia Hedges 04/12/2024, 11:57 AM

## 2024-04-12 NOTE — Progress Notes (Signed)
  Progress Note   Patient: Manuel Atkins ZOX:[id number] DOB: 12/10/1932 DOA: 04/09/2024     0 DOS: the patient was seen and examined on 04/12/2024 at 8:59AM      Brief hospital course: 89 y.o. M with HTN, ILD not on home O2, OSA on BiPAP, DM, UC no longer on disease specific treatment, CKD IIIa baseline 1.4-1.6, hx esophageal stricture, fatty liver, hx PE in 2020, still on Eliquis  who presented with L1 compression fracture.  See H&P for further details.     Assessment and Plan: * Compression fracture of body of L1 vertebra (HCC) MRI confirms compression fracture L1, demonstrates also asymptomatic compression fracture at T6.  Discussed case with IR, fractures appear to be amenable to kyphoplasty.  - Hold Eliquis  - Plan for kyphoplasty as early as Thursday, then discharge to SNF - Continue scheduled acetaminophen  - Continue as needed oxycodone - PT - Continue chest corset recommended by neurosurgery - Outpatient follow-up with orthopedics   Class 1 obesity BMI 30.1 complicates care  CKD (chronic kidney disease), stage IIIa (HCC) Creatinine stable relative to baseline at 1.6  History of pulmonary embolus (PE) History of segmental PE many years ago.  Pulmonology have kept him on Eliquis  due to his dyspnea and age. - Hold Eliquis  for possible kyphoplasty - SCDs  OSA (obstructive sleep apnea) - Continue home BiPAP  ILD (interstitial lung disease) (HCC) Mild, not on disease modifying treatment now on home oxygen   Anemia Hemoglobin 11.3, stable from baseline  Ulcerative colitis (HCC) No longer on infliximab  as an outpatient.  Type 2 diabetes mellitus with hyperlipidemia (HCC) Glucose elevated Hemoglobin A1c 8.1% a year ago - Continue Jardiance, metformin , glipizide  - Continue sliding scale corrections, increase dose  Essential hypertension, benign Blood pressure control - Continue amlodipine , carvedilol , losartan  - As needed hydralazine   Mixed hyperlipidemia -  Continue Crestor           Subjective: Patient refuses to move due to pain.  Nursing have not mobilized him.  He has no leg symptoms.  No fever.     Physical Exam: BP 114/79 (BP Location: Left Arm)   Pulse 77   Temp 97.7 F (36.5 C) (Oral)   Resp 18   Ht 6' (1.829 m)   Wt 100.7 kg   SpO2 98%   BMI 30.11 kg/m   Adult male, lying in bed, interactive and appropriate RRR, no murmurs, no peripheral edema Respiratory normal, lungs clear without rales or wheezes Abdomen soft no tenderness palpation He has no point tenderness in the spine, he indicates pain in the mid to lower back Attention normal, affect normal, he has mild cognitive impairment due to age, probably exacerbated by oxycodone, this severely limits the ability to collect history reliably has generalized weakness, most movement is limited by pain    Data Reviewed: Discussed with interventional radiology Basic metabolic panel shows stable renal function CBC unremarkable  Family Communication: None present    Disposition: Status is: Observation The patient was admitted for intractable back pain.  He is unable to stand due to pain and will require rehabilitation.    So far, despite opiates, he is not able to participate with therapy, so IR plan for kyphoplasty after Eliquis  washout on Thursday, after that likely to skilled nursing facility for rehab        Author: Ephriam Hashimoto, MD 04/12/2024 2:50 PM  For on call review [email or web address].

## 2024-04-12 NOTE — Plan of Care (Signed)
  Problem: Coping: Goal: Ability to adjust to condition or change in health will improve Outcome: Progressing   Problem: Education: Goal: Ability to describe self-care measures that may prevent or decrease complications (Diabetes Survival Skills Education) will improve Outcome: Progressing Goal: Individualized Educational Video(s) Outcome: Progressing   Problem: Nutritional: Goal: Maintenance of adequate nutrition will improve Outcome: Progressing Goal: Progress toward achieving an optimal weight will improve Outcome: Progressing   Problem: Skin Integrity: Goal: Risk for impaired skin integrity will decrease Outcome: Progressing   Problem: Clinical Measurements: Goal: Ability to maintain clinical measurements within normal limits will improve Outcome: Progressing Goal: Will remain free from infection Outcome: Progressing Goal: Diagnostic test results will improve Outcome: Progressing Goal: Respiratory complications will improve Outcome: Progressing Goal: Cardiovascular complication will be avoided Outcome: Progressing   Problem: Elimination: Goal: Will not experience complications related to bowel motility Outcome: Progressing Goal: Will not experience complications related to urinary retention Outcome: Progressing   Problem: Safety: Goal: Ability to remain free from injury will improve Outcome: Progressing   Problem: Pain Managment: Goal: General experience of comfort will improve and/or be controlled Outcome: Progressing

## 2024-04-12 NOTE — Plan of Care (Signed)
  Problem: Pain Managment: Goal: General experience of comfort will improve and/or be controlled Outcome: Progressing

## 2024-04-12 NOTE — Progress Notes (Signed)
 Physical Therapy Treatment Patient Details Name: DEMONTREZ Atkins MRN: [id number] DOB: [birth year] Today's Date: 04/12/2024   History of Present Illness 89 y.o. male presents to [facility] 04/09/24 after losing balance when getting off the toilet and falling. No LOC or head injury. Stable L1 compression fx. Multiple prior falls with admit 5/24 for fall. PMHx: a-fib on eliquis , T2DM    PT Comments  Pt premedicated for session and sitting EOB with RN upon arrival. Continues to report 8/10 pain but was able to manage sit-stand and pivotal steps to [facility] initially with minA of 2 but progressed to minA of 1 with use of RW. Pt declined further gait or standing exercises due to fatigue and back pain, denies pain in LE and endorses frequent falls at home due to LE buckling (none today). The pt also endorsed onset of room spinning with sit-sidelying-supine transition, no nystagmus noted but recommend further vestibular workup as pt with x2 falls in which he hit his head recently. Will continue to benefit from skilled PT to progress functional strength, stability, endurance, and independence. Pt and wife in agreement with plan and verbalize understanding of education regarding back precautions, brace, and log roll.     If plan is discharge home, recommend the following: A lot of help with walking and/or transfers;A lot of help with bathing/dressing/bathroom;Assistance with cooking/housework;Help with stairs or ramp for entrance;Assist for transportation   Can travel by private vehicle     No  Equipment Recommendations  None recommended by PT    Recommendations for Other Services       Precautions / Restrictions Precautions Precautions: Fall;Back Precaution Booklet Issued: No Precaution/Restrictions Comments: stable L1 compression fx, no brace per MD. Back precautions for comfort Required Braces or Orthoses: Spinal Brace Spinal Brace: Lumbar corset;Other (comment) Spinal Brace Comments: for  comfort Restrictions Weight Bearing Restrictions Per Provider Order: No     Mobility  Bed Mobility Overal bed mobility: Needs Assistance Bed Mobility: Rolling, Sit to Sidelying Rolling: Min assist, Used rails       Sit to sidelying: Min assist General bed mobility comments: pt sitting EOB with RN at PT arrival, minA to bring LE into bed with cues for log roll. +2 to reposition in bed    Transfers Overall transfer level: Needs assistance Equipment used: Rolling walker (2 wheels), 1 person hand held assist Transfers: Sit to/from Stand, Bed to chair/wheelchair/BSC Sit to Stand: Min assist, +2 safety/equipment   Step pivot transfers: Min assist, +2 physical assistance, +2 safety/equipment       General transfer comment: initialyl minA of 2 to rise, HHA and minA of 2 to steady with lateral steps to [facility]. minA of 1 to rise from [facility] and minA with RW to pivot back to bed    Ambulation/Gait               General Gait Details: limited to pivotal steps, pt declined further standing marches or gait due to pain and fatigue       Balance Overall balance assessment: History of Falls                                          Communication Communication Communication: Impaired Factors Affecting Communication: Hearing impaired  Cognition Arousal: Alert Behavior During Therapy: WFL for tasks assessed/performed   PT - Cognitive impairments: No apparent impairments  Following commands: Intact      Cueing Cueing Techniques: Verbal cues  Exercises      General Comments General comments (skin integrity, edema, etc.): wife present and supportive, educated on spinal precatuions, log roll, and brace use in anticipation of surgery thrusday      Pertinent Vitals/Pain Pain Assessment Pain Assessment: 0-10 Pain Score: 8  Faces Pain Scale: Hurts whole lot Pain Location: back Pain Descriptors / Indicators: Aching,  Discomfort Pain Intervention(s): Limited activity within patient's tolerance, Monitored during session, Premedicated before session, Repositioned    Home Living                          Prior Function            PT Goals (current goals can now be found in the care plan section) Acute Rehab PT Goals Patient Stated Goal: to get stronger and prevent future falls PT Goal Formulation: With patient/family Time For Goal Achievement: 04/24/24 Potential to Achieve Goals: Good Progress towards PT goals: Progressing toward goals    Frequency    Min 2X/week      PT Plan      Co-evaluation              AM-PAC PT "6 Clicks" Mobility   Outcome Measure  Help needed turning from your back to your side while in a flat bed without using bedrails?: A Little Help needed moving from lying on your back to sitting on the side of a flat bed without using bedrails?: A Little Help needed moving to and from a bed to a chair (including a wheelchair)?: A Lot Help needed standing up from a chair using your arms (e.g., wheelchair or bedside chair)?: A Lot Help needed to walk in hospital room?: Total (<20 ft) Help needed climbing 3-5 steps with a railing? : Total 6 Click Score: 12    End of Session Equipment Utilized During Treatment: Gait belt Activity Tolerance: Patient limited by pain Patient left: in bed;with call bell/phone within reach;with family/visitor present;with bed alarm set Nurse Communication: Mobility status PT Visit Diagnosis: Unsteadiness on feet (R26.81);Other abnormalities of gait and mobility (R26.89);Muscle weakness (generalized) (M62.81);History of falling (Z91.81)     Time: 9528-4132 PT Time Calculation (min) (ACUTE ONLY): 23 min  Charges:    $Therapeutic Exercise: 8-22 mins $Therapeutic Activity: 8-22 mins PT General Charges $$ ACUTE PT VISIT: 1 Visit                     Barnabas Booth, PT, DPT   Acute Rehabilitation Department Office  [phone] Secure Chat Communication Preferred   Manuel Atkins 04/12/2024, 4:00 PM

## 2024-04-12 NOTE — Progress Notes (Addendum)
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 limited to education regarding the natural healing process of compression fractures without intervention, bleeding, infection, cement migration which may cause spinal cord damage, paralysis, pulmonary embolism or even death.  This interventional procedure involves the use of X-rays and because of the nature of the planned procedure, it is possible that we will have prolonged use of X-ray fluoroscopy.  Potential radiation risks to you include (but are not limited to) the following: - A slightly elevated risk for cancer  several years later in life. This risk is typically less than 0.5% percent. This risk is low in comparison to the normal incidence of human cancer, which is 33% for women and 50% for men according to the [employer]. - Radiation induced injury can include skin redness, resembling a rash, tissue breakdown / ulcers and hair loss (which can be temporary or permanent).    The likelihood of either of these occurring depends on the difficulty of the procedure and whether you are sensitive to radiation due to previous procedures, disease, or genetic conditions.   IF your procedure requires a prolonged use of radiation, you will be notified and given written instructions for further action.  It is your responsibility to monitor the irradiated area for the 2 weeks following the procedure and to notify your physician if you are concerned that you have suffered a radiation induced injury.    All of the patient's questions were answered. Consent signed.   Electronically Signed: Prudence Brown, PA-C 04/12/2024, 1:44 PM   I spent a total of 30 minutes in face to face in clinical consultation, greater than 50% of which was counseling/coordinating care for kyphoplasty

## 2024-04-12 NOTE — Progress Notes (Signed)
 Assisted pt. With setting up his home cpap. No issues at this time.

## 2024-04-13 ENCOUNTER — Ambulatory Visit (INDEPENDENT_AMBULATORY_CARE_PROVIDER_SITE_OTHER): Admitting: Gastroenterology

## 2024-04-13 DIAGNOSIS — Z950 Presence of cardiac pacemaker: Secondary | ICD-10-CM | POA: Diagnosis not present

## 2024-04-13 DIAGNOSIS — Z7984 Long term (current) use of oral hypoglycemic drugs: Secondary | ICD-10-CM | POA: Diagnosis not present

## 2024-04-13 DIAGNOSIS — E1169 Type 2 diabetes mellitus with other specified complication: Secondary | ICD-10-CM | POA: Diagnosis not present

## 2024-04-13 DIAGNOSIS — M4856XA Collapsed vertebra, not elsewhere classified, lumbar region, initial encounter for fracture: Secondary | ICD-10-CM | POA: Diagnosis not present

## 2024-04-13 DIAGNOSIS — N1831 Chronic kidney disease, stage 3a: Secondary | ICD-10-CM | POA: Diagnosis not present

## 2024-04-13 DIAGNOSIS — Z79899 Other long term (current) drug therapy: Secondary | ICD-10-CM | POA: Diagnosis not present

## 2024-04-13 DIAGNOSIS — Z9181 History of falling: Secondary | ICD-10-CM | POA: Diagnosis not present

## 2024-04-13 DIAGNOSIS — I129 Hypertensive chronic kidney disease with stage 1 through stage 4 chronic kidney disease, or unspecified chronic kidney disease: Secondary | ICD-10-CM | POA: Diagnosis not present

## 2024-04-13 DIAGNOSIS — D649 Anemia, unspecified: Secondary | ICD-10-CM | POA: Diagnosis not present

## 2024-04-13 DIAGNOSIS — E1122 Type 2 diabetes mellitus with diabetic chronic kidney disease: Secondary | ICD-10-CM | POA: Diagnosis not present

## 2024-04-13 DIAGNOSIS — Z87891 Personal history of nicotine dependence: Secondary | ICD-10-CM | POA: Diagnosis not present

## 2024-04-13 DIAGNOSIS — G4733 Obstructive sleep apnea (adult) (pediatric): Secondary | ICD-10-CM | POA: Diagnosis not present

## 2024-04-13 DIAGNOSIS — J849 Interstitial pulmonary disease, unspecified: Secondary | ICD-10-CM | POA: Diagnosis not present

## 2024-04-13 DIAGNOSIS — K5190 Ulcerative colitis, unspecified, without complications: Secondary | ICD-10-CM | POA: Diagnosis not present

## 2024-04-13 DIAGNOSIS — S22000A Wedge compression fracture of unspecified thoracic vertebra, initial encounter for closed fracture: Secondary | ICD-10-CM | POA: Diagnosis not present

## 2024-04-13 DIAGNOSIS — E782 Mixed hyperlipidemia: Secondary | ICD-10-CM | POA: Diagnosis not present

## 2024-04-13 DIAGNOSIS — Z7901 Long term (current) use of anticoagulants: Secondary | ICD-10-CM | POA: Diagnosis not present

## 2024-04-13 DIAGNOSIS — R29890 Loss of height: Secondary | ICD-10-CM | POA: Diagnosis not present

## 2024-04-13 DIAGNOSIS — Z86711 Personal history of pulmonary embolism: Secondary | ICD-10-CM | POA: Diagnosis not present

## 2024-04-13 DIAGNOSIS — S0990XA Unspecified injury of head, initial encounter: Secondary | ICD-10-CM | POA: Diagnosis not present

## 2024-04-13 DIAGNOSIS — I4891 Unspecified atrial fibrillation: Secondary | ICD-10-CM | POA: Diagnosis not present

## 2024-04-13 LAB — BASIC METABOLIC PANEL WITH GFR
Anion gap: 9 (ref 5–15)
BUN: 35 mg/dL — ABNORMAL HIGH (ref 8–23)
CO2: 20 mmol/L — ABNORMAL LOW (ref 22–32)
Calcium: 8.9 mg/dL (ref 8.9–10.3)
Chloride: 108 mmol/L (ref 98–111)
Creatinine, Ser: 1.72 mg/dL — ABNORMAL HIGH (ref 0.61–1.24)
GFR, Estimated: 38 mL/min — ABNORMAL LOW (ref >60)
Glucose, Bld: 154 mg/dL — ABNORMAL HIGH (ref 70–99)
Potassium: 4.3 mmol/L (ref 3.5–5.1)
Sodium: 137 mmol/L (ref 135–145)

## 2024-04-13 LAB — CBC
HCT: 34.7 % — ABNORMAL LOW (ref 39.0–52.0)
Hemoglobin: 11.6 g/dL — ABNORMAL LOW (ref 13.0–17.0)
MCH: 32 pg (ref 26.0–34.0)
MCHC: 33.4 g/dL (ref 30.0–36.0)
MCV: 95.6 fL (ref 80.0–100.0)
Platelets: 225 10*3/uL (ref 150–400)
RBC: 3.63 MIL/uL — ABNORMAL LOW (ref 4.22–5.81)
RDW: 14.8 % (ref 11.5–15.5)
WBC: 7.1 10*3/uL (ref 4.0–10.5)
nRBC: 0 % (ref 0.0–0.2)

## 2024-04-13 LAB — SURGICAL PCR SCREEN
MRSA, PCR: NEGATIVE
Staphylococcus aureus: NEGATIVE

## 2024-04-13 LAB — GLUCOSE, CAPILLARY
Glucose-Capillary: 158 mg/dL — ABNORMAL HIGH (ref 70–99)
Glucose-Capillary: 277 mg/dL — ABNORMAL HIGH (ref 70–99)
Glucose-Capillary: 122 mg/dL — ABNORMAL HIGH (ref 70–99)
Glucose-Capillary: 223 mg/dL — ABNORMAL HIGH (ref 70–99)

## 2024-04-13 LAB — PROTIME-INR
INR: 1.2 (ref 0.8–1.2)
Prothrombin Time: 15.5 s — ABNORMAL HIGH (ref 11.4–15.2)

## 2024-04-13 MED ORDER — CEFAZOLIN SODIUM-DEXTROSE 2-4 GM/100ML-% IV SOLN
2.0000 g | INTRAVENOUS | Status: AC
  Filled 2024-04-13: qty 100

## 2024-04-13 NOTE — Progress Notes (Signed)
 Physical Therapy Treatment Patient Details Name: Manuel Atkins MRN: [id number] DOB: 28-Mar-1933 Today's Date: 04/13/2024   History of Present Illness 89 y.o. male presents to [facility] 04/09/24 after losing balance when getting off the toilet and falling. No LOC or head injury. Stable L1 compression fx. Multiple prior falls with admit 5/24 for fall. PMHx: a-fib on eliquis , T2DM    PT Comments  Pt denies dizziness. Worked on getting to EOB pt Max A for supine to sitting and Min A for sitting to supine. Pt is currently limited by back pain. Pt was assisted with using the urinal on request in supine. Pt spouse and granddaughter present at beginning and end of session. Due to pt current functional status, home set up and available assistance at home recommending skilled physical therapy services < 3 hours/day in order to address strength, balance and functional mobility to decrease risk for falls, injury, immobility, skin break down and re-hospitalization.      If plan is discharge home, recommend the following: A lot of help with walking and/or transfers;Assistance with cooking/housework;Help with stairs or ramp for entrance;Assist for transportation   Can travel by private vehicle     No  Equipment Recommendations  None recommended by PT       Precautions / Restrictions Precautions Precautions: Fall;Back Precaution Booklet Issued: No Precaution/Restrictions Comments: stable L1 compression fx, no brace per MD. Back precautions for comfort Required Braces or Orthoses: Spinal Brace Spinal Brace: Lumbar corset Spinal Brace Comments: for comfort Restrictions Weight Bearing Restrictions Per Provider Order: No     Mobility  Bed Mobility Overal bed mobility: Needs Assistance Bed Mobility: Rolling, Sit to Sidelying, Sidelying to Sit Rolling: Min assist, Used rails Sidelying to sit: Max assist     Sit to sidelying: Min assist General bed mobility comments: Max A for trunk to mid line and Min A for  LE to floor with verbal step by step cues for log roll. Pt was Min A for LE back into bed for sitting to side lying and Mod A for scooting up in the bed with instruction on technique in order to assist this PT in going up toward [facility]    Transfers   General transfer comment: unable due to pain          Communication Communication Communication: Impaired Factors Affecting Communication: Hearing impaired  Cognition Arousal: Alert Behavior During Therapy: WFL for tasks assessed/performed   PT - Cognitive impairments: No apparent impairments         Following commands: Intact      Cueing Cueing Techniques: Verbal cues         Pertinent Vitals/Pain Pain Assessment Faces Pain Scale: Hurts whole lot Pain Location: back Pain Descriptors / Indicators: Aching, Discomfort Pain Intervention(s): Monitored during session, Limited activity within patient's tolerance, Patient requesting pain meds-RN notified     PT Goals (current goals can now be found in the care plan section) Acute Rehab PT Goals Patient Stated Goal: to get stronger and prevent future falls PT Goal Formulation: With patient/family Time For Goal Achievement: 04/24/24 Potential to Achieve Goals: Good Progress towards PT goals: Progressing toward goals    Frequency    Min 2X/week      PT Plan  Continue with current POC        AM-PAC PT "6 Clicks" Mobility   Outcome Measure  Help needed turning from your back to your side while in a flat bed without using bedrails?: A Little Help needed moving from  lying on your back to sitting on the side of a flat bed without using bedrails?: A Lot Help needed moving to and from a bed to a chair (including a wheelchair)?: A Lot Help needed standing up from a chair using your arms (e.g., wheelchair or bedside chair)?: A Lot Help needed to walk in hospital room?: Total Help needed climbing 3-5 steps with a railing? : Total 6 Click Score: 11    End of Session    Activity Tolerance: Patient limited by pain Patient left: in bed;with call bell/phone within reach;with family/visitor present;with bed alarm set Nurse Communication: Mobility status PT Visit Diagnosis: Unsteadiness on feet (R26.81);Other abnormalities of gait and mobility (R26.89);Muscle weakness (generalized) (M62.81);History of falling (Z91.81)     Time: 1610-9604 PT Time Calculation (min) (ACUTE ONLY): 23 min  Charges:    $Therapeutic Activity: 23-37 mins PT General Charges $$ ACUTE PT VISIT: 1 Visit                    Sloan Duncans, DPT, CLT  Acute Rehabilitation Services Office: [phone] (Secure chat preferred)    Jenice Mitts 04/13/2024, 2:17 PM

## 2024-04-13 NOTE — Progress Notes (Signed)
 Progress Note    Manuel Atkins   QMV:[id number]  DOB: 04-03-1933  DOA: 04/09/2024     0 PCP: Manuel Pulling, MD  Initial CC: fall at home  Hospital Course: Manuel Atkins is an 89 y.o. male with PMH HTN, ILD not on home O2, OSA on BiPAP, DM, UC no longer on disease specific treatment, CKD IIIa, hx esophageal stricture, fatty liver, hx PE in 2020, still on Eliquis  who presented after a fall at home. He underwent imaging on admission for back pain.  MRI T-spine showed T6 nondisplaced fracture and MRI L-spine showed acute/subacute compression fracture of L1 with approximately 30% height loss centrally. IR was consulted for consideration of kyphoplasty.  Interval History:  Resting in bed comfortable when seen today.  Understands plan for kyphoplasty tomorrow.  No concerns or questions today.  Assessment and Plan: * Compression fracture of body of L1 vertebra (HCC) - per MRI L spine on 04/11/24: "Acute/subacute compression fracture of L1 with approximately 30% height loss centrally. No retropulsion." - Eliquis  on hold - Appreciate IR evaluation.  Tentative plan for kyphoplasty on 04/14/2024 - Rehab has also been found at [facility] with bed available.  Potential discharge Thursday depending on timing of kyphoplasty and how patient feels otherwise would aim for Friday  History of pulmonary embolus (PE) History of segmental PE many years ago.  Pulmonology have kept him on Eliquis  due to his dyspnea and age. - Hold Eliquis  for kyphoplasty - SCDs  Class 1 obesity BMI 30.1 complicates care  CKD (chronic kidney disease), stage IIIa (HCC) Creatinine stable relative to baseline at 1.6  OSA (obstructive sleep apnea) - Continue home BiPAP  ILD (interstitial lung disease) (HCC) Mild, not on disease modifying treatment now on home oxygen   Anemia Hemoglobin 11.3, stable from baseline  Ulcerative colitis (HCC) No longer on infliximab  as an outpatient.  Type 2 diabetes mellitus with  hyperlipidemia (HCC) Hemoglobin A1c 8.1% a year ago - Continue Jardiance, metformin , glipizide  - Start low-dose sliding scale insulin   Essential hypertension, benign - Continue amlodipine , carvedilol , losartan  - As needed hydralazine   Mixed hyperlipidemia - Continue Crestor    Old records reviewed in assessment of this patient  Antimicrobials:   DVT prophylaxis:  SCDs Start: 04/11/24 1551   Code Status:   Code Status: Full Code  Mobility Assessment (Last 72 Hours)     Mobility Assessment     Row Name 04/13/24 1416 04/13/24 0825 04/12/24 2000 04/12/24 1500 04/12/24 0944   Does patient have an order for bedrest or is patient medically unstable -- No - Continue assessment No - Continue assessment -- No - Continue assessment   What is the highest level of mobility based on the progressive mobility assessment? Level 1 (Bedfast) - Unable to balance while sitting on edge of bed Level 1 (Bedfast) - Unable to balance while sitting on edge of bed Level 1 (Bedfast) - Unable to balance while sitting on edge of bed Level 3 (Stands with assist) - Balance while standing  and cannot march in place Level 1 (Bedfast) - Unable to balance while sitting on edge of bed   Is the above level different from baseline mobility prior to current illness? -- Yes - Recommend PT order Yes - Recommend PT order -- Yes - Recommend PT order    Row Name 04/12/24 0600 04/12/24 0350 04/12/24 0015 04/11/24 2200 04/11/24 2000   Does patient have an order for bedrest or is patient medically unstable No - Continue assessment No -  Continue assessment No - Continue assessment No - Continue assessment No - Continue assessment   What is the highest level of mobility based on the progressive mobility assessment? Level 1 (Bedfast) - Unable to balance while sitting on edge of bed Level 1 (Bedfast) - Unable to balance while sitting on edge of bed Level 1 (Bedfast) - Unable to balance while sitting on edge of bed Level 1 (Bedfast) -  Unable to balance while sitting on edge of bed Level 1 (Bedfast) - Unable to balance while sitting on edge of bed   Is the above level different from baseline mobility prior to current illness? Yes - Recommend PT order Yes - Recommend PT order Yes - Recommend PT order Yes - Recommend PT order Yes - Recommend PT order    Row Name 04/11/24 1809 04/11/24 1800 04/11/24 1000       Does patient have an order for bedrest or is patient medically unstable -- No - Continue assessment --     What is the highest level of mobility based on the progressive mobility assessment? -- Level 1 (Bedfast) - Unable to balance while sitting on edge of bed Level 1 (Bedfast) - Unable to balance while sitting on edge of bed     Is the above level different from baseline mobility prior to current illness? Yes - Recommend PT order -- --              Barriers to discharge: Disposition Plan: SNF HH orders placed: N/A Status is: Observation  Objective: Blood pressure 107/76, pulse 73, temperature 97.6 F (36.4 C), temperature source Oral, resp. rate 18, height 6' (1.829 m), weight 100.7 kg, SpO2 100%.  Examination:  Physical Exam Constitutional:      General: He is not in acute distress.    Appearance: Normal appearance.  HENT:     Head: Normocephalic and atraumatic.     Mouth/Throat:     Mouth: Mucous membranes are moist.  Eyes:     Extraocular Movements: Extraocular movements intact.  Cardiovascular:     Rate and Rhythm: Normal rate and regular rhythm.  Pulmonary:     Effort: Pulmonary effort is normal. No respiratory distress.     Breath sounds: Normal breath sounds. No wheezing.  Abdominal:     General: Bowel sounds are normal. There is no distension.     Palpations: Abdomen is soft.     Tenderness: There is no abdominal tenderness.  Musculoskeletal:        General: Normal range of motion.     Cervical back: Normal range of motion and neck supple.  Skin:    General: Skin is warm and dry.   Neurological:     Mental Status: He is alert.     Comments: Decreased strength in B/L LE  Psychiatric:        Mood and Affect: Mood normal.        Behavior: Behavior normal.      Consultants:  IR  Procedures:    Data Reviewed: Results for orders placed or performed during the hospital encounter of 04/09/24 (from the past 24 hours)  Glucose, capillary     Status: Abnormal   Collection Time: 04/12/24  4:49 PM  Result Value Ref Range   Glucose-Capillary 232 (H) 70 - 99 mg/dL  Glucose, capillary     Status: Abnormal   Collection Time: 04/12/24  9:24 PM  Result Value Ref Range   Glucose-Capillary 196 (H) 70 - 99 mg/dL  Comment 1 Notify RN   Protime-INR     Status: Abnormal   Collection Time: 04/13/24  5:08 AM  Result Value Ref Range   Prothrombin Time 15.5 (H) 11.4 - 15.2 seconds   INR 1.2 0.8 - 1.2  CBC     Status: Abnormal   Collection Time: 04/13/24  5:08 AM  Result Value Ref Range   WBC 7.1 4.0 - 10.5 K/uL   RBC 3.63 (L) 4.22 - 5.81 MIL/uL   Hemoglobin 11.6 (L) 13.0 - 17.0 g/dL   HCT 16.1 (L) 09.6 - 04.5 %   MCV 95.6 80.0 - 100.0 fL   MCH 32.0 26.0 - 34.0 pg   MCHC 33.4 30.0 - 36.0 g/dL   RDW 40.9 81.1 - 91.4 %   Platelets 225 150 - 400 K/uL   nRBC 0.0 0.0 - 0.2 %  Basic metabolic panel with GFR     Status: Abnormal   Collection Time: 04/13/24  5:08 AM  Result Value Ref Range   Sodium 137 135 - 145 mmol/L   Potassium 4.3 3.5 - 5.1 mmol/L   Chloride 108 98 - 111 mmol/L   CO2 20 (L) 22 - 32 mmol/L   Glucose, Bld 154 (H) 70 - 99 mg/dL   BUN 35 (H) 8 - 23 mg/dL   Creatinine, Ser 7.82 (H) 0.61 - 1.24 mg/dL   Calcium  8.9 8.9 - 10.3 mg/dL   GFR, Estimated 38 (L) >60 mL/min   Anion gap 9 5 - 15  Glucose, capillary     Status: Abnormal   Collection Time: 04/13/24  6:19 AM  Result Value Ref Range   Glucose-Capillary 158 (H) 70 - 99 mg/dL  Glucose, capillary     Status: Abnormal   Collection Time: 04/13/24 11:10 AM  Result Value Ref Range   Glucose-Capillary 277  (H) 70 - 99 mg/dL    I have reviewed pertinent nursing notes, vitals, labs, and images as necessary. I have ordered labwork to follow up on as indicated.  I have reviewed the last notes from staff over past 24 hours. I have discussed patient's care plan and test results with nursing staff, CM/SW, and other staff as appropriate.  Time spent: Greater than 50% of the 55 minute visit was spent in counseling/coordination of care for the patient as laid out in the A&P.   LOS: 0 days   Faith Homes, MD [facility] 04/13/2024, 4:24 PM

## 2024-04-13 NOTE — TOC Progression Note (Signed)
 Transition of Care [facility]) - Progression Note    Patient Details  Name: BRENDON CHRISTOFFEL MRN: [id number] Date of Birth: 08-Jul-1933  [facility]) CM/SW Contact  Tandy Fam, [place] Phone Number: 04/13/2024, 10:37 AM  Clinical Narrative:   CSW spoke with Admissions at [facility] to provide update on patient's status, should hopefully be ready to DC to SNF after procedure tomorrow. [facility] still has a bed available for patient if stable tomorrow. CSW to follow.    Expected Discharge Plan: Skilled Nursing Facility Barriers to Discharge: Continued Medical Work up  Expected Discharge Plan and Services                                               Social Determinants of Health (SDOH) Interventions SDOH Screenings   Food Insecurity: No Food Insecurity (04/11/2024)  Housing: Low Risk  (04/11/2024)  Transportation Needs: No Transportation Needs (04/11/2024)  Utilities: Not At Risk (04/11/2024)  Financial Resource Strain: Low Risk  (07/04/2022)  Social Connections: Moderately Integrated (04/11/2024)  Tobacco Use: Medium Risk (04/11/2024)    Readmission Risk Interventions    11/01/2021    4:47 PM  Readmission Risk Prevention Plan  Transportation Screening Complete  PCP or Specialist Appt within 3-5 Days Complete  HRI or Home Care Consult Complete  Social Work Consult for Recovery Care Planning/Counseling Complete  Palliative Care Screening Not Applicable  Medication Review Oceanographer) Complete

## 2024-04-13 NOTE — Plan of Care (Signed)
 Patient ID: Manuel Atkins, male   DOB: Jul 23, 1933, 89 y.o.   MRN: [id number]  Problem: Education: Goal: Ability to describe self-care measures that may prevent or decrease complications (Diabetes Survival Skills Education) will improve Outcome: Progressing Goal: Individualized Educational Video(s) Outcome: Progressing   Problem: Coping: Goal: Ability to adjust to condition or change in health will improve Outcome: Progressing   Problem: Fluid Volume: Goal: Ability to maintain a balanced intake and output will improve Outcome: Progressing   Problem: Health Behavior/Discharge Planning: Goal: Ability to identify and utilize available resources and services will improve Outcome: Progressing Goal: Ability to manage health-related needs will improve Outcome: Progressing   Problem: Metabolic: Goal: Ability to maintain appropriate glucose levels will improve Outcome: Progressing   Problem: Nutritional: Goal: Maintenance of adequate nutrition will improve Outcome: Progressing Goal: Progress toward achieving an optimal weight will improve Outcome: Progressing   Problem: Skin Integrity: Goal: Risk for impaired skin integrity will decrease Outcome: Progressing   Problem: Tissue Perfusion: Goal: Adequacy of tissue perfusion will improve Outcome: Progressing   Problem: Education: Goal: Knowledge of General Education information will improve Description: Including pain rating scale, medication(s)/side effects and non-pharmacologic comfort measures Outcome: Progressing   Problem: Health Behavior/Discharge Planning: Goal: Ability to manage health-related needs will improve Outcome: Progressing   Problem: Clinical Measurements: Goal: Ability to maintain clinical measurements within normal limits will improve Outcome: Progressing Goal: Will remain free from infection Outcome: Progressing Goal: Diagnostic test results will improve Outcome: Progressing Goal: Respiratory complications  will improve Outcome: Progressing Goal: Cardiovascular complication will be avoided Outcome: Progressing   Problem: Activity: Goal: Risk for activity intolerance will decrease Outcome: Progressing   Problem: Nutrition: Goal: Adequate nutrition will be maintained Outcome: Progressing   Problem: Coping: Goal: Level of anxiety will decrease Outcome: Progressing   Problem: Elimination: Goal: Will not experience complications related to bowel motility Outcome: Progressing Goal: Will not experience complications related to urinary retention Outcome: Progressing   Problem: Pain Managment: Goal: General experience of comfort will improve and/or be controlled Outcome: Progressing   Problem: Safety: Goal: Ability to remain free from injury will improve Outcome: Progressing   Problem: Skin Integrity: Goal: Risk for impaired skin integrity will decrease Outcome: Progressing    Genella Kendall, RN

## 2024-04-14 ENCOUNTER — Observation Stay (HOSPITAL_COMMUNITY)

## 2024-04-14 DIAGNOSIS — I129 Hypertensive chronic kidney disease with stage 1 through stage 4 chronic kidney disease, or unspecified chronic kidney disease: Secondary | ICD-10-CM | POA: Diagnosis not present

## 2024-04-14 DIAGNOSIS — K5190 Ulcerative colitis, unspecified, without complications: Secondary | ICD-10-CM | POA: Diagnosis not present

## 2024-04-14 DIAGNOSIS — E782 Mixed hyperlipidemia: Secondary | ICD-10-CM | POA: Diagnosis not present

## 2024-04-14 DIAGNOSIS — Z9181 History of falling: Secondary | ICD-10-CM | POA: Diagnosis not present

## 2024-04-14 DIAGNOSIS — J849 Interstitial pulmonary disease, unspecified: Secondary | ICD-10-CM | POA: Diagnosis not present

## 2024-04-14 DIAGNOSIS — Z86711 Personal history of pulmonary embolism: Secondary | ICD-10-CM

## 2024-04-14 DIAGNOSIS — Z950 Presence of cardiac pacemaker: Secondary | ICD-10-CM | POA: Diagnosis not present

## 2024-04-14 DIAGNOSIS — E1122 Type 2 diabetes mellitus with diabetic chronic kidney disease: Secondary | ICD-10-CM | POA: Diagnosis not present

## 2024-04-14 DIAGNOSIS — D649 Anemia, unspecified: Secondary | ICD-10-CM | POA: Diagnosis not present

## 2024-04-14 DIAGNOSIS — Z7984 Long term (current) use of oral hypoglycemic drugs: Secondary | ICD-10-CM | POA: Diagnosis not present

## 2024-04-14 DIAGNOSIS — M4856XA Collapsed vertebra, not elsewhere classified, lumbar region, initial encounter for fracture: Secondary | ICD-10-CM | POA: Diagnosis not present

## 2024-04-14 DIAGNOSIS — N1831 Chronic kidney disease, stage 3a: Secondary | ICD-10-CM | POA: Diagnosis not present

## 2024-04-14 DIAGNOSIS — Z87891 Personal history of nicotine dependence: Secondary | ICD-10-CM | POA: Diagnosis not present

## 2024-04-14 DIAGNOSIS — E1169 Type 2 diabetes mellitus with other specified complication: Secondary | ICD-10-CM | POA: Diagnosis not present

## 2024-04-14 DIAGNOSIS — Z79899 Other long term (current) drug therapy: Secondary | ICD-10-CM | POA: Diagnosis not present

## 2024-04-14 DIAGNOSIS — Z7901 Long term (current) use of anticoagulants: Secondary | ICD-10-CM | POA: Diagnosis not present

## 2024-04-14 DIAGNOSIS — R29890 Loss of height: Secondary | ICD-10-CM | POA: Diagnosis not present

## 2024-04-14 DIAGNOSIS — S0990XA Unspecified injury of head, initial encounter: Secondary | ICD-10-CM | POA: Diagnosis not present

## 2024-04-14 DIAGNOSIS — I4891 Unspecified atrial fibrillation: Secondary | ICD-10-CM | POA: Diagnosis not present

## 2024-04-14 DIAGNOSIS — G4733 Obstructive sleep apnea (adult) (pediatric): Secondary | ICD-10-CM | POA: Diagnosis not present

## 2024-04-14 DIAGNOSIS — S22000A Wedge compression fracture of unspecified thoracic vertebra, initial encounter for closed fracture: Secondary | ICD-10-CM | POA: Diagnosis not present

## 2024-04-14 HISTORY — PX: IR KYPHO LUMBAR INC FX REDUCE BONE BX UNI/BIL CANNULATION INC/IMAGING: IMG5519

## 2024-04-14 LAB — GLUCOSE, CAPILLARY
Glucose-Capillary: 156 mg/dL — ABNORMAL HIGH (ref 70–99)
Glucose-Capillary: 189 mg/dL — ABNORMAL HIGH (ref 70–99)
Glucose-Capillary: 162 mg/dL — ABNORMAL HIGH (ref 70–99)
Glucose-Capillary: 148 mg/dL — ABNORMAL HIGH (ref 70–99)

## 2024-04-14 MED ORDER — MIDAZOLAM HCL 2 MG/2ML IJ SOLN
INTRAMUSCULAR | Status: AC
Start: 2024-04-14 — End: ?
  Filled 2024-04-14: qty 2

## 2024-04-14 MED ORDER — BUPIVACAINE HCL (PF) 0.25 % IJ SOLN
INTRAMUSCULAR | Status: AC
  Filled 2024-04-14: qty 30

## 2024-04-14 MED ORDER — LIDOCAINE HCL 1 % IJ SOLN
INTRAMUSCULAR | Status: AC
  Filled 2024-04-14: qty 20

## 2024-04-14 MED ORDER — APIXABAN 2.5 MG PO TABS
5.0000 mg | ORAL_TABLET | Freq: Two times a day (BID) | ORAL | Status: DC
  Administered 2024-04-14 – 2024-04-15 (×2): 5 mg via ORAL
  Filled 2024-04-14 (×2): qty 2

## 2024-04-14 MED ORDER — BUPIVACAINE HCL (PF) 0.5 % IJ SOLN
INTRAMUSCULAR | Status: AC
  Filled 2024-04-14: qty 30

## 2024-04-14 MED ORDER — CEFAZOLIN SODIUM-DEXTROSE 2-4 GM/100ML-% IV SOLN
INTRAVENOUS | Status: AC
  Filled 2024-04-14: qty 100

## 2024-04-14 MED ORDER — FENTANYL CITRATE (PF) 100 MCG/2ML IJ SOLN
INTRAMUSCULAR | Status: AC
  Filled 2024-04-14: qty 2

## 2024-04-14 MED ORDER — BUPIVACAINE HCL (PF) 0.5 % IJ SOLN
30.0000 mL | Freq: Once | INTRAMUSCULAR | Status: AC
  Administered 2024-04-14: 30 mL

## 2024-04-14 MED ORDER — HYDROMORPHONE HCL 1 MG/ML IJ SOLN
INTRAMUSCULAR | Status: AC
  Filled 2024-04-14: qty 1

## 2024-04-14 MED ORDER — FENTANYL CITRATE (PF) 100 MCG/2ML IJ SOLN
INTRAMUSCULAR | Status: AC | PRN
  Administered 2024-04-14: 50 ug via INTRAVENOUS
  Administered 2024-04-14 (×2): 25 ug via INTRAVENOUS

## 2024-04-14 MED ORDER — HYDROMORPHONE HCL 1 MG/ML IJ SOLN
INTRAMUSCULAR | Status: AC | PRN
  Administered 2024-04-14: .5 mg via INTRAVENOUS

## 2024-04-14 MED ORDER — IOHEXOL 240 MG/ML SOLN
50.0000 mL | Freq: Once | INTRAMUSCULAR | Status: AC | PRN
  Administered 2024-04-14: 30 mL via INTRAVENOUS

## 2024-04-14 MED ORDER — TOBRAMYCIN SULFATE 1.2 G IJ SOLR
INTRAMUSCULAR | Status: AC
  Filled 2024-04-14: qty 1.2

## 2024-04-14 MED ORDER — BUPIVACAINE HCL (PF) 0.25 % IJ SOLN
30.0000 mL | Freq: Once | INTRAMUSCULAR | Status: AC
  Administered 2024-04-14: 10 mL
  Filled 2024-04-14: qty 30

## 2024-04-14 MED ORDER — TOBRAMYCIN SULFATE 1.2 G IJ SOLR
1.2000 g | INTRAMUSCULAR | Status: AC
  Administered 2024-04-14: 1.2 g via TOPICAL
  Filled 2024-04-14: qty 1.2

## 2024-04-14 MED ORDER — MIDAZOLAM HCL 2 MG/2ML IJ SOLN
INTRAMUSCULAR | Status: AC | PRN
  Administered 2024-04-14 (×2): 1 mg via INTRAVENOUS

## 2024-04-14 NOTE — Progress Notes (Signed)
 PT Cancellation Note  Patient Details Name: Manuel Atkins MRN: [id number] DOB: [birth year]   Cancelled Treatment:    Reason Eval/Treat Not Completed: Pain limiting ability to participate (pt declined due to high levels of pain related to recent kyphoplasty. Reports he will mobilize tomorrow. Will continue to follow up as able and appropriate.)  Sloan Duncans, DPT, CLT  Acute Rehabilitation Services Office: [phone] (Secure chat preferred)   Jenice Mitts 04/14/2024, 2:47 PM

## 2024-04-14 NOTE — Procedures (Signed)
 INR  Status post lumbar one  fluoroscopic guided balloon kyphoplasty.  Bipedicular approach.  No acute complications.  Patient tolerated the procedure well.  Jory Ng MD.

## 2024-04-14 NOTE — TOC Progression Note (Signed)
 Transition of Care [facility]) - Progression Note    Patient Details  Name: Manuel Atkins MRN: [id number] Date of Birth: May 25, 1933  Transition of Care [facility]) CM/SW Contact  Tandy Fam, [place] Phone Number: 04/14/2024, 12:42 PM  Clinical Narrative:   Patient had procedure today, MD to send tomorrow to SNF. Authorization expired yesterday, will need new auth for admit tomorrow. CSW requested CMA to initiate insurance authorization request. CSW updated [facility], they will have bed tomorrow. CSW to follow.    Expected Discharge Plan: Skilled Nursing Facility Barriers to Discharge: Continued Medical Work up  Expected Discharge Plan and Services                                               Social Determinants of Health (SDOH) Interventions SDOH Screenings   Food Insecurity: No Food Insecurity (04/11/2024)  Housing: Low Risk  (04/11/2024)  Transportation Needs: No Transportation Needs (04/11/2024)  Utilities: Not At Risk (04/11/2024)  Financial Resource Strain: Low Risk  (07/04/2022)  Social Connections: Moderately Integrated (04/11/2024)  Tobacco Use: Medium Risk (04/11/2024)    Readmission Risk Interventions    11/01/2021    4:47 PM  Readmission Risk Prevention Plan  Transportation Screening Complete  PCP or Specialist Appt within 3-5 Days Complete  HRI or Home Care Consult Complete  Social Work Consult for Recovery Care Planning/Counseling Complete  Palliative Care Screening Not Applicable  Medication Review Oceanographer) Complete

## 2024-04-14 NOTE — Discharge Instructions (Signed)
 1.  No stooping, bending or lifting weights above 10 pounds for 2 weeks.  2.  Use a walker to ambulate for 2 weeks.  3.  No driving for 2 weeks.  4.  Call referring MD in 2 weeks as needed.

## 2024-04-14 NOTE — Progress Notes (Signed)
 Progress Note    Manuel Atkins   HKV:[id number]  DOB: 1933-06-12  DOA: 04/09/2024     0 PCP: Leesa Pulling, MD  Initial CC: fall at home  Hospital Course: [name] is an 89 y.o. male with PMH HTN, ILD not on home O2, OSA on BiPAP, DM, UC no longer on disease specific treatment, CKD IIIa, hx esophageal stricture, fatty liver, hx PE in 2020, still on Eliquis  who presented after a fall at home. He underwent imaging on admission for back pain.  MRI T-spine showed T6 nondisplaced fracture and MRI L-spine showed acute/subacute compression fracture of L1 with approximately 30% height loss centrally. IR was consulted for consideration of kyphoplasty.  This was able to be performed on 04/14/2024.  Interval History:  Seen in room this afternoon after returning from kyphoplasty.  Wife present bedside.  Was exhausted and still having pain post procedure.  We discussed aiming for discharge tomorrow.  Assessment and Plan: * Compression fracture of body of L1 vertebra (HCC) - per MRI L spine on 04/11/24: "Acute/subacute compression fracture of L1 with approximately 30% height loss centrally. No retropulsion." - Eliquis  on hold - Appreciate IR evaluation.  Successfully underwent kyphoplasty on 04/14/2024 -Tentative plan for discharge to [facility] on Friday  History of pulmonary embolus (PE) History of segmental PE many years ago.  Pulmonology have kept him on Eliquis  due to his dyspnea and age. - okay to resume Eliquis  s/p kypoplasty  Class 1 obesity BMI 30.1 complicates care  CKD (chronic kidney disease), stage IIIa (HCC) Creatinine stable relative to baseline at 1.6  OSA (obstructive sleep apnea) - Continue home BiPAP  ILD (interstitial lung disease) (HCC) Mild, not on disease modifying treatment now on home oxygen   Anemia - stable from baseline ~ 11-12 g/dL  Ulcerative colitis (HCC) No longer on infliximab  as an outpatient.  Type 2 diabetes mellitus with hyperlipidemia  (HCC) Hemoglobin A1c 8.1% a year ago - Continue Jardiance, metformin , glipizide  - Start low-dose sliding scale insulin   Essential hypertension, benign - Continue amlodipine , carvedilol , losartan  - As needed hydralazine   Mixed hyperlipidemia - Continue Crestor    Old records reviewed in assessment of this patient  Antimicrobials:   DVT prophylaxis:  Eliquis    Code Status:   Code Status: Full Code  Mobility Assessment (Last 72 Hours)     Mobility Assessment     Row Name 04/13/24 20:07:04 04/13/24 1416 04/13/24 0825 04/12/24 2000 04/12/24 1500   Does patient have an order for bedrest or is patient medically unstable No - Continue assessment -- No - Continue assessment No - Continue assessment --   What is the highest level of mobility based on the progressive mobility assessment? Level 2 (Chairfast) - Balance while sitting on edge of bed and cannot stand Level 1 (Bedfast) - Unable to balance while sitting on edge of bed Level 1 (Bedfast) - Unable to balance while sitting on edge of bed Level 1 (Bedfast) - Unable to balance while sitting on edge of bed Level 3 (Stands with assist) - Balance while standing  and cannot march in place   Is the above level different from baseline mobility prior to current illness? Yes - Recommend PT order -- Yes - Recommend PT order Yes - Recommend PT order --    Row Name 04/12/24 0944 04/12/24 0600 04/12/24 0350 04/12/24 0015 04/11/24 2200   Does patient have an order for bedrest or is patient medically unstable No - Continue assessment No - Continue assessment No -  Continue assessment No - Continue assessment No - Continue assessment   What is the highest level of mobility based on the progressive mobility assessment? Level 1 (Bedfast) - Unable to balance while sitting on edge of bed Level 1 (Bedfast) - Unable to balance while sitting on edge of bed Level 1 (Bedfast) - Unable to balance while sitting on edge of bed Level 1 (Bedfast) - Unable to balance  while sitting on edge of bed Level 1 (Bedfast) - Unable to balance while sitting on edge of bed   Is the above level different from baseline mobility prior to current illness? Yes - Recommend PT order Yes - Recommend PT order Yes - Recommend PT order Yes - Recommend PT order Yes - Recommend PT order    Row Name 04/11/24 2000 04/11/24 1809 04/11/24 1800       Does patient have an order for bedrest or is patient medically unstable No - Continue assessment -- No - Continue assessment     What is the highest level of mobility based on the progressive mobility assessment? Level 1 (Bedfast) - Unable to balance while sitting on edge of bed -- Level 1 (Bedfast) - Unable to balance while sitting on edge of bed     Is the above level different from baseline mobility prior to current illness? Yes - Recommend PT order Yes - Recommend PT order --              Barriers to discharge: Disposition Plan: SNF HH orders placed: N/A Status is: Observation  Objective: Blood pressure (!) 140/86, pulse 84, temperature (!) 97.5 F (36.4 C), temperature source Oral, resp. rate 18, height 6' (1.829 m), weight 100.7 kg, SpO2 97%.  Examination:  Physical Exam Constitutional:      General: He is not in acute distress.    Appearance: Normal appearance.  HENT:     Head: Normocephalic and atraumatic.     Mouth/Throat:     Mouth: Mucous membranes are moist.  Eyes:     Extraocular Movements: Extraocular movements intact.  Cardiovascular:     Rate and Rhythm: Normal rate and regular rhythm.  Pulmonary:     Effort: Pulmonary effort is normal. No respiratory distress.     Breath sounds: Normal breath sounds. No wheezing.  Abdominal:     General: Bowel sounds are normal. There is no distension.     Palpations: Abdomen is soft.     Tenderness: There is no abdominal tenderness.  Musculoskeletal:        General: Normal range of motion.     Cervical back: Normal range of motion and neck supple.  Skin:     General: Skin is warm and dry.  Neurological:     Mental Status: He is alert.     Comments: Decreased strength in B/L LE  Psychiatric:        Mood and Affect: Mood normal.        Behavior: Behavior normal.      Consultants:  IR  Procedures:  04/14/2024: Kyphoplasty  Data Reviewed: Results for orders placed or performed during the hospital encounter of 04/09/24 (from the past 24 hours)  Glucose, capillary     Status: Abnormal   Collection Time: 04/13/24  5:09 PM  Result Value Ref Range   Glucose-Capillary 122 (H) 70 - 99 mg/dL  Surgical pcr screen     Status: None   Collection Time: 04/13/24  8:37 PM   Specimen: Nasal Mucosa; Nasal Swab  Result  Value Ref Range   MRSA, PCR NEGATIVE NEGATIVE   Staphylococcus aureus NEGATIVE NEGATIVE  Glucose, capillary     Status: Abnormal   Collection Time: 04/13/24  9:43 PM  Result Value Ref Range   Glucose-Capillary 223 (H) 70 - 99 mg/dL   Comment 1 Notify RN    Comment 2 Document in Chart   Glucose, capillary     Status: Abnormal   Collection Time: 04/14/24  6:56 AM  Result Value Ref Range   Glucose-Capillary 156 (H) 70 - 99 mg/dL   Comment 1 Notify RN   Glucose, capillary     Status: Abnormal   Collection Time: 04/14/24 12:27 PM  Result Value Ref Range   Glucose-Capillary 189 (H) 70 - 99 mg/dL   Comment 1 Notify RN     I have reviewed pertinent nursing notes, vitals, labs, and images as necessary. I have ordered labwork to follow up on as indicated.  I have reviewed the last notes from staff over past 24 hours. I have discussed patient's care plan and test results with nursing staff, CM/SW, and other staff as appropriate.  Time spent: Greater than 50% of the 55 minute visit was spent in counseling/coordination of care for the patient as laid out in the A&P.   LOS: 0 days   Faith Homes, MD [facility] 04/14/2024, 3:41 PM

## 2024-04-14 NOTE — Plan of Care (Signed)
 Patient acknowledge that he understand the severity of his fall and does not want to have a repeat. Problem: Education: Goal: Ability to describe self-care measures that may prevent or decrease complications (Diabetes Survival Skills Education) will improve Outcome: Progressing   Problem: Coping: Goal: Ability to adjust to condition or change in health will improve Outcome: Progressing   Problem: Education: Goal: Ability to describe self-care measures that may prevent or decrease complications (Diabetes Survival Skills Education) will improve Outcome: Progressing   Problem: Coping: Goal: Ability to adjust to condition or change in health will improve Outcome: Progressing

## 2024-04-15 DIAGNOSIS — D649 Anemia, unspecified: Secondary | ICD-10-CM | POA: Diagnosis not present

## 2024-04-15 DIAGNOSIS — Z9989 Dependence on other enabling machines and devices: Secondary | ICD-10-CM | POA: Diagnosis not present

## 2024-04-15 DIAGNOSIS — Z743 Need for continuous supervision: Secondary | ICD-10-CM | POA: Diagnosis not present

## 2024-04-15 DIAGNOSIS — N1831 Chronic kidney disease, stage 3a: Secondary | ICD-10-CM | POA: Diagnosis not present

## 2024-04-15 DIAGNOSIS — S32010D Wedge compression fracture of first lumbar vertebra, subsequent encounter for fracture with routine healing: Secondary | ICD-10-CM | POA: Diagnosis not present

## 2024-04-15 DIAGNOSIS — H409 Unspecified glaucoma: Secondary | ICD-10-CM | POA: Diagnosis not present

## 2024-04-15 DIAGNOSIS — G4733 Obstructive sleep apnea (adult) (pediatric): Secondary | ICD-10-CM | POA: Diagnosis not present

## 2024-04-15 DIAGNOSIS — Z7901 Long term (current) use of anticoagulants: Secondary | ICD-10-CM | POA: Diagnosis not present

## 2024-04-15 DIAGNOSIS — R54 Age-related physical debility: Secondary | ICD-10-CM | POA: Diagnosis not present

## 2024-04-15 DIAGNOSIS — K5190 Ulcerative colitis, unspecified, without complications: Secondary | ICD-10-CM | POA: Diagnosis not present

## 2024-04-15 DIAGNOSIS — E782 Mixed hyperlipidemia: Secondary | ICD-10-CM | POA: Diagnosis not present

## 2024-04-15 DIAGNOSIS — R296 Repeated falls: Secondary | ICD-10-CM | POA: Diagnosis not present

## 2024-04-15 DIAGNOSIS — Z87891 Personal history of nicotine dependence: Secondary | ICD-10-CM | POA: Diagnosis not present

## 2024-04-15 DIAGNOSIS — Z741 Need for assistance with personal care: Secondary | ICD-10-CM | POA: Diagnosis not present

## 2024-04-15 DIAGNOSIS — E1122 Type 2 diabetes mellitus with diabetic chronic kidney disease: Secondary | ICD-10-CM | POA: Diagnosis not present

## 2024-04-15 DIAGNOSIS — J849 Interstitial pulmonary disease, unspecified: Secondary | ICD-10-CM | POA: Diagnosis not present

## 2024-04-15 DIAGNOSIS — Z79899 Other long term (current) drug therapy: Secondary | ICD-10-CM | POA: Diagnosis not present

## 2024-04-15 DIAGNOSIS — Z7984 Long term (current) use of oral hypoglycemic drugs: Secondary | ICD-10-CM | POA: Diagnosis not present

## 2024-04-15 DIAGNOSIS — Z7401 Bed confinement status: Secondary | ICD-10-CM | POA: Diagnosis not present

## 2024-04-15 DIAGNOSIS — R29890 Loss of height: Secondary | ICD-10-CM | POA: Diagnosis not present

## 2024-04-15 DIAGNOSIS — Z86711 Personal history of pulmonary embolism: Secondary | ICD-10-CM | POA: Diagnosis not present

## 2024-04-15 DIAGNOSIS — S22000A Wedge compression fracture of unspecified thoracic vertebra, initial encounter for closed fracture: Secondary | ICD-10-CM | POA: Diagnosis not present

## 2024-04-15 DIAGNOSIS — Z950 Presence of cardiac pacemaker: Secondary | ICD-10-CM | POA: Diagnosis not present

## 2024-04-15 DIAGNOSIS — K219 Gastro-esophageal reflux disease without esophagitis: Secondary | ICD-10-CM | POA: Diagnosis not present

## 2024-04-15 DIAGNOSIS — R262 Difficulty in walking, not elsewhere classified: Secondary | ICD-10-CM | POA: Diagnosis not present

## 2024-04-15 DIAGNOSIS — Z9181 History of falling: Secondary | ICD-10-CM | POA: Diagnosis not present

## 2024-04-15 DIAGNOSIS — I4891 Unspecified atrial fibrillation: Secondary | ICD-10-CM | POA: Diagnosis not present

## 2024-04-15 DIAGNOSIS — S0990XA Unspecified injury of head, initial encounter: Secondary | ICD-10-CM | POA: Diagnosis not present

## 2024-04-15 DIAGNOSIS — M6281 Muscle weakness (generalized): Secondary | ICD-10-CM | POA: Diagnosis not present

## 2024-04-15 DIAGNOSIS — M4856XA Collapsed vertebra, not elsewhere classified, lumbar region, initial encounter for fracture: Secondary | ICD-10-CM | POA: Diagnosis not present

## 2024-04-15 DIAGNOSIS — I129 Hypertensive chronic kidney disease with stage 1 through stage 4 chronic kidney disease, or unspecified chronic kidney disease: Secondary | ICD-10-CM | POA: Diagnosis not present

## 2024-04-15 DIAGNOSIS — R531 Weakness: Secondary | ICD-10-CM | POA: Diagnosis not present

## 2024-04-15 DIAGNOSIS — E1165 Type 2 diabetes mellitus with hyperglycemia: Secondary | ICD-10-CM | POA: Diagnosis not present

## 2024-04-15 DIAGNOSIS — K5100 Ulcerative (chronic) pancolitis without complications: Secondary | ICD-10-CM | POA: Diagnosis not present

## 2024-04-15 DIAGNOSIS — E1169 Type 2 diabetes mellitus with other specified complication: Secondary | ICD-10-CM | POA: Diagnosis not present

## 2024-04-15 DIAGNOSIS — I10 Essential (primary) hypertension: Secondary | ICD-10-CM | POA: Diagnosis not present

## 2024-04-15 LAB — GLUCOSE, CAPILLARY
Glucose-Capillary: 138 mg/dL — ABNORMAL HIGH (ref 70–99)
Glucose-Capillary: 254 mg/dL — ABNORMAL HIGH (ref 70–99)

## 2024-04-15 MED ORDER — OXYCODONE HCL 5 MG PO TABS: 5.0000 mg | ORAL_TABLET | Freq: Four times a day (QID) | ORAL | 0 refills | Status: DC | PRN

## 2024-04-15 MED ORDER — ACETAMINOPHEN 500 MG PO TABS: 1000.0000 mg | ORAL_TABLET | Freq: Three times a day (TID) | ORAL | Status: AC

## 2024-04-15 MED ORDER — ALPRAZOLAM 0.25 MG PO TABS: 0.1250 mg | ORAL_TABLET | Freq: Three times a day (TID) | ORAL | 0 refills | Status: AC | PRN

## 2024-04-15 NOTE — Progress Notes (Signed)
 Physical Therapy Treatment Patient Details Name: Manuel Atkins MRN: [id number] DOB: October 26, 1933 Today's Date: 04/15/2024   History of Present Illness 89 y.o. male presents to [facility] 04/09/24 after losing balance when getting off the toilet and falling. No LOC or head injury. Stable L1 compression fx. Multiple prior falls with admit 5/24 for fall. PMHx: a-fib on eliquis , T2DM    PT Comments  Pt is progressing towards goals. Currently pt is Min-Mod A for bed mobility, 2 person Min A for sit to stand and 2 person assist for short distance gait. Pt is slightly self limiting due to fatigue with gait. Due to pt current functional status, home set up and available assistance at home recommending skilled physical therapy services < 3 hours/day in order to address strength, balance and functional mobility to decrease risk for falls, injury, immobility, skin break down and re-hospitalization.      If plan is discharge home, recommend the following: Assistance with cooking/housework;Help with stairs or ramp for entrance;Assist for transportation;A little help with walking and/or transfers   Can travel by private vehicle     No  Equipment Recommendations  None recommended by PT       Precautions / Restrictions Precautions Precautions: Fall;Back Precaution Booklet Issued: No Precaution/Restrictions Comments: s/p kyphoplasty Required Braces or Orthoses: Spinal Brace Spinal Brace: Lumbar corset Spinal Brace Comments: for comfort Restrictions Weight Bearing Restrictions Per Provider Order: No     Mobility  Bed Mobility Overal bed mobility: Needs Assistance Bed Mobility: Rolling, Sidelying to Sit Rolling: Min assist Sidelying to sit: Mod assist       General bed mobility comments: cues for bed mobility    Transfers Overall transfer level: Needs assistance Equipment used: Rolling walker (2 wheels) Transfers: Sit to/from Stand, Bed to chair/wheelchair/BSC Sit to Stand: Min assist, +2  safety/equipment   Step pivot transfers: Min assist, +2 physical assistance, +2 safety/equipment       General transfer comment: Min A with verbal cues for safe hand placement. Pt performed 2x during session. Short steps with low foot clearance with increased time and heavy reliance on RW with flexed posture for step pivot transfer and short distance gait.    Ambulation/Gait Ambulation/Gait assistance: Min assist, +2 safety/equipment Gait Distance (Feet): 10 Feet Assistive device: Rolling walker (2 wheels) Gait Pattern/deviations: Step-through pattern, Decreased step length - right, Decreased step length - left, Trunk flexed Gait velocity: decreased Gait velocity interpretation: <1.31 ft/sec, indicative of household ambulator   General Gait Details: Pt reports fatigue and wanted to sit after short distance gait. No shortness of breathe noted. Pt has short, flat foot initial contact steps with flexed posture despite multi modal cues for upright posture in order to decrease stress on surgical site.       Balance Overall balance assessment: History of Falls, Needs assistance Sitting-balance support: Bilateral upper extremity supported Sitting balance-Leahy Scale: Poor Sitting balance - Comments: intermittently pt requires close SBA to CGA due to instability in standing due to soreness in the R buttocks Postural control: Left lateral lean Standing balance support: Reliant on assistive device for balance, Bilateral upper extremity supported, During functional activity Standing balance-Leahy Scale: Fair          Hotel manager: Impaired Factors Affecting Communication: Hearing impaired  Cognition Arousal: Alert Behavior During Therapy: WFL for tasks assessed/performed   PT - Cognitive impairments: No apparent impairments     Following commands: Intact      Cueing Cueing Techniques: Verbal cues  General Comments General comments (skin  integrity, edema, etc.): No signs/symptoms of cardiac/respiratory distress. Dressing is dry and intact.      Pertinent Vitals/Pain Pain Assessment Pain Assessment: 0-10 Pain Score: 1  Pain Location: back Pain Descriptors / Indicators: Aching, Discomfort Pain Intervention(s): Monitored during session, Limited activity within patient's tolerance     PT Goals (current goals can now be found in the care plan section) Acute Rehab PT Goals Patient Stated Goal: to get stronger and prevent future falls PT Goal Formulation: With patient/family Time For Goal Achievement: 04/24/24 Potential to Achieve Goals: Good Progress towards PT goals: Progressing toward goals    Frequency    Min 2X/week      PT Plan  Continue with current POC     Co-evaluation PT/OT/SLP Co-Evaluation/Treatment: Yes Reason for Co-Treatment: Complexity of the patient's impairments (multi-system involvement);To address functional/ADL transfers PT goals addressed during session: Mobility/safety with mobility;Balance;Proper use of DME OT goals addressed during session: ADL's and self-care;Proper use of Adaptive equipment and DME;Strengthening/ROM      AM-PAC PT "6 Clicks" Mobility   Outcome Measure  Help needed turning from your back to your side while in a flat bed without using bedrails?: A Little Help needed moving from lying on your back to sitting on the side of a flat bed without using bedrails?: A Lot Help needed moving to and from a bed to a chair (including a wheelchair)?: A Lot Help needed standing up from a chair using your arms (e.g., wheelchair or bedside chair)?: A Lot Help needed to walk in hospital room?: A Lot Help needed climbing 3-5 steps with a railing? : Total 6 Click Score: 12    End of Session Equipment Utilized During Treatment: Gait belt Activity Tolerance: Patient tolerated treatment well Patient left: in chair;with call bell/phone within reach;with chair alarm set Nurse  Communication: Mobility status PT Visit Diagnosis: Unsteadiness on feet (R26.81);Other abnormalities of gait and mobility (R26.89);Muscle weakness (generalized) (M62.81);History of falling (Z91.81)     Time: 2952-8413 PT Time Calculation (min) (ACUTE ONLY): 23 min  Charges:    $Therapeutic Activity: 8-22 mins PT General Charges $$ ACUTE PT VISIT: 1 Visit                     Sloan Duncans, DPT, CLT  Acute Rehabilitation Services Office: [phone] (Secure chat preferred)    Jenice Mitts 04/15/2024, 10:52 AM

## 2024-04-15 NOTE — Plan of Care (Signed)
  Problem: Education: Goal: Ability to describe self-care measures that may prevent or decrease complications (Diabetes Survival Skills Education) will improve Outcome: Progressing   Problem: Health Behavior/Discharge Planning: Goal: Ability to manage health-related needs will improve Outcome: Progressing   Problem: Nutritional: Goal: Maintenance of adequate nutrition will improve Outcome: Progressing   Problem: Education: Goal: Knowledge of General Education information will improve Description: Including pain rating scale, medication(s)/side effects and non-pharmacologic comfort measures Outcome: Progressing   Problem: Clinical Measurements: Goal: Will remain free from infection Outcome: Progressing   Problem: Activity: Goal: Risk for activity intolerance will decrease Outcome: Progressing   Problem: Coping: Goal: Level of anxiety will decrease Outcome: Progressing   Problem: Pain Managment: Goal: General experience of comfort will improve and/or be controlled Outcome: Progressing   Problem: Safety: Goal: Ability to remain free from injury will improve Outcome: Progressing   Problem: Skin Integrity: Goal: Risk for impaired skin integrity will decrease Outcome: Progressing

## 2024-04-15 NOTE — Progress Notes (Signed)
 Occupational Therapy Treatment Patient Details Name: Manuel Atkins MRN: [id number] DOB: Nov 12, 1932 Today's Date: 04/15/2024   History of present illness 89 y.o. male presents to [facility] 04/09/24 after losing balance when getting off the toilet and falling. No LOC or head injury. Stable L1 compression fx. Multiple prior falls with admit 5/24 for fall. PMHx: a-fib on eliquis , T2DM   OT comments  Pt reporting lower levels of pain today, tolerating OOB mobility with min A +2 using RW support. Educated pt on typical back precautions to reduce strain on op site. He currently needs total A to setup A for ADLs, more assist with LBB/LBD. He demonstrates limited activity tolerance and was only able to ambulate 27ft at a time in room. OT to continue to progress pt as able and educate him on AE. Patient will benefit from continued inpatient follow up therapy, <3 hours/day       If plan is discharge home, recommend the following:  Two people to help with walking and/or transfers;A lot of help with bathing/dressing/bathroom;Assistance with cooking/housework;Direct supervision/assist for medications management;Direct supervision/assist for financial management;Assist for transportation;Help with stairs or ramp for entrance   Equipment Recommendations   (defer to next venue)    Recommendations for Other Services      Precautions / Restrictions Precautions Precautions: Fall;Back Precaution Booklet Issued: No (needs handout) Precaution/Restrictions Comments: s/p kyphoplasty Required Braces or Orthoses: Spinal Brace Spinal Brace: Lumbar corset Spinal Brace Comments: for comfort Restrictions Weight Bearing Restrictions Per Provider Order: No       Mobility Bed Mobility Overal bed mobility: Needs Assistance Bed Mobility: Rolling, Sidelying to Sit Rolling: Min assist Sidelying to sit: Mod assist       General bed mobility comments: cues for bed mobility, educated pt on log roll. Mod A to raise trunk into  upright position.    Transfers Overall transfer level: Needs assistance Equipment used: Rolling walker (2 wheels) Transfers: Sit to/from Stand, Bed to chair/wheelchair/BSC Sit to Stand: Min assist, +2 safety/equipment     Step pivot transfers: Min assist, +2 physical assistance, +2 safety/equipment     General transfer comment: Min A with verbal cues for safe hand placement. Pt performed 2x during session. Short steps with low foot clearance with increased time and heavy reliance on RW with flexed posture for step pivot transfer and short distance gait.     Balance Overall balance assessment: History of Falls, Needs assistance Sitting-balance support: Bilateral upper extremity supported Sitting balance-Leahy Scale: Poor Sitting balance - Comments: intermittently pt requires close SBA to CGA due to instability in standing due to soreness in the R buttocks Postural control: Left lateral lean Standing balance support: Reliant on assistive device for balance, Bilateral upper extremity supported, During functional activity Standing balance-Leahy Scale: Fair                             ADL either performed or assessed with clinical judgement   ADL       Grooming: Sitting;Wash/dry face;Set up   Upper Body Bathing: Sitting;Set up       Upper Body Dressing : Sitting;Set up   Lower Body Dressing: Sitting/lateral leans;Total assistance (unable to acheive figure four position.)   Toilet Transfer: Minimal assistance;+2 for safety/equipment;+2 for physical assistance           Functional mobility during ADLs: Minimal assistance;+2 for physical assistance;+2 for safety/equipment      Extremity/Trunk Assessment  Vision       Perception     Praxis     Communication Communication Communication: Impaired Factors Affecting Communication: Hearing impaired   Cognition Arousal: Alert Behavior During Therapy: WFL for tasks  assessed/performed Cognition: No apparent impairments                               Following commands: Intact        Cueing   Cueing Techniques: Verbal cues  Exercises      Shoulder Instructions       General Comments dressing c/d/i    Pertinent Vitals/ Pain       Pain Assessment Pain Assessment: 0-10 Pain Score: 1  Pain Location: back Pain Descriptors / Indicators: Aching, Discomfort Pain Intervention(s): Monitored during session  Home Living                                          Prior Functioning/Environment              Frequency  Min 2X/week        Progress Toward Goals  OT Goals(current goals can now be found in the care plan section)  Progress towards OT goals: Progressing toward goals  Acute Rehab OT Goals OT Goal Formulation: With patient Time For Goal Achievement: 04/25/24 Potential to Achieve Goals: Good ADL Goals Pt Will Perform Lower Body Dressing: with supervision;sitting/lateral leans;with adaptive equipment  Plan      Co-evaluation    PT/OT/SLP Co-Evaluation/Treatment: Yes Reason for Co-Treatment: Complexity of the patient's impairments (multi-system involvement);To address functional/ADL transfers PT goals addressed during session: Mobility/safety with mobility;Balance;Proper use of DME OT goals addressed during session: ADL's and self-care;Proper use of Adaptive equipment and DME;Strengthening/ROM      AM-PAC OT "6 Clicks" Daily Activity     Outcome Measure   Help from another person eating meals?: None Help from another person taking care of personal grooming?: A Little Help from another person toileting, which includes using toliet, bedpan, or urinal?: A Lot Help from another person bathing (including washing, rinsing, drying)?: A Lot Help from another person to put on and taking off regular upper body clothing?: A Little Help from another person to put on and taking off regular lower body  clothing?: Total 6 Click Score: 15    End of Session Equipment Utilized During Treatment: Gait belt;Rolling walker (2 wheels)  OT Visit Diagnosis: Unsteadiness on feet (R26.81);Other abnormalities of gait and mobility (R26.89);Muscle weakness (generalized) (M62.81);History of falling (Z91.81)   Activity Tolerance Patient tolerated treatment well   Patient Left in chair;with call bell/phone within reach;with chair alarm set   Nurse Communication Mobility status        Time: 1610-9604 OT Time Calculation (min): 19 min  Charges: OT General Charges $OT Visit: 1 Visit OT Treatments $Therapeutic Activity: 8-22 mins  04/15/2024  AB, OTR/L  Acute Rehabilitation Services  Office: [phone]   Jorene New 04/15/2024, 11:08 AM

## 2024-04-15 NOTE — Progress Notes (Signed)
 Report called to [facility] RN at [facility]

## 2024-04-15 NOTE — Discharge Summary (Signed)
 Physician Discharge Summary   CHEE KINSLOW ZOX:[id number] DOB: 1933/07/04 DOA: 04/09/2024  PCP: Leesa Pulling, MD  Admit date: 04/09/2024 Discharge date: 04/15/2024   Admitted From: Home Disposition:  SNF Discharging physician: Faith Homes, MD Barriers to discharge: none   Discharge Condition: stable CODE STATUS: Full Diet recommendation:  Diet Orders (From admission, onward)     Start     Ordered   04/15/24 0000  Diet Carb Modified        04/15/24 1116   04/14/24 1235  Diet Carb Modified Fluid consistency: Thin; Room service appropriate? Yes  Diet effective now       Question Answer Comment  Diet-HS Snack? Nothing   Calorie Level Medium 1600-2000   Fluid consistency: Thin   Room service appropriate? Yes      04/14/24 1234            Hospital Course: Mr. Funari is an 89 y.o. male with PMH HTN, ILD not on home O2, OSA on BiPAP, DM, UC no longer on disease specific treatment, CKD IIIa, hx esophageal stricture, fatty liver, hx PE in 2020, still on Eliquis  who presented after a fall at home. He underwent imaging on admission for back pain.  MRI T-spine showed T6 nondisplaced fracture and MRI L-spine showed acute/subacute compression fracture of L1 with approximately 30% height loss centrally. IR was consulted for consideration of kyphoplasty.  This was able to be performed on 04/14/2024.  Assessment and Plan: * Compression fracture of body of L1 vertebra (HCC) - per MRI L spine on 04/11/24: "Acute/subacute compression fracture of L1 with approximately 30% height loss centrally. No retropulsion." - Eliquis  resumed post procedure - Appreciate IR evaluation.  Successfully underwent kyphoplasty on 04/14/2024 - discharge to [facility]  History of pulmonary embolus (PE) History of segmental PE many years ago.  Pulmonology have kept him on Eliquis  due to his dyspnea and age. - okay to resume Eliquis  s/p kypoplasty  Class 1 obesity BMI 30.1 complicates care  CKD (chronic  kidney disease), stage IIIa (HCC) Creatinine stable relative to baseline at 1.6  OSA (obstructive sleep apnea) - Continue home BiPAP  ILD (interstitial lung disease) (HCC) Mild, not on disease modifying treatment now on home oxygen   Anemia - stable from baseline ~ 11-12 g/dL  Ulcerative colitis (HCC) No longer on infliximab  as an outpatient.  Type 2 diabetes mellitus with hyperlipidemia (HCC) Hemoglobin A1c 8.1% a year ago - Continue Jardiance, metformin , glipizide   Essential hypertension, benign - Continue amlodipine , carvedilol , losartan   Mixed hyperlipidemia - Continue Crestor      Principal Diagnosis: Compression fracture of body of thoracic vertebra [facility])  Discharge Diagnoses: Active Hospital Problems   Diagnosis Date Noted   Compression fracture of body of L1 vertebra (HCC) 04/11/2024    Priority: 1.   History of pulmonary embolus (PE) 09/26/2019    Priority: 2.   Class 1 obesity 04/12/2024   CKD (chronic kidney disease), stage IIIa (HCC) 04/11/2024   OSA (obstructive sleep apnea) 06/23/2019   ILD (interstitial lung disease) (HCC) 01/15/2018   Anemia 11/10/2012   Ulcerative colitis (HCC) 11/08/2012   Type 2 diabetes mellitus with hyperlipidemia (HCC) 08/20/2011   Essential hypertension, benign    Mixed hyperlipidemia     Resolved Hospital Problems  No resolved problems to display.     Discharge Instructions     Diet Carb Modified   Complete by: As directed    Increase activity slowly   Complete by: As directed    No  wound care   Complete by: As directed       Allergies as of 04/15/2024       Reactions   Purixan  [mercaptopurine ] Other (See Comments)   High Fever Chill Fatigue        Medication List     STOP taking these medications    lidocaine  5 % Commonly known as: Lidoderm    Trospium  Chloride 60 MG Cp24       TAKE these medications    acetaminophen  500 MG tablet Commonly known as: TYLENOL  Take 2 tablets (1,000 mg total) by  mouth 3 (three) times daily. What changed:  when to take this reasons to take this   albuterol  108 (90 Base) MCG/ACT inhaler Commonly known as: VENTOLIN  HFA Inhale 2 puffs into the lungs every 6 (six) hours as needed for wheezing or shortness of breath.   ALPRAZolam  0.25 MG tablet Commonly known as: XANAX  Take 0.5 tablets (0.125 mg total) by mouth 3 (three) times daily as needed for anxiety. for anxiety   amLODipine  5 MG tablet Commonly known as: NORVASC  Take 1 tablet (5 mg total) by mouth daily.   carvedilol  3.125 MG tablet Commonly known as: Coreg  Take 1 tablet (3.125 mg total) by mouth 2 (two) times daily.   cetirizine 10 MG tablet Commonly known as: ZYRTEC Take 10 mg by mouth daily as needed for allergies.   Eliquis  5 MG Tabs tablet Generic drug: apixaban  TAKE 1 TABLET BY MOUTH TWICE DAILY   finasteride  5 MG tablet Commonly known as: PROSCAR  Take 1 tablet (5 mg total) by mouth daily.   glipiZIDE  10 MG 24 hr tablet Commonly known as: GLUCOTROL  XL Take 10 mg by mouth daily with breakfast.   imipramine  25 MG tablet Commonly known as: TOFRANIL  Take 75 mg by mouth at bedtime.   losartan  25 MG tablet Commonly known as: COZAAR  Take 1 tablet (25 mg total) by mouth daily.   Lumigan 0.01 % Soln Generic drug: bimatoprost Place 1 drop into both eyes at bedtime.   MENS 50+ MULTIVITAMIN PO Take 1 tablet by mouth daily.   Myrbetriq  25 MG Tb24 tablet Generic drug: mirabegron  ER Take 25 mg by mouth daily.   oxyCODONE 5 MG immediate release tablet Commonly known as: Oxy IR/ROXICODONE Take 1 tablet (5 mg total) by mouth every 6 (six) hours as needed for severe pain (pain score 7-10).   pantoprazole  40 MG tablet Commonly known as: PROTONIX  TAKE ONE TABLET BY MOUTH EVERY OTHER DAY What changed:  when to take this additional instructions   rosuvastatin  10 MG tablet Commonly known as: CRESTOR  Take 10 mg by mouth at bedtime.   sodium bicarbonate  650 MG tablet Take  0.5 tablets (325 mg total) by mouth 2 (two) times daily.   Synjardy XR 12.03-999 MG Tb24 Generic drug: Empagliflozin-metFORMIN  HCl ER Take 2 tablets by mouth daily.   tamsulosin  0.4 MG Caps capsule Commonly known as: FLOMAX  Take 1 capsule (0.4 mg total) by mouth in the morning and at bedtime.   VITAMIN D-3 PO Take 1 capsule by mouth daily.        Contact information for after-discharge care     Destination     [facility] Preferred SNF .   Service: Skilled Nursing Contact information: [address] [place]   [address] [phone]                    Allergies  Allergen Reactions   Purixan  [Mercaptopurine ] Other (See Comments)  High Fever Chill Fatigue    Consultations: IR  Procedures: 6/5: Kyphoplasty, L1  Discharge Exam: BP (!) 173/83 (BP Location: Left Arm)   Pulse 90   Temp 97.7 F (36.5 C) (Oral)   Resp 18   Ht 6' (1.829 m)   Wt 100.7 kg   SpO2 96%   BMI 30.11 kg/m  Physical Exam Constitutional:      General: He is not in acute distress.    Appearance: Normal appearance.  HENT:     Head: Normocephalic and atraumatic.     Mouth/Throat:     Mouth: Mucous membranes are moist.  Eyes:     Extraocular Movements: Extraocular movements intact.  Cardiovascular:     Rate and Rhythm: Normal rate and regular rhythm.  Pulmonary:     Effort: Pulmonary effort is normal. No respiratory distress.     Breath sounds: Normal breath sounds. No wheezing.  Abdominal:     General: Bowel sounds are normal. There is no distension.     Palpations: Abdomen is soft.     Tenderness: There is no abdominal tenderness.  Musculoskeletal:        General: Normal range of motion.     Cervical back: Normal range of motion and neck supple.  Skin:    General: Skin is warm and dry.  Neurological:     Mental Status: He is alert.     Comments: Decreased strength in B/L LE  Psychiatric:        Mood and Affect: Mood normal.         Behavior: Behavior normal.      The results of significant diagnostics from this hospitalization (including imaging, microbiology, ancillary and laboratory) are listed below for reference.   Microbiology: Recent Results (from the past 240 hours)  Surgical pcr screen     Status: None   Collection Time: 04/13/24  8:37 PM   Specimen: Nasal Mucosa; Nasal Swab  Result Value Ref Range Status   MRSA, PCR NEGATIVE NEGATIVE Final   Staphylococcus aureus NEGATIVE NEGATIVE Final    Comment: (NOTE) The Xpert SA Assay (FDA approved for NASAL specimens in patients 61 years of age and older), is one component of a comprehensive surveillance program. It is not intended to diagnose infection nor to guide or monitor treatment. Performed at [facility], [address], [place], [place] [address]      Labs: BNP (last 3 results) No results for input(s): "BNP" in the last 8760 hours. Basic Metabolic Panel: Recent Labs  Lab 04/09/24 2204 04/12/24 0653 04/13/24 0508  NA 138 139 137  K 4.7 4.3 4.3  CL 105 105 108  CO2 21* 22 20*  GLUCOSE 214* 181* 154*  BUN 34* 34* 35*  CREATININE 1.59* 1.67* 1.72*  CALCIUM  9.1 9.1 8.9   Liver Function Tests: No results for input(s): "AST", "ALT", "ALKPHOS", "BILITOT", "PROT", "ALBUMIN" in the last 168 hours. No results for input(s): "LIPASE", "AMYLASE" in the last 168 hours. No results for input(s): "AMMONIA" in the last 168 hours. CBC: Recent Labs  Lab 04/09/24 2204 04/12/24 0653 04/13/24 0508  WBC 8.7 8.6 7.1  NEUTROABS 6.3  --   --   HGB 11.3* 12.6* 11.6*  HCT 33.9* 37.5* 34.7*  MCV 95.8 95.9 95.6  PLT 238 258 225   Cardiac Enzymes: No results for input(s): "CKTOTAL", "CKMB", "CKMBINDEX", "TROPONINI" in the last 168 hours. BNP: Invalid input(s): "POCBNP" CBG: Recent Labs  Lab 04/14/24 0656 04/14/24 1227 04/14/24 1554 04/14/24  2148 04/15/24 0617  GLUCAP 156* 189* 162* 148* 138*   D-Dimer No results for input(s): "DDIMER" in  the last 72 hours. Hgb A1c No results for input(s): "HGBA1C" in the last 72 hours. Lipid Profile No results for input(s): "CHOL", "HDL", "LDLCALC", "TRIG", "CHOLHDL", "LDLDIRECT" in the last 72 hours. Thyroid  function studies No results for input(s): "TSH", "T4TOTAL", "T3FREE", "THYROIDAB" in the last 72 hours.  Invalid input(s): "FREET3" Anemia work up No results for input(s): "VITAMINB12", "FOLATE", "FERRITIN", "TIBC", "IRON", "RETICCTPCT" in the last 72 hours. Urinalysis    Component Value Date/Time   COLORURINE YELLOW 11/08/2012 0029   APPEARANCEUR Clear 02/03/2024 1611   LABSPEC 1.025 11/08/2012 0029   PHURINE 6.0 11/08/2012 0029   GLUCOSEU 3+ (A) 02/03/2024 1611   HGBUR TRACE (A) 11/08/2012 0029   BILIRUBINUR Negative 02/03/2024 1611   KETONESUR 15 (A) 11/08/2012 0029   PROTEINUR Trace (A) 02/03/2024 1611   PROTEINUR NEGATIVE 11/08/2012 0029   UROBILINOGEN 1.0 08/22/2022 0919   UROBILINOGEN 0.2 11/08/2012 0029   NITRITE Negative 02/03/2024 1611   NITRITE NEGATIVE 11/08/2012 0029   LEUKOCYTESUR Negative 02/03/2024 1611   Sepsis Labs Recent Labs  Lab 04/09/24 2204 04/12/24 0653 04/13/24 0508  WBC 8.7 8.6 7.1   Microbiology Recent Results (from the past 240 hours)  Surgical pcr screen     Status: None   Collection Time: 04/13/24  8:37 PM   Specimen: Nasal Mucosa; Nasal Swab  Result Value Ref Range Status   MRSA, PCR NEGATIVE NEGATIVE Final   Staphylococcus aureus NEGATIVE NEGATIVE Final    Comment: (NOTE) The Xpert SA Assay (FDA approved for NASAL specimens in patients 41 years of age and older), is one component of a comprehensive surveillance program. It is not intended to diagnose infection nor to guide or monitor treatment. Performed at [facility], [address] [place], [place] [address]     Procedures/Studies: MR THORACIC SPINE W WO CONTRAST Result Date: 04/11/2024 CLINICAL DATA:  Patient presenting with recurrent falls and intractable back  pain. Fall 1 week ago, unable to consistently stand or walk due to pain. EXAM: MRI THORACIC WITHOUT AND WITH CONTRAST TECHNIQUE: Multiplanar and multiecho pulse sequences of the thoracic spine were obtained without and with intravenous contrast. CONTRAST:  9.74mL GADAVIST GADOBUTROL 1 MMOL/ML IV SOLN COMPARISON:  Same day MRI lumbar spine. CT chest abdomen pelvis 04/09/2024. FINDINGS: Alignment: Thoracic kyphosis is maintained. No significant listhesis. Vertebrae: Edema and enhancement within the T6 vertebral body most pronounced anteriorly which may reflect nondisplaced fracture. There is a small focus of edema extending through a central defect in the anterior cortex on sagittal STIR images which correlates with a small lucency on CT. No significant height loss of T6. Chronic appearing irregularity of the T2 superior endplate with associated Modic type 2 degenerative plate changes. No retropulsion. Heterogeneous bone marrow signal intensity. Modic type 2 degenerative endplate changes at C7-T1. Cord:  Normal signal and morphology. Paraspinal and other soft tissues: The paraspinal soft tissues are unremarkable. Disc levels: Intervertebral disc heights are relatively maintained. There is no large disc herniation. No high-grade spinal canal stenosis. There is a small disc bulge eccentric to the left at T7-8 which indents the ventral thecal sac without contacting the spinal cord. Epidural lipomatosis in the midthoracic spine. Facet arthrosis at multiple levels. No high-grade foraminal stenosis. IMPRESSION: Edema within the anterior inferior aspect of the T6 vertebra with possible defect in the anterior cortex concerning for nondisplaced fracture. No significant height loss or retropulsion.  No high-grade spinal canal or foraminal stenosis. Electronically Signed   By: Denny Flack M.D.   On: 04/11/2024 20:40   MR Lumbar Spine W Wo Contrast Result Date: 04/11/2024 CLINICAL DATA:  Lower back pain, concern for infection.  EXAM: MRI LUMBAR SPINE WITHOUT AND WITH CONTRAST TECHNIQUE: Multiplanar and multiecho pulse sequences of the lumbar spine were obtained without and with intravenous contrast. CONTRAST:  9.4mL GADAVIST GADOBUTROL 1 MMOL/ML IV SOLN COMPARISON:  CT chest abdomen pelvis 04/09/2024 and CT renal stone 04/16/2023. Lumbar spine radiograph 04/02/2024. FINDINGS: Segmentation:  Standard. Alignment: Lumbar lordosis is maintained. No significant listhesis. Vertebrae: Diffuse edema and enhancement throughout the L1 vertebral body with irregularity of the superior endplate. There is up to 30% height loss of the S1 vertebra centrally without significant retropulsion. Schmorl's node involving the L3 superior endplate. Vertebral body heights otherwise maintained. Heterogeneous bone marrow signal intensity without suspicious osseous lesion demonstrated. Conus medullaris and cauda equina: Conus extends to the T12-L1 level. Conus and cauda equina appear normal. Paraspinal and other soft tissues: The paraspinal soft tissues are unremarkable. Disc levels: T12-L1: No significant disc bulge. No significant spinal canal stenosis or foraminal stenosis. L1-2: Disc desiccation. Minimal disc bulge. No significant spinal canal stenosis. Mild facet arthrosis. No significant foraminal stenosis. L2-3: Disc desiccation. Diffuse disc bulge and left subarticular disc protrusion resulting in lateral recess narrowing. There is possible impingement upon the traversing nerve roots, left greater than right. Mild facet arthrosis indents the dorsal thecal sac. No significant spinal canal stenosis. No significant foraminal stenosis. L3-4: Disc desiccation. Diffuse disc bulge resulting in mild lateral recess narrowing without significant impingement upon the traversing nerve roots. Mild facet arthrosis. No significant spinal canal or foraminal stenosis. L4-5: Disc desiccation. Diffuse disc bulge. No significant spinal canal stenosis. Mild facet arthrosis. No  significant foraminal stenosis. L5-S1: Disc desiccation. Small central disc protrusion. No significant spinal canal stenosis. Bilateral facet arthrosis. No significant foraminal stenosis. IMPRESSION: Acute/subacute compression fracture of L1 with approximately 30% height loss centrally. No retropulsion. No suspicious osseous lesion. No findings to suggest discitis/osteomyelitis. Degenerative changes as above. Disc bulge and left subarticular disc protrusion at L2-3 resulting in lateral recess narrowing with possible impingement upon the traversing nerve roots, left greater than right. Electronically Signed   By: Denny Flack M.D.   On: 04/11/2024 20:06   CT CHEST ABDOMEN PELVIS WO CONTRAST Result Date: 04/09/2024 CLINICAL DATA:  Trauma EXAM: CT CHEST, ABDOMEN AND PELVIS WITHOUT CONTRAST TECHNIQUE: Multidetector CT imaging of the chest, abdomen and pelvis was performed following the standard protocol without IV contrast. RADIATION DOSE REDUCTION: This exam was performed according to the departmental dose-optimization program which includes automated exposure control, adjustment of the mA and/or kV according to patient size and/or use of iterative reconstruction technique. COMPARISON:  Chest CT 02/17/2023. CT renal stone 06/23/2022. Lumbar spine x-ray 04/02/2024. FINDINGS: CT CHEST FINDINGS Cardiovascular: The heart is mildly enlarged. There is no pericardial effusion. Left-sided pacemaker is present. There are atherosclerotic calcifications of the aorta. The aorta is normal in size. Mediastinum/Nodes: No enlarged mediastinal, hilar, or axillary lymph nodes. Thyroid  gland, trachea, and esophagus demonstrate no significant findings. Lungs/Pleura: Bronchiectasis in the bilateral lower lobes appears unchanged. Peripheral interstitial opacities are scattered throughout both lungs, unchanged from the prior examination. There is no new focal lung infiltrate, pleural effusion or pneumothorax. Musculoskeletal: No chest  wall mass or suspicious bone lesions identified. CT ABDOMEN PELVIS FINDINGS Hepatobiliary: No focal liver abnormality is seen. Status post cholecystectomy. No biliary dilatation. Pancreas:  Unremarkable. No pancreatic ductal dilatation or surrounding inflammatory changes. Spleen: Normal in size without focal abnormality. Adrenals/Urinary Tract: There is a punctate nonobstructing calculus in the lower pole left kidney. Otherwise, the adrenal glands, kidneys and bladder are within normal limits. Stomach/Bowel: Stomach is within normal limits. Appendix appears normal. No evidence of bowel wall thickening, distention, or inflammatory changes. Colonic diverticulosis present. Vascular/Lymphatic: Aortic atherosclerosis. No enlarged abdominal or pelvic lymph nodes. Reproductive: Post PICC line is enlarged. Other: There is a small fat containing right inguinal hernia. No ascites. Musculoskeletal: There is subcutaneous edema lateral to the right hip. There is mild compression fracture of the superior endplate of L1 which has progressed compared to 04/02/2024. There are healed left superior and inferior pubic rami fractures. Degenerative changes affect the spine and hips. IMPRESSION: 1. Subcutaneous edema lateral to the right hip compatible with soft tissue contusion. 2. Mild compression fracture of the superior endplate of L1 has progressed compared to 04/02/2024. Correlate for point tenderness. 3. No other acute posttraumatic sequelae in the chest, abdomen or pelvis. 4. Stable bronchiectasis and interstitial opacities in the lungs. 5. Nonobstructing left renal calculus. 6. Colonic diverticulosis. 7. Aortic atherosclerosis. Aortic Atherosclerosis (ICD10-I70.0). Electronically Signed   By: Tyron Gallon M.D.   On: 04/09/2024 22:46   CT Head Wo Contrast Result Date: 04/09/2024 EXAM: CT HEAD AND CERVICAL SPINE 04/09/2024 10:30:00 PM TECHNIQUE: CT of the head and cervical spine was performed without the administration of  intravenous contrast. Multiplanar reformatted images are provided for review. Automated exposure control, iterative reconstruction, and/or weight based adjustment of the mA/kV was utilized to reduce the radiation dose to as low as reasonably achievable. COMPARISON: CT head dated 04/02/2024 and CT cervical spine dated 07/17/2021. CLINICAL HISTORY: FINDINGS: HEAD: BRAIN AND VENTRICLES: There is no acute intracranial hemorrhage, mass effect or midline shift. No abnormal extra-axial fluid collection. The gray-white differentiation is maintained without evidence of an acute infarct. There is no evidence of hydrocephalus. Global cortical atrophy. Subcortical and periventricular small vessel ischemic changes. ORBITS: The visualized portion of the orbits demonstrate no acute abnormality. SINUSES: The visualized paranasal sinuses and mastoid air cells demonstrate no acute abnormality. SOFT TISSUES AND SKULL: No acute abnormality of the visualized skull or soft tissues. CERVICAL SPINE: BONES AND ALIGNMENT: There is no acute fracture or traumatic malalignment. DEGENERATIVE CHANGES: Mild degenerative changes of the mid / lower cervical spine. SOFT TISSUES: There is no prevertebral soft tissue swelling. VASCULATURE: Intracranial atherosclerosis. IMPRESSION: 1. No acute intracranial abnormality. 2. Mild degenerative changes of the mid/lower cervical spine. Electronically signed by: Zadie Herter MD 04/09/2024 10:40 PM EDT RP Workstation: [id number]   CT Cervical Spine Wo Contrast Result Date: 04/09/2024 EXAM: CT HEAD AND CERVICAL SPINE 04/09/2024 10:30:00 PM TECHNIQUE: CT of the head and cervical spine was performed without the administration of intravenous contrast. Multiplanar reformatted images are provided for review. Automated exposure control, iterative reconstruction, and/or weight based adjustment of the mA/kV was utilized to reduce the radiation dose to as low as reasonably achievable. COMPARISON: CT head dated  04/02/2024 and CT cervical spine dated 07/17/2021. CLINICAL HISTORY: FINDINGS: HEAD: BRAIN AND VENTRICLES: There is no acute intracranial hemorrhage, mass effect or midline shift. No abnormal extra-axial fluid collection. The gray-white differentiation is maintained without evidence of an acute infarct. There is no evidence of hydrocephalus. Global cortical atrophy. Subcortical and periventricular small vessel ischemic changes. ORBITS: The visualized portion of the orbits demonstrate no acute abnormality. SINUSES: The visualized paranasal sinuses and mastoid air cells demonstrate no acute  abnormality. SOFT TISSUES AND SKULL: No acute abnormality of the visualized skull or soft tissues. CERVICAL SPINE: BONES AND ALIGNMENT: There is no acute fracture or traumatic malalignment. DEGENERATIVE CHANGES: Mild degenerative changes of the mid / lower cervical spine. SOFT TISSUES: There is no prevertebral soft tissue swelling. VASCULATURE: Intracranial atherosclerosis. IMPRESSION: 1. No acute intracranial abnormality. 2. Mild degenerative changes of the mid/lower cervical spine. Electronically signed by: Zadie Herter MD 04/09/2024 10:40 PM EDT RP Workstation: [id number]   DG Thoracic Spine 2 View Result Date: 04/03/2024 CLINICAL DATA:  Fall, mid lower back pain EXAM: THORACIC SPINE 2 VIEWS; LUMBAR SPINE - COMPLETE 4+ VIEW COMPARISON:  CT lumbar spine 05/12/2021; CT chest 02/17/2023; CT abdomen pelvis 04/16/2023 FINDINGS: No evidence of acute fracture or traumatic listhesis in the thoracolumbar spine. Multilevel spondylosis in the thoracic spine with bridging anterior osteophytes. Mild spondylosis in the lumbar spine. Moderate facet arthropathy at L4-L5 and L5-S1. IMPRESSION: 1. No acute fracture or traumatic listhesis in the thoracolumbar spine. Electronically Signed   By: Rozell Cornet M.D.   On: 04/03/2024 00:02   DG Lumbar Spine Complete Result Date: 04/03/2024 CLINICAL DATA:  Fall, mid lower back pain EXAM:  THORACIC SPINE 2 VIEWS; LUMBAR SPINE - COMPLETE 4+ VIEW COMPARISON:  CT lumbar spine 05/12/2021; CT chest 02/17/2023; CT abdomen pelvis 04/16/2023 FINDINGS: No evidence of acute fracture or traumatic listhesis in the thoracolumbar spine. Multilevel spondylosis in the thoracic spine with bridging anterior osteophytes. Mild spondylosis in the lumbar spine. Moderate facet arthropathy at L4-L5 and L5-S1. IMPRESSION: 1. No acute fracture or traumatic listhesis in the thoracolumbar spine. Electronically Signed   By: Rozell Cornet M.D.   On: 04/03/2024 00:02   CT Head Wo Contrast Result Date: 04/02/2024 CLINICAL DATA:  Head trauma, minor (Age >= 65y) EXAM: CT HEAD WITHOUT CONTRAST TECHNIQUE: Contiguous axial images were obtained from the base of the skull through the vertex without intravenous contrast. RADIATION DOSE REDUCTION: This exam was performed according to the departmental dose-optimization program which includes automated exposure control, adjustment of the mA and/or kV according to patient size and/or use of iterative reconstruction technique. COMPARISON:  10/30/2021. FINDINGS: Brain: There is periventricular white matter decreased attenuation consistent with small vessel ischemic changes. Ventricles, sulci and cisterns are prominent consistent with age related involutional changes. No acute intracranial hemorrhage, mass effect or shift. No hydrocephalus. Vascular: No hyperdense vessel or unexpected calcification. Skull: Normal. Negative for fracture or focal lesion. Sinuses/Orbits: No acute finding. IMPRESSION: Atrophy and chronic small vessel ischemic changes. No acute intracranial process identified. Electronically Signed   By: Sydell Eva M.D.   On: 04/02/2024 23:36     Time coordinating discharge: Over 30 minutes    Faith Homes, MD  [facility] 04/15/2024, 11:17 AM

## 2024-04-15 NOTE — Telephone Encounter (Signed)
 Spoke to patient;'s spouse, Bille(DPR ). She stated that patient is currently admitted and will be transferred to rehab. Seabron Cypress stated that she would call back to schedule an appt at a later date.   Routing to Dr. Waylan Haggard as an FYI

## 2024-04-15 NOTE — TOC Transition Note (Signed)
 Transition of Care [facility]) - Discharge Note   Patient Details  Name: Manuel Atkins MRN: [id number] Date of Birth: October 04, 1933  [facility] CM/SW Contact:  Tandy Fam, LCSW Phone Number: 04/15/2024, 12:29 PM   Clinical Narrative:   CSW updated by MD that patient stable to discharge to SNF today. CSW sent discharge information to [facility], confirmed receipt and bed is available. CSW updated spouse via phone, she is in agreement. Transport arranged with [facility] for next available.     Final next level of care: Skilled Nursing Facility Barriers to Discharge: Barriers Resolved   Patient Goals and CMS Choice            Discharge Placement              Patient chooses bed at: [facility] Patient to be transferred to facility by: [facility] Name of family member notified: Billie Patient and family notified of of transfer: 04/15/24  Discharge Plan and Services Additional resources added to the After Visit Summary for                                       Social Drivers of Health (SDOH) Interventions SDOH Screenings   Food Insecurity: No Food Insecurity (04/11/2024)  Housing: Low Risk  (04/11/2024)  Transportation Needs: No Transportation Needs (04/11/2024)  Utilities: Not At Risk (04/11/2024)  Financial Resource Strain: Low Risk  (07/04/2022)  Social Connections: Moderately Integrated (04/11/2024)  Tobacco Use: Medium Risk (04/11/2024)     Readmission Risk Interventions    11/01/2021    4:47 PM  Readmission Risk Prevention Plan  Transportation Screening Complete  PCP or Specialist Appt within 3-5 Days Complete  HRI or Home Care Consult Complete  Social Work Consult for Recovery Care Planning/Counseling Complete  Palliative Care Screening Not Applicable  Medication Review Oceanographer) Complete

## 2024-04-29 DIAGNOSIS — Z9181 History of falling: Secondary | ICD-10-CM | POA: Diagnosis not present

## 2024-04-29 DIAGNOSIS — Z741 Need for assistance with personal care: Secondary | ICD-10-CM | POA: Diagnosis not present

## 2024-04-29 DIAGNOSIS — G4733 Obstructive sleep apnea (adult) (pediatric): Secondary | ICD-10-CM | POA: Diagnosis not present

## 2024-04-29 DIAGNOSIS — R54 Age-related physical debility: Secondary | ICD-10-CM | POA: Diagnosis not present

## 2024-04-29 DIAGNOSIS — K5100 Ulcerative (chronic) pancolitis without complications: Secondary | ICD-10-CM | POA: Diagnosis not present

## 2024-04-29 DIAGNOSIS — E1165 Type 2 diabetes mellitus with hyperglycemia: Secondary | ICD-10-CM | POA: Diagnosis not present

## 2024-04-29 DIAGNOSIS — R296 Repeated falls: Secondary | ICD-10-CM | POA: Diagnosis not present

## 2024-04-29 DIAGNOSIS — S32010D Wedge compression fracture of first lumbar vertebra, subsequent encounter for fracture with routine healing: Secondary | ICD-10-CM | POA: Diagnosis not present

## 2024-04-30 DIAGNOSIS — E782 Mixed hyperlipidemia: Secondary | ICD-10-CM | POA: Diagnosis not present

## 2024-04-30 DIAGNOSIS — E1122 Type 2 diabetes mellitus with diabetic chronic kidney disease: Secondary | ICD-10-CM | POA: Diagnosis not present

## 2024-04-30 DIAGNOSIS — Z9181 History of falling: Secondary | ICD-10-CM | POA: Diagnosis not present

## 2024-04-30 DIAGNOSIS — H409 Unspecified glaucoma: Secondary | ICD-10-CM | POA: Diagnosis not present

## 2024-04-30 DIAGNOSIS — D631 Anemia in chronic kidney disease: Secondary | ICD-10-CM | POA: Diagnosis not present

## 2024-04-30 DIAGNOSIS — K219 Gastro-esophageal reflux disease without esophagitis: Secondary | ICD-10-CM | POA: Diagnosis not present

## 2024-04-30 DIAGNOSIS — N1831 Chronic kidney disease, stage 3a: Secondary | ICD-10-CM | POA: Diagnosis not present

## 2024-04-30 DIAGNOSIS — K5190 Ulcerative colitis, unspecified, without complications: Secondary | ICD-10-CM | POA: Diagnosis not present

## 2024-04-30 DIAGNOSIS — S32010D Wedge compression fracture of first lumbar vertebra, subsequent encounter for fracture with routine healing: Secondary | ICD-10-CM | POA: Diagnosis not present

## 2024-04-30 DIAGNOSIS — Z7901 Long term (current) use of anticoagulants: Secondary | ICD-10-CM | POA: Diagnosis not present

## 2024-04-30 DIAGNOSIS — G4733 Obstructive sleep apnea (adult) (pediatric): Secondary | ICD-10-CM | POA: Diagnosis not present

## 2024-04-30 DIAGNOSIS — J849 Interstitial pulmonary disease, unspecified: Secondary | ICD-10-CM | POA: Diagnosis not present

## 2024-04-30 DIAGNOSIS — I129 Hypertensive chronic kidney disease with stage 1 through stage 4 chronic kidney disease, or unspecified chronic kidney disease: Secondary | ICD-10-CM | POA: Diagnosis not present

## 2024-04-30 DIAGNOSIS — K222 Esophageal obstruction: Secondary | ICD-10-CM | POA: Diagnosis not present

## 2024-04-30 DIAGNOSIS — E1165 Type 2 diabetes mellitus with hyperglycemia: Secondary | ICD-10-CM | POA: Diagnosis not present

## 2024-04-30 DIAGNOSIS — Z86711 Personal history of pulmonary embolism: Secondary | ICD-10-CM | POA: Diagnosis not present

## 2024-04-30 DIAGNOSIS — Z556 Problems related to health literacy: Secondary | ICD-10-CM | POA: Diagnosis not present

## 2024-04-30 DIAGNOSIS — Z7984 Long term (current) use of oral hypoglycemic drugs: Secondary | ICD-10-CM | POA: Diagnosis not present

## 2024-05-04 DIAGNOSIS — G4733 Obstructive sleep apnea (adult) (pediatric): Secondary | ICD-10-CM | POA: Diagnosis not present

## 2024-05-04 DIAGNOSIS — Z7901 Long term (current) use of anticoagulants: Secondary | ICD-10-CM | POA: Diagnosis not present

## 2024-05-04 DIAGNOSIS — S32010D Wedge compression fracture of first lumbar vertebra, subsequent encounter for fracture with routine healing: Secondary | ICD-10-CM | POA: Diagnosis not present

## 2024-05-04 DIAGNOSIS — E1165 Type 2 diabetes mellitus with hyperglycemia: Secondary | ICD-10-CM | POA: Diagnosis not present

## 2024-05-04 DIAGNOSIS — H409 Unspecified glaucoma: Secondary | ICD-10-CM | POA: Diagnosis not present

## 2024-05-04 DIAGNOSIS — I129 Hypertensive chronic kidney disease with stage 1 through stage 4 chronic kidney disease, or unspecified chronic kidney disease: Secondary | ICD-10-CM | POA: Diagnosis not present

## 2024-05-04 DIAGNOSIS — N1831 Chronic kidney disease, stage 3a: Secondary | ICD-10-CM | POA: Diagnosis not present

## 2024-05-04 DIAGNOSIS — K219 Gastro-esophageal reflux disease without esophagitis: Secondary | ICD-10-CM | POA: Diagnosis not present

## 2024-05-04 DIAGNOSIS — J849 Interstitial pulmonary disease, unspecified: Secondary | ICD-10-CM | POA: Diagnosis not present

## 2024-05-04 DIAGNOSIS — D631 Anemia in chronic kidney disease: Secondary | ICD-10-CM | POA: Diagnosis not present

## 2024-05-04 DIAGNOSIS — Z86711 Personal history of pulmonary embolism: Secondary | ICD-10-CM | POA: Diagnosis not present

## 2024-05-04 DIAGNOSIS — E1122 Type 2 diabetes mellitus with diabetic chronic kidney disease: Secondary | ICD-10-CM | POA: Diagnosis not present

## 2024-05-04 DIAGNOSIS — E782 Mixed hyperlipidemia: Secondary | ICD-10-CM | POA: Diagnosis not present

## 2024-05-04 DIAGNOSIS — K5190 Ulcerative colitis, unspecified, without complications: Secondary | ICD-10-CM | POA: Diagnosis not present

## 2024-05-04 DIAGNOSIS — Z9181 History of falling: Secondary | ICD-10-CM | POA: Diagnosis not present

## 2024-05-04 DIAGNOSIS — Z7984 Long term (current) use of oral hypoglycemic drugs: Secondary | ICD-10-CM | POA: Diagnosis not present

## 2024-05-04 DIAGNOSIS — K222 Esophageal obstruction: Secondary | ICD-10-CM | POA: Diagnosis not present

## 2024-05-04 DIAGNOSIS — Z556 Problems related to health literacy: Secondary | ICD-10-CM | POA: Diagnosis not present

## 2024-05-06 DIAGNOSIS — Z9181 History of falling: Secondary | ICD-10-CM | POA: Diagnosis not present

## 2024-05-06 DIAGNOSIS — G4733 Obstructive sleep apnea (adult) (pediatric): Secondary | ICD-10-CM | POA: Diagnosis not present

## 2024-05-06 DIAGNOSIS — Z86711 Personal history of pulmonary embolism: Secondary | ICD-10-CM | POA: Diagnosis not present

## 2024-05-06 DIAGNOSIS — K5190 Ulcerative colitis, unspecified, without complications: Secondary | ICD-10-CM | POA: Diagnosis not present

## 2024-05-06 DIAGNOSIS — E1165 Type 2 diabetes mellitus with hyperglycemia: Secondary | ICD-10-CM | POA: Diagnosis not present

## 2024-05-06 DIAGNOSIS — S32010D Wedge compression fracture of first lumbar vertebra, subsequent encounter for fracture with routine healing: Secondary | ICD-10-CM | POA: Diagnosis not present

## 2024-05-06 DIAGNOSIS — N1831 Chronic kidney disease, stage 3a: Secondary | ICD-10-CM | POA: Diagnosis not present

## 2024-05-06 DIAGNOSIS — J849 Interstitial pulmonary disease, unspecified: Secondary | ICD-10-CM | POA: Diagnosis not present

## 2024-05-06 DIAGNOSIS — E1122 Type 2 diabetes mellitus with diabetic chronic kidney disease: Secondary | ICD-10-CM | POA: Diagnosis not present

## 2024-05-06 DIAGNOSIS — K222 Esophageal obstruction: Secondary | ICD-10-CM | POA: Diagnosis not present

## 2024-05-06 DIAGNOSIS — H409 Unspecified glaucoma: Secondary | ICD-10-CM | POA: Diagnosis not present

## 2024-05-06 DIAGNOSIS — Z556 Problems related to health literacy: Secondary | ICD-10-CM | POA: Diagnosis not present

## 2024-05-06 DIAGNOSIS — Z7984 Long term (current) use of oral hypoglycemic drugs: Secondary | ICD-10-CM | POA: Diagnosis not present

## 2024-05-06 DIAGNOSIS — Z7901 Long term (current) use of anticoagulants: Secondary | ICD-10-CM | POA: Diagnosis not present

## 2024-05-06 DIAGNOSIS — E782 Mixed hyperlipidemia: Secondary | ICD-10-CM | POA: Diagnosis not present

## 2024-05-06 DIAGNOSIS — I129 Hypertensive chronic kidney disease with stage 1 through stage 4 chronic kidney disease, or unspecified chronic kidney disease: Secondary | ICD-10-CM | POA: Diagnosis not present

## 2024-05-06 DIAGNOSIS — D631 Anemia in chronic kidney disease: Secondary | ICD-10-CM | POA: Diagnosis not present

## 2024-05-06 DIAGNOSIS — K219 Gastro-esophageal reflux disease without esophagitis: Secondary | ICD-10-CM | POA: Diagnosis not present

## 2024-05-09 DIAGNOSIS — J849 Interstitial pulmonary disease, unspecified: Secondary | ICD-10-CM | POA: Diagnosis not present

## 2024-05-09 DIAGNOSIS — I129 Hypertensive chronic kidney disease with stage 1 through stage 4 chronic kidney disease, or unspecified chronic kidney disease: Secondary | ICD-10-CM | POA: Diagnosis not present

## 2024-05-09 DIAGNOSIS — K5190 Ulcerative colitis, unspecified, without complications: Secondary | ICD-10-CM | POA: Diagnosis not present

## 2024-05-09 DIAGNOSIS — K219 Gastro-esophageal reflux disease without esophagitis: Secondary | ICD-10-CM | POA: Diagnosis not present

## 2024-05-09 DIAGNOSIS — K222 Esophageal obstruction: Secondary | ICD-10-CM | POA: Diagnosis not present

## 2024-05-09 DIAGNOSIS — G4733 Obstructive sleep apnea (adult) (pediatric): Secondary | ICD-10-CM | POA: Diagnosis not present

## 2024-05-09 DIAGNOSIS — E782 Mixed hyperlipidemia: Secondary | ICD-10-CM | POA: Diagnosis not present

## 2024-05-09 DIAGNOSIS — E1122 Type 2 diabetes mellitus with diabetic chronic kidney disease: Secondary | ICD-10-CM | POA: Diagnosis not present

## 2024-05-09 DIAGNOSIS — D631 Anemia in chronic kidney disease: Secondary | ICD-10-CM | POA: Diagnosis not present

## 2024-05-09 DIAGNOSIS — E1165 Type 2 diabetes mellitus with hyperglycemia: Secondary | ICD-10-CM | POA: Diagnosis not present

## 2024-05-09 DIAGNOSIS — N1831 Chronic kidney disease, stage 3a: Secondary | ICD-10-CM | POA: Diagnosis not present

## 2024-05-09 DIAGNOSIS — S32010D Wedge compression fracture of first lumbar vertebra, subsequent encounter for fracture with routine healing: Secondary | ICD-10-CM | POA: Diagnosis not present

## 2024-05-10 ENCOUNTER — Ambulatory Visit

## 2024-05-10 DIAGNOSIS — E1165 Type 2 diabetes mellitus with hyperglycemia: Secondary | ICD-10-CM | POA: Diagnosis not present

## 2024-05-10 DIAGNOSIS — K5190 Ulcerative colitis, unspecified, without complications: Secondary | ICD-10-CM | POA: Diagnosis not present

## 2024-05-10 DIAGNOSIS — Z9181 History of falling: Secondary | ICD-10-CM | POA: Diagnosis not present

## 2024-05-10 DIAGNOSIS — I10 Essential (primary) hypertension: Secondary | ICD-10-CM | POA: Diagnosis not present

## 2024-05-10 DIAGNOSIS — N1831 Chronic kidney disease, stage 3a: Secondary | ICD-10-CM | POA: Diagnosis not present

## 2024-05-10 DIAGNOSIS — K219 Gastro-esophageal reflux disease without esophagitis: Secondary | ICD-10-CM | POA: Diagnosis not present

## 2024-05-10 DIAGNOSIS — Z7901 Long term (current) use of anticoagulants: Secondary | ICD-10-CM | POA: Diagnosis not present

## 2024-05-10 DIAGNOSIS — D631 Anemia in chronic kidney disease: Secondary | ICD-10-CM | POA: Diagnosis not present

## 2024-05-10 DIAGNOSIS — I4430 Unspecified atrioventricular block: Secondary | ICD-10-CM

## 2024-05-10 DIAGNOSIS — S32010D Wedge compression fracture of first lumbar vertebra, subsequent encounter for fracture with routine healing: Secondary | ICD-10-CM | POA: Diagnosis not present

## 2024-05-10 DIAGNOSIS — Z7984 Long term (current) use of oral hypoglycemic drugs: Secondary | ICD-10-CM | POA: Diagnosis not present

## 2024-05-10 DIAGNOSIS — Z86711 Personal history of pulmonary embolism: Secondary | ICD-10-CM | POA: Diagnosis not present

## 2024-05-10 DIAGNOSIS — I129 Hypertensive chronic kidney disease with stage 1 through stage 4 chronic kidney disease, or unspecified chronic kidney disease: Secondary | ICD-10-CM | POA: Diagnosis not present

## 2024-05-10 DIAGNOSIS — Z556 Problems related to health literacy: Secondary | ICD-10-CM | POA: Diagnosis not present

## 2024-05-10 DIAGNOSIS — H409 Unspecified glaucoma: Secondary | ICD-10-CM | POA: Diagnosis not present

## 2024-05-10 DIAGNOSIS — G4733 Obstructive sleep apnea (adult) (pediatric): Secondary | ICD-10-CM | POA: Diagnosis not present

## 2024-05-10 DIAGNOSIS — E782 Mixed hyperlipidemia: Secondary | ICD-10-CM | POA: Diagnosis not present

## 2024-05-10 DIAGNOSIS — E1122 Type 2 diabetes mellitus with diabetic chronic kidney disease: Secondary | ICD-10-CM | POA: Diagnosis not present

## 2024-05-10 DIAGNOSIS — K222 Esophageal obstruction: Secondary | ICD-10-CM | POA: Diagnosis not present

## 2024-05-10 DIAGNOSIS — J849 Interstitial pulmonary disease, unspecified: Secondary | ICD-10-CM | POA: Diagnosis not present

## 2024-05-11 ENCOUNTER — Ambulatory Visit: Admitting: Urology

## 2024-05-11 DIAGNOSIS — E1122 Type 2 diabetes mellitus with diabetic chronic kidney disease: Secondary | ICD-10-CM | POA: Diagnosis not present

## 2024-05-11 DIAGNOSIS — I129 Hypertensive chronic kidney disease with stage 1 through stage 4 chronic kidney disease, or unspecified chronic kidney disease: Secondary | ICD-10-CM | POA: Diagnosis not present

## 2024-05-11 DIAGNOSIS — G4733 Obstructive sleep apnea (adult) (pediatric): Secondary | ICD-10-CM | POA: Diagnosis not present

## 2024-05-11 DIAGNOSIS — K222 Esophageal obstruction: Secondary | ICD-10-CM | POA: Diagnosis not present

## 2024-05-11 DIAGNOSIS — Z556 Problems related to health literacy: Secondary | ICD-10-CM | POA: Diagnosis not present

## 2024-05-11 DIAGNOSIS — Z7984 Long term (current) use of oral hypoglycemic drugs: Secondary | ICD-10-CM | POA: Diagnosis not present

## 2024-05-11 DIAGNOSIS — J849 Interstitial pulmonary disease, unspecified: Secondary | ICD-10-CM | POA: Diagnosis not present

## 2024-05-11 DIAGNOSIS — D631 Anemia in chronic kidney disease: Secondary | ICD-10-CM | POA: Diagnosis not present

## 2024-05-11 DIAGNOSIS — E1165 Type 2 diabetes mellitus with hyperglycemia: Secondary | ICD-10-CM | POA: Diagnosis not present

## 2024-05-11 DIAGNOSIS — E782 Mixed hyperlipidemia: Secondary | ICD-10-CM | POA: Diagnosis not present

## 2024-05-11 DIAGNOSIS — K219 Gastro-esophageal reflux disease without esophagitis: Secondary | ICD-10-CM | POA: Diagnosis not present

## 2024-05-11 DIAGNOSIS — Z7901 Long term (current) use of anticoagulants: Secondary | ICD-10-CM | POA: Diagnosis not present

## 2024-05-11 DIAGNOSIS — N1831 Chronic kidney disease, stage 3a: Secondary | ICD-10-CM | POA: Diagnosis not present

## 2024-05-11 DIAGNOSIS — K5190 Ulcerative colitis, unspecified, without complications: Secondary | ICD-10-CM | POA: Diagnosis not present

## 2024-05-11 DIAGNOSIS — S32010D Wedge compression fracture of first lumbar vertebra, subsequent encounter for fracture with routine healing: Secondary | ICD-10-CM | POA: Diagnosis not present

## 2024-05-11 DIAGNOSIS — Z9181 History of falling: Secondary | ICD-10-CM | POA: Diagnosis not present

## 2024-05-11 DIAGNOSIS — H409 Unspecified glaucoma: Secondary | ICD-10-CM | POA: Diagnosis not present

## 2024-05-11 DIAGNOSIS — Z86711 Personal history of pulmonary embolism: Secondary | ICD-10-CM | POA: Diagnosis not present

## 2024-05-11 LAB — CUP PACEART REMOTE DEVICE CHECK
Battery Voltage: 80
Date Time Interrogation Session: 20250701074644
Implantable Lead Connection Status: 753985
Implantable Lead Connection Status: 753985
Implantable Lead Implant Date: 20221222
Implantable Lead Implant Date: 20221222
Implantable Lead Location: 753858
Implantable Lead Location: 753859
Implantable Lead Model: 377171
Implantable Lead Model: 377171
Implantable Lead Serial Number: 8000640340
Implantable Lead Serial Number: 8000648576
Implantable Pulse Generator Implant Date: 20221222
Pulse Gen Model: 407145
Pulse Gen Serial Number: 70300615

## 2024-05-16 DIAGNOSIS — J849 Interstitial pulmonary disease, unspecified: Secondary | ICD-10-CM | POA: Diagnosis not present

## 2024-05-16 DIAGNOSIS — K222 Esophageal obstruction: Secondary | ICD-10-CM | POA: Diagnosis not present

## 2024-05-16 DIAGNOSIS — H409 Unspecified glaucoma: Secondary | ICD-10-CM | POA: Diagnosis not present

## 2024-05-16 DIAGNOSIS — K219 Gastro-esophageal reflux disease without esophagitis: Secondary | ICD-10-CM | POA: Diagnosis not present

## 2024-05-16 DIAGNOSIS — Z556 Problems related to health literacy: Secondary | ICD-10-CM | POA: Diagnosis not present

## 2024-05-16 DIAGNOSIS — E782 Mixed hyperlipidemia: Secondary | ICD-10-CM | POA: Diagnosis not present

## 2024-05-16 DIAGNOSIS — E1122 Type 2 diabetes mellitus with diabetic chronic kidney disease: Secondary | ICD-10-CM | POA: Diagnosis not present

## 2024-05-16 DIAGNOSIS — I129 Hypertensive chronic kidney disease with stage 1 through stage 4 chronic kidney disease, or unspecified chronic kidney disease: Secondary | ICD-10-CM | POA: Diagnosis not present

## 2024-05-16 DIAGNOSIS — Z7984 Long term (current) use of oral hypoglycemic drugs: Secondary | ICD-10-CM | POA: Diagnosis not present

## 2024-05-16 DIAGNOSIS — E1165 Type 2 diabetes mellitus with hyperglycemia: Secondary | ICD-10-CM | POA: Diagnosis not present

## 2024-05-16 DIAGNOSIS — K5190 Ulcerative colitis, unspecified, without complications: Secondary | ICD-10-CM | POA: Diagnosis not present

## 2024-05-16 DIAGNOSIS — S32010D Wedge compression fracture of first lumbar vertebra, subsequent encounter for fracture with routine healing: Secondary | ICD-10-CM | POA: Diagnosis not present

## 2024-05-16 DIAGNOSIS — Z86711 Personal history of pulmonary embolism: Secondary | ICD-10-CM | POA: Diagnosis not present

## 2024-05-16 DIAGNOSIS — Z9181 History of falling: Secondary | ICD-10-CM | POA: Diagnosis not present

## 2024-05-16 DIAGNOSIS — G4733 Obstructive sleep apnea (adult) (pediatric): Secondary | ICD-10-CM | POA: Diagnosis not present

## 2024-05-16 DIAGNOSIS — N1831 Chronic kidney disease, stage 3a: Secondary | ICD-10-CM | POA: Diagnosis not present

## 2024-05-16 DIAGNOSIS — D631 Anemia in chronic kidney disease: Secondary | ICD-10-CM | POA: Diagnosis not present

## 2024-05-16 DIAGNOSIS — Z7901 Long term (current) use of anticoagulants: Secondary | ICD-10-CM | POA: Diagnosis not present

## 2024-05-18 ENCOUNTER — Ambulatory Visit: Payer: Self-pay | Admitting: Internal Medicine

## 2024-05-19 DIAGNOSIS — K219 Gastro-esophageal reflux disease without esophagitis: Secondary | ICD-10-CM | POA: Diagnosis not present

## 2024-05-19 DIAGNOSIS — K222 Esophageal obstruction: Secondary | ICD-10-CM | POA: Diagnosis not present

## 2024-05-19 DIAGNOSIS — D631 Anemia in chronic kidney disease: Secondary | ICD-10-CM | POA: Diagnosis not present

## 2024-05-19 DIAGNOSIS — S32010D Wedge compression fracture of first lumbar vertebra, subsequent encounter for fracture with routine healing: Secondary | ICD-10-CM | POA: Diagnosis not present

## 2024-05-19 DIAGNOSIS — I129 Hypertensive chronic kidney disease with stage 1 through stage 4 chronic kidney disease, or unspecified chronic kidney disease: Secondary | ICD-10-CM | POA: Diagnosis not present

## 2024-05-19 DIAGNOSIS — G4733 Obstructive sleep apnea (adult) (pediatric): Secondary | ICD-10-CM | POA: Diagnosis not present

## 2024-05-19 DIAGNOSIS — Z86711 Personal history of pulmonary embolism: Secondary | ICD-10-CM | POA: Diagnosis not present

## 2024-05-19 DIAGNOSIS — N1831 Chronic kidney disease, stage 3a: Secondary | ICD-10-CM | POA: Diagnosis not present

## 2024-05-19 DIAGNOSIS — E1122 Type 2 diabetes mellitus with diabetic chronic kidney disease: Secondary | ICD-10-CM | POA: Diagnosis not present

## 2024-05-19 DIAGNOSIS — K5190 Ulcerative colitis, unspecified, without complications: Secondary | ICD-10-CM | POA: Diagnosis not present

## 2024-05-19 DIAGNOSIS — Z9181 History of falling: Secondary | ICD-10-CM | POA: Diagnosis not present

## 2024-05-19 DIAGNOSIS — Z7984 Long term (current) use of oral hypoglycemic drugs: Secondary | ICD-10-CM | POA: Diagnosis not present

## 2024-05-19 DIAGNOSIS — Z7901 Long term (current) use of anticoagulants: Secondary | ICD-10-CM | POA: Diagnosis not present

## 2024-05-19 DIAGNOSIS — E1165 Type 2 diabetes mellitus with hyperglycemia: Secondary | ICD-10-CM | POA: Diagnosis not present

## 2024-05-19 DIAGNOSIS — H409 Unspecified glaucoma: Secondary | ICD-10-CM | POA: Diagnosis not present

## 2024-05-19 DIAGNOSIS — Z556 Problems related to health literacy: Secondary | ICD-10-CM | POA: Diagnosis not present

## 2024-05-19 DIAGNOSIS — J849 Interstitial pulmonary disease, unspecified: Secondary | ICD-10-CM | POA: Diagnosis not present

## 2024-05-19 DIAGNOSIS — E782 Mixed hyperlipidemia: Secondary | ICD-10-CM | POA: Diagnosis not present

## 2024-05-23 DIAGNOSIS — Z7984 Long term (current) use of oral hypoglycemic drugs: Secondary | ICD-10-CM | POA: Diagnosis not present

## 2024-05-23 DIAGNOSIS — K5190 Ulcerative colitis, unspecified, without complications: Secondary | ICD-10-CM | POA: Diagnosis not present

## 2024-05-23 DIAGNOSIS — G4733 Obstructive sleep apnea (adult) (pediatric): Secondary | ICD-10-CM | POA: Diagnosis not present

## 2024-05-23 DIAGNOSIS — K222 Esophageal obstruction: Secondary | ICD-10-CM | POA: Diagnosis not present

## 2024-05-23 DIAGNOSIS — E782 Mixed hyperlipidemia: Secondary | ICD-10-CM | POA: Diagnosis not present

## 2024-05-23 DIAGNOSIS — E1165 Type 2 diabetes mellitus with hyperglycemia: Secondary | ICD-10-CM | POA: Diagnosis not present

## 2024-05-23 DIAGNOSIS — J849 Interstitial pulmonary disease, unspecified: Secondary | ICD-10-CM | POA: Diagnosis not present

## 2024-05-23 DIAGNOSIS — H409 Unspecified glaucoma: Secondary | ICD-10-CM | POA: Diagnosis not present

## 2024-05-23 DIAGNOSIS — I129 Hypertensive chronic kidney disease with stage 1 through stage 4 chronic kidney disease, or unspecified chronic kidney disease: Secondary | ICD-10-CM | POA: Diagnosis not present

## 2024-05-23 DIAGNOSIS — Z86711 Personal history of pulmonary embolism: Secondary | ICD-10-CM | POA: Diagnosis not present

## 2024-05-23 DIAGNOSIS — S32010D Wedge compression fracture of first lumbar vertebra, subsequent encounter for fracture with routine healing: Secondary | ICD-10-CM | POA: Diagnosis not present

## 2024-05-23 DIAGNOSIS — Z9181 History of falling: Secondary | ICD-10-CM | POA: Diagnosis not present

## 2024-05-23 DIAGNOSIS — Z556 Problems related to health literacy: Secondary | ICD-10-CM | POA: Diagnosis not present

## 2024-05-23 DIAGNOSIS — D631 Anemia in chronic kidney disease: Secondary | ICD-10-CM | POA: Diagnosis not present

## 2024-05-23 DIAGNOSIS — N1831 Chronic kidney disease, stage 3a: Secondary | ICD-10-CM | POA: Diagnosis not present

## 2024-05-23 DIAGNOSIS — K219 Gastro-esophageal reflux disease without esophagitis: Secondary | ICD-10-CM | POA: Diagnosis not present

## 2024-05-23 DIAGNOSIS — Z7901 Long term (current) use of anticoagulants: Secondary | ICD-10-CM | POA: Diagnosis not present

## 2024-05-23 DIAGNOSIS — E1122 Type 2 diabetes mellitus with diabetic chronic kidney disease: Secondary | ICD-10-CM | POA: Diagnosis not present

## 2024-05-27 DIAGNOSIS — H409 Unspecified glaucoma: Secondary | ICD-10-CM | POA: Diagnosis not present

## 2024-05-27 DIAGNOSIS — Z7901 Long term (current) use of anticoagulants: Secondary | ICD-10-CM | POA: Diagnosis not present

## 2024-05-27 DIAGNOSIS — E1122 Type 2 diabetes mellitus with diabetic chronic kidney disease: Secondary | ICD-10-CM | POA: Diagnosis not present

## 2024-05-27 DIAGNOSIS — S32010D Wedge compression fracture of first lumbar vertebra, subsequent encounter for fracture with routine healing: Secondary | ICD-10-CM | POA: Diagnosis not present

## 2024-05-27 DIAGNOSIS — G4733 Obstructive sleep apnea (adult) (pediatric): Secondary | ICD-10-CM | POA: Diagnosis not present

## 2024-05-27 DIAGNOSIS — I129 Hypertensive chronic kidney disease with stage 1 through stage 4 chronic kidney disease, or unspecified chronic kidney disease: Secondary | ICD-10-CM | POA: Diagnosis not present

## 2024-05-27 DIAGNOSIS — D631 Anemia in chronic kidney disease: Secondary | ICD-10-CM | POA: Diagnosis not present

## 2024-05-27 DIAGNOSIS — K5190 Ulcerative colitis, unspecified, without complications: Secondary | ICD-10-CM | POA: Diagnosis not present

## 2024-05-27 DIAGNOSIS — Z86711 Personal history of pulmonary embolism: Secondary | ICD-10-CM | POA: Diagnosis not present

## 2024-05-27 DIAGNOSIS — Z556 Problems related to health literacy: Secondary | ICD-10-CM | POA: Diagnosis not present

## 2024-05-27 DIAGNOSIS — K219 Gastro-esophageal reflux disease without esophagitis: Secondary | ICD-10-CM | POA: Diagnosis not present

## 2024-05-27 DIAGNOSIS — Z7984 Long term (current) use of oral hypoglycemic drugs: Secondary | ICD-10-CM | POA: Diagnosis not present

## 2024-05-27 DIAGNOSIS — E1165 Type 2 diabetes mellitus with hyperglycemia: Secondary | ICD-10-CM | POA: Diagnosis not present

## 2024-05-27 DIAGNOSIS — N1831 Chronic kidney disease, stage 3a: Secondary | ICD-10-CM | POA: Diagnosis not present

## 2024-05-27 DIAGNOSIS — J849 Interstitial pulmonary disease, unspecified: Secondary | ICD-10-CM | POA: Diagnosis not present

## 2024-05-27 DIAGNOSIS — K222 Esophageal obstruction: Secondary | ICD-10-CM | POA: Diagnosis not present

## 2024-05-27 DIAGNOSIS — E782 Mixed hyperlipidemia: Secondary | ICD-10-CM | POA: Diagnosis not present

## 2024-05-27 DIAGNOSIS — Z9181 History of falling: Secondary | ICD-10-CM | POA: Diagnosis not present

## 2024-05-29 DIAGNOSIS — E1122 Type 2 diabetes mellitus with diabetic chronic kidney disease: Secondary | ICD-10-CM | POA: Diagnosis not present

## 2024-05-29 DIAGNOSIS — S32010D Wedge compression fracture of first lumbar vertebra, subsequent encounter for fracture with routine healing: Secondary | ICD-10-CM | POA: Diagnosis not present

## 2024-05-29 DIAGNOSIS — J849 Interstitial pulmonary disease, unspecified: Secondary | ICD-10-CM | POA: Diagnosis not present

## 2024-05-29 DIAGNOSIS — K7581 Nonalcoholic steatohepatitis (NASH): Secondary | ICD-10-CM | POA: Diagnosis not present

## 2024-05-29 DIAGNOSIS — Z9181 History of falling: Secondary | ICD-10-CM | POA: Diagnosis not present

## 2024-05-29 DIAGNOSIS — K5100 Ulcerative (chronic) pancolitis without complications: Secondary | ICD-10-CM | POA: Diagnosis not present

## 2024-05-29 DIAGNOSIS — R296 Repeated falls: Secondary | ICD-10-CM | POA: Diagnosis not present

## 2024-05-30 DIAGNOSIS — Z7984 Long term (current) use of oral hypoglycemic drugs: Secondary | ICD-10-CM | POA: Diagnosis not present

## 2024-05-30 DIAGNOSIS — D631 Anemia in chronic kidney disease: Secondary | ICD-10-CM | POA: Diagnosis not present

## 2024-05-30 DIAGNOSIS — G43909 Migraine, unspecified, not intractable, without status migrainosus: Secondary | ICD-10-CM | POA: Diagnosis not present

## 2024-05-30 DIAGNOSIS — K219 Gastro-esophageal reflux disease without esophagitis: Secondary | ICD-10-CM | POA: Diagnosis not present

## 2024-05-30 DIAGNOSIS — Z86711 Personal history of pulmonary embolism: Secondary | ICD-10-CM | POA: Diagnosis not present

## 2024-05-30 DIAGNOSIS — E782 Mixed hyperlipidemia: Secondary | ICD-10-CM | POA: Diagnosis not present

## 2024-05-30 DIAGNOSIS — K222 Esophageal obstruction: Secondary | ICD-10-CM | POA: Diagnosis not present

## 2024-05-30 DIAGNOSIS — Z9181 History of falling: Secondary | ICD-10-CM | POA: Diagnosis not present

## 2024-05-30 DIAGNOSIS — N1831 Chronic kidney disease, stage 3a: Secondary | ICD-10-CM | POA: Diagnosis not present

## 2024-05-30 DIAGNOSIS — K2100 Gastro-esophageal reflux disease with esophagitis, without bleeding: Secondary | ICD-10-CM | POA: Diagnosis not present

## 2024-05-30 DIAGNOSIS — J849 Interstitial pulmonary disease, unspecified: Secondary | ICD-10-CM | POA: Diagnosis not present

## 2024-05-30 DIAGNOSIS — H409 Unspecified glaucoma: Secondary | ICD-10-CM | POA: Diagnosis not present

## 2024-05-30 DIAGNOSIS — E1165 Type 2 diabetes mellitus with hyperglycemia: Secondary | ICD-10-CM | POA: Diagnosis not present

## 2024-05-30 DIAGNOSIS — K5190 Ulcerative colitis, unspecified, without complications: Secondary | ICD-10-CM | POA: Diagnosis not present

## 2024-05-30 DIAGNOSIS — I129 Hypertensive chronic kidney disease with stage 1 through stage 4 chronic kidney disease, or unspecified chronic kidney disease: Secondary | ICD-10-CM | POA: Diagnosis not present

## 2024-05-30 DIAGNOSIS — Z556 Problems related to health literacy: Secondary | ICD-10-CM | POA: Diagnosis not present

## 2024-05-30 DIAGNOSIS — E1122 Type 2 diabetes mellitus with diabetic chronic kidney disease: Secondary | ICD-10-CM | POA: Diagnosis not present

## 2024-05-30 DIAGNOSIS — G4733 Obstructive sleep apnea (adult) (pediatric): Secondary | ICD-10-CM | POA: Diagnosis not present

## 2024-05-30 DIAGNOSIS — H6123 Impacted cerumen, bilateral: Secondary | ICD-10-CM | POA: Diagnosis not present

## 2024-05-30 DIAGNOSIS — Z950 Presence of cardiac pacemaker: Secondary | ICD-10-CM | POA: Diagnosis not present

## 2024-05-30 DIAGNOSIS — K5100 Ulcerative (chronic) pancolitis without complications: Secondary | ICD-10-CM | POA: Diagnosis not present

## 2024-05-30 DIAGNOSIS — I82409 Acute embolism and thrombosis of unspecified deep veins of unspecified lower extremity: Secondary | ICD-10-CM | POA: Diagnosis not present

## 2024-05-30 DIAGNOSIS — S32010D Wedge compression fracture of first lumbar vertebra, subsequent encounter for fracture with routine healing: Secondary | ICD-10-CM | POA: Diagnosis not present

## 2024-05-30 DIAGNOSIS — I10 Essential (primary) hypertension: Secondary | ICD-10-CM | POA: Diagnosis not present

## 2024-05-30 DIAGNOSIS — Z7901 Long term (current) use of anticoagulants: Secondary | ICD-10-CM | POA: Diagnosis not present

## 2024-05-31 ENCOUNTER — Telehealth (HOSPITAL_BASED_OUTPATIENT_CLINIC_OR_DEPARTMENT_OTHER): Payer: Self-pay

## 2024-05-31 NOTE — Telephone Encounter (Signed)
 Called and spoke with Brook,relayed message,verbalized understanding.NFN

## 2024-05-31 NOTE — Telephone Encounter (Signed)
 Copied from CRM [id number]. Topic: Clinical - Medication Question >> May 31, 2024  9:06 AM Celestine FALCON wrote: Reason for CRM: Brook from [employer] called to request an approval to reduce medication dosage for ELIQUIS  5 MG TABS tablet from 5mg  to 2.5mg  2x a day due to age and the pt's fall history. Their fax number is [phone] and phone number is [phone].

## 2024-05-31 NOTE — Telephone Encounter (Signed)
 Yes.  Dosage reduction of Eliquis  is appropriate due to age and fall risk.

## 2024-06-01 DIAGNOSIS — Z86711 Personal history of pulmonary embolism: Secondary | ICD-10-CM | POA: Diagnosis not present

## 2024-06-01 DIAGNOSIS — K5190 Ulcerative colitis, unspecified, without complications: Secondary | ICD-10-CM | POA: Diagnosis not present

## 2024-06-01 DIAGNOSIS — J849 Interstitial pulmonary disease, unspecified: Secondary | ICD-10-CM | POA: Diagnosis not present

## 2024-06-01 DIAGNOSIS — K219 Gastro-esophageal reflux disease without esophagitis: Secondary | ICD-10-CM | POA: Diagnosis not present

## 2024-06-01 DIAGNOSIS — Z556 Problems related to health literacy: Secondary | ICD-10-CM | POA: Diagnosis not present

## 2024-06-01 DIAGNOSIS — Z7984 Long term (current) use of oral hypoglycemic drugs: Secondary | ICD-10-CM | POA: Diagnosis not present

## 2024-06-01 DIAGNOSIS — Z7901 Long term (current) use of anticoagulants: Secondary | ICD-10-CM | POA: Diagnosis not present

## 2024-06-01 DIAGNOSIS — S32010D Wedge compression fracture of first lumbar vertebra, subsequent encounter for fracture with routine healing: Secondary | ICD-10-CM | POA: Diagnosis not present

## 2024-06-01 DIAGNOSIS — G4733 Obstructive sleep apnea (adult) (pediatric): Secondary | ICD-10-CM | POA: Diagnosis not present

## 2024-06-01 DIAGNOSIS — E1122 Type 2 diabetes mellitus with diabetic chronic kidney disease: Secondary | ICD-10-CM | POA: Diagnosis not present

## 2024-06-01 DIAGNOSIS — E1165 Type 2 diabetes mellitus with hyperglycemia: Secondary | ICD-10-CM | POA: Diagnosis not present

## 2024-06-01 DIAGNOSIS — E782 Mixed hyperlipidemia: Secondary | ICD-10-CM | POA: Diagnosis not present

## 2024-06-01 DIAGNOSIS — N1831 Chronic kidney disease, stage 3a: Secondary | ICD-10-CM | POA: Diagnosis not present

## 2024-06-01 DIAGNOSIS — H409 Unspecified glaucoma: Secondary | ICD-10-CM | POA: Diagnosis not present

## 2024-06-01 DIAGNOSIS — Z9181 History of falling: Secondary | ICD-10-CM | POA: Diagnosis not present

## 2024-06-01 DIAGNOSIS — K222 Esophageal obstruction: Secondary | ICD-10-CM | POA: Diagnosis not present

## 2024-06-01 DIAGNOSIS — D631 Anemia in chronic kidney disease: Secondary | ICD-10-CM | POA: Diagnosis not present

## 2024-06-01 DIAGNOSIS — I129 Hypertensive chronic kidney disease with stage 1 through stage 4 chronic kidney disease, or unspecified chronic kidney disease: Secondary | ICD-10-CM | POA: Diagnosis not present

## 2024-06-08 DIAGNOSIS — K5190 Ulcerative colitis, unspecified, without complications: Secondary | ICD-10-CM | POA: Diagnosis not present

## 2024-06-08 DIAGNOSIS — E1122 Type 2 diabetes mellitus with diabetic chronic kidney disease: Secondary | ICD-10-CM | POA: Diagnosis not present

## 2024-06-08 DIAGNOSIS — G4733 Obstructive sleep apnea (adult) (pediatric): Secondary | ICD-10-CM | POA: Diagnosis not present

## 2024-06-08 DIAGNOSIS — S32010D Wedge compression fracture of first lumbar vertebra, subsequent encounter for fracture with routine healing: Secondary | ICD-10-CM | POA: Diagnosis not present

## 2024-06-08 DIAGNOSIS — Z7901 Long term (current) use of anticoagulants: Secondary | ICD-10-CM | POA: Diagnosis not present

## 2024-06-08 DIAGNOSIS — E1165 Type 2 diabetes mellitus with hyperglycemia: Secondary | ICD-10-CM | POA: Diagnosis not present

## 2024-06-08 DIAGNOSIS — E782 Mixed hyperlipidemia: Secondary | ICD-10-CM | POA: Diagnosis not present

## 2024-06-08 DIAGNOSIS — K219 Gastro-esophageal reflux disease without esophagitis: Secondary | ICD-10-CM | POA: Diagnosis not present

## 2024-06-08 DIAGNOSIS — D631 Anemia in chronic kidney disease: Secondary | ICD-10-CM | POA: Diagnosis not present

## 2024-06-08 DIAGNOSIS — Z9181 History of falling: Secondary | ICD-10-CM | POA: Diagnosis not present

## 2024-06-08 DIAGNOSIS — K222 Esophageal obstruction: Secondary | ICD-10-CM | POA: Diagnosis not present

## 2024-06-08 DIAGNOSIS — N1831 Chronic kidney disease, stage 3a: Secondary | ICD-10-CM | POA: Diagnosis not present

## 2024-06-08 DIAGNOSIS — Z86711 Personal history of pulmonary embolism: Secondary | ICD-10-CM | POA: Diagnosis not present

## 2024-06-08 DIAGNOSIS — Z556 Problems related to health literacy: Secondary | ICD-10-CM | POA: Diagnosis not present

## 2024-06-08 DIAGNOSIS — I129 Hypertensive chronic kidney disease with stage 1 through stage 4 chronic kidney disease, or unspecified chronic kidney disease: Secondary | ICD-10-CM | POA: Diagnosis not present

## 2024-06-08 DIAGNOSIS — Z7984 Long term (current) use of oral hypoglycemic drugs: Secondary | ICD-10-CM | POA: Diagnosis not present

## 2024-06-08 DIAGNOSIS — H409 Unspecified glaucoma: Secondary | ICD-10-CM | POA: Diagnosis not present

## 2024-06-08 DIAGNOSIS — J849 Interstitial pulmonary disease, unspecified: Secondary | ICD-10-CM | POA: Diagnosis not present

## 2024-06-10 DIAGNOSIS — E1122 Type 2 diabetes mellitus with diabetic chronic kidney disease: Secondary | ICD-10-CM | POA: Diagnosis not present

## 2024-06-10 DIAGNOSIS — I10 Essential (primary) hypertension: Secondary | ICD-10-CM | POA: Diagnosis not present

## 2024-06-10 DIAGNOSIS — E782 Mixed hyperlipidemia: Secondary | ICD-10-CM | POA: Diagnosis not present

## 2024-06-15 DIAGNOSIS — Z86711 Personal history of pulmonary embolism: Secondary | ICD-10-CM | POA: Diagnosis not present

## 2024-06-15 DIAGNOSIS — Z7901 Long term (current) use of anticoagulants: Secondary | ICD-10-CM | POA: Diagnosis not present

## 2024-06-15 DIAGNOSIS — J849 Interstitial pulmonary disease, unspecified: Secondary | ICD-10-CM | POA: Diagnosis not present

## 2024-06-15 DIAGNOSIS — K222 Esophageal obstruction: Secondary | ICD-10-CM | POA: Diagnosis not present

## 2024-06-15 DIAGNOSIS — K5190 Ulcerative colitis, unspecified, without complications: Secondary | ICD-10-CM | POA: Diagnosis not present

## 2024-06-15 DIAGNOSIS — E1165 Type 2 diabetes mellitus with hyperglycemia: Secondary | ICD-10-CM | POA: Diagnosis not present

## 2024-06-15 DIAGNOSIS — N1831 Chronic kidney disease, stage 3a: Secondary | ICD-10-CM | POA: Diagnosis not present

## 2024-06-15 DIAGNOSIS — S32010D Wedge compression fracture of first lumbar vertebra, subsequent encounter for fracture with routine healing: Secondary | ICD-10-CM | POA: Diagnosis not present

## 2024-06-15 DIAGNOSIS — Z7984 Long term (current) use of oral hypoglycemic drugs: Secondary | ICD-10-CM | POA: Diagnosis not present

## 2024-06-15 DIAGNOSIS — E782 Mixed hyperlipidemia: Secondary | ICD-10-CM | POA: Diagnosis not present

## 2024-06-15 DIAGNOSIS — Z556 Problems related to health literacy: Secondary | ICD-10-CM | POA: Diagnosis not present

## 2024-06-15 DIAGNOSIS — E1122 Type 2 diabetes mellitus with diabetic chronic kidney disease: Secondary | ICD-10-CM | POA: Diagnosis not present

## 2024-06-15 DIAGNOSIS — L110 Acquired keratosis follicularis: Secondary | ICD-10-CM | POA: Diagnosis not present

## 2024-06-15 DIAGNOSIS — G4733 Obstructive sleep apnea (adult) (pediatric): Secondary | ICD-10-CM | POA: Diagnosis not present

## 2024-06-15 DIAGNOSIS — E1140 Type 2 diabetes mellitus with diabetic neuropathy, unspecified: Secondary | ICD-10-CM | POA: Diagnosis not present

## 2024-06-15 DIAGNOSIS — K219 Gastro-esophageal reflux disease without esophagitis: Secondary | ICD-10-CM | POA: Diagnosis not present

## 2024-06-15 DIAGNOSIS — B351 Tinea unguium: Secondary | ICD-10-CM | POA: Diagnosis not present

## 2024-06-15 DIAGNOSIS — H409 Unspecified glaucoma: Secondary | ICD-10-CM | POA: Diagnosis not present

## 2024-06-15 DIAGNOSIS — Z9181 History of falling: Secondary | ICD-10-CM | POA: Diagnosis not present

## 2024-06-15 DIAGNOSIS — D631 Anemia in chronic kidney disease: Secondary | ICD-10-CM | POA: Diagnosis not present

## 2024-06-15 DIAGNOSIS — I129 Hypertensive chronic kidney disease with stage 1 through stage 4 chronic kidney disease, or unspecified chronic kidney disease: Secondary | ICD-10-CM | POA: Diagnosis not present

## 2024-06-16 DIAGNOSIS — Z85820 Personal history of malignant melanoma of skin: Secondary | ICD-10-CM | POA: Diagnosis not present

## 2024-06-16 DIAGNOSIS — Z1283 Encounter for screening for malignant neoplasm of skin: Secondary | ICD-10-CM | POA: Diagnosis not present

## 2024-06-16 DIAGNOSIS — Z08 Encounter for follow-up examination after completed treatment for malignant neoplasm: Secondary | ICD-10-CM | POA: Diagnosis not present

## 2024-06-16 DIAGNOSIS — D225 Melanocytic nevi of trunk: Secondary | ICD-10-CM | POA: Diagnosis not present

## 2024-06-21 DIAGNOSIS — J849 Interstitial pulmonary disease, unspecified: Secondary | ICD-10-CM | POA: Diagnosis not present

## 2024-06-21 DIAGNOSIS — S32010D Wedge compression fracture of first lumbar vertebra, subsequent encounter for fracture with routine healing: Secondary | ICD-10-CM | POA: Diagnosis not present

## 2024-06-21 DIAGNOSIS — K5100 Ulcerative (chronic) pancolitis without complications: Secondary | ICD-10-CM | POA: Diagnosis not present

## 2024-06-21 DIAGNOSIS — E782 Mixed hyperlipidemia: Secondary | ICD-10-CM | POA: Diagnosis not present

## 2024-06-21 DIAGNOSIS — K7581 Nonalcoholic steatohepatitis (NASH): Secondary | ICD-10-CM | POA: Diagnosis not present

## 2024-06-23 ENCOUNTER — Encounter: Admitting: Internal Medicine

## 2024-06-24 ENCOUNTER — Other Ambulatory Visit (HOSPITAL_COMMUNITY): Payer: Self-pay | Admitting: Family Medicine

## 2024-06-24 DIAGNOSIS — K5100 Ulcerative (chronic) pancolitis without complications: Secondary | ICD-10-CM | POA: Diagnosis not present

## 2024-06-24 DIAGNOSIS — Z86711 Personal history of pulmonary embolism: Secondary | ICD-10-CM | POA: Diagnosis not present

## 2024-06-24 DIAGNOSIS — S32020D Wedge compression fracture of second lumbar vertebra, subsequent encounter for fracture with routine healing: Secondary | ICD-10-CM | POA: Diagnosis not present

## 2024-06-24 DIAGNOSIS — Z7901 Long term (current) use of anticoagulants: Secondary | ICD-10-CM | POA: Diagnosis not present

## 2024-06-24 DIAGNOSIS — Z950 Presence of cardiac pacemaker: Secondary | ICD-10-CM | POA: Diagnosis not present

## 2024-06-24 DIAGNOSIS — E1165 Type 2 diabetes mellitus with hyperglycemia: Secondary | ICD-10-CM | POA: Diagnosis not present

## 2024-06-24 DIAGNOSIS — K2100 Gastro-esophageal reflux disease with esophagitis, without bleeding: Secondary | ICD-10-CM | POA: Diagnosis not present

## 2024-06-24 DIAGNOSIS — E1122 Type 2 diabetes mellitus with diabetic chronic kidney disease: Secondary | ICD-10-CM | POA: Diagnosis not present

## 2024-06-24 DIAGNOSIS — K222 Esophageal obstruction: Secondary | ICD-10-CM | POA: Diagnosis not present

## 2024-06-24 DIAGNOSIS — S32010D Wedge compression fracture of first lumbar vertebra, subsequent encounter for fracture with routine healing: Secondary | ICD-10-CM | POA: Diagnosis not present

## 2024-06-24 DIAGNOSIS — I129 Hypertensive chronic kidney disease with stage 1 through stage 4 chronic kidney disease, or unspecified chronic kidney disease: Secondary | ICD-10-CM | POA: Diagnosis not present

## 2024-06-24 DIAGNOSIS — J849 Interstitial pulmonary disease, unspecified: Secondary | ICD-10-CM | POA: Diagnosis not present

## 2024-06-24 DIAGNOSIS — I10 Essential (primary) hypertension: Secondary | ICD-10-CM | POA: Diagnosis not present

## 2024-06-24 DIAGNOSIS — E782 Mixed hyperlipidemia: Secondary | ICD-10-CM | POA: Diagnosis not present

## 2024-06-24 DIAGNOSIS — K219 Gastro-esophageal reflux disease without esophagitis: Secondary | ICD-10-CM | POA: Diagnosis not present

## 2024-06-24 DIAGNOSIS — D631 Anemia in chronic kidney disease: Secondary | ICD-10-CM | POA: Diagnosis not present

## 2024-06-24 DIAGNOSIS — G4733 Obstructive sleep apnea (adult) (pediatric): Secondary | ICD-10-CM | POA: Diagnosis not present

## 2024-06-24 DIAGNOSIS — H409 Unspecified glaucoma: Secondary | ICD-10-CM | POA: Diagnosis not present

## 2024-06-24 DIAGNOSIS — Z7984 Long term (current) use of oral hypoglycemic drugs: Secondary | ICD-10-CM | POA: Diagnosis not present

## 2024-06-24 DIAGNOSIS — M545 Low back pain, unspecified: Secondary | ICD-10-CM

## 2024-06-24 DIAGNOSIS — I82409 Acute embolism and thrombosis of unspecified deep veins of unspecified lower extremity: Secondary | ICD-10-CM | POA: Diagnosis not present

## 2024-06-24 DIAGNOSIS — Z556 Problems related to health literacy: Secondary | ICD-10-CM | POA: Diagnosis not present

## 2024-06-24 DIAGNOSIS — G43909 Migraine, unspecified, not intractable, without status migrainosus: Secondary | ICD-10-CM | POA: Diagnosis not present

## 2024-06-24 DIAGNOSIS — N1831 Chronic kidney disease, stage 3a: Secondary | ICD-10-CM | POA: Diagnosis not present

## 2024-06-24 DIAGNOSIS — K5190 Ulcerative colitis, unspecified, without complications: Secondary | ICD-10-CM | POA: Diagnosis not present

## 2024-06-24 DIAGNOSIS — Z9181 History of falling: Secondary | ICD-10-CM | POA: Diagnosis not present

## 2024-06-27 ENCOUNTER — Other Ambulatory Visit: Payer: Self-pay

## 2024-06-27 ENCOUNTER — Telehealth: Payer: Self-pay | Admitting: Urology

## 2024-06-27 ENCOUNTER — Ambulatory Visit (HOSPITAL_COMMUNITY)
Admission: RE | Admit: 2024-06-27 | Discharge: 2024-06-27 | Disposition: A | Discharge status: Home / Self Care | Source: Ambulatory Visit | Attending: Urology | Admitting: Urology

## 2024-06-27 DIAGNOSIS — S32010D Wedge compression fracture of first lumbar vertebra, subsequent encounter for fracture with routine healing: Secondary | ICD-10-CM | POA: Diagnosis not present

## 2024-06-27 DIAGNOSIS — Z86711 Personal history of pulmonary embolism: Secondary | ICD-10-CM | POA: Diagnosis not present

## 2024-06-27 DIAGNOSIS — H409 Unspecified glaucoma: Secondary | ICD-10-CM | POA: Diagnosis not present

## 2024-06-27 DIAGNOSIS — N200 Calculus of kidney: Secondary | ICD-10-CM | POA: Insufficient documentation

## 2024-06-27 DIAGNOSIS — D631 Anemia in chronic kidney disease: Secondary | ICD-10-CM | POA: Diagnosis not present

## 2024-06-27 DIAGNOSIS — K219 Gastro-esophageal reflux disease without esophagitis: Secondary | ICD-10-CM | POA: Diagnosis not present

## 2024-06-27 DIAGNOSIS — Z556 Problems related to health literacy: Secondary | ICD-10-CM | POA: Diagnosis not present

## 2024-06-27 DIAGNOSIS — G4733 Obstructive sleep apnea (adult) (pediatric): Secondary | ICD-10-CM | POA: Diagnosis not present

## 2024-06-27 DIAGNOSIS — J849 Interstitial pulmonary disease, unspecified: Secondary | ICD-10-CM | POA: Diagnosis not present

## 2024-06-27 DIAGNOSIS — K5190 Ulcerative colitis, unspecified, without complications: Secondary | ICD-10-CM | POA: Diagnosis not present

## 2024-06-27 DIAGNOSIS — I129 Hypertensive chronic kidney disease with stage 1 through stage 4 chronic kidney disease, or unspecified chronic kidney disease: Secondary | ICD-10-CM | POA: Diagnosis not present

## 2024-06-27 DIAGNOSIS — Z7901 Long term (current) use of anticoagulants: Secondary | ICD-10-CM | POA: Diagnosis not present

## 2024-06-27 DIAGNOSIS — E1165 Type 2 diabetes mellitus with hyperglycemia: Secondary | ICD-10-CM | POA: Diagnosis not present

## 2024-06-27 DIAGNOSIS — N1831 Chronic kidney disease, stage 3a: Secondary | ICD-10-CM | POA: Diagnosis not present

## 2024-06-27 DIAGNOSIS — Z7984 Long term (current) use of oral hypoglycemic drugs: Secondary | ICD-10-CM | POA: Diagnosis not present

## 2024-06-27 DIAGNOSIS — E1122 Type 2 diabetes mellitus with diabetic chronic kidney disease: Secondary | ICD-10-CM | POA: Diagnosis not present

## 2024-06-27 DIAGNOSIS — E782 Mixed hyperlipidemia: Secondary | ICD-10-CM | POA: Diagnosis not present

## 2024-06-27 DIAGNOSIS — K222 Esophageal obstruction: Secondary | ICD-10-CM | POA: Diagnosis not present

## 2024-06-27 DIAGNOSIS — Z9181 History of falling: Secondary | ICD-10-CM | POA: Diagnosis not present

## 2024-06-27 DIAGNOSIS — R109 Unspecified abdominal pain: Secondary | ICD-10-CM | POA: Diagnosis not present

## 2024-06-27 NOTE — Telephone Encounter (Signed)
 Wife thinks he has a kidney stone and he is in extreme pain. Wants to get the process started since he has had them before. I informed her we do not have any openings but she wanted to talk to a nurse.

## 2024-06-27 NOTE — Telephone Encounter (Signed)
 Wife states patient is having lower back pain,  frequent urination, and feels like something is moving in his lower back. Patient has a history of kidney stones and feels like he has one. Denies fever.  Per provider patient is advised to go get kub and schedule prior ordered renal us  before upcoming appointment.  Wife made aware once provider receives results someone will reach out with his recommendations.  Wife voiced understanding.

## 2024-06-28 NOTE — Telephone Encounter (Signed)
 Wife called and made aware of patients results per provider. Wife voiced understanding.

## 2024-06-28 NOTE — Telephone Encounter (Signed)
 Wife called checking on status of xray result

## 2024-06-29 DIAGNOSIS — Z9181 History of falling: Secondary | ICD-10-CM | POA: Diagnosis not present

## 2024-06-29 DIAGNOSIS — S32010D Wedge compression fracture of first lumbar vertebra, subsequent encounter for fracture with routine healing: Secondary | ICD-10-CM | POA: Diagnosis not present

## 2024-06-29 DIAGNOSIS — R296 Repeated falls: Secondary | ICD-10-CM | POA: Diagnosis not present

## 2024-06-29 DIAGNOSIS — R300 Dysuria: Secondary | ICD-10-CM | POA: Diagnosis not present

## 2024-07-01 ENCOUNTER — Emergency Department (HOSPITAL_COMMUNITY)

## 2024-07-01 ENCOUNTER — Other Ambulatory Visit: Payer: Self-pay

## 2024-07-01 ENCOUNTER — Emergency Department (HOSPITAL_COMMUNITY)
Admission: EM | Admit: 2024-07-01 | Discharge: 2024-07-01 | Disposition: A | Discharge status: Home / Self Care | Attending: Emergency Medicine | Admitting: Emergency Medicine

## 2024-07-01 ENCOUNTER — Encounter (HOSPITAL_COMMUNITY): Payer: Self-pay

## 2024-07-01 DIAGNOSIS — E119 Type 2 diabetes mellitus without complications: Secondary | ICD-10-CM | POA: Diagnosis not present

## 2024-07-01 DIAGNOSIS — Z043 Encounter for examination and observation following other accident: Secondary | ICD-10-CM | POA: Diagnosis not present

## 2024-07-01 DIAGNOSIS — R262 Difficulty in walking, not elsewhere classified: Secondary | ICD-10-CM | POA: Diagnosis not present

## 2024-07-01 DIAGNOSIS — M545 Low back pain, unspecified: Secondary | ICD-10-CM | POA: Diagnosis not present

## 2024-07-01 DIAGNOSIS — G8929 Other chronic pain: Secondary | ICD-10-CM | POA: Insufficient documentation

## 2024-07-01 DIAGNOSIS — R2681 Unsteadiness on feet: Secondary | ICD-10-CM | POA: Diagnosis not present

## 2024-07-01 DIAGNOSIS — Z87891 Personal history of nicotine dependence: Secondary | ICD-10-CM | POA: Insufficient documentation

## 2024-07-01 DIAGNOSIS — Z950 Presence of cardiac pacemaker: Secondary | ICD-10-CM | POA: Diagnosis not present

## 2024-07-01 DIAGNOSIS — W19XXXA Unspecified fall, initial encounter: Secondary | ICD-10-CM | POA: Insufficient documentation

## 2024-07-01 DIAGNOSIS — I10 Essential (primary) hypertension: Secondary | ICD-10-CM | POA: Insufficient documentation

## 2024-07-01 DIAGNOSIS — M4856XA Collapsed vertebra, not elsewhere classified, lumbar region, initial encounter for fracture: Secondary | ICD-10-CM | POA: Diagnosis not present

## 2024-07-01 DIAGNOSIS — Z789 Other specified health status: Secondary | ICD-10-CM | POA: Diagnosis not present

## 2024-07-01 DIAGNOSIS — Z7409 Other reduced mobility: Secondary | ICD-10-CM | POA: Diagnosis not present

## 2024-07-01 DIAGNOSIS — R531 Weakness: Secondary | ICD-10-CM | POA: Diagnosis not present

## 2024-07-01 DIAGNOSIS — M5459 Other low back pain: Secondary | ICD-10-CM | POA: Diagnosis not present

## 2024-07-01 DIAGNOSIS — Z9181 History of falling: Secondary | ICD-10-CM | POA: Diagnosis not present

## 2024-07-01 DIAGNOSIS — I7090 Unspecified atherosclerosis: Secondary | ICD-10-CM | POA: Diagnosis not present

## 2024-07-01 DIAGNOSIS — M549 Dorsalgia, unspecified: Secondary | ICD-10-CM | POA: Diagnosis not present

## 2024-07-01 DIAGNOSIS — M8588 Other specified disorders of bone density and structure, other site: Secondary | ICD-10-CM | POA: Diagnosis not present

## 2024-07-01 DIAGNOSIS — I700 Atherosclerosis of aorta: Secondary | ICD-10-CM | POA: Diagnosis not present

## 2024-07-01 DIAGNOSIS — M25562 Pain in left knee: Secondary | ICD-10-CM | POA: Diagnosis not present

## 2024-07-01 LAB — CBG MONITORING, ED: Glucose-Capillary: 204 mg/dL — ABNORMAL HIGH (ref 70–99)

## 2024-07-01 MED ORDER — OXYCODONE-ACETAMINOPHEN 5-325 MG PO TABS
1.0000 | ORAL_TABLET | Freq: Once | ORAL | Status: AC
  Administered 2024-07-01: 1 via ORAL
  Filled 2024-07-01: qty 1

## 2024-07-01 MED ORDER — OXYCODONE-ACETAMINOPHEN 5-325 MG PO TABS: 1.0000 | ORAL_TABLET | Freq: Three times a day (TID) | ORAL | 0 refills | Status: AC | PRN

## 2024-07-01 MED ORDER — MIDAZOLAM HCL 2 MG/2ML IJ SOLN
2.0000 mg | Freq: Once | INTRAMUSCULAR | Status: AC
  Administered 2024-07-01: 2 mg via INTRAVENOUS
  Filled 2024-07-01: qty 2

## 2024-07-01 MED ORDER — HYDROMORPHONE HCL 1 MG/ML IJ SOLN
1.0000 mg | Freq: Once | INTRAMUSCULAR | Status: AC
  Administered 2024-07-01: 1 mg via INTRAMUSCULAR
  Filled 2024-07-01: qty 1

## 2024-07-01 NOTE — ED Notes (Signed)
 Patient transported to CT

## 2024-07-01 NOTE — ED Notes (Signed)
 Pt grimacing. Reports worsening pain. Now 8/10. Describes pain as spams. No relief with repositioning by this RN. EDP informed. Medication given see MAR

## 2024-07-01 NOTE — Discharge Instructions (Addendum)
 You were evaluated in the Emergency Department and after careful evaluation, we did not find any emergent condition requiring admission or further testing in the hospital.  Your exam/testing today is overall reassuring.  CT scan did not show any significant injuries.  Keep your follow-up as we discussed.  Instead of tramadol , take oxycodone  that is prescribed.  Please return to the Emergency Department if you experience any worsening of your condition.   Thank you for allowing us  to be a part of your care.

## 2024-07-01 NOTE — ED Notes (Signed)
 After versed  pt spo2 decreased to 85% on room air. Per s/o pt has copd and is on cpap. Pt HOB > 45 degrees. Placed on 2 L nasal Cannula. Pt spO2 increased ton 92-93%. Pt remains on vs monitor. Has call bell within reach

## 2024-07-01 NOTE — ED Triage Notes (Signed)
 Rcems from home cc of fall. Patient fell while walking from the bathroom to the living room Patient had a lumbar fusion a couple months ago. Ems reported and unsteady gait when they assisted him out of the floor.  Patient initially did not want to come to the hospital but his increased chronic back pain (usually a 4 or5/10 not a 6/10) and dyspnea on exertion due to lung disease made him come in .  20g l fa
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
## 2024-07-01 NOTE — ED Notes (Signed)
 Pt transferred onto room air. Remains 95-98%

## 2024-07-01 NOTE — ED Notes (Signed)
 Pt's wife states pt is diabetic and hasn't had anything to eat since dinner yesterday. This nurse checked the patient's cbg. Cbg was 204.

## 2024-07-01 NOTE — ED Provider Notes (Signed)
  Physical Exam  BP (!) 140/84   Pulse 86   Temp (!) 97.4 F (36.3 C) (Axillary)   Resp 13   Ht 6' (1.829 m)   Wt 101 kg   SpO2 94%   BMI 30.20 kg/m   Physical Exam  Procedures  Procedures  ED Course / MDM    Medical Decision Making Amount and/or Complexity of Data Reviewed Radiology: ordered.  Risk Prescription drug management.   Patient was seen by overnight team.  Patient has chronic back pain.  Recently was admitted to the hospital because of back pain, was found to have compression fracture.  He is status post kyphoplasty.  Patient was then admitted to rehab.  According to the patient's wife, he initially did have some improvement with kyphoplasty, however over the last several weeks, the pain has intensified again.  Patient had a mechanical fall yesterday.  He is currently taking tramadol , but that is not helping.  PCP is set up repeat MRI.  That MRI cannot be done until September.  Patient has a barrier of pacemaker, that requires that the MRIs done at [facility].  Patient had imaging done by the overnight team that is reassuring.  There is no new injury. He was able to stand up, take few steps.  Patient started complaining of knee pain, therefore I was advised to see the patient after he was already discharged.  On my evaluation, the knee is overall reassuring.  We will still get x-ray of the knee since there was some blunt trauma.  X-ray of the knee is reassuring.  I give patient some Percocet, and reassessed him again and he is feeling better.  He still in pain.  I discussed with him if he would like to be considered for rehab facility.  Patient actually has long-term care insurance, but prefers not going to rehab facility.  He already has home health in place according to wife.  At this time, I do not think I can get an emergent MRI at [facility], especially given the pacemaker issue.  Patient has no admissible criteria.  He does not want to stay in the ED for TOC,  as placement is not what they desire.   Plan will be to give him oxycodone  instead of tramadol  for as needed pain control and for persistent attempt from the family to see if the MRI can be done sooner.  I went over return precautions.  Advised great caution at home, and some conversation between family about long-term management, as it is possible that this pain might become the new norm.  Family will return to the ER if they change their mind.       Charlyn Sora, MD 07/01/24 [phone]

## 2024-07-04 ENCOUNTER — Ambulatory Visit (HOSPITAL_COMMUNITY): Admission: RE | Admit: 2024-07-04 | Source: Ambulatory Visit

## 2024-07-04 DIAGNOSIS — N1831 Chronic kidney disease, stage 3a: Secondary | ICD-10-CM | POA: Diagnosis not present

## 2024-07-04 DIAGNOSIS — Z86711 Personal history of pulmonary embolism: Secondary | ICD-10-CM | POA: Diagnosis not present

## 2024-07-04 DIAGNOSIS — Z7984 Long term (current) use of oral hypoglycemic drugs: Secondary | ICD-10-CM | POA: Diagnosis not present

## 2024-07-04 DIAGNOSIS — S32010D Wedge compression fracture of first lumbar vertebra, subsequent encounter for fracture with routine healing: Secondary | ICD-10-CM | POA: Diagnosis not present

## 2024-07-04 DIAGNOSIS — I129 Hypertensive chronic kidney disease with stage 1 through stage 4 chronic kidney disease, or unspecified chronic kidney disease: Secondary | ICD-10-CM | POA: Diagnosis not present

## 2024-07-04 DIAGNOSIS — E1165 Type 2 diabetes mellitus with hyperglycemia: Secondary | ICD-10-CM | POA: Diagnosis not present

## 2024-07-04 DIAGNOSIS — E1122 Type 2 diabetes mellitus with diabetic chronic kidney disease: Secondary | ICD-10-CM | POA: Diagnosis not present

## 2024-07-04 DIAGNOSIS — Z556 Problems related to health literacy: Secondary | ICD-10-CM | POA: Diagnosis not present

## 2024-07-04 DIAGNOSIS — G4733 Obstructive sleep apnea (adult) (pediatric): Secondary | ICD-10-CM | POA: Diagnosis not present

## 2024-07-04 DIAGNOSIS — I480 Paroxysmal atrial fibrillation: Secondary | ICD-10-CM | POA: Diagnosis not present

## 2024-07-04 DIAGNOSIS — J849 Interstitial pulmonary disease, unspecified: Secondary | ICD-10-CM | POA: Diagnosis not present

## 2024-07-04 DIAGNOSIS — Z9181 History of falling: Secondary | ICD-10-CM | POA: Diagnosis not present

## 2024-07-04 DIAGNOSIS — Z7901 Long term (current) use of anticoagulants: Secondary | ICD-10-CM | POA: Diagnosis not present

## 2024-07-04 DIAGNOSIS — D631 Anemia in chronic kidney disease: Secondary | ICD-10-CM | POA: Diagnosis not present

## 2024-07-04 DIAGNOSIS — K219 Gastro-esophageal reflux disease without esophagitis: Secondary | ICD-10-CM | POA: Diagnosis not present

## 2024-07-04 DIAGNOSIS — K222 Esophageal obstruction: Secondary | ICD-10-CM | POA: Diagnosis not present

## 2024-07-04 DIAGNOSIS — H409 Unspecified glaucoma: Secondary | ICD-10-CM | POA: Diagnosis not present

## 2024-07-04 DIAGNOSIS — E782 Mixed hyperlipidemia: Secondary | ICD-10-CM | POA: Diagnosis not present

## 2024-07-04 DIAGNOSIS — K5190 Ulcerative colitis, unspecified, without complications: Secondary | ICD-10-CM | POA: Diagnosis not present

## 2024-07-05 DIAGNOSIS — N1831 Chronic kidney disease, stage 3a: Secondary | ICD-10-CM | POA: Diagnosis not present

## 2024-07-05 DIAGNOSIS — K219 Gastro-esophageal reflux disease without esophagitis: Secondary | ICD-10-CM | POA: Diagnosis not present

## 2024-07-05 DIAGNOSIS — K5900 Constipation, unspecified: Secondary | ICD-10-CM | POA: Diagnosis not present

## 2024-07-05 DIAGNOSIS — K5190 Ulcerative colitis, unspecified, without complications: Secondary | ICD-10-CM | POA: Diagnosis not present

## 2024-07-05 DIAGNOSIS — E782 Mixed hyperlipidemia: Secondary | ICD-10-CM | POA: Diagnosis not present

## 2024-07-05 DIAGNOSIS — R410 Disorientation, unspecified: Secondary | ICD-10-CM | POA: Diagnosis not present

## 2024-07-05 DIAGNOSIS — I480 Paroxysmal atrial fibrillation: Secondary | ICD-10-CM | POA: Diagnosis not present

## 2024-07-05 DIAGNOSIS — J849 Interstitial pulmonary disease, unspecified: Secondary | ICD-10-CM | POA: Diagnosis not present

## 2024-07-05 DIAGNOSIS — G4733 Obstructive sleep apnea (adult) (pediatric): Secondary | ICD-10-CM | POA: Diagnosis not present

## 2024-07-05 DIAGNOSIS — S32010D Wedge compression fracture of first lumbar vertebra, subsequent encounter for fracture with routine healing: Secondary | ICD-10-CM | POA: Diagnosis not present

## 2024-07-05 DIAGNOSIS — E1165 Type 2 diabetes mellitus with hyperglycemia: Secondary | ICD-10-CM | POA: Diagnosis not present

## 2024-07-05 DIAGNOSIS — I129 Hypertensive chronic kidney disease with stage 1 through stage 4 chronic kidney disease, or unspecified chronic kidney disease: Secondary | ICD-10-CM | POA: Diagnosis not present

## 2024-07-05 DIAGNOSIS — D631 Anemia in chronic kidney disease: Secondary | ICD-10-CM | POA: Diagnosis not present

## 2024-07-05 DIAGNOSIS — E1122 Type 2 diabetes mellitus with diabetic chronic kidney disease: Secondary | ICD-10-CM | POA: Diagnosis not present

## 2024-07-06 ENCOUNTER — Ambulatory Visit: Attending: Cardiovascular Disease | Admitting: Internal Medicine

## 2024-07-06 ENCOUNTER — Encounter: Payer: Self-pay | Admitting: Internal Medicine

## 2024-07-06 VITALS — BP 112/64 | HR 94 | Ht 72.0 in

## 2024-07-06 DIAGNOSIS — I442 Atrioventricular block, complete: Secondary | ICD-10-CM | POA: Diagnosis not present

## 2024-07-06 LAB — CUP PACEART INCLINIC DEVICE CHECK
Date Time Interrogation Session: 20250827155719
Implantable Lead Connection Status: 753985
Implantable Lead Connection Status: 753985
Implantable Lead Implant Date: 20221222
Implantable Lead Implant Date: 20221222
Implantable Lead Location: 753858
Implantable Lead Location: 753859
Implantable Lead Model: 377171
Implantable Lead Model: 377171
Implantable Lead Serial Number: 8000640340
Implantable Lead Serial Number: 8000648576
Implantable Pulse Generator Implant Date: 20221222
Pulse Gen Model: 407145
Pulse Gen Serial Number: 70300615

## 2024-07-06 NOTE — Patient Instructions (Addendum)
 Medication Instructions:  Your physician has recommended you make the following change in your medication:  Stop Amlodipine   Lab Work: None ordered.  You may go to any [facility] Location for your lab work:  [facility] - [address] ([facility]) - [address] [phone] [address]  [place] - [address]   [place]  - [address]   [place] - [address] - [address] [employer] [place]  - [address] ([facility]   If you have labs (blood work) drawn today and your tests are completely normal, you will receive your results only by: [employer] (if you have MyChart)  If you have any lab test that is abnormal or we need to change your treatment, we will call you or send a MyChart message to review the results.  Testing/Procedures: None ordered.  Follow-Up: At [facility], you and your health needs are our priority.  As part of our continuing mission to provide you with exceptional heart care, we have created designated Provider Care Teams.  These Care Teams include your primary Cardiologist (physician) and Advanced Practice Providers (APPs -  Physician Assistants and Nurse Practitioners) who all work together to provide you with the care you need, when you need it.  Your next appointment:   1 year(s)  The format for your next appointment:   In Person  Provider:   Dr Nancey,  or one of the following Advanced Practice Providers on your designated Care Team:   Charlies Arthur, PA-C Ozell Jodie Passey, [place] Leotis Barrack, NP  Note: Remote monitoring is used to monitor your Pacemaker/ ICD from home. This monitoring reduces the number of office visits required to check your device to one time per year. It allows us  to keep an eye on the functioning of your device to ensure it is working properly.
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
## 2024-07-08 NOTE — CV Procedure (Signed)
  Device system confirmed to be MRI conditional, with implant date > 6 weeks ago, and no evidence of abandoned or epicardial leads in review of most recent CXR  Device last cleared by EP Provider: Prentice Passey 07/07/2024  Clearance is good through for 1 year as long as parameters remain stable at time of check. If pt undergoes a cardiac device procedure during that time, they should be re-cleared.   Tachy-therapies to be programmed off if applicable with device back to pre-MRI settings after completion of exam.  B[employer] was available remotely to assist in programming recommendations.   Izetta CHRISTELLA Linen, RT  07/08/2024 10:33 AM

## 2024-07-11 DIAGNOSIS — E1122 Type 2 diabetes mellitus with diabetic chronic kidney disease: Secondary | ICD-10-CM | POA: Diagnosis not present

## 2024-07-11 DIAGNOSIS — I10 Essential (primary) hypertension: Secondary | ICD-10-CM | POA: Diagnosis not present

## 2024-07-11 DIAGNOSIS — K2100 Gastro-esophageal reflux disease with esophagitis, without bleeding: Secondary | ICD-10-CM | POA: Diagnosis not present

## 2024-07-11 DIAGNOSIS — E782 Mixed hyperlipidemia: Secondary | ICD-10-CM | POA: Diagnosis not present

## 2024-07-12 ENCOUNTER — Ambulatory Visit (HOSPITAL_COMMUNITY)
Admission: RE | Admit: 2024-07-12 | Discharge: 2024-07-12 | Disposition: A | Discharge status: Home / Self Care | Source: Ambulatory Visit | Attending: Family Medicine | Admitting: Family Medicine

## 2024-07-12 ENCOUNTER — Encounter (HOSPITAL_COMMUNITY): Payer: Self-pay | Admitting: *Deleted

## 2024-07-12 DIAGNOSIS — M5136 Other intervertebral disc degeneration, lumbar region with discogenic back pain only: Secondary | ICD-10-CM

## 2024-07-12 DIAGNOSIS — E1122 Type 2 diabetes mellitus with diabetic chronic kidney disease: Secondary | ICD-10-CM | POA: Diagnosis not present

## 2024-07-12 DIAGNOSIS — E782 Mixed hyperlipidemia: Secondary | ICD-10-CM | POA: Diagnosis not present

## 2024-07-12 DIAGNOSIS — K5900 Constipation, unspecified: Secondary | ICD-10-CM | POA: Diagnosis not present

## 2024-07-12 DIAGNOSIS — Z7409 Other reduced mobility: Secondary | ICD-10-CM | POA: Diagnosis not present

## 2024-07-12 DIAGNOSIS — Z789 Other specified health status: Secondary | ICD-10-CM | POA: Diagnosis not present

## 2024-07-12 DIAGNOSIS — M47816 Spondylosis without myelopathy or radiculopathy, lumbar region: Secondary | ICD-10-CM

## 2024-07-12 DIAGNOSIS — I82409 Acute embolism and thrombosis of unspecified deep veins of unspecified lower extremity: Secondary | ICD-10-CM | POA: Diagnosis not present

## 2024-07-12 DIAGNOSIS — G8929 Other chronic pain: Secondary | ICD-10-CM | POA: Diagnosis not present

## 2024-07-12 DIAGNOSIS — R410 Disorientation, unspecified: Secondary | ICD-10-CM | POA: Diagnosis not present

## 2024-07-12 DIAGNOSIS — Z9181 History of falling: Secondary | ICD-10-CM | POA: Diagnosis not present

## 2024-07-12 DIAGNOSIS — M545 Low back pain, unspecified: Secondary | ICD-10-CM | POA: Insufficient documentation

## 2024-07-12 DIAGNOSIS — I10 Essential (primary) hypertension: Secondary | ICD-10-CM | POA: Diagnosis not present

## 2024-07-12 DIAGNOSIS — K2100 Gastro-esophageal reflux disease with esophagitis, without bleeding: Secondary | ICD-10-CM | POA: Diagnosis not present

## 2024-07-12 NOTE — Progress Notes (Signed)
  Device system confirmed to be MRI conditional, with implant date > 6 weeks ago, and no evidence of abandoned or epicardial leads in review of most recent CXR  Device last cleared by EP Provider: Daphne Barrack, NP [phone]  Clearance is good through for 1 year as long as parameters remain stable at time of check. If pt undergoes a cardiac device procedure during that time, they should be re-cleared.   Tachy-therapies to be programmed off if applicable with device back to pre-MRI settings after completion of exam.  B[employer] was available remotely to assist in programming recommendations.   Manuel Atkins, Manuel Atkins  07/12/2024 8:59 AM

## 2024-07-12 NOTE — CV Procedure (Signed)
 Patient was monitored by this RN during MRI scan due to presence of a pacemaker. Cardiac rhythm was continuously monitored throughout the procedure. Prior to the start of the scan, the pacemaker was placed in MRI-safe mode by the MRI technician. Following the completion of the scan, the device was returned to its pre-MRI settings. Neurological status and orientation post-procedure were unchanged from baseline.   Pre-procedure Heart Rate (Prior to being placed in MRI safe mode): 90 Post-procedure Heart Rate (Once pacemaker is returned to baseline mode):  90

## 2024-07-13 DIAGNOSIS — D631 Anemia in chronic kidney disease: Secondary | ICD-10-CM | POA: Diagnosis not present

## 2024-07-13 DIAGNOSIS — I129 Hypertensive chronic kidney disease with stage 1 through stage 4 chronic kidney disease, or unspecified chronic kidney disease: Secondary | ICD-10-CM | POA: Diagnosis not present

## 2024-07-13 DIAGNOSIS — J849 Interstitial pulmonary disease, unspecified: Secondary | ICD-10-CM | POA: Diagnosis not present

## 2024-07-13 DIAGNOSIS — K222 Esophageal obstruction: Secondary | ICD-10-CM | POA: Diagnosis not present

## 2024-07-13 DIAGNOSIS — Z7984 Long term (current) use of oral hypoglycemic drugs: Secondary | ICD-10-CM | POA: Diagnosis not present

## 2024-07-13 DIAGNOSIS — Z9181 History of falling: Secondary | ICD-10-CM | POA: Diagnosis not present

## 2024-07-13 DIAGNOSIS — H409 Unspecified glaucoma: Secondary | ICD-10-CM | POA: Diagnosis not present

## 2024-07-13 DIAGNOSIS — Z86711 Personal history of pulmonary embolism: Secondary | ICD-10-CM | POA: Diagnosis not present

## 2024-07-13 DIAGNOSIS — Z556 Problems related to health literacy: Secondary | ICD-10-CM | POA: Diagnosis not present

## 2024-07-13 DIAGNOSIS — E1122 Type 2 diabetes mellitus with diabetic chronic kidney disease: Secondary | ICD-10-CM | POA: Diagnosis not present

## 2024-07-13 DIAGNOSIS — G4733 Obstructive sleep apnea (adult) (pediatric): Secondary | ICD-10-CM | POA: Diagnosis not present

## 2024-07-13 DIAGNOSIS — K5190 Ulcerative colitis, unspecified, without complications: Secondary | ICD-10-CM | POA: Diagnosis not present

## 2024-07-13 DIAGNOSIS — E782 Mixed hyperlipidemia: Secondary | ICD-10-CM | POA: Diagnosis not present

## 2024-07-13 DIAGNOSIS — Z7901 Long term (current) use of anticoagulants: Secondary | ICD-10-CM | POA: Diagnosis not present

## 2024-07-13 DIAGNOSIS — I480 Paroxysmal atrial fibrillation: Secondary | ICD-10-CM | POA: Diagnosis not present

## 2024-07-13 DIAGNOSIS — E1165 Type 2 diabetes mellitus with hyperglycemia: Secondary | ICD-10-CM | POA: Diagnosis not present

## 2024-07-13 DIAGNOSIS — N1831 Chronic kidney disease, stage 3a: Secondary | ICD-10-CM | POA: Diagnosis not present

## 2024-07-13 DIAGNOSIS — K219 Gastro-esophageal reflux disease without esophagitis: Secondary | ICD-10-CM | POA: Diagnosis not present

## 2024-07-19 DIAGNOSIS — K5100 Ulcerative (chronic) pancolitis without complications: Secondary | ICD-10-CM | POA: Diagnosis not present

## 2024-07-19 DIAGNOSIS — J849 Interstitial pulmonary disease, unspecified: Secondary | ICD-10-CM | POA: Diagnosis not present

## 2024-07-19 DIAGNOSIS — K7581 Nonalcoholic steatohepatitis (NASH): Secondary | ICD-10-CM | POA: Diagnosis not present

## 2024-07-19 DIAGNOSIS — E782 Mixed hyperlipidemia: Secondary | ICD-10-CM | POA: Diagnosis not present

## 2024-07-22 ENCOUNTER — Ambulatory Visit (HOSPITAL_COMMUNITY)

## 2024-07-22 DIAGNOSIS — E7849 Other hyperlipidemia: Secondary | ICD-10-CM | POA: Diagnosis not present

## 2024-07-22 DIAGNOSIS — E1165 Type 2 diabetes mellitus with hyperglycemia: Secondary | ICD-10-CM | POA: Diagnosis not present

## 2024-07-22 DIAGNOSIS — R5383 Other fatigue: Secondary | ICD-10-CM | POA: Diagnosis not present

## 2024-07-22 DIAGNOSIS — E1122 Type 2 diabetes mellitus with diabetic chronic kidney disease: Secondary | ICD-10-CM | POA: Diagnosis not present

## 2024-07-22 DIAGNOSIS — I10 Essential (primary) hypertension: Secondary | ICD-10-CM | POA: Diagnosis not present

## 2024-07-22 DIAGNOSIS — N1832 Chronic kidney disease, stage 3b: Secondary | ICD-10-CM | POA: Diagnosis not present

## 2024-07-26 ENCOUNTER — Telehealth: Payer: Self-pay

## 2024-07-26 DIAGNOSIS — Z789 Other specified health status: Secondary | ICD-10-CM | POA: Diagnosis not present

## 2024-07-26 DIAGNOSIS — G8929 Other chronic pain: Secondary | ICD-10-CM | POA: Diagnosis not present

## 2024-07-26 DIAGNOSIS — I82409 Acute embolism and thrombosis of unspecified deep veins of unspecified lower extremity: Secondary | ICD-10-CM | POA: Diagnosis not present

## 2024-07-26 DIAGNOSIS — M545 Low back pain, unspecified: Secondary | ICD-10-CM | POA: Diagnosis not present

## 2024-07-26 DIAGNOSIS — E1122 Type 2 diabetes mellitus with diabetic chronic kidney disease: Secondary | ICD-10-CM | POA: Diagnosis not present

## 2024-07-26 DIAGNOSIS — K2100 Gastro-esophageal reflux disease with esophagitis, without bleeding: Secondary | ICD-10-CM | POA: Diagnosis not present

## 2024-07-26 DIAGNOSIS — E782 Mixed hyperlipidemia: Secondary | ICD-10-CM | POA: Diagnosis not present

## 2024-07-26 DIAGNOSIS — K5900 Constipation, unspecified: Secondary | ICD-10-CM | POA: Diagnosis not present

## 2024-07-26 DIAGNOSIS — Z9181 History of falling: Secondary | ICD-10-CM | POA: Diagnosis not present

## 2024-07-26 DIAGNOSIS — Z7409 Other reduced mobility: Secondary | ICD-10-CM | POA: Diagnosis not present

## 2024-07-26 NOTE — Telephone Encounter (Signed)
 Pt wife called to let us  know pt fell the morning of his scheduled US  and did not have it done pt wife wanted to know did the US  need to be completed pt made aware that per last OV note MD McKenzie recommends pt has a US  prior to appt pt wife voiced her understanding stating they will reschedule US  prior to appt pt wife made aware they may not have the results back prior to appt but to still have US  completed as soon as possible

## 2024-07-27 ENCOUNTER — Other Ambulatory Visit (HOSPITAL_COMMUNITY)

## 2024-07-28 ENCOUNTER — Other Ambulatory Visit

## 2024-07-28 ENCOUNTER — Ambulatory Visit (HOSPITAL_COMMUNITY)
Admission: RE | Admit: 2024-07-28 | Discharge: 2024-07-28 | Disposition: A | Discharge status: Home / Self Care | Source: Ambulatory Visit | Attending: Urology | Admitting: Urology

## 2024-07-28 ENCOUNTER — Telehealth: Payer: Self-pay

## 2024-07-28 DIAGNOSIS — R399 Unspecified symptoms and signs involving the genitourinary system: Secondary | ICD-10-CM

## 2024-07-28 DIAGNOSIS — N200 Calculus of kidney: Secondary | ICD-10-CM | POA: Diagnosis not present

## 2024-07-28 LAB — URINALYSIS, ROUTINE W REFLEX MICROSCOPIC
Bilirubin, UA: NEGATIVE
Ketones, UA: NEGATIVE
Leukocytes,UA: NEGATIVE
Nitrite, UA: NEGATIVE
Protein,UA: NEGATIVE
RBC, UA: NEGATIVE
Specific Gravity, UA: 1.01 (ref 1.005–1.030)
Urobilinogen, Ur: 0.2 mg/dL (ref 0.2–1.0)
pH, UA: 6 (ref 5.0–7.5)

## 2024-07-28 NOTE — Telephone Encounter (Signed)
 Dysuria  Patient called with c/o dysuria x 2-3 days.  Pain: burning  Severity:7/10  Associated Signs and Symptoms:  Fever: no Chills: no Hematuria: no Urgency: yes Frequency: yes Hesitancy:starts and stops Incontinence: yes Nausea: no Vomiting: no  Urologic History:  Any Recent Urologic Surgeries or Procedures:no Recurrent UTI's: last one was 3 weeks ago Cystitis: no  Prostatitis:no Kidney or Bladder Stones: yes Plan: Walk-in Clinic: no Appointment w/Physician: [no Lab visit scheduled for urine drop off: Yes Advice given: Have pt come in for ua specimen Do you take on daily medications for UTI suppression No    Pt wife stated she has a cup she could put ua sample in pt wife was advised we need a sterile cup for proper testing pt wife voiced her understanding stating she would come pick a cup pt is a fall risk so she will bring him

## 2024-07-29 ENCOUNTER — Telehealth: Payer: Self-pay

## 2024-07-29 DIAGNOSIS — J849 Interstitial pulmonary disease, unspecified: Secondary | ICD-10-CM | POA: Diagnosis not present

## 2024-07-29 DIAGNOSIS — K7581 Nonalcoholic steatohepatitis (NASH): Secondary | ICD-10-CM | POA: Diagnosis not present

## 2024-07-29 DIAGNOSIS — E782 Mixed hyperlipidemia: Secondary | ICD-10-CM | POA: Diagnosis not present

## 2024-07-29 DIAGNOSIS — K5100 Ulcerative (chronic) pancolitis without complications: Secondary | ICD-10-CM | POA: Diagnosis not present

## 2024-07-29 NOTE — Telephone Encounter (Signed)
 Patient presents today with complaints of  Burning, urgency, frequency, hesitancy start and stop, incontinence with 7/10 pain.  UA done today.  Dr. Sherrilee reviewed results and No Antibiotic needed, pt need to control blood sugars .  Patient aware of MD recommendations.     Pt/wife made aware of results and verbalized understanding  Carlos, CMA

## 2024-07-30 DIAGNOSIS — S32010D Wedge compression fracture of first lumbar vertebra, subsequent encounter for fracture with routine healing: Secondary | ICD-10-CM | POA: Diagnosis not present

## 2024-07-30 DIAGNOSIS — R296 Repeated falls: Secondary | ICD-10-CM | POA: Diagnosis not present

## 2024-07-30 DIAGNOSIS — Z9181 History of falling: Secondary | ICD-10-CM | POA: Diagnosis not present

## 2024-08-03 ENCOUNTER — Encounter: Payer: Self-pay | Admitting: Urology

## 2024-08-03 ENCOUNTER — Ambulatory Visit: Admitting: Urology

## 2024-08-03 VITALS — BP 124/81 | HR 93

## 2024-08-03 DIAGNOSIS — R3915 Urgency of urination: Secondary | ICD-10-CM | POA: Diagnosis not present

## 2024-08-03 DIAGNOSIS — R350 Frequency of micturition: Secondary | ICD-10-CM | POA: Diagnosis not present

## 2024-08-03 DIAGNOSIS — N200 Calculus of kidney: Secondary | ICD-10-CM | POA: Diagnosis not present

## 2024-08-03 DIAGNOSIS — N401 Enlarged prostate with lower urinary tract symptoms: Secondary | ICD-10-CM

## 2024-08-03 MED ORDER — FINASTERIDE 5 MG PO TABS: 5.0000 mg | ORAL_TABLET | Freq: Every day | ORAL | 3 refills | Status: AC

## 2024-08-03 MED ORDER — TAMSULOSIN HCL 0.4 MG PO CAPS: 0.4000 mg | ORAL_CAPSULE | Freq: Two times a day (BID) | ORAL | 11 refills | Status: AC

## 2024-08-03 MED ORDER — MIRABEGRON ER 50 MG PO TB24: 50.0000 mg | ORAL_TABLET | Freq: Every day | ORAL | 11 refills | Status: DC

## 2024-08-03 MED ORDER — PHENAZOPYRIDINE HCL 100 MG PO TABS: 100.0000 mg | ORAL_TABLET | Freq: Every day | ORAL | 0 refills | Status: AC | PRN
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
## 2024-08-03 NOTE — Patient Instructions (Signed)

## 2024-08-08 NOTE — Addendum Note (Signed)
 Encounter addended by: Thayne Consuelo DEL on: 08/08/2024 9:33 AM  Actions taken: Imaging Exam ended

## 2024-08-09 ENCOUNTER — Ambulatory Visit (INDEPENDENT_AMBULATORY_CARE_PROVIDER_SITE_OTHER)

## 2024-08-09 DIAGNOSIS — I442 Atrioventricular block, complete: Secondary | ICD-10-CM

## 2024-08-10 ENCOUNTER — Other Ambulatory Visit: Payer: Self-pay | Admitting: Urology

## 2024-08-10 DIAGNOSIS — N401 Enlarged prostate with lower urinary tract symptoms: Secondary | ICD-10-CM

## 2024-08-11 LAB — CUP PACEART REMOTE DEVICE CHECK
Battery Voltage: 75
Date Time Interrogation Session: 20250930081023
Implantable Lead Connection Status: 753985
Implantable Lead Connection Status: 753985
Implantable Lead Implant Date: 20221222
Implantable Lead Implant Date: 20221222
Implantable Lead Location: 753858
Implantable Lead Location: 753859
Implantable Lead Model: 377171
Implantable Lead Model: 377171
Implantable Lead Serial Number: 8000640340
Implantable Lead Serial Number: 8000648576
Implantable Pulse Generator Implant Date: 20221222
Pulse Gen Model: 407145
Pulse Gen Serial Number: 70300615

## 2024-08-11 NOTE — Progress Notes (Signed)
 Remote PPM Transmission

## 2024-08-14 ENCOUNTER — Ambulatory Visit: Payer: Self-pay | Admitting: Internal Medicine

## 2024-08-15 DIAGNOSIS — Z9181 History of falling: Secondary | ICD-10-CM | POA: Diagnosis not present

## 2024-08-15 DIAGNOSIS — M545 Low back pain, unspecified: Secondary | ICD-10-CM | POA: Diagnosis not present

## 2024-08-15 DIAGNOSIS — M6281 Muscle weakness (generalized): Secondary | ICD-10-CM | POA: Diagnosis not present

## 2024-08-15 DIAGNOSIS — Z4789 Encounter for other orthopedic aftercare: Secondary | ICD-10-CM | POA: Diagnosis not present

## 2024-08-15 NOTE — Progress Notes (Signed)
 Remote ICD Transmission

## 2024-08-19 ENCOUNTER — Telehealth: Payer: Self-pay

## 2024-08-19 NOTE — Telephone Encounter (Signed)
 Retun call to pt from vm about increasing Myrbetriq  to 50 mg. Pt/wife is made aware the medication was increase from 25 mg to 50 mg. Voiced understanding.

## 2024-08-22 DIAGNOSIS — G8929 Other chronic pain: Secondary | ICD-10-CM | POA: Diagnosis not present

## 2024-08-22 DIAGNOSIS — Z789 Other specified health status: Secondary | ICD-10-CM | POA: Diagnosis not present

## 2024-08-22 DIAGNOSIS — Z7409 Other reduced mobility: Secondary | ICD-10-CM | POA: Diagnosis not present

## 2024-08-22 DIAGNOSIS — I82409 Acute embolism and thrombosis of unspecified deep veins of unspecified lower extremity: Secondary | ICD-10-CM | POA: Diagnosis not present

## 2024-08-22 DIAGNOSIS — M545 Low back pain, unspecified: Secondary | ICD-10-CM | POA: Diagnosis not present

## 2024-08-22 DIAGNOSIS — Z23 Encounter for immunization: Secondary | ICD-10-CM | POA: Diagnosis not present

## 2024-08-22 DIAGNOSIS — M6281 Muscle weakness (generalized): Secondary | ICD-10-CM | POA: Diagnosis not present

## 2024-08-22 DIAGNOSIS — K5900 Constipation, unspecified: Secondary | ICD-10-CM | POA: Diagnosis not present

## 2024-08-22 DIAGNOSIS — K2100 Gastro-esophageal reflux disease with esophagitis, without bleeding: Secondary | ICD-10-CM | POA: Diagnosis not present

## 2024-08-22 DIAGNOSIS — E782 Mixed hyperlipidemia: Secondary | ICD-10-CM | POA: Diagnosis not present

## 2024-08-22 DIAGNOSIS — Z4789 Encounter for other orthopedic aftercare: Secondary | ICD-10-CM | POA: Diagnosis not present

## 2024-08-22 DIAGNOSIS — E1122 Type 2 diabetes mellitus with diabetic chronic kidney disease: Secondary | ICD-10-CM | POA: Diagnosis not present

## 2024-08-22 DIAGNOSIS — Z9181 History of falling: Secondary | ICD-10-CM | POA: Diagnosis not present

## 2024-08-22 DIAGNOSIS — I10 Essential (primary) hypertension: Secondary | ICD-10-CM | POA: Diagnosis not present

## 2024-08-29 DIAGNOSIS — S32010D Wedge compression fracture of first lumbar vertebra, subsequent encounter for fracture with routine healing: Secondary | ICD-10-CM | POA: Diagnosis not present

## 2024-08-29 DIAGNOSIS — R296 Repeated falls: Secondary | ICD-10-CM | POA: Diagnosis not present

## 2024-08-29 DIAGNOSIS — Z4789 Encounter for other orthopedic aftercare: Secondary | ICD-10-CM | POA: Diagnosis not present

## 2024-08-29 DIAGNOSIS — M545 Low back pain, unspecified: Secondary | ICD-10-CM | POA: Diagnosis not present

## 2024-08-29 DIAGNOSIS — Z9181 History of falling: Secondary | ICD-10-CM | POA: Diagnosis not present

## 2024-08-29 DIAGNOSIS — M6281 Muscle weakness (generalized): Secondary | ICD-10-CM | POA: Diagnosis not present

## 2024-08-31 DIAGNOSIS — Z9181 History of falling: Secondary | ICD-10-CM | POA: Diagnosis not present

## 2024-08-31 DIAGNOSIS — M545 Low back pain, unspecified: Secondary | ICD-10-CM | POA: Diagnosis not present

## 2024-08-31 DIAGNOSIS — Z4789 Encounter for other orthopedic aftercare: Secondary | ICD-10-CM | POA: Diagnosis not present

## 2024-08-31 DIAGNOSIS — M6281 Muscle weakness (generalized): Secondary | ICD-10-CM | POA: Diagnosis not present

## 2024-09-01 DIAGNOSIS — K7581 Nonalcoholic steatohepatitis (NASH): Secondary | ICD-10-CM | POA: Diagnosis not present

## 2024-09-01 DIAGNOSIS — Z6824 Body mass index (BMI) 24.0-24.9, adult: Secondary | ICD-10-CM | POA: Diagnosis not present

## 2024-09-01 DIAGNOSIS — E1122 Type 2 diabetes mellitus with diabetic chronic kidney disease: Secondary | ICD-10-CM | POA: Diagnosis not present

## 2024-09-01 DIAGNOSIS — G43909 Migraine, unspecified, not intractable, without status migrainosus: Secondary | ICD-10-CM | POA: Diagnosis not present

## 2024-09-01 DIAGNOSIS — G4733 Obstructive sleep apnea (adult) (pediatric): Secondary | ICD-10-CM | POA: Diagnosis not present

## 2024-09-01 DIAGNOSIS — K5100 Ulcerative (chronic) pancolitis without complications: Secondary | ICD-10-CM | POA: Diagnosis not present

## 2024-09-01 DIAGNOSIS — S32010D Wedge compression fracture of first lumbar vertebra, subsequent encounter for fracture with routine healing: Secondary | ICD-10-CM | POA: Diagnosis not present

## 2024-09-01 DIAGNOSIS — N1832 Chronic kidney disease, stage 3b: Secondary | ICD-10-CM | POA: Diagnosis not present

## 2024-09-01 DIAGNOSIS — J849 Interstitial pulmonary disease, unspecified: Secondary | ICD-10-CM | POA: Diagnosis not present

## 2024-09-01 DIAGNOSIS — I82409 Acute embolism and thrombosis of unspecified deep veins of unspecified lower extremity: Secondary | ICD-10-CM | POA: Diagnosis not present

## 2024-09-05 DIAGNOSIS — Z9181 History of falling: Secondary | ICD-10-CM | POA: Diagnosis not present

## 2024-09-05 DIAGNOSIS — M6281 Muscle weakness (generalized): Secondary | ICD-10-CM | POA: Diagnosis not present

## 2024-09-05 DIAGNOSIS — M545 Low back pain, unspecified: Secondary | ICD-10-CM | POA: Diagnosis not present

## 2024-09-05 DIAGNOSIS — Z4789 Encounter for other orthopedic aftercare: Secondary | ICD-10-CM | POA: Diagnosis not present

## 2024-09-06 DIAGNOSIS — H35032 Hypertensive retinopathy, left eye: Secondary | ICD-10-CM | POA: Diagnosis not present

## 2024-09-06 DIAGNOSIS — H5212 Myopia, left eye: Secondary | ICD-10-CM | POA: Diagnosis not present

## 2024-09-07 DIAGNOSIS — Z9181 History of falling: Secondary | ICD-10-CM | POA: Diagnosis not present

## 2024-09-07 DIAGNOSIS — M6281 Muscle weakness (generalized): Secondary | ICD-10-CM | POA: Diagnosis not present

## 2024-09-07 DIAGNOSIS — M545 Low back pain, unspecified: Secondary | ICD-10-CM | POA: Diagnosis not present

## 2024-09-07 DIAGNOSIS — Z4789 Encounter for other orthopedic aftercare: Secondary | ICD-10-CM | POA: Diagnosis not present

## 2024-10-14 ENCOUNTER — Telehealth: Payer: Self-pay

## 2024-10-14 NOTE — Telephone Encounter (Signed)
 Tried reaching out to [facility] [place] with no answer. Called pt to make him aware that his Mybertiq was D/C and for him to take Trospium  as prescribe. Pt/Wife voiced understanding

## 2024-10-19 ENCOUNTER — Ambulatory Visit: Admitting: Internal Medicine

## 2024-10-21 ENCOUNTER — Ambulatory Visit: Admitting: Internal Medicine

## 2024-11-08 ENCOUNTER — Ambulatory Visit (INDEPENDENT_AMBULATORY_CARE_PROVIDER_SITE_OTHER)

## 2024-11-08 DIAGNOSIS — I442 Atrioventricular block, complete: Secondary | ICD-10-CM

## 2024-11-08 LAB — CUP PACEART REMOTE DEVICE CHECK
Date Time Interrogation Session: 20251230095556
Implantable Lead Connection Status: 753985
Implantable Lead Connection Status: 753985
Implantable Lead Implant Date: 20221222
Implantable Lead Implant Date: 20221222
Implantable Lead Location: 753858
Implantable Lead Location: 753859
Implantable Lead Model: 377171
Implantable Lead Model: 377171
Implantable Lead Serial Number: 8000640340
Implantable Lead Serial Number: 8000648576
Implantable Pulse Generator Implant Date: 20221222
Pulse Gen Model: 407145
Pulse Gen Serial Number: 70300615

## 2024-11-09 NOTE — Progress Notes (Signed)
 Remote PPM Transmission

## 2024-11-13 ENCOUNTER — Ambulatory Visit: Payer: Self-pay | Admitting: Student in an Organized Health Care Education/Training Program

## 2024-11-28 ENCOUNTER — Ambulatory Visit (HOSPITAL_COMMUNITY)
Admission: RE | Admit: 2024-11-28 | Discharge: 2024-11-28 | Disposition: A | Discharge status: Home / Self Care | Source: Ambulatory Visit | Attending: Pulmonary Disease | Admitting: Pulmonary Disease

## 2024-11-28 ENCOUNTER — Telehealth: Payer: Self-pay

## 2024-11-28 DIAGNOSIS — J849 Interstitial pulmonary disease, unspecified: Secondary | ICD-10-CM | POA: Insufficient documentation

## 2024-11-28 NOTE — Telephone Encounter (Signed)
 Copied from CRM [id number]. Topic: Appointments - Scheduling Inquiry for Clinic >> Nov 28, 2024 11:28 AM LaVerne A wrote: Reason for CRM: Patient's wife, Michaelle, called to see if patient's appointment on 12/05/24 can be schedule as a phone call.  Patient has fallen several times after surgery in June.  He has to use a walker or wheelchair to get around.  Please return her call at [phone] or cell phone at [phone].  Thanks.   Spoke with patient VBU, explain we do not do call visit changed her mind will keep appointment

## 2024-12-05 ENCOUNTER — Other Ambulatory Visit: Payer: Self-pay | Admitting: Urology

## 2024-12-05 ENCOUNTER — Ambulatory Visit: Admitting: Pulmonary Disease

## 2024-12-05 DIAGNOSIS — N401 Enlarged prostate with lower urinary tract symptoms: Secondary | ICD-10-CM

## 2024-12-06 ENCOUNTER — Ambulatory Visit: Payer: Self-pay | Admitting: Pulmonary Disease

## 2024-12-13 ENCOUNTER — Ambulatory Visit: Admitting: Pulmonary Disease

## 2024-12-14 ENCOUNTER — Telehealth: Payer: Self-pay

## 2024-12-14 NOTE — Telephone Encounter (Signed)
 Happy to see him.

## 2024-12-14 NOTE — Telephone Encounter (Signed)
Ok with transfer 

## 2024-12-14 NOTE — Telephone Encounter (Signed)
 Copied from CRM [id number]. Topic: Appointments - Transfer of Care >> Dec 12, 2024  9:42 AM Rilla B wrote: Pt is requesting to transfer FROM: Dr Theophilus Pt is requesting to transfer TO: Dr Catherine Reason for requested transfer: Cannot continue to drive to [facility] at his age. Would like to be seen in [place] It is the responsibility of the team the patient would like to transfer to (Dr. Catherine) to reach out to the patient if for any reason this transfer is not acceptable. *Would like a call from the office regarding his TOC when completed.

## 2024-12-14 NOTE — Telephone Encounter (Signed)
 I called and spoke with patient, advised him that it was fine for him to be seen at the [facility] office by Dr. Catherine.  He said his breathing is no worse than his last OV and he is ok waiting until April to be seen.  I scheduled him for 02/17/25 at 10 am, advised to arrive by 9:45 am for check in.  I let him know to call our office if he needs something sooner. He verbalized understanding.  He does use mychart and I made him aware that his appointment with location will come across his mychart account.  Nothing further needed.

## 2025-02-07 ENCOUNTER — Encounter

## 2025-02-08 ENCOUNTER — Ambulatory Visit: Admitting: Urology

## 2025-02-17 ENCOUNTER — Encounter: Admitting: Pulmonary Disease

## 2025-05-09 ENCOUNTER — Encounter

## 2025-08-08 ENCOUNTER — Encounter
